# Patient Record
Sex: Male | Born: 1940
Health system: Southern US, Community
[De-identification: ages and names within clinical notes are randomized; demographics above are authoritative.]

## PROBLEM LIST (undated history)

## (undated) DIAGNOSIS — M549 Dorsalgia, unspecified: Secondary | ICD-10-CM

## (undated) DIAGNOSIS — R51 Headache: Secondary | ICD-10-CM

## (undated) DIAGNOSIS — I10 Essential (primary) hypertension: Secondary | ICD-10-CM

## (undated) DIAGNOSIS — F17200 Nicotine dependence, unspecified, uncomplicated: Secondary | ICD-10-CM

## (undated) DIAGNOSIS — T148XXA Other injury of unspecified body region, initial encounter: Secondary | ICD-10-CM

## (undated) DIAGNOSIS — G5600 Carpal tunnel syndrome, unspecified upper limb: Secondary | ICD-10-CM

## (undated) DIAGNOSIS — K4090 Unilateral inguinal hernia, without obstruction or gangrene, not specified as recurrent: Secondary | ICD-10-CM

## (undated) DIAGNOSIS — I639 Cerebral infarction, unspecified: Secondary | ICD-10-CM

## (undated) DIAGNOSIS — C61 Malignant neoplasm of prostate: Secondary | ICD-10-CM

## (undated) DIAGNOSIS — N529 Male erectile dysfunction, unspecified: Secondary | ICD-10-CM

## (undated) DIAGNOSIS — Z86010 Personal history of colonic polyps: Secondary | ICD-10-CM

## (undated) DIAGNOSIS — I2510 Atherosclerotic heart disease of native coronary artery without angina pectoris: Secondary | ICD-10-CM

## (undated) DIAGNOSIS — R972 Elevated prostate specific antigen [PSA]: Secondary | ICD-10-CM

## (undated) HISTORY — DX: Carpal tunnel syndrome, unspecified upper limb: G56.00

## (undated) HISTORY — DX: Essential (primary) hypertension: I10

## (undated) HISTORY — DX: Nicotine dependence, unspecified, uncomplicated: F17.200

## (undated) HISTORY — DX: Elevated prostate specific antigen (PSA): R97.20

## (undated) HISTORY — DX: Headache: R51

## (undated) HISTORY — DX: Atherosclerotic heart disease of native coronary artery without angina pectoris: I25.10

## (undated) HISTORY — DX: Other injury of unspecified body region, initial encounter: T14.8XXA

## (undated) HISTORY — DX: Malignant neoplasm of prostate: C61

## (undated) HISTORY — DX: Personal history of colonic polyps: Z86.010

## (undated) HISTORY — DX: Dorsalgia, unspecified: M54.9

## (undated) HISTORY — DX: Cerebral infarction, unspecified: I63.9

## (undated) HISTORY — PX: HERNIA REPAIR: SHX51

## (undated) HISTORY — DX: Unilateral inguinal hernia, without obstruction or gangrene, not specified as recurrent: K40.90

## (undated) HISTORY — DX: Male erectile dysfunction, unspecified: N52.9

---

## 2004-10-04 ENCOUNTER — Ambulatory Visit: Payer: Self-pay | Admitting: Internal Medicine

## 2006-04-05 ENCOUNTER — Ambulatory Visit: Payer: Self-pay | Admitting: Internal Medicine

## 2006-07-18 ENCOUNTER — Ambulatory Visit: Payer: Self-pay | Admitting: Internal Medicine

## 2006-07-28 ENCOUNTER — Encounter: Payer: Self-pay | Admitting: Internal Medicine

## 2006-07-28 ENCOUNTER — Ambulatory Visit: Payer: Self-pay

## 2006-08-03 ENCOUNTER — Ambulatory Visit: Payer: Self-pay | Admitting: Gastroenterology

## 2006-08-17 ENCOUNTER — Encounter: Payer: Self-pay | Admitting: Internal Medicine

## 2006-08-17 ENCOUNTER — Ambulatory Visit: Payer: Self-pay | Admitting: Gastroenterology

## 2006-08-17 ENCOUNTER — Encounter (INDEPENDENT_AMBULATORY_CARE_PROVIDER_SITE_OTHER): Payer: Self-pay | Admitting: Specialist

## 2006-08-17 LAB — HM COLONOSCOPY

## 2006-10-06 ENCOUNTER — Ambulatory Visit: Payer: Self-pay | Admitting: Internal Medicine

## 2006-11-17 ENCOUNTER — Ambulatory Visit: Payer: Self-pay | Admitting: Internal Medicine

## 2007-01-08 ENCOUNTER — Ambulatory Visit: Payer: Self-pay | Admitting: Internal Medicine

## 2007-04-10 ENCOUNTER — Ambulatory Visit: Payer: Self-pay | Admitting: Internal Medicine

## 2007-05-29 ENCOUNTER — Telehealth (INDEPENDENT_AMBULATORY_CARE_PROVIDER_SITE_OTHER): Payer: Self-pay | Admitting: *Deleted

## 2007-06-08 ENCOUNTER — Encounter: Payer: Self-pay | Admitting: Internal Medicine

## 2007-06-08 DIAGNOSIS — I10 Essential (primary) hypertension: Secondary | ICD-10-CM

## 2007-06-08 DIAGNOSIS — Z860100 Personal history of colon polyps, unspecified: Secondary | ICD-10-CM

## 2007-06-08 HISTORY — DX: Personal history of colon polyps, unspecified: Z86.0100

## 2007-06-08 HISTORY — DX: Essential (primary) hypertension: I10

## 2007-06-08 HISTORY — DX: Personal history of colonic polyps: Z86.010

## 2007-07-11 ENCOUNTER — Ambulatory Visit: Payer: Self-pay | Admitting: Internal Medicine

## 2007-07-11 DIAGNOSIS — R51 Headache: Secondary | ICD-10-CM

## 2007-07-11 DIAGNOSIS — R519 Headache, unspecified: Secondary | ICD-10-CM | POA: Insufficient documentation

## 2007-07-11 DIAGNOSIS — N529 Male erectile dysfunction, unspecified: Secondary | ICD-10-CM

## 2007-07-11 HISTORY — DX: Male erectile dysfunction, unspecified: N52.9

## 2007-07-11 HISTORY — DX: Headache: R51

## 2007-11-12 ENCOUNTER — Ambulatory Visit: Payer: Self-pay | Admitting: Internal Medicine

## 2007-11-12 DIAGNOSIS — I2510 Atherosclerotic heart disease of native coronary artery without angina pectoris: Secondary | ICD-10-CM

## 2007-11-12 HISTORY — DX: Atherosclerotic heart disease of native coronary artery without angina pectoris: I25.10

## 2007-12-03 ENCOUNTER — Emergency Department (HOSPITAL_COMMUNITY): Admission: EM | Admit: 2007-12-03 | Discharge: 2007-12-03 | Payer: Self-pay | Admitting: Emergency Medicine

## 2007-12-03 ENCOUNTER — Telehealth: Payer: Self-pay | Admitting: Internal Medicine

## 2007-12-04 ENCOUNTER — Ambulatory Visit: Payer: Self-pay | Admitting: Internal Medicine

## 2007-12-04 DIAGNOSIS — K4090 Unilateral inguinal hernia, without obstruction or gangrene, not specified as recurrent: Secondary | ICD-10-CM

## 2007-12-04 HISTORY — DX: Unilateral inguinal hernia, without obstruction or gangrene, not specified as recurrent: K40.90

## 2007-12-05 ENCOUNTER — Encounter: Payer: Self-pay | Admitting: Internal Medicine

## 2007-12-05 ENCOUNTER — Encounter (INDEPENDENT_AMBULATORY_CARE_PROVIDER_SITE_OTHER): Payer: Self-pay | Admitting: General Surgery

## 2007-12-06 ENCOUNTER — Inpatient Hospital Stay (HOSPITAL_COMMUNITY): Admission: EM | Admit: 2007-12-06 | Discharge: 2007-12-13 | Payer: Self-pay | Admitting: Emergency Medicine

## 2008-04-24 ENCOUNTER — Encounter: Payer: Self-pay | Admitting: Internal Medicine

## 2008-04-25 ENCOUNTER — Ambulatory Visit: Payer: Self-pay | Admitting: Internal Medicine

## 2008-04-25 LAB — CONVERTED CEMR LAB: PSA: 3.04 ng/mL (ref 0.10–4.00)

## 2008-07-16 ENCOUNTER — Ambulatory Visit: Payer: Self-pay | Admitting: Internal Medicine

## 2008-07-16 DIAGNOSIS — M549 Dorsalgia, unspecified: Secondary | ICD-10-CM

## 2008-07-16 HISTORY — DX: Dorsalgia, unspecified: M54.9

## 2008-10-15 ENCOUNTER — Ambulatory Visit: Payer: Self-pay | Admitting: Internal Medicine

## 2008-10-15 DIAGNOSIS — G5600 Carpal tunnel syndrome, unspecified upper limb: Secondary | ICD-10-CM

## 2008-10-15 HISTORY — DX: Carpal tunnel syndrome, unspecified upper limb: G56.00

## 2009-01-14 ENCOUNTER — Telehealth: Payer: Self-pay | Admitting: Internal Medicine

## 2009-02-03 ENCOUNTER — Ambulatory Visit: Payer: Self-pay | Admitting: Internal Medicine

## 2009-04-30 ENCOUNTER — Encounter: Payer: Self-pay | Admitting: Internal Medicine

## 2009-05-01 ENCOUNTER — Ambulatory Visit: Payer: Self-pay | Admitting: Internal Medicine

## 2009-05-05 ENCOUNTER — Telehealth: Payer: Self-pay | Admitting: Internal Medicine

## 2009-05-05 LAB — CONVERTED CEMR LAB
ALT: 17 units/L (ref 0–53)
BUN: 11 mg/dL (ref 6–23)
Basophils Relative: 0.1 % (ref 0.0–3.0)
CO2: 33 meq/L — ABNORMAL HIGH (ref 19–32)
Chloride: 101 meq/L (ref 96–112)
Cholesterol: 182 mg/dL (ref 0–200)
Creatinine, Ser: 0.9 mg/dL (ref 0.4–1.5)
Eosinophils Absolute: 0.2 10*3/uL (ref 0.0–0.7)
Eosinophils Relative: 3.8 % (ref 0.0–5.0)
HCT: 41.9 % (ref 39.0–52.0)
LDL Cholesterol: 120 mg/dL — ABNORMAL HIGH (ref 0–99)
Lymphs Abs: 1.6 10*3/uL (ref 0.7–4.0)
MCHC: 34.9 g/dL (ref 30.0–36.0)
MCV: 90.6 fL (ref 78.0–100.0)
Monocytes Absolute: 0.4 10*3/uL (ref 0.1–1.0)
PSA: 5.64 ng/mL — ABNORMAL HIGH (ref 0.10–4.00)
Platelets: 175 10*3/uL (ref 150.0–400.0)
Potassium: 3.4 meq/L — ABNORMAL LOW (ref 3.5–5.1)
TSH: 1.2 microintl units/mL (ref 0.35–5.50)
Total Bilirubin: 1.4 mg/dL — ABNORMAL HIGH (ref 0.3–1.2)
Total Protein: 7.2 g/dL (ref 6.0–8.3)
Triglycerides: 95 mg/dL (ref 0.0–149.0)
WBC: 4.4 10*3/uL — ABNORMAL LOW (ref 4.5–10.5)

## 2009-05-06 ENCOUNTER — Encounter: Payer: Self-pay | Admitting: Internal Medicine

## 2009-05-06 DIAGNOSIS — R972 Elevated prostate specific antigen [PSA]: Secondary | ICD-10-CM

## 2009-05-06 HISTORY — DX: Elevated prostate specific antigen (PSA): R97.20

## 2009-05-29 ENCOUNTER — Encounter: Payer: Self-pay | Admitting: Internal Medicine

## 2009-07-09 ENCOUNTER — Encounter: Payer: Self-pay | Admitting: Internal Medicine

## 2009-07-10 ENCOUNTER — Ambulatory Visit (HOSPITAL_COMMUNITY): Admission: RE | Admit: 2009-07-10 | Discharge: 2009-07-10 | Payer: Self-pay | Admitting: Urology

## 2009-08-05 ENCOUNTER — Encounter: Payer: Self-pay | Admitting: Internal Medicine

## 2009-08-11 ENCOUNTER — Ambulatory Visit: Admission: RE | Admit: 2009-08-11 | Discharge: 2009-10-06 | Payer: Self-pay | Admitting: Radiation Oncology

## 2009-08-12 ENCOUNTER — Encounter (INDEPENDENT_AMBULATORY_CARE_PROVIDER_SITE_OTHER): Payer: Self-pay | Admitting: *Deleted

## 2009-09-14 ENCOUNTER — Encounter (INDEPENDENT_AMBULATORY_CARE_PROVIDER_SITE_OTHER): Payer: Self-pay | Admitting: Urology

## 2009-09-14 ENCOUNTER — Inpatient Hospital Stay (HOSPITAL_COMMUNITY): Admission: RE | Admit: 2009-09-14 | Discharge: 2009-09-17 | Payer: Self-pay | Admitting: Urology

## 2009-09-14 DIAGNOSIS — C61 Malignant neoplasm of prostate: Secondary | ICD-10-CM

## 2009-09-14 HISTORY — PX: PROSTATE SURGERY: SHX751

## 2009-09-14 HISTORY — DX: Malignant neoplasm of prostate: C61

## 2009-09-22 ENCOUNTER — Encounter (INDEPENDENT_AMBULATORY_CARE_PROVIDER_SITE_OTHER): Payer: Self-pay | Admitting: *Deleted

## 2009-11-06 ENCOUNTER — Ambulatory Visit: Payer: Self-pay | Admitting: Internal Medicine

## 2009-11-06 DIAGNOSIS — F17200 Nicotine dependence, unspecified, uncomplicated: Secondary | ICD-10-CM | POA: Insufficient documentation

## 2009-11-06 HISTORY — DX: Nicotine dependence, unspecified, uncomplicated: F17.200

## 2009-11-19 ENCOUNTER — Encounter: Payer: Self-pay | Admitting: Internal Medicine

## 2010-03-19 ENCOUNTER — Encounter: Payer: Self-pay | Admitting: Internal Medicine

## 2010-05-19 ENCOUNTER — Ambulatory Visit: Payer: Self-pay | Admitting: Internal Medicine

## 2010-05-19 DIAGNOSIS — C61 Malignant neoplasm of prostate: Secondary | ICD-10-CM

## 2010-05-19 HISTORY — DX: Malignant neoplasm of prostate: C61

## 2010-09-29 ENCOUNTER — Encounter: Payer: Self-pay | Admitting: Internal Medicine

## 2010-11-19 ENCOUNTER — Ambulatory Visit
Admission: RE | Admit: 2010-11-19 | Discharge: 2010-11-19 | Payer: Self-pay | Source: Home / Self Care | Attending: Internal Medicine | Admitting: Internal Medicine

## 2010-11-28 LAB — CONVERTED CEMR LAB
Albumin: 3.9 g/dL (ref 3.5–5.2)
Alkaline Phosphatase: 39 units/L (ref 39–117)
Basophils Absolute: 0 10*3/uL (ref 0.0–0.1)
CO2: 28 meq/L (ref 19–32)
Cholesterol: 175 mg/dL (ref 0–200)
Eosinophils Absolute: 0.2 10*3/uL (ref 0.0–0.7)
Glucose, Bld: 77 mg/dL (ref 70–99)
HCT: 43 % (ref 39.0–52.0)
Hemoglobin: 14.5 g/dL (ref 13.0–17.0)
Lymphs Abs: 1.5 10*3/uL (ref 0.7–4.0)
MCHC: 33.7 g/dL (ref 30.0–36.0)
MCV: 92.3 fL (ref 78.0–100.0)
Monocytes Absolute: 0.5 10*3/uL (ref 0.1–1.0)
Neutro Abs: 3 10*3/uL (ref 1.4–7.7)
Potassium: 3.9 meq/L (ref 3.5–5.1)
RDW: 16 % — ABNORMAL HIGH (ref 11.5–14.6)
Sodium: 136 meq/L (ref 135–145)
Total Protein: 6.8 g/dL (ref 6.0–8.3)

## 2010-11-30 NOTE — Letter (Signed)
Summary: [facility]  [facility]   Imported By: Maryln Gottron 03/30/2010 13:55:56  _____________________________________________________________________  External Attachment:    Type:   Image     Comment:   External Document

## 2010-11-30 NOTE — Letter (Signed)
Summary: [facility]  [facility]   Imported By: Maryln Gottron 10/05/2010 11:16:20  _____________________________________________________________________  External Attachment:    Type:   Image     Comment:   External Document

## 2010-11-30 NOTE — Assessment & Plan Note (Signed)
Summary: PT WILL COME IN FASTING/[facility] RSC BMP OK PER DAUGH CAMMIE/[facility]   Vital Signs:  Patient profile:   70 year old male Height:      72.5 inches Weight:      220 pounds BMI:     29.53 Temp:     98.4 degrees F oral BP sitting:   130 / 80  (right arm) Cuff size:   regular  Vitals Entered By: Duard Brady LPN (May 19, 2010 9:28 AM) CC: cpx- doing well Is Patient Diabetic? No   CC:  cpx- doing well.  History of Present Illness:   70 year old patient who is seen today for a comprehensive evaluation.  Medical problems include a history of LV dysfunction.  He has hypertension, colonic polyps, and a history of ongoing tobacco use.  He has cut his tobacco consumption down considerably after being treated with chanix  earlier in the year.  denies any symptoms of congestive heart failure.  No exertional chest pain.  He has decreased his tobacco use, to just a few cigarettes daily.  Total cessation discussed and encouraged. Here for Medicare AWV:  1.   Risk factors based on Past M, S, F history: patient has a history of LV dysfunction, hypertension, and tobacco use 2.   Physical Activities: no limitations 3.   Depression/mood: no history of depression or mood disorder 4.   Hearing: no hearing deficits 5.   ADL's: remains independent in all aspects of daily living 6.   Fall Risk: low 7.   Home Safety: no problems identified 8.   Height, weight, &visual acuity:height and weight are stable.  No difficulty with visual acuity 9.   Counseling: total cessation of all tobacco products.  Encouraged 10.   Labs ordered based on risk factors: laboratory studies, including PSA will be reviewed 11.           Referral Coordination-followed urology.  This fall 12.           Care Plan- heart healthy, salt restricted diet more regular exercise and modest weight loss.  All encouraged 13.            Cognitive Assessment- alert and oriented, with normal affect   Preventive Screening-Counseling &  Management  Alcohol-Tobacco     Smoking Status: current     Smoking Cessation Counseling: yes  Allergies (verified): No Known Drug Allergies  Past History:  Past Medical History: Reviewed history from 11/06/2009 and no changes required. Colonic polyps, hx of Hypertension ED LVD R inguinal hernia-repaired prostate cancer  Past Surgical History: right inguinal hernia repair- 2009  Colonoscopy October2007 Cardiolite  stress test September 2007 status post radical prostatectomy November 2010 (T1C NO MO)  Family History: Reviewed history from 05/01/2009 and no changes required. details of his father's health unknown.   Mother died age 80, cancer, unclear type  III sisters, one deceased, age 77 cancer, unclear type; another died Ca-leukemia one brother died-trauma and secondary infection  Social History: Reviewed history from 04/25/2008 and no changes required. 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  120/78overweight-appearing.   Head:  Normocephalic and atraumatic without obvious abnormalities. No apparent alopecia or balding. Eyes:  No corneal or conjunctival inflammation noted. EOMI. Perrla. Funduscopic exam benign, without hemorrhages, exudates or papilledema. Vision grossly normal. arcus senilis Ears:  External ear exam shows no significant lesions or deformities.  Otoscopic examination reveals clear canals, tympanic membranes are intact bilaterally without bulging, retraction,  inflammation or discharge. Hearing is grossly normal bilaterally. Nose:  External nasal examination shows no deformity or inflammation. Nasal mucosa are pink and moist without lesions or exudates. Mouth:  Oral mucosa and oropharynx without lesions or exudates.  Teeth in good repair. Neck:  No deformities, masses, or tenderness noted. Chest Wall:  No deformities, masses, tenderness or gynecomastia  noted. Breasts:  No masses or gynecomastia noted Lungs:  Normal respiratory effort, chest expands symmetrically. Lungs are clear to auscultation, no crackles or wheezes. Heart:  Normal rate and regular rhythm. S1 and S2 normal without gallop, murmur, click, rub or other extra sounds. Abdomen:  Bowel sounds positive,abdomen soft and non-tender without masses, organomegaly or hernias noted. Rectal:  No external abnormalities noted. Normal sphincter tone. No rectal masses or tenderness. Genitalia:  Testes bilaterally descended without nodularity, tenderness or masses. No scrotal masses or lesions. No penis lesions or urethral discharge. Prostate:  status post prostatectomy Msk:  No deformity or scoliosis noted of thoracic or lumbar spine.   Pulses:  R and L carotid,radial,femoral,dorsalis pedis and posterior tibial pulses are full and equal bilaterally Extremities:  No clubbing, cyanosis, edema, or deformity noted with normal full range of motion of all joints.   Neurologic:  No cranial nerve deficits noted. Station and gait are normal. Plantar reflexes are down-going bilaterally. DTRs are symmetrical throughout. Sensory, motor and coordinative functions appear intact. Skin:  Intact without suspicious lesions or rashes Cervical Nodes:  No lymphadenopathy noted Axillary Nodes:  No palpable lymphadenopathy Inguinal Nodes:  No significant adenopathy Psych:  Cognition and judgment appear intact. Alert and cooperative with normal attention span and concentration. No apparent delusions, illusions, hallucinations   Impression & Recommendations:  Problem # 1:  Preventive Health Care (ICD-V70.0)  Orders: First annual wellness visit with prevention plan  (U9323)  Problem # 2:  TOBACCO ABUSE (ICD-305.1)  His updated medication list for this problem includes:    Chantix Starting Month Pak 0.5 Mg X 11 & 1 Mg X 42 Tabs (Varenicline tartrate)    Chantix 1 Mg Tabs (Varenicline tartrate) ..... One tablet  twice daily  His updated medication list for this problem includes:    Chantix Starting Month Pak 0.5 Mg X 11 & 1 Mg X 42 Tabs (Varenicline tartrate)    Chantix 1 Mg Tabs (Varenicline tartrate) ..... One tablet twice daily  Problem # 3:  LEFT VENTRICULAR FUNCTION, DECREASED (ICD-429.2)  His updated medication list for this problem includes:    Benazepril Hcl 40 Mg Tabs (Benazepril hcl) .Marland Kitchen... 1 once daily    Chlorthalidone 25 Mg Tabs (Chlorthalidone) .Marland Kitchen... 1 once daily    Toprol Xl 50 Mg Tb24 (Metoprolol succinate) .Marland Kitchen... 1 once daily  His updated medication list for this problem includes:    Benazepril Hcl 40 Mg Tabs (Benazepril hcl) .Marland Kitchen... 1 once daily    Chlorthalidone 25 Mg Tabs (Chlorthalidone) .Marland Kitchen... 1 once daily    Toprol Xl 50 Mg Tb24 (Metoprolol succinate) .Marland Kitchen... 1 once daily  Problem # 4:  HYPERTENSION (ICD-401.9)  His updated medication list for this problem includes:    Benazepril Hcl 40 Mg Tabs (Benazepril hcl) .Marland Kitchen... 1 once daily    Chlorthalidone 25 Mg Tabs (Chlorthalidone) .Marland Kitchen... 1 once daily    Toprol Xl 50 Mg Tb24 (Metoprolol succinate) .Marland Kitchen... 1 once daily  Orders: EKG w/ Interpretation (93000) Venipuncture (55732) TLB-BMP (Basic Metabolic Panel-BMET) (80048-METABOL) TLB-CBC Platelet - w/Differential (85025-CBCD) TLB-Hepatic/Liver Function Pnl (80076-HEPATIC)  His updated medication list for this problem includes:  Benazepril Hcl 40 Mg Tabs (Benazepril hcl) .Marland Kitchen... 1 once daily    Chlorthalidone 25 Mg Tabs (Chlorthalidone) .Marland Kitchen... 1 once daily    Toprol Xl 50 Mg Tb24 (Metoprolol succinate) .Marland Kitchen... 1 once daily  Complete Medication List: 1)  Benazepril Hcl 40 Mg Tabs (Benazepril hcl) .Marland Kitchen.. 1 once daily 2)  Chlorthalidone 25 Mg Tabs (Chlorthalidone) .Marland Kitchen.. 1 once daily 3)  Toprol Xl 50 Mg Tb24 (Metoprolol succinate) .Marland Kitchen.. 1 once daily 4)  Viagra 100 Mg Tabs (Sildenafil citrate) .... One daily as directed 5)  Chantix Starting Month Pak 0.5 Mg X 11 & 1 Mg X 42 Tabs  (Varenicline tartrate) 6)  Chantix 1 Mg Tabs (Varenicline tartrate) .... One tablet twice daily  Other Orders: Specimen Handling (33007) TLB-Lipid Panel (80061-LIPID) TLB-TSH (Thyroid Stimulating Hormone) (84443-TSH) TLB-PSA (Prostate Specific Antigen) (84153-PSA)  Patient Instructions: 1)  Please schedule a follow-up appointment in 6 months. 2)  Limit your Sodium (Salt). 3)  Tobacco is very bad for your health and your loved ones! You Should stop smoking!. 4)  It is important that you exercise regularly at least 20 minutes 5 times a week. If you develop chest pain, have severe difficulty breathing, or feel very tired , stop exercising immediately and seek medical attention. 5)  You need to lose weight. Consider a lower calorie diet and regular exercise.  Prescriptions: VIAGRA 100 MG TABS (SILDENAFIL CITRATE) one daily as directed  #6 x 6   Entered and Authorized by:   Gordy Savers  MD   Signed by:   Gordy Savers  MD on 05/19/2010   Method used:   Electronically to        [facility] [phone]* (retail)       [address]       [place], [place]  [address]       Ph: [phone]       Fax: [phone]   RxID:   [id number] TOPROL XL 50 MG  TB24 (METOPROLOL SUCCINATE) 1 once daily  #90 x 6   Entered and Authorized by:   Gordy Savers  MD   Signed by:   Gordy Savers  MD on 05/19/2010   Method used:   Electronically to        [facility] [phone]* (retail)       [address]       [place], [place]  [address]       Ph: [phone]       Fax: [phone]   RxID:   [id number] CHLORTHALIDONE 25 MG  TABS (CHLORTHALIDONE) 1 once daily  #90 x 6   Entered and Authorized by:   Gordy Savers  MD   Signed by:   Gordy Savers  MD on 05/19/2010   Method used:   Electronically to        [facility] [phone]* (retail)       [address]       [place], [place]  [address]       Ph: [phone]       Fax: [phone]   RxID:    [id number] BENAZEPRIL HCL 40 MG  TABS (BENAZEPRIL HCL) 1 once daily  #90 x 6   Entered and Authorized by:   Gordy Savers  MD   Signed by:   Gordy Savers  MD on 05/19/2010   Method used:   Electronically to        [facility]  [address]* (retail)       [address]       [place], [place]  [address]       Ph: [phone]       Fax: [phone]   RxID:   [id number]

## 2010-11-30 NOTE — Letter (Signed)
Summary: [facility]  [facility]   Imported By: Maryln Gottron 11/27/2009 12:25:18  _____________________________________________________________________  External Attachment:    Type:   Image     Comment:   External Document

## 2010-11-30 NOTE — Assessment & Plan Note (Signed)
Summary: 6 month rov/njr   the  Vital Signs:  Patient profile:   70 year old male Weight:      213 pounds Temp:     98.8 degrees F oral BP sitting:   116 / 68  (left arm) Cuff size:   regular  Vitals Entered By: Raechel Ache, RN (November 06, 2009 10:15 AM) CC: 6 mo ROV- had colon surgery 2 weeks, c/o constipation   CC:  6 mo ROV- had colon surgery 2 weeks and c/o constipation.  History of Present Illness: 70 year old patient who is seen today for follow up.  She has a history of LV dysfunction, hypertension, ongoing tobacco use.  Since his last visit here.  He has had a radical prostatectomy.  His cardiac status has been stable.  His only complaint today is some occasional constipation.  This has a well-controlled with a stool softener.  He denies any cardiopulmonary complaints.  He is agreeable to a smoking cessation program.  Preventive Screening-Counseling & Management  Alcohol-Tobacco     Smoking Cessation Counseling: yes  Allergies: No Known Drug Allergies  Past History:  Past Medical History: Colonic polyps, hx of Hypertension ED LVD R inguinal hernia-repaired prostate cancer  Past Surgical History: right inguinal hernia repair- 2009  Colonoscopy October2007 Cardiolite  stress test September 2007status post radical prostatectomy November 2010 (T1C NO MO)  Review of Systems  The patient denies anorexia, fever, weight loss, weight gain, vision loss, decreased hearing, hoarseness, chest pain, syncope, dyspnea on exertion, peripheral edema, prolonged cough, headaches, hemoptysis, abdominal pain, melena, hematochezia, severe indigestion/heartburn, hematuria, incontinence, genital sores, muscle weakness, suspicious skin lesions, transient blindness, difficulty walking, depression, unusual weight change, abnormal bleeding, enlarged lymph nodes, angioedema, breast masses, and testicular masses.    Physical Exam  General:  Well-developed,well-nourished,in no acute  distress; alert,appropriate and cooperative throughout examination Head:  Normocephalic and atraumatic without obvious abnormalities. No apparent alopecia or balding. Eyes:  No corneal or conjunctival inflammation noted. EOMI. Perrla. Funduscopic exam benign, without hemorrhages, exudates or papilledema. Vision grossly normal. Mouth:  Oral mucosa and oropharynx without lesions or exudates.  Teeth in good repair. Neck:  No deformities, masses, or tenderness noted. Lungs:  Normal respiratory effort, chest expands symmetrically. Lungs are clear to auscultation, no crackles or wheezes. Heart:  Normal rate and regular rhythm. S1 and S2 normal without gallop, murmur, click, rub or other extra sounds. Abdomen:  Bowel sounds positive,abdomen soft and non-tender without masses, organomegaly or hernias noted. Msk:  No deformity or scoliosis noted of thoracic or lumbar spine.   Extremities:   no edema Skin:  Intact without suspicious lesions or rashes Cervical Nodes:  No lymphadenopathy noted   Impression & Recommendations:  Problem # 1:  LEFT VENTRICULAR FUNCTION, DECREASED (ICD-429.2)  His updated medication list for this problem includes:    Benazepril Hcl 40 Mg Tabs (Benazepril hcl) .Marland Kitchen... 1 once daily    Chlorthalidone 25 Mg Tabs (Chlorthalidone) .Marland Kitchen... 1 once daily    Toprol Xl 50 Mg Tb24 (Metoprolol succinate) .Marland Kitchen... 1 once daily    His updated medication list for this problem includes:    Benazepril Hcl 40 Mg Tabs (Benazepril hcl) .Marland Kitchen... 1 once daily    Chlorthalidone 25 Mg Tabs (Chlorthalidone) .Marland Kitchen... 1 once daily    Toprol Xl 50 Mg Tb24 (Metoprolol succinate) .Marland Kitchen... 1 once daily  Orders: Prescription Created Electronically [phone])  Problem # 2:  HYPERTENSION (ICD-401.9)  His updated medication list for this problem includes:  Benazepril Hcl 40 Mg Tabs (Benazepril hcl) .Marland Kitchen... 1 once daily    Chlorthalidone 25 Mg Tabs (Chlorthalidone) .Marland Kitchen... 1 once daily    Toprol Xl 50 Mg Tb24  (Metoprolol succinate) .Marland Kitchen... 1 once daily    His updated medication list for this problem includes:    Benazepril Hcl 40 Mg Tabs (Benazepril hcl) .Marland Kitchen... 1 once daily    Chlorthalidone 25 Mg Tabs (Chlorthalidone) .Marland Kitchen... 1 once daily    Toprol Xl 50 Mg Tb24 (Metoprolol succinate) .Marland Kitchen... 1 once daily  Orders: Prescription Created Electronically [phone])  Problem # 3:  TOBACCO ABUSE (ICD-305.1)  His updated medication list for this problem includes:    Chantix Starting Month Pak 0.5 Mg X 11 & 1 Mg X 42 Tabs (Varenicline tartrate)    Chantix 1 Mg Tabs (Varenicline tartrate) ..... One tablet twice daily  His updated medication list for this problem includes:    Chantix Starting Month Pak 0.5 Mg X 11 & 1 Mg X 42 Tabs (Varenicline tartrate)    Chantix 1 Mg Tabs (Varenicline tartrate) ..... One tablet twice daily  Complete Medication List: 1)  Benazepril Hcl 40 Mg Tabs (Benazepril hcl) .Marland Kitchen.. 1 once daily 2)  Chlorthalidone 25 Mg Tabs (Chlorthalidone) .Marland Kitchen.. 1 once daily 3)  Toprol Xl 50 Mg Tb24 (Metoprolol succinate) .Marland Kitchen.. 1 once daily 4)  Viagra 100 Mg Tabs (Sildenafil citrate) .... One daily as directed 5)  Chantix Starting Month Pak 0.5 Mg X 11 & 1 Mg X 42 Tabs (Varenicline tartrate) 6)  Chantix 1 Mg Tabs (Varenicline tartrate) .... One tablet twice daily  Patient Instructions: 1)  Please schedule a follow-up appointment in 6 months. 2)  Limit your Sodium (Salt). 3)  Tobacco is very bad for your health and your loved ones! You Should stop smoking!. 4)  It is important that you exercise regularly at least 20 minutes 5 times a week. If you develop chest pain, have severe difficulty breathing, or feel very tired , stop exercising immediately and seek medical attention. 5)  You need to lose weight. Consider a lower calorie diet and regular exercise.  Prescriptions: CHANTIX 1 MG TABS (VARENICLINE TARTRATE) one tablet twice daily  #60 x 2   Entered and Authorized by:   Gordy Savers  MD    Signed by:   Gordy Savers  MD on 11/06/2009   Method used:   Electronically to        [facility] [phone]* (retail)       [address]       [place], [place]  [address]       Ph: [phone]       Fax: [phone]   RxID:   [id number] CHANTIX STARTING MONTH PAK 0.5 MG X 11 & 1 MG X 42 TABS (VARENICLINE TARTRATE)   #one month x 0   Entered and Authorized by:   Gordy Savers  MD   Signed by:   Gordy Savers  MD on 11/06/2009   Method used:   Print then Give to Patient   RxID:   [id number] TOPROL XL 50 MG  TB24 (METOPROLOL SUCCINATE) 1 once daily  #90 x 6   Entered and Authorized by:   Gordy Savers  MD   Signed by:   Gordy Savers  MD on 11/06/2009   Method used:   Electronically to        [facility] [phone]* (retail)       [address]       [place], [place]  [address]       Ph: [phone]       Fax: [phone]   RxID:   [id number] CHLORTHALIDONE 25 MG  TABS (CHLORTHALIDONE) 1 once daily  #90 x 6   Entered and Authorized by:   Gordy Savers  MD   Signed by:   Gordy Savers  MD on 11/06/2009   Method used:   Electronically to        [facility] [phone]* (retail)       [address]       [place], [place]  [address]       Ph: [phone]       Fax: [phone]   RxID:   [id number] BENAZEPRIL HCL 40 MG  TABS (BENAZEPRIL HCL) 1 once daily  #90 x 6   Entered and Authorized by:   Gordy Savers  MD   Signed by:   Gordy Savers  MD on 11/06/2009   Method used:   Electronically to        [facility] [phone]* (retail)       [address]       [place], [place]  [address]       Ph: [phone]       Fax: [phone]   RxID:   [id number] CHANTIX 1 MG TABS (VARENICLINE TARTRATE) one tablet twice daily  #60 x 2   Entered and Authorized by:   Gordy Savers  MD   Signed by:   Gordy Savers  MD on 11/06/2009   Method used:   Print then Give to Patient   RxID:    [id number] CHANTIX STARTING MONTH PAK 0.5 MG X 11 & 1 MG X 42 TABS (VARENICLINE TARTRATE)   #one month x 0   Entered and Authorized by:   Gordy Savers  MD   Signed by:   Gordy Savers  MD on 11/06/2009   Method used:   Print then Give to Patient   RxID:   [id number] TOPROL XL 50 MG  TB24 (METOPROLOL SUCCINATE) 1 once daily  #90 x 6   Entered and Authorized by:   Gordy Savers  MD   Signed by:   Gordy Savers  MD on 11/06/2009   Method used:   Print then Give to Patient   RxID:   [id number] CHLORTHALIDONE 25 MG  TABS (CHLORTHALIDONE) 1 once daily  #90 x 6   Entered and Authorized by:   Gordy Savers  MD   Signed by:   Gordy Savers  MD on 11/06/2009   Method used:   Print then Give to Patient   RxID:   [id number] BENAZEPRIL HCL 40 MG  TABS (BENAZEPRIL HCL) 1 once daily  #90 x 6   Entered and Authorized by:   Gordy Savers  MD   Signed by:   Gordy Savers  MD on 11/06/2009   Method used:   Print then Give to Patient   RxID:   [id number]

## 2010-12-02 NOTE — Assessment & Plan Note (Signed)
Summary: 6 month follow up/cjr   Vital Signs:  Patient profile:   70 year old male Weight:      221 pounds Temp:     98.1 degrees F oral BP sitting:   130 / 80  (right arm) Cuff size:   regular  Vitals Entered By: Duard Brady LPN (November 19, 2010 9:48 AM) CC: 6 mos rov - doing well Is Patient Diabetic? No   CC:  6 mos rov - doing well.  History of Present Illness: 65 -year-old patient who is seen today for follow-up of hypertension.  he does well on triple therapy.  He tolerates his medications well and has had nice blood pressure control.  He is seen today for his 6 month follow-up unfortunately, he continues to smoke.  This was discussed at length, and he seems motivated to discontinue.  Preventive Screening-Counseling & Management  Alcohol-Tobacco     Smoking Status: current  Allergies (verified): No Known Drug Allergies  Past History:  Past Medical History: Reviewed history from 11/06/2009 and no changes required. Colonic polyps, hx of Hypertension ED LVD R inguinal hernia-repaired prostate cancer  Past Surgical History: Reviewed history from 05/19/2010 and no changes required. right inguinal hernia repair- 2009  Colonoscopy October2007 Cardiolite  stress test September 2007 status post radical prostatectomy November 2010 (T1C NO MO)  Family History: Reviewed history from 05/01/2009 and no changes required. details of his father's health unknown.   Mother died age 59, cancer, unclear type  III sisters, one deceased, age 25 cancer, unclear type; another died Ca-leukemia one brother died-trauma and secondary infection  Social History: Reviewed history from 04/25/2008 and no changes required. 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    Physical Exam  General:  overweight-appearing.  130/80overweight-appearing.   Head:  Normocephalic and atraumatic without obvious abnormalities. No apparent alopecia or balding. Eyes:  No corneal or conjunctival inflammation noted. EOMI. Perrla. Funduscopic exam benign, without hemorrhages, exudates or papilledema. Vision grossly normal. Mouth:  Oral mucosa and oropharynx without lesions or exudates.  Teeth in good repair. Neck:  No deformities, masses, or tenderness noted. Lungs:  Normal respiratory effort, chest expands symmetrically. Lungs are clear to auscultation, no crackles or wheezes. Heart:  Normal rate and regular rhythm. S1 and S2 normal without gallop, murmur, click, rub or other extra sounds. Abdomen:  Bowel sounds positive,abdomen soft and non-tender without masses, organomegaly or hernias noted. Msk:  No deformity or scoliosis noted of thoracic or lumbar spine.   Pulses:  R and L carotid,radial,femoral,dorsalis pedis and posterior tibial pulses are full and equal bilaterally Extremities:  No clubbing, cyanosis, edema, or deformity noted with normal full range of motion of all joints.   Skin:  Intact without suspicious lesions or rashes Cervical Nodes:  No lymphadenopathy noted   Impression & Recommendations:  Problem # 1:  TOBACCO ABUSE (ICD-305.1)  The following medications were removed from the medication list:    Chantix Starting Month Pak 0.5 Mg X 11 & 1 Mg X 42 Tabs (Varenicline tartrate)    Chantix 1 Mg Tabs (Varenicline tartrate) ..... One tablet  twice daily  The following medications were removed from the  medication list:    Chantix Starting Month Pak 0.5 Mg X 11 & 1 Mg X 42 Tabs (Varenicline tartrate)    Chantix 1 Mg Tabs (Varenicline tartrate) ..... One tablet twice daily  Problem # 2:  HYPERTENSION (ICD-401.9)  His updated medication list for this problem includes:    Benazepril Hcl 40 Mg Tabs (Benazepril hcl) .Marland Kitchen... 1 once daily    Chlorthalidone 25 Mg Tabs (Chlorthalidone) .Marland Kitchen... 1 once daily    Toprol Xl 50 Mg Tb24 (Metoprolol succinate) .Marland Kitchen... 1 once daily  His updated medication list for this problem includes:    Benazepril Hcl 40 Mg Tabs (Benazepril hcl) .Marland Kitchen... 1 once daily    Chlorthalidone 25 Mg Tabs (Chlorthalidone) .Marland Kitchen... 1 once daily    Toprol Xl 50 Mg Tb24 (Metoprolol succinate) .Marland Kitchen... 1 once daily  Complete Medication List: 1)  Benazepril Hcl 40 Mg Tabs (Benazepril hcl) .Marland Kitchen.. 1 once daily 2)  Chlorthalidone 25 Mg Tabs (Chlorthalidone) .Marland Kitchen.. 1 once daily 3)  Toprol Xl 50 Mg Tb24 (Metoprolol succinate) .Marland Kitchen.. 1 once daily 4)  Viagra 100 Mg Tabs (Sildenafil citrate) .... One daily as directed  Patient Instructions: 1)  Please schedule a follow-up appointment in 6 months. 2)  Advised not to eat any food or drink any liquids after 10 PM the night before your procedure. 3)  Limit your Sodium (Salt) to less than 2 grams a day(slightly less than 1/2 a teaspoon) to prevent fluid retention, swelling, or worsening of symptoms. 4)  It is important that you exercise regularly at least 20 minutes 5 times a week. If you develop chest pain, have severe difficulty breathing, or feel very tired , stop exercising immediately and seek medical attention. 5)  You need to lose weight. Consider a lower calorie diet and regular exercise.  6)  Check your Blood Pressure regularly. If it is above:   150/90 consistently, you should make an appointment. Prescriptions: VIAGRA 100 MG TABS (SILDENAFIL CITRATE) one daily as directed  #6 x 6   Entered and Authorized by:   Gordy Savers  MD   Signed by:    Gordy Savers  MD on 11/19/2010   Method used:   Electronically to        [facility] [phone]* (retail)       [address]       [place], [place]  [address]       Ph: [phone]       Fax: [phone]   RxID:   [id number] TOPROL XL 50 MG  TB24 (METOPROLOL SUCCINATE) 1 once daily  #90 x 6   Entered and Authorized by:   Gordy Savers  MD   Signed by:   Gordy Savers  MD on 11/19/2010   Method used:   Electronically to        [facility] [phone]* (retail)       [address]       [place], [place]  [address]       Ph: [phone]       Fax: [phone]   RxID:   [id number] CHLORTHALIDONE 25 MG  TABS (CHLORTHALIDONE) 1 once daily  #90 x 6   Entered and Authorized by:   Gordy Savers  MD   Signed by:   Gordy Savers  MD on 11/19/2010   Method used:   Electronically to  [facility] [address] [phone]* (retail)       [address]       [place], [place]  [address]       Ph: [phone]       Fax: [phone]   RxID:   [id number] BENAZEPRIL HCL 40 MG  TABS (BENAZEPRIL HCL) 1 once daily  #90 x 6   Entered and Authorized by:   Gordy Savers  MD   Signed by:   Gordy Savers  MD on 11/19/2010   Method used:   Electronically to        [facility] [phone]* (retail)       [address]       [place], [place]  [address]       Ph: [phone]       Fax: [phone]   RxID:   [id number]    Orders Added: 1)  Est. Patient Level III [[address]]

## 2011-02-02 LAB — HEMOGLOBIN AND HEMATOCRIT, BLOOD
HCT: 39.1 % (ref 39.0–52.0)
Hemoglobin: 12.9 g/dL — ABNORMAL LOW (ref 13.0–17.0)

## 2011-02-02 LAB — TYPE AND SCREEN

## 2011-02-02 LAB — BASIC METABOLIC PANEL
CO2: 27 mEq/L (ref 19–32)
CO2: 27 mEq/L (ref 19–32)
Calcium: 8.3 mg/dL — ABNORMAL LOW (ref 8.4–10.5)
Chloride: 99 mEq/L (ref 96–112)
Chloride: 100 mEq/L (ref 96–112)
Creatinine, Ser: 1.46 mg/dL (ref 0.4–1.5)
GFR calc Af Amer: 58 mL/min — ABNORMAL LOW (ref >60)
GFR calc Af Amer: >60 mL/min (ref >60)
Glucose, Bld: 123 mg/dL — ABNORMAL HIGH (ref 70–99)
Potassium: 3.4 mEq/L — ABNORMAL LOW (ref 3.5–5.1)
Sodium: 134 mEq/L — ABNORMAL LOW (ref 135–145)
Sodium: 136 mEq/L (ref 135–145)
Sodium: 139 mEq/L (ref 135–145)

## 2011-02-02 LAB — CBC
HCT: 46.2 % (ref 39.0–52.0)
Hemoglobin: 15.7 g/dL (ref 13.0–17.0)
MCV: 90.9 fL (ref 78.0–100.0)
RBC: 5.08 MIL/uL (ref 4.22–5.81)
WBC: 4.9 10*3/uL (ref 4.0–10.5)

## 2011-02-02 LAB — CREATININE, FLUID (PLEURAL, PERITONEAL, JP DRAINAGE)

## 2011-02-08 ENCOUNTER — Encounter: Payer: Self-pay | Admitting: Internal Medicine

## 2011-02-09 ENCOUNTER — Ambulatory Visit (INDEPENDENT_AMBULATORY_CARE_PROVIDER_SITE_OTHER): Payer: Medicare Other | Admitting: Internal Medicine

## 2011-02-09 ENCOUNTER — Encounter: Payer: Self-pay | Admitting: Internal Medicine

## 2011-02-09 VITALS — BP 130/88 | Temp 98.1°F | Wt 226.0 lb

## 2011-02-09 DIAGNOSIS — M25519 Pain in unspecified shoulder: Secondary | ICD-10-CM

## 2011-02-09 DIAGNOSIS — I10 Essential (primary) hypertension: Secondary | ICD-10-CM

## 2011-02-09 DIAGNOSIS — M25511 Pain in right shoulder: Secondary | ICD-10-CM

## 2011-02-09 DIAGNOSIS — M549 Dorsalgia, unspecified: Secondary | ICD-10-CM

## 2011-02-09 MED ORDER — TRAMADOL HCL 50 MG PO TABS: 50.0000 mg | ORAL_TABLET | Freq: Four times a day (QID) | ORAL | Status: DC | PRN

## 2011-02-09 MED ORDER — METHYLPREDNISOLONE ACETATE 80 MG/ML IJ SUSP
80.0000 mg | Freq: Once | INTRAMUSCULAR | Status: AC
  Administered 2011-02-09: 80 mg via INTRAMUSCULAR

## 2011-02-09 NOTE — Progress Notes (Signed)
  Subjective:    Patient ID: Tommy Bauer, male    DOB: June 18, 1941, 70 y.o.   MRN: [id number]  HPI  70 year old patient who presents with a chief complaint of right shoulder pain. This has been present for approximately one week. 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 He also describes some numbness of both hands. His main complaint is shoulder pain and inability to raise his hand up over the shoulder region there's been no trauma except for his overuse   Review of Systems  Musculoskeletal: Positive for back pain.       Objective:   Physical Exam  Musculoskeletal:       Slight tenderness over the anterior right shoulder region. Was not able to abduct the shoulder past 90 on the right without discomfort          Assessment & Plan:  Right shoulder pain. Probable shoulder bursitis. Will treat with 80 of Depo-Medrol and observed. He was given samples of Ultram when necessary pain

## 2011-02-09 NOTE — Patient Instructions (Signed)
Take Aleve 200 mg twice daily for pain or swelling  Call or return to clinic prn if these symptoms worsen or fail to improve as anticipated.  

## 2011-03-15 NOTE — H&P (Signed)
NAME:  HADES, MATHEW NO.:  [id number]   MEDICAL RECORD NO.:  [id number]          PATIENT TYPE:  INP   LOCATION:  1605                         FACILITY:  [facility]   PHYSICIAN:  Anselm Pancoast. Weatherly, M.D.DATE OF BIRTH:  12/15/1940   DATE OF ADMISSION:  12/05/2007  DATE OF DISCHARGE:                              HISTORY & PHYSICAL   CHIEF COMPLAINT:  Right inguinal hernia, nausea and vomiting.   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  He  was seen in the emergency room, and the hernia was  supposedly reduced  by the ER physician, and the patient given [facility]'s  number to call for an appointment.  He has had a known right inguinal  hernia for approximately 5 years, and was supposed to get this fixed.  He sees Dr. Eleonore Chiquito for his chronic care, mild hypertension,  and the hernia basically has not been that symptomatic and nothing was  recommended.   He started having pain on Monday, and went to the [facility]  Emergency Room, and was seen by the ER physician or PA; and the patient  states that he felt better when he  originally went home.  He was given  some pain medication, and he call for an appointment.  He started having  problems with increasing pain and distention the following day, and it  appears that when he was seen in the emergency room his vital signs were  stable.  O2 saturation was fine, and it was thought that the hernia was  reduced.  No x-rays were obtained and no lab studies, I do not think,  were obtained.  He was given Hydrocodone and a followup appointment with  his regular medical doctor, it says, and then also an appointment with  [facility].   The patient has had nausea and vomiting, and was seen in the urgent  office today by Dr. Corliss Skains, who saw the patient; and I called me, and the  patient  directly came to the emergency room here at [facility].  He had flat and upright abdominal films that show a massive  dilated stomach, and very large loops of small bowel with mechanical  small-bowel obstruction.  NG tube was placed with 1-1/2 canisters of  very thick foul-smelling gastric juice.  He says that he has really not  been able to eat anything over the last 24 hours.  His laboratory  studies are pending, at this moment.   His abdomen is distended, but it is not rigid like he has a perforation.  There is a chronically incarcerated hernia in the right groin that is  fairly large.  I do not appreciate a weakness on the left.  He has not  had a bowel movement now for 48 hours.   CHRONIC MEDICATIONS:  1. He is on Toprol XL 100 mcg a day.  2. Benzoin.  3. Hydrochlorothiazide 25 mg a daily.  4. Vicodin for  pain.   FAMILY HISTORY:  His sister has cancer.  His mother has had lung cancer.  The patient is retired Music therapist.  The only significant history is that  of the known inguinal hernia, but elected not to have anything  surgically corrected.   IMPRESSION:  Incarcerated right inguinal hernia with mechanical small-  bowel obstruction.  Hopefully this is not an infarcted small bowel.  The  patient is being decompressed with a NG tube at this moment, an IV fluid  bolus, and  we will get him ready for the operating room in probably approximately  an hour.  We will give him Zosyn for the pain.  The patient is aware  that hopefully we can repair the hernia through the right groin incision  only, but if there is any question of infarction of the small-bowel he  will need a midline incision.           ______________________________  Anselm Pancoast. Zachery Dakins, M.D.     WJW/MEDQ  D:  12/05/2007  T:  12/06/2007  Job:  [id number]   cc:   Gordy Savers, MD  [address] [place]  [place] [address]

## 2011-03-15 NOTE — Op Note (Signed)
NAME:  [name], [name] NO.:  [id number]   MEDICAL RECORD NO.:  [id number]          PATIENT TYPE:  INP   LOCATION:  1605                         FACILITY:  [facility]   PHYSICIAN:  Anselm Pancoast. Weatherly, M.D.DATE OF BIRTH:  02-17-1941   DATE OF PROCEDURE:  12/05/2007  DATE OF DISCHARGE:                               OPERATIVE REPORT   PREOPERATIVE DIAGNOSIS:  Incarcerated right inguinal hernia with bowel  obstruction.   POSTOPERATIVE DIAGNOSIS:  Incarcerated right inguinal hernia with bowel  obstruction.   OPERATION:  Right inguinal hernia repair with reduction of incarcerated  hernia.   ANESTHESIA:  General anesthesia.   SURGEON:  Anselm Pancoast. Zachery Dakins, M.D.   ASSISTANT:  Nurse.   HISTORY:  Tommy Bauer is a 70 year old fairly large male who was sent to  our office today with the following history.  He is a Product manager, has been retired for a couple years, is 70 years old and has had  a large right inguinal hernia for about five years. He sees Dr. Eleonore Chiquito for high blood pressure management and Dr. Amador Cunas had  suggested that he have his hernia repaired, he had not.  Then, on  Monday, he started having pain in the right hernia, went to the [facility]  emergency room, and was seen by the ER physician.  No lab studies were  obtained.  They were able to, they thought, reduce the hernia and  suggested he see Dr. Amador Cunas and also was given our name to be seen  in our office, general surgery.  He did not go yesterday but was having  significant problems with nausea and vomiting.  He did show up in the  office today, vomited when he first got there, and was seen by Dr. Corliss Skains  who called and sent him over to the emergency room where I met the  patient.  At first, he had an acute abdominal series that showed a very  dilated small bowel, a lot of fluid in the stomach, I placed an NG tube  and promptly got 1 1/2 canisters of very thick NG drainage.  The  patient  had a fairly large size mass in the right inguinal hernia that was  mildly tender, it was not like it was horrible pain, but since he was  obstructed and this had been going on, I did not try to reduce it  forcibly.  I started him on IV fluids, gave him a liter of fluid, got  laboratory studies and his BUN is 50, his creatinine is approximately 3,  and his blood pressure in the emergency room was 100/55.  His pulse  originally was 100 and he was given 3.75 grams of the Zosyn and then  added onto the OR schedule as soon as an OR room was available. His  electrolytes were normal.  His potassium was 3.4.   DESCRIPTION OF PROCEDURE:  He was taken to the operative suite.  There  was some confusion about the way the permit had been filled out, it said  repair of right inguinal hernia with  bowel obstruction and it was on the  line for the indications for and not what the surgery was going to be  and there was some confusion among the nurses and they got whoever had  filled out the form to make the amended.  The patient understands that  we are planning on repairing the right inguinal hernia, if there was any  question and he got damaged bowel, he understands that we will do a  laparotomy also, but hopefully it can all be repaired through the right  groin incision.  The patient was taken back to the operative suite.  Induction of general anesthesia, endotracheal tube was placed. The NG,  of course, was working satisfactorily and then the first thing we did  was place a Foley catheter that when into his bladder well, the urinary  is thick or was concentrated, and then it appeared that with replacing  the Foley, the bulge was a little less.  He was then prepped with  Betadine solution after clipping the hair at the midline incision and  groin area and draped in a sterile manner.   At this time, the actual bulge was definitely is a little smaller than  it had been in the emergency room  and I made a groin inguinal incision.  Sharp dissection down through the skin and subcutaneous tissue.  I  identified the external oblique and opened it laterally.  Then, you  could see this large hernia sac that was fluid within it.  I carefully  dissected, there is a little bit of engorgement of the vein going down  to the testicle, but I opened the hernia sac very carefully and there  was not bloody fluid and there was not any foul smelling bowel contents  or anything and the intestine had reduced when we put the patient  asleep.  The hernia sac was a large kind of an indirect hernia sac, very  broad neck, with a good portion of the bladder kind of incorporated in  this where this had been chronically present, I am sure, for a few  years.  I carefully dissected this little finger of bladder that was so  adherent so it could be placed back in its normal anatomical position.  The cord structures had been elevated with a Penrose drain, the ileal  nerve was protected, and then the hernia sac was closed with a running 2-  0 Vicryl very carefully to close the sac and not compromise the bladder.  There was a little area where I dissected on the bladder that I could  recognize a little bit of tissue that looked like bladder wall was not  in the bladder and I approximated that with about 4-5 sutures of 3-0  Vicryl, it is on the little area that is extraperitoneal.  Next, I freed  up the lateral edge of the rectus and the inguinal ligament area  inferior and then I used a 2-0 Prolene and closed the floor in kind of a  modified Shouldice repair, recreated the internal ring, then went back  and tied the two ends together with running 2-0 Prolene.  Next, a piece  of Prolene mesh shaped like a sail slit laterally was used to reinforce  the floor.  The inferior limb was sutured to the edge of the inguinal  ligament. The flap, superiorly, was sutured with interrupted and I went  around the cord  structures for reinforcement and made sure that I did  not  compromise the cord structures because everything was edematous.  The two tails had been sutured together laterally and the mesh is lying  without excessive tension.  I did not put any numbing medicine in the  floor because of this chronic incarceration.  I then closed the external  oblique with a running 2-0 Vicryl and then Scarpa's fascia was closed  with interrupted 3-0 Vicryl.  Subcuticular 4-0 Vicryl and Benzoin Steri-  Strips on skin.  The testicle was in its normal position.  Hopefully, he  will not get much testicular swelling, and I am going to leave the Foley  and NG tube.  I could not hear any bowel sounds at the completion of  surgery. His white count had been normal preoperatively and I will  examine him in the morning.  If there is any question, I will get an x-  ray or possibly a CAT scan if there is any question of this area that  was incarcerated being ischemic, it certainly went in well and with no  fluid, I think it is unlikely with a normal white count.           ______________________________  Anselm Pancoast. Zachery Dakins, M.D.     WJW/MEDQ  D:  12/05/2007  T:  12/06/2007  Job:  [id number]   cc:   Gordy Savers, MD  [address] [place]  [place] [address]

## 2011-03-15 NOTE — Discharge Summary (Signed)
NAME:  Tommy Bauer, Tommy Bauer NO.:  [id number]   MEDICAL RECORD NO.:  [id number]          PATIENT TYPE:  INP   LOCATION:  1605                         FACILITY:  [facility]   PHYSICIAN:  Anselm Pancoast. Weatherly, M.D.DATE OF BIRTH:  [birth year]   DATE OF ADMISSION:  12/05/2007  DATE OF DISCHARGE:  12/13/2007                               DISCHARGE SUMMARY   __________ gave him the name of [facility] and told him to  call and get seen sometime in the future.  Over the next 24 hours the  hernia became incarcerated.  He became more distended, started having  nausea and vomiting, and presented to the urgent office on December 05, 2007.  Was seen by Dr. Corliss Skains.  I was on call for general surgery here at  [facility].  Dr. Corliss Skains called  after seeing him and had the patient  sent over.  X-rays showed a markedly distended small bowel with air  fluid levels with apparently gas in the  colon, and an NG tube was  placed.  Two canister pills immediately of small bowel contents and  patient was started on IV fluids.  He was significantly dehydrated with  his BUN I think was about 50 originally.  I gave him a fluid bolus,  started on Zosyn, and notified the OR and soon as OR time permitted he  was taken over to surgery that evening of the day of admission, the 4th.  He had a very large right inguinal hernia at the bladder.  It was kind  of in the floor of this large indirect hernia sac.  We placed a Foley  catheter in the hernia; it kind of partially reduced with induction of  general anesthesia and placing the Foley, and then the hernia was  repaired.  A large sac was removed.  I could feel up in the peritoneal  cavity, and could not see anything that looked like damaged small bowel  or any bloody fluid in the abdomen and elected just to kind of examine  the bowel right at the hernia area through the large hernia sac and not  make a midline incision in addition to the herniorrhaphy.   The hernia  was repaired with mesh, and postoperatively the following morning his  BUN had come down to 40.  His potassium was 2.8.  He had potassium in  his IV fluids.  This was increased, and his hematocrit was 3400.  He  continued having a large amount of NG drainage for about three days.  We  got serial flat and upright abdominal films, and the first couple of  days really could not see much improvement, then you could see a little  bit of gas going into the colon, and then he started having just kind of  scant bowel function on about the third postoperative day.  The question  was whether there was any damage to the small bowel that would possibly  need an exploration, and his white count remained normal.  He was  essentially afebrile, little bit of fever.  We  encouraged him to  ambulate, walk the hallway, etc.  On Sunday which was about the fifth  day after surgery there was obviously improvement with gas now in the  colon, and he had had one or two bowel movements.  I got a CT or Monday  since it was then five days after surgery, and he was still having a lot  of dilated loops of small bowel, etc.  This CT, however, showed the  repaired hernia with changes postoperative in the right lower quadrant.  There was no evidence of any obviously ischemic bowel, and the contrast  was trickling on into the colon.  The following day I removed the NG  tube and started on a liquid diet, and that has been advanced over the  next 48 hours to a regular diet which he is tolerating well.  He is not  anemic.  He has not had any blood in his stool, and I think we can  discharge him at this time to resume his hypertensive medications and  also he will have Vicodin for pain.  His incision is healing without  problems.  He was on Zosyn for about five days which has been  discontinued, and his white blood count yesterday which was basically  the seventh day after surgery was normal.  His testicle is not  swollen,  and he is up and ambulating without any problems.  We will see him back  to the office in approximately 10 days.  He is retired at this time so  there is no need for returning to work, but he will refrain from doing  any strenuous lifting, etc., for at least 3-4 weeks.  If he is  continuing to have any kind of bloating, cramping, he might need a  follow-up examination of his colon, but I am sure that this was just  basically a chronic incarcerated right inguinal hernia that probably had  cecum and kind of terminal ileum in the hernia because of its enormous  size.   He will resume his chronic medications which are Toprol XL 100 mcg  daily, chlorothiazine, and Benazepril 40 mg a day.  His blood pressure  has not been elevated during this hospitalization.  I am sure with the  regular diet, etc., his need for that will return.           ______________________________  Anselm Pancoast. Zachery Dakins, M.D.     WJW/MEDQ  D:  12/13/2007  T:  12/14/2007  Job:  [id number]

## 2011-03-18 NOTE — Assessment & Plan Note (Signed)
[facility] OFFICE NOTE   Tommy Bauer, Tommy Bauer                          MRN:          [id number]  DATE:07/18/2006                            DOB:          May 24, 1941    A 70 year old gentleman seen today for a comprehensive exam.  He has history  of hypertension, erectile dysfunction, ongoing tobacco use.  He is doing  well today.  No concerns or complaints.   PAST MEDICAL HISTORY:  Fairly unremarkable.  He does have a history of a  right inguinal hernia that is mildly symptomatic.  He has had no prior  hospital admissions.   He continues to be a one pack-per-day smoker.   REVIEW OF SYSTEMS:  Negative.  No screening colonoscopies.   FAMILY HISTORY:  Details of father's health is unknown.  Mother died at 65  of cancer, unclear type.  Three sisters, one deceased of cancer at 2,  unclear type.   PHYSICAL EXAMINATION:  GENERAL APPEARANCE:  A well-developed, fit-appearing  black male in no acute distress.  He appeared younger than his stated age.  VITAL SIGNS:  Blood pressure 160/90.  HEENT:  Mild arcus senilis.  ENT otherwise normal.  NECK:  No bruits.  CHEST:  Clear.  CARDIOVASCULAR:  Normal heart sounds.  No murmurs.  ABDOMEN:  Benign.  GU:  External genitalia normal.  Did have a right inguinal hernia.  EXTREMITIES:  No edema.  RECTAL:  Prostate minimally enlarged.  Stool heme-negative.   EKG was reviewed.  This revealed diffuse anterior T-wave changes, borderline  first degree AV block, LVH.   DISPOSITION:  The patient was set up for a stress Myoview, colonoscopy,  laboratory screen.  Hydrochlorothiazide will be discontinued.  He will be  placed on benazepril/hydrochlorothiazide combination.  Blood pressure will  be rechecked in six weeks.                                   Gordy Savers, MD   PFK/MedQ  DD:  07/18/2006  DT:  07/19/2006  Job #:  [id number]

## 2011-05-20 ENCOUNTER — Other Ambulatory Visit: Payer: Self-pay | Admitting: Internal Medicine

## 2011-05-20 ENCOUNTER — Encounter: Payer: Self-pay | Admitting: Internal Medicine

## 2011-05-20 ENCOUNTER — Ambulatory Visit (INDEPENDENT_AMBULATORY_CARE_PROVIDER_SITE_OTHER): Payer: Medicare Other | Admitting: Internal Medicine

## 2011-05-20 DIAGNOSIS — I2510 Atherosclerotic heart disease of native coronary artery without angina pectoris: Secondary | ICD-10-CM

## 2011-05-20 DIAGNOSIS — Z23 Encounter for immunization: Secondary | ICD-10-CM

## 2011-05-20 DIAGNOSIS — Z86010 Personal history of colonic polyps: Secondary | ICD-10-CM

## 2011-05-20 DIAGNOSIS — I519 Heart disease, unspecified: Secondary | ICD-10-CM

## 2011-05-20 DIAGNOSIS — R972 Elevated prostate specific antigen [PSA]: Secondary | ICD-10-CM

## 2011-05-20 DIAGNOSIS — Z0000 Encounter for general adult medical examination without abnormal findings: Secondary | ICD-10-CM

## 2011-05-20 DIAGNOSIS — C61 Malignant neoplasm of prostate: Secondary | ICD-10-CM

## 2011-05-20 DIAGNOSIS — G5600 Carpal tunnel syndrome, unspecified upper limb: Secondary | ICD-10-CM

## 2011-05-20 DIAGNOSIS — F17200 Nicotine dependence, unspecified, uncomplicated: Secondary | ICD-10-CM

## 2011-05-20 DIAGNOSIS — I10 Essential (primary) hypertension: Secondary | ICD-10-CM

## 2011-05-20 DIAGNOSIS — K4090 Unilateral inguinal hernia, without obstruction or gangrene, not specified as recurrent: Secondary | ICD-10-CM

## 2011-05-20 LAB — CBC WITH DIFFERENTIAL/PLATELET
Eosinophils Absolute: 0.3 10*3/uL (ref 0.0–0.7)
HCT: 44.3 % (ref 39.0–52.0)
Lymphs Abs: 1.8 10*3/uL (ref 0.7–4.0)
MCHC: 33.5 g/dL (ref 30.0–36.0)
MCV: 92.3 fl (ref 78.0–100.0)
Monocytes Absolute: 0.6 10*3/uL (ref 0.1–1.0)
Neutrophils Relative %: 45.1 % (ref 43.0–77.0)
Platelets: 212 10*3/uL (ref 150.0–400.0)

## 2011-05-20 LAB — BASIC METABOLIC PANEL
BUN: 13 mg/dL (ref 6–23)
Calcium: 9.7 mg/dL (ref 8.4–10.5)
GFR: 81.6 mL/min (ref >60.00)
Glucose, Bld: 75 mg/dL (ref 70–99)
Potassium: 3.1 mEq/L — ABNORMAL LOW (ref 3.5–5.1)
Sodium: 142 mEq/L (ref 135–145)

## 2011-05-20 LAB — LIPID PANEL
Cholesterol: 186 mg/dL (ref 0–200)
HDL: 46.9 mg/dL (ref >39.00)
LDL Cholesterol: 121 mg/dL — ABNORMAL HIGH (ref 0–99)
Total CHOL/HDL Ratio: 4
Triglycerides: 91 mg/dL (ref 0.0–149.0)
VLDL: 18.2 mg/dL (ref 0.0–40.0)

## 2011-05-20 MED ORDER — METOPROLOL SUCCINATE ER 50 MG PO TB24: 50.0000 mg | ORAL_TABLET | Freq: Every day | ORAL | Status: DC

## 2011-05-20 MED ORDER — BENAZEPRIL HCL 40 MG PO TABS: 40.0000 mg | ORAL_TABLET | Freq: Every day | ORAL | Status: DC

## 2011-05-20 MED ORDER — CHLORTHALIDONE 25 MG PO TABS: 25.0000 mg | ORAL_TABLET | Freq: Every day | ORAL | Status: DC

## 2011-05-20 MED ORDER — POTASSIUM CHLORIDE CRYS ER 20 MEQ PO TBCR: 20.0000 meq | EXTENDED_RELEASE_TABLET | Freq: Every day | ORAL | Status: DC

## 2011-05-20 NOTE — Progress Notes (Signed)
Subjective:    Patient ID: Tommy Bauer, male    DOB: Sep 16, 1941, 70 y.o.   MRN: [id number]  HPI  CC: cpx- doing well.   History of Present Illness:   70 year-old patient who is seen today for a comprehensive evaluation. Medical problems include a history of LV dysfunction. He has hypertension, colonic polyps, and a history of ongoing tobacco use. He has cut his tobacco consumption down considerably after being treated with chanix earlier in the year. denies any symptoms of congestive heart failure. No exertional chest pain. He has decreased his tobacco use, to just a few cigarettes daily. Total cessation discussed and encouraged.   Here for Medicare AWV:   1. Risk factors based on Past M, S, F history: patient has a history of LV dysfunction, hypertension, and tobacco use  2. Physical Activities: no limitations  3. Depression/mood: no history of depression or mood disorder  4. Hearing: no hearing deficits  5. ADL's: remains independent in all aspects of daily living  6. Fall Risk: low  7. Home Safety: no problems identified  8. Height, weight, &visual acuity:height and weight are stable. No difficulty with visual acuity  9. Counseling: total cessation of all tobacco products. Encouraged  10. Labs ordered based on risk factors: laboratory studies, including PSA will be reviewed  11. Referral Coordination-followed urology. This fall  12. Care Plan- heart healthy, salt restricted diet more regular exercise and modest weight loss. All encouraged  13. Cognitive Assessment- alert and oriented, with normal affect   Preventive Screening-Counseling & Management  Alcohol-Tobacco  Smoking Status: current  Smoking Cessation Counseling: yes   Allergies (verified):  No Known Drug Allergies   Past History:  Past Medical History:  Reviewed history from 11/06/2009 and no changes required.  Colonic polyps, hx of  Hypertension  ED  LVD  R inguinal hernia-repaired  prostate cancer   Past  Surgical History:  right inguinal hernia repair- 2009  Colonoscopy October2007  Cardiolite stress test September 2007  status post radical prostatectomy November 2010 (T1C NO MO)   Family History:  Reviewed history from 05/01/2009 and no changes required.  details of his father's health unknown.  Mother died age 10, cancer, unclear type  III sisters, one deceased, age 30 cancer, unclear type;  another died Ca-leukemia  one brother died-trauma and secondary infection   Social History:  Reviewed history from 04/25/2008 and no changes required.  Current Smoker  Married  one son  still works doing Multimedia programmer      Review of Systems  Constitutional: Negative for fever, chills, activity change, appetite change and fatigue.  HENT: Negative for hearing loss, ear pain, congestion, rhinorrhea, sneezing, mouth sores, trouble swallowing, neck pain, neck stiffness, dental problem, voice change, sinus pressure and tinnitus.   Eyes: Negative for photophobia, pain, redness and visual disturbance.  Respiratory: Negative for apnea, cough, choking, chest tightness, shortness of breath and wheezing.   Cardiovascular: Negative for chest pain, palpitations and leg swelling.  Gastrointestinal: Negative for nausea, vomiting, abdominal pain, diarrhea, constipation, blood in stool, abdominal distention, anal bleeding and rectal pain.  Genitourinary: Negative for dysuria, urgency, frequency, hematuria, flank pain, decreased urine volume, discharge, penile swelling, scrotal swelling, difficulty urinating, genital sores and testicular pain.  Musculoskeletal: Negative for myalgias, back pain, joint swelling, arthralgias and gait problem.  Skin: Negative for color change, rash and wound.  Neurological: Negative for dizziness, tremors, seizures, syncope, facial asymmetry, speech difficulty, weakness, light-headedness, numbness and headaches.  Hematological: Negative for adenopathy.  Does not  bruise/bleed easily.  Psychiatric/Behavioral: Negative for suicidal ideas, hallucinations, behavioral problems, confusion, sleep disturbance, self-injury, dysphoric mood, decreased concentration and agitation. The patient is not nervous/anxious.        Objective:   Physical Exam  Constitutional: He appears well-developed and well-nourished. No distress.  HENT:  Head: Normocephalic and atraumatic.  Right Ear: External ear normal.  Left Ear: External ear normal.  Nose: Nose normal.  Mouth/Throat: Oropharynx is clear and moist.  Eyes: Conjunctivae and EOM are normal. Pupils are equal, round, and reactive to light. No scleral icterus.  Neck: Normal range of motion. Neck supple. No JVD present. No thyromegaly present.  Cardiovascular: Regular rhythm, normal heart sounds and intact distal pulses.  Exam reveals no gallop and no friction rub.   No murmur heard. Pulmonary/Chest: Effort normal and breath sounds normal. He exhibits no tenderness.  Abdominal: Soft. Bowel sounds are normal. He exhibits no distension and no mass. There is no tenderness.  Genitourinary: Penis normal. Guaiac negative stool.  Musculoskeletal: Normal range of motion. He exhibits no edema and no tenderness.  Lymphadenopathy:    He has no cervical adenopathy.  Neurological: He is alert. He has normal reflexes. No cranial nerve deficit. Coordination normal.  Skin: Skin is warm and dry. No rash noted.  Psychiatric: He has a normal mood and affect. His behavior is normal.          Assessment & Plan:    Preventive health Hypertension stable Left ventricular dysfunction. Asymptomatic History colonic polyps. Needs followup colonoscopy History of prostate cancer. We'll check a PSA

## 2011-05-20 NOTE — Patient Instructions (Signed)
Smoking tobacco is very bad for your health. You should stop smoking immediately.  Limit your sodium (Salt) intake  Return in 6 months for follow-up  Schedule your colonoscopy to help detect colon cancer.

## 2011-05-20 NOTE — Progress Notes (Signed)
Quick Note:  Attempt to call - no ans no mach - will change med list and sent new rx to pharmacy. Will try to call later. KIK ______

## 2011-05-23 NOTE — Progress Notes (Signed)
Quick Note:  I have attempted to contact several times - hm# - no ans no mach - called daughter ,CAMMIE. Told that I called out med on Friday that he needs to start today - she will let him know and I asked that he call and leave me VM that he got med. KIK ______

## 2011-07-22 LAB — BASIC METABOLIC PANEL
BUN: 10
BUN: 11
BUN: 14
BUN: 19
BUN: 40 — ABNORMAL HIGH
BUN: 5 — ABNORMAL LOW
CO2: 32
CO2: 35 — ABNORMAL HIGH
CO2: 35 — ABNORMAL HIGH
Calcium: 8.1 — ABNORMAL LOW
Calcium: 8.2 — ABNORMAL LOW
Calcium: 8.4
Chloride: 90 — ABNORMAL LOW
Chloride: 93 — ABNORMAL LOW
Chloride: 94 — ABNORMAL LOW
Creatinine, Ser: 0.97
Creatinine, Ser: 1
Creatinine, Ser: 1.13
Creatinine, Ser: 2.16 — ABNORMAL HIGH
GFR calc Af Amer: >60
GFR calc Af Amer: >60
GFR calc non Af Amer: 31 — ABNORMAL LOW
GFR calc non Af Amer: >60
GFR calc non Af Amer: >60
Glucose, Bld: 104 — ABNORMAL HIGH
Glucose, Bld: 130 — ABNORMAL HIGH
Glucose, Bld: 134 — ABNORMAL HIGH
Potassium: 3.1 — ABNORMAL LOW
Potassium: 3.5

## 2011-07-22 LAB — DIFFERENTIAL
Basophils Absolute: 0
Basophils Relative: 0
Eosinophils Absolute: 0
Eosinophils Relative: 0
Monocytes Absolute: 0.5
Monocytes Relative: 9

## 2011-07-22 LAB — CBC
HCT: 37 — ABNORMAL LOW
HCT: 38 — ABNORMAL LOW
HCT: 39.3
HCT: 40.6
HCT: 47.9
Hemoglobin: 16.7
MCHC: 34.7
MCV: 88.7
Platelets: 193
Platelets: 206
Platelets: 255
Platelets: 272
RBC: 4.46
RBC: 4.57
RDW: 13.7
RDW: 14.2
RDW: 14.3
WBC: 3.4 — ABNORMAL LOW
WBC: 3.5 — ABNORMAL LOW
WBC: 5.3
WBC: 5.8

## 2011-07-22 LAB — COMPREHENSIVE METABOLIC PANEL
ALT: 46
Albumin: 4.2
Alkaline Phosphatase: 49
BUN: 50 — ABNORMAL HIGH
Chloride: 87 — ABNORMAL LOW
Potassium: 3.4 — ABNORMAL LOW
Sodium: 133 — ABNORMAL LOW
Total Bilirubin: 2 — ABNORMAL HIGH
Total Protein: 8

## 2011-10-06 ENCOUNTER — Encounter: Payer: Self-pay | Admitting: Gastroenterology

## 2011-11-18 ENCOUNTER — Ambulatory Visit: Payer: Medicare Other | Admitting: Internal Medicine

## 2011-11-22 ENCOUNTER — Encounter: Payer: Self-pay | Admitting: Internal Medicine

## 2011-11-22 ENCOUNTER — Ambulatory Visit (INDEPENDENT_AMBULATORY_CARE_PROVIDER_SITE_OTHER): Payer: Medicare Other | Admitting: Internal Medicine

## 2011-11-22 DIAGNOSIS — I10 Essential (primary) hypertension: Secondary | ICD-10-CM | POA: Diagnosis not present

## 2011-11-22 DIAGNOSIS — I2510 Atherosclerotic heart disease of native coronary artery without angina pectoris: Secondary | ICD-10-CM

## 2011-11-22 MED ORDER — POTASSIUM CHLORIDE CRYS ER 20 MEQ PO TBCR: 20.0000 meq | EXTENDED_RELEASE_TABLET | Freq: Every day | ORAL | Status: DC

## 2011-11-22 MED ORDER — CHLORTHALIDONE 25 MG PO TABS: 25.0000 mg | ORAL_TABLET | Freq: Every day | ORAL | Status: DC

## 2011-11-22 MED ORDER — METOPROLOL SUCCINATE ER 100 MG PO TB24: 100.0000 mg | ORAL_TABLET | Freq: Every day | ORAL | Status: DC

## 2011-11-22 MED ORDER — TRAMADOL HCL 50 MG PO TABS: 50.0000 mg | ORAL_TABLET | Freq: Four times a day (QID) | ORAL | Status: DC | PRN

## 2011-11-22 MED ORDER — BENAZEPRIL HCL 40 MG PO TABS: 40.0000 mg | ORAL_TABLET | Freq: Every day | ORAL | Status: DC

## 2011-11-22 NOTE — Patient Instructions (Signed)
Limit your sodium (Salt) intake  Please check your blood pressure on a regular basis.  If it is consistently greater than 150/90, please make an office appointment.  You need to lose weight.  Consider a lower calorie diet and regular exercise. 

## 2011-11-22 NOTE — Progress Notes (Signed)
  Subjective:    Patient ID: Tommy Bauer, male    DOB: 10-06-1941, 71 y.o.   MRN: [id number]  HPI  71 year old patient who was seen today for followup of hypertension. He is on triple drug therapy. He has a history also of LV dysfunction. He feels well today without cardiopulmonary complaints. He  claims compliance with all his medications. Review of Systems  Constitutional: Negative for fever, chills, appetite change and fatigue.  HENT: Negative for hearing loss, ear pain, congestion, sore throat, trouble swallowing, neck stiffness, dental problem, voice change and tinnitus.   Eyes: Negative for pain, discharge and visual disturbance.  Respiratory: Negative for cough, chest tightness, wheezing and stridor.   Cardiovascular: Negative for chest pain, palpitations and leg swelling.  Gastrointestinal: Negative for nausea, vomiting, abdominal pain, diarrhea, constipation, blood in stool and abdominal distention.  Genitourinary: Negative for urgency, hematuria, flank pain, discharge, difficulty urinating and genital sores.  Musculoskeletal: Negative for myalgias, back pain, joint swelling, arthralgias and gait problem.  Skin: Negative for rash.  Neurological: Negative for dizziness, syncope, speech difficulty, weakness, numbness and headaches.  Hematological: Negative for adenopathy. Does not bruise/bleed easily.  Psychiatric/Behavioral: Negative for behavioral problems and dysphoric mood. The patient is not nervous/anxious.        Objective:   Physical Exam  Constitutional: He is oriented to person, place, and time. He appears well-developed.       140/90  HENT:  Head: Normocephalic.  Right Ear: External ear normal.  Left Ear: External ear normal.  Eyes: Conjunctivae and EOM are normal.  Neck: Normal range of motion.  Cardiovascular: Normal rate and normal heart sounds.   Pulmonary/Chest: Breath sounds normal.  Abdominal: Bowel sounds are normal.  Musculoskeletal: Normal range of  motion. He exhibits no edema and no tenderness.  Neurological: He is alert and oriented to person, place, and time.  Psychiatric: He has a normal mood and affect. His behavior is normal.          Assessment & Plan:   Hypertension. We'll increase the metoprolol to 100 mg daily Low salt diet recommended exercise weight loss all encouraged LV dysfunction stable  Recheck 3 months

## 2011-12-05 ENCOUNTER — Encounter: Payer: Self-pay | Admitting: Gastroenterology

## 2012-01-18 DIAGNOSIS — C61 Malignant neoplasm of prostate: Secondary | ICD-10-CM | POA: Diagnosis not present

## 2012-01-25 ENCOUNTER — Telehealth: Payer: Self-pay | Admitting: Internal Medicine

## 2012-01-25 DIAGNOSIS — C61 Malignant neoplasm of prostate: Secondary | ICD-10-CM | POA: Diagnosis not present

## 2012-01-25 DIAGNOSIS — N529 Male erectile dysfunction, unspecified: Secondary | ICD-10-CM | POA: Diagnosis not present

## 2012-01-25 NOTE — Telephone Encounter (Signed)
Pts daughter called and said that her father slipped and fell in tub this a.m.  Said there is no bruising, cut or bump, from fall. Pt refused to see another doctor and is schd to come in on Thurs 01/26/12 in afternoon. Pls advise if ok to work in for a.m. appt tomorrow?

## 2012-01-25 NOTE — Telephone Encounter (Signed)
Wife declines to come back today to see anyone today, and insists he keeps the appt tomorrow with Dr. Kirtland Bouchard.  Advised to call 911 if he continues to fall or anything changes in his status.  Family feels he should keep the appt tomorrow.

## 2012-01-26 ENCOUNTER — Encounter: Payer: Self-pay | Admitting: Internal Medicine

## 2012-01-26 ENCOUNTER — Ambulatory Visit (INDEPENDENT_AMBULATORY_CARE_PROVIDER_SITE_OTHER): Payer: Medicare Other | Admitting: Internal Medicine

## 2012-01-26 VITALS — BP 150/86 | Temp 101.7°F | Wt 208.0 lb

## 2012-01-26 DIAGNOSIS — F17200 Nicotine dependence, unspecified, uncomplicated: Secondary | ICD-10-CM

## 2012-01-26 DIAGNOSIS — S8000XA Contusion of unspecified knee, initial encounter: Secondary | ICD-10-CM

## 2012-01-26 DIAGNOSIS — R509 Fever, unspecified: Secondary | ICD-10-CM

## 2012-01-26 DIAGNOSIS — I10 Essential (primary) hypertension: Secondary | ICD-10-CM

## 2012-01-26 DIAGNOSIS — S8002XA Contusion of left knee, initial encounter: Secondary | ICD-10-CM

## 2012-01-26 NOTE — Progress Notes (Signed)
  Subjective:    Patient ID: Tommy Bauer, male    DOB: 1941/05/03, 71 y.o.   MRN: [id number]  HPI  71 year old patient who has a history of treated hypertension and ongoing tobacco use. For the past week he has had 2 other falls the last occurring yesterday while attempting to get out of his bathtub. He sustained trauma to the left shoulder and left knee area. He has been weak. Evaluation today noted a temperature of 101.7. He denies any focal symptoms or any cough. Complaints include weakness left shoulder and left knee discomfort.    Review of Systems  Constitutional: Positive for fatigue. Negative for fever, chills and appetite change.  HENT: Negative for hearing loss, ear pain, congestion, sore throat, trouble swallowing, neck stiffness, dental problem, voice change and tinnitus.   Eyes: Negative for pain, discharge and visual disturbance.  Respiratory: Negative for cough, chest tightness, wheezing and stridor.   Cardiovascular: Negative for chest pain, palpitations and leg swelling.  Gastrointestinal: Negative for nausea, vomiting, abdominal pain, diarrhea, constipation, blood in stool and abdominal distention.  Genitourinary: Negative for urgency, hematuria, flank pain, discharge, difficulty urinating and genital sores.  Musculoskeletal: Negative for myalgias, back pain, joint swelling, arthralgias and gait problem.  Skin: Negative for rash.  Neurological: Positive for weakness. Negative for dizziness, syncope, speech difficulty, numbness and headaches.  Hematological: Negative for adenopathy. Does not bruise/bleed easily.  Psychiatric/Behavioral: Negative for behavioral problems and dysphoric mood. The patient is not nervous/anxious.        Objective:   Physical Exam  Constitutional: He is oriented to person, place, and time. He appears well-developed and well-nourished. No distress.       Alert appropriate in no distress Temperature 101.7 Repeat blood pressure 140/80 No  tachycardia  HENT:  Head: Normocephalic.  Right Ear: External ear normal.  Left Ear: External ear normal.  Eyes: Conjunctivae and EOM are normal.  Neck: Normal range of motion.  Cardiovascular: Normal rate and normal heart sounds.   Pulmonary/Chest: Breath sounds normal.  Abdominal: Bowel sounds are normal.  Musculoskeletal: Normal range of motion. He exhibits tenderness. He exhibits no edema.       Tenderness about the left shoulder and left knee. No effusion or obvious ecchymoses range of motion intact  Neurological: He is alert and oriented to person, place, and time.  Psychiatric: He has a normal mood and affect. His behavior is normal.          Assessment & Plan:   Acute febrile illness Tobacco use Weakness secondary to acute febrile illness Contusion left shoulder left knee  The patient was encouraged to force fluids and to take Tylenol every 6 hours. He is given samples of Celebrex to take 200 mg twice daily  He'll call if he develops any clinical worsening  Was also given samples of Voltaren gel to apply to the left shoulder and left knee area

## 2012-01-26 NOTE — Patient Instructions (Signed)
Drink as much fluid as you  can tolerate over the next few days  Tylenol 1-2 tabs po q4h prn  celebrex  One twice daily  Call or return to clinic prn if these symptoms worsen or fail to improve as anticipated.

## 2012-02-02 ENCOUNTER — Encounter: Payer: Self-pay | Admitting: Gastroenterology

## 2012-02-08 ENCOUNTER — Encounter: Payer: Self-pay | Admitting: *Deleted

## 2012-02-09 ENCOUNTER — Ambulatory Visit
Admission: RE | Admit: 2012-02-09 | Discharge: 2012-02-09 | Disposition: A | Discharge status: Home / Self Care | Payer: Medicare Other | Source: Ambulatory Visit | Attending: Radiation Oncology | Admitting: Radiation Oncology

## 2012-02-09 ENCOUNTER — Encounter: Payer: Self-pay | Admitting: Radiation Oncology

## 2012-02-09 ENCOUNTER — Encounter: Payer: Self-pay | Admitting: *Deleted

## 2012-02-09 VITALS — BP 146/76 | HR 63 | Temp 98.1°F | Resp 20 | Ht 73.5 in | Wt 209.3 lb

## 2012-02-09 DIAGNOSIS — Z79899 Other long term (current) drug therapy: Secondary | ICD-10-CM | POA: Diagnosis not present

## 2012-02-09 DIAGNOSIS — C61 Malignant neoplasm of prostate: Secondary | ICD-10-CM

## 2012-02-09 NOTE — Progress Notes (Signed)
Please see the Nurse Progress Note in the MD Initial Consult Encounter for this patient. 

## 2012-02-09 NOTE — Progress Notes (Signed)
Radiation Oncology         [phone] ________________________________  Name: Levonte Molina MRN: [id number]  Date: 02/09/2012  DOB: [birth year]  Follow-Up Visit Note  CC: [name], MD, MD  Crecencio Mc, MD  Diagnosis:   71 year old gentleman with detectable PSA of 0.12 following radical prostatectomy for stage TIIIa disease with positive margin and Gleason score 4+3.    Narrative:  The patient returns today to discuss recent events. I saw him for initial consultation on October 13 of 2010. At that point, he is recently been diagnosed with stage TI C. prostate cancer with Gleason score 3+5 and a PSA of 5.64. The patient elected to proceed with prostatectomy on 09/14/2009. Surgical specimen contained Gleason's 4+3 involving the right and left lobes. The surgical margin was described as positive involving less than 0.1 cm on the right and left with extensive perineural involvement. A total of 16 lymph nodes were sampled and all were free of involvement. Postoperatively, patient's PSA was encouraging and undetectable in October and correction in January and May of 2001. In November 2001, became detectable at 0.01 and rose to 0.07 in May of 2012. It doubled to 0.14 09/30/2011 and increased further to 0.24 March 20. This prompted consideration for salvage radiotherapy given that the true recurrence level of 0.2 was exceeded. The patient had a subsequent followup PSA on March 27 which actually decreased back to 0.12. He has been referred today to discuss the potential for salvage radiation treatment versus ongoing PSA surveillance.    ALLERGIES:   has no known allergies.  Meds: Current Outpatient Prescriptions  Medication Sig Dispense Refill  . acetaminophen (TYLENOL) 500 MG tablet Take 500 mg by mouth every 4 (four) hours as needed.      . benazepril (LOTENSIN) 40 MG tablet Take 1 tablet (40 mg total) by mouth daily.  90 tablet  6  . celecoxib (CELEBREX) 100 MG capsule Take 100 mg by  mouth daily.      . chlorthalidone (HYGROTON) 25 MG tablet Take 1 tablet (25 mg total) by mouth daily.  90 tablet  6  . diclofenac sodium (VOLTAREN) 1 % GEL Apply topically 2 (two) times daily as needed. Shoulder and knee prn      . metoprolol succinate (TOPROL-XL) 100 MG 24 hr tablet Take 1 tablet (100 mg total) by mouth daily. Take with or immediately following a meal.  90 tablet  3  . potassium chloride SA (K-DUR,KLOR-CON) 20 MEQ tablet Take 1 tablet (20 mEq total) by mouth daily.  90 tablet  3  . sildenafil (VIAGRA) 100 MG tablet Take 100 mg by mouth daily as needed. As directed       . traMADol (ULTRAM) 50 MG tablet Take 1 tablet (50 mg total) by mouth every 6 (six) hours as needed for pain.  20 tablet  0    Physical Findings: The patient is in no acute distress. Patient is alert and oriented.  height is 6' 1.5" (1.867 m) and weight is 209 lb 4.8 oz (94.938 kg). His other (comment) temperature is 98.1 F (36.7 C). His blood pressure is 146/76 and his pulse is 63. His respiration is 20 and oxygen saturation is 98%. .  No significant changes.  Lab Findings: Lab Results  Component Value Date   WBC 4.8 05/20/2011   HGB 14.8 05/20/2011   HCT 44.3 05/20/2011   MCV 92.3 05/20/2011   PLT 212.0 05/20/2011   Impression:  Today, talked to the patient about his  surgical pathology showing some high-risk features, primarily positive margins and his subsequent PSA measurements. He has had some evidence of a rising PSA following prostatectomy which is suggestive of potentially recurrent disease. He will remain at risk for local prosthetic fossa recurrence and could potentially benefit from radiotherapy to the prostatic fossa either now or in the near future.  Plan:  Today, we reviewed the rationale for prosthetic fossa radiotherapy. We talked about the logistics and delivery of this treatment as well as the anticipated acute and late sequela. We discussed the goals of proceeding with therapy and the  implications of the PSA level prior to proceeding. I filled out a patient counseling form for this very nice gentleman and his wife and we kept a copy for our records. Counseling form included treatment-related diagrams. The patient asked several purpura questions during the course of our discussion and plans to followup with Dr. Laverle Patter in August for ongoing PSA surveillance. I will look forward to following one of his progress and potentially offer radiotherapy if clinically indicated.  _____________________________________  Artist Pais. Kathrynn Running, M.D.

## 2012-02-09 NOTE — Progress Notes (Signed)
[facility] Prostate [facility]  09/14/2009= Radical Prostatectomy:adenocarcinoma,gleason=4+3=7,PSA=5.65   PSA 01/25/12=0.12, 01/18/12=0.24,09/30/11=0.14, 03/23/11=0.07, 09/20/10=0.01, 03/19/10=<0.04, 11/09/09=<0.04  Alert and oriented x3, no dysuria, pain, has nocturia 2-3x  Just started recently past 2-3 weeks,  Married,retired carpenter, 3 children,  mother with lung ca,sister had liver ca,   Allergies:NKDA

## 2012-02-20 ENCOUNTER — Encounter: Payer: Self-pay | Admitting: Internal Medicine

## 2012-02-20 ENCOUNTER — Ambulatory Visit (INDEPENDENT_AMBULATORY_CARE_PROVIDER_SITE_OTHER): Payer: Medicare Other | Admitting: Internal Medicine

## 2012-02-20 VITALS — BP 140/80 | Temp 98.5°F | Wt 207.0 lb

## 2012-02-20 DIAGNOSIS — I10 Essential (primary) hypertension: Secondary | ICD-10-CM | POA: Diagnosis not present

## 2012-02-20 DIAGNOSIS — I2510 Atherosclerotic heart disease of native coronary artery without angina pectoris: Secondary | ICD-10-CM

## 2012-02-20 MED ORDER — POTASSIUM CHLORIDE CRYS ER 20 MEQ PO TBCR: 20.0000 meq | EXTENDED_RELEASE_TABLET | Freq: Every day | ORAL | Status: DC

## 2012-02-20 MED ORDER — CELECOXIB 100 MG PO CAPS: 100.0000 mg | ORAL_CAPSULE | Freq: Every day | ORAL | Status: DC

## 2012-02-20 MED ORDER — TRAMADOL HCL 50 MG PO TABS: 50.0000 mg | ORAL_TABLET | Freq: Four times a day (QID) | ORAL | Status: DC | PRN

## 2012-02-20 MED ORDER — METOPROLOL SUCCINATE ER 100 MG PO TB24: 100.0000 mg | ORAL_TABLET | Freq: Every day | ORAL | Status: DC

## 2012-02-20 MED ORDER — BENAZEPRIL HCL 40 MG PO TABS: 40.0000 mg | ORAL_TABLET | Freq: Every day | ORAL | Status: DC

## 2012-02-20 MED ORDER — CHLORTHALIDONE 25 MG PO TABS: 25.0000 mg | ORAL_TABLET | Freq: Every day | ORAL | Status: DC

## 2012-02-20 NOTE — Progress Notes (Signed)
  Subjective:    Patient ID: Tommy Bauer, male    DOB: 1941/04/11, 71 y.o.   MRN: [id number]  HPI   71 year old patient who is in today for followup of hypertension. He also has a history of left ventricular dysfunction ongoing tobacco use. He is doing quite well today no concerns or complaints. Denies any exertional shortness of breath or any chest pain.  Wt Readings from Last 3 Encounters:  02/20/12 207 lb (93.895 kg)  02/09/12 209 lb 4.8 oz (94.938 kg)  01/26/12 208 lb (94.348 kg)     Review of Systems  Constitutional: Negative for fever, chills, appetite change and fatigue.  HENT: Negative for hearing loss, ear pain, congestion, sore throat, trouble swallowing, neck stiffness, dental problem, voice change and tinnitus.   Eyes: Negative for pain, discharge and visual disturbance.  Respiratory: Negative for cough, chest tightness, wheezing and stridor.   Cardiovascular: Negative for chest pain, palpitations and leg swelling.  Gastrointestinal: Negative for nausea, vomiting, abdominal pain, diarrhea, constipation, blood in stool and abdominal distention.  Genitourinary: Negative for urgency, hematuria, flank pain, discharge, difficulty urinating and genital sores.  Musculoskeletal: Negative for myalgias, back pain, joint swelling, arthralgias and gait problem.  Skin: Negative for rash.  Neurological: Negative for dizziness, syncope, speech difficulty, weakness, numbness and headaches.  Hematological: Negative for adenopathy. Does not bruise/bleed easily.  Psychiatric/Behavioral: Negative for behavioral problems and dysphoric mood. The patient is not nervous/anxious.        Objective:   Physical Exam  Constitutional: He is oriented to person, place, and time. He appears well-developed.  HENT:  Head: Normocephalic.  Right Ear: External ear normal.  Left Ear: External ear normal.  Eyes: Conjunctivae and EOM are normal.  Neck: Normal range of motion.  Cardiovascular: Normal rate and  normal heart sounds.   Pulmonary/Chest: Breath sounds normal.  Abdominal: Bowel sounds are normal.  Musculoskeletal: Normal range of motion. He exhibits no edema and no tenderness.  Neurological: He is alert and oriented to person, place, and time.  Psychiatric: He has a normal mood and affect. His behavior is normal.          Assessment & Plan:   Hypertension. Well controlled. Medications refilled Left ventricular dysfunction. We'll continue present regimen which includes beta blocker therapy and ACE inhibition  Recheck 4 months

## 2012-02-20 NOTE — Patient Instructions (Signed)
Limit your sodium (Salt) intake  Please check your blood pressure on a regular basis.  If it is consistently greater than 150/90, please make an office appointment.  You need to lose weight.  Consider a lower calorie diet and regular exercise.  Return in 4 months for follow-up

## 2012-03-09 ENCOUNTER — Ambulatory Visit (AMBULATORY_SURGERY_CENTER): Payer: Medicare Other | Admitting: *Deleted

## 2012-03-09 VITALS — Ht 73.0 in | Wt 203.0 lb

## 2012-03-09 DIAGNOSIS — Z1211 Encounter for screening for malignant neoplasm of colon: Secondary | ICD-10-CM

## 2012-03-09 DIAGNOSIS — Z86010 Personal history of colonic polyps: Secondary | ICD-10-CM

## 2012-03-09 MED ORDER — PEG-KCL-NACL-NASULF-NA ASC-C 100 G PO SOLR: ORAL | Status: DC

## 2012-03-20 NOTE — Progress Notes (Signed)
Encounter addended by: Lowella Petties, RN on: 03/20/2012 10:09 AM<BR>     Documentation filed: Charges VN

## 2012-03-23 ENCOUNTER — Ambulatory Visit (AMBULATORY_SURGERY_CENTER): Payer: Medicare Other | Admitting: Gastroenterology

## 2012-03-23 ENCOUNTER — Encounter: Payer: Self-pay | Admitting: Gastroenterology

## 2012-03-23 VITALS — BP 140/76 | HR 61 | Temp 97.7°F | Resp 20 | Ht 73.0 in | Wt 203.0 lb

## 2012-03-23 DIAGNOSIS — Z86010 Personal history of colonic polyps: Secondary | ICD-10-CM | POA: Diagnosis not present

## 2012-03-23 DIAGNOSIS — D126 Benign neoplasm of colon, unspecified: Secondary | ICD-10-CM | POA: Diagnosis not present

## 2012-03-23 DIAGNOSIS — Z1211 Encounter for screening for malignant neoplasm of colon: Secondary | ICD-10-CM

## 2012-03-23 DIAGNOSIS — K620 Anal polyp: Secondary | ICD-10-CM | POA: Diagnosis not present

## 2012-03-23 DIAGNOSIS — K621 Rectal polyp: Secondary | ICD-10-CM

## 2012-03-23 MED ORDER — SODIUM CHLORIDE 0.9 % IV SOLN: 500.0000 mL | INTRAVENOUS | Status: DC

## 2012-03-23 NOTE — Progress Notes (Signed)
Patient did not experience any of the following events: a burn prior to discharge; a fall within the facility; wrong site/side/patient/procedure/implant event; or a hospital transfer or hospital admission upon discharge from the facility. (G8907) Patient did not have preoperative order for IV antibiotic SSI prophylaxis. (G8918)  

## 2012-03-23 NOTE — Patient Instructions (Signed)
YOU HAD AN ENDOSCOPIC PROCEDURE TODAY AT THE [facility]: Refer to the procedure report that was given to you for any specific questions about what was found during the examination.  If the procedure report does not answer your questions, please call your gastroenterologist to clarify.  If you requested that your care partner not be given the details of your procedure findings, then the procedure report has been included in a sealed envelope for you to review at your convenience later.  YOU SHOULD EXPECT: Some feelings of bloating in the abdomen. Passage of more gas than usual.  Walking can help get rid of the air that was put into your GI tract during the procedure and reduce the bloating. If you had a lower endoscopy (such as a colonoscopy or flexible sigmoidoscopy) you may notice spotting of blood in your stool or on the toilet paper. If you underwent a bowel prep for your procedure, then you may not have a normal bowel movement for a few days.  DIET: Your first meal following the procedure should be a light meal and then it is ok to progress to your normal diet.  A half-sandwich or bowl of soup is an example of a good first meal.  Heavy or fried foods are harder to digest and may make you feel nauseous or bloated.  Likewise meals heavy in dairy and vegetables can cause extra gas to form and this can also increase the bloating.  Drink plenty of fluids but you should avoid alcoholic beverages for 24 hours.  ACTIVITY: Your care partner should take you home directly after the procedure.  You should plan to take it easy, moving slowly for the rest of the day.  You can resume normal activity the day after the procedure however you should NOT DRIVE or use heavy machinery for 24 hours (because of the sedation medicines used during the test).    SYMPTOMS TO REPORT IMMEDIATELY: A gastroenterologist can be reached at any hour.  During normal business hours, 8:30 AM to 5:00 PM Monday through Friday,  call [phone].  After hours and on weekends, please call the GI answering service at [phone] who will take a message and have the physician on call contact you.   Following lower endoscopy (colonoscopy or flexible sigmoidoscopy):  Excessive amounts of blood in the stool  Significant tenderness or worsening of abdominal pains  Swelling of the abdomen that is new, acute  Fever of 100F or higher  Following upper endoscopy (EGD)  Vomiting of blood or coffee ground material  New chest pain or pain under the shoulder blades  Painful or persistently difficult swallowing  New shortness of breath  Fever of 100F or higher  Black, tarry-looking stools  FOLLOW UP: If any biopsies were taken you will be contacted by phone or by letter within the next 1-3 weeks.  Call your gastroenterologist if you have not heard about the biopsies in 3 weeks.  Our staff will call the home number listed on your records the next business day following your procedure to check on you and address any questions or concerns that you may have at that time regarding the information given to you following your procedure. This is a courtesy call and so if there is no answer at the home number and we have not heard from you through the emergency physician on call, we will assume that you have returned to your regular daily activities without incident.  SIGNATURES/CONFIDENTIALITY: You and/or your care   partner have signed paperwork which will be entered into your electronic medical record.  These signatures attest to the fact that that the information above on your After Visit Summary has been reviewed and is understood.  Full responsibility of the confidentiality of this discharge information lies with you and/or your care-partner.  

## 2012-03-23 NOTE — Op Note (Signed)
[facility] [address] [place], [place]  [address]  COLONOSCOPY PROCEDURE REPORT PATIENT:  Tommy Bauer, Tommy Bauer  MR#:  [id number] BIRTHDATE:  [birth year], 71 yrs. old  GENDER:  male ENDOSCOPIST:  Judie Petit T. Russella Dar, MD, [facility]  PROCEDURE DATE:  03/23/2012 PROCEDURE:  Colonoscopy with biopsy and snare polypectomy ASA CLASS:  Class II INDICATIONS:  1) surveillance and high-risk screening  2) history of pre-cancerous (adenomatous) colon polyps : 07/2006 MEDICATIONS:   These medications were titrated to patient response per physician's verbal order, Fentanyl 100 mcg IV, Benadryl 25 mg IV, Versed 9 mg IV DESCRIPTION OF PROCEDURE:   After the risks benefits and alternatives of the procedure were thoroughly explained, informed consent was obtained.  Digital rectal exam was performed and revealed no abnormalities.   The LB CF-[id number] endoscope was introduced through the anus and advanced to the cecum, which was identified by both the appendix and ileocecal valve, without limitations.  The quality of the prep was excellent, using MoviPrep.  The instrument was then slowly withdrawn as the colon was fully examined. <<PROCEDUREIMAGES>> FINDINGS:  A sessile polyp was found in the mid transverse colon. It was 5 mm in size. Polyp was snared without cautery. Retrieval was successful.   A sessile polyp was found in the descending colon. It was 4 mm in size. The polyp was removed using cold biopsy forceps.  A sessile polyp was found in the rectum. It was 8 mm in size. Polyp was snared without cautery. Retrieval was successful. Otherwise normal colonoscopy without other polyps, masses, vascular ectasias, or inflammatory changes. Retroflexed views in the rectum revealed internal hemorrhoids, small.  The time to cecum =  2.33  minutes. The scope was then withdrawn (time =  11  min) from the patient and the procedure completed.  COMPLICATIONS:  None  ENDOSCOPIC IMPRESSION: 1) 5 mm sessile  polyp in the mid transverse colon 2) 4 mm sessile polyp in the descending colon 3) 8 mm sessile polyp in the rectum 4) Internal hemorrhoids  RECOMMENDATIONS: 1) Await pathology results 2) Repeat Colonoscopy in 5 years.  [name], MD, [name]  n. eSIGNED:   [name]. Daysie Helf at 03/23/2012 11:22 AM  [name], [id number]

## 2012-03-27 ENCOUNTER — Telehealth: Payer: Self-pay | Admitting: *Deleted

## 2012-03-27 NOTE — Telephone Encounter (Signed)
  Follow up Call-  Call back number 03/23/2012 03/23/2012  Post procedure Call Back phone  # IF NO ANSWER- [phone], REACHES ANN OR CAMI Smoak, MAY LEAVE A MESSAGE [phone]  Permission to leave phone message - Yes  comments - DOES NOT HAVE ANSWERING MACHINE      Patient questions:  Do you have a fever, pain , or abdominal swelling? no Pain Score  0 *  Have you tolerated food without any problems? yes  Have you been able to return to your normal activities? yes  Do you have any questions about your discharge instructions: Diet   no Medications  no Follow up visit  no  Do you have questions or concerns about your Care? no  Actions: * If pain score is 4 or above: No action needed, pain <4.

## 2012-03-28 ENCOUNTER — Encounter: Payer: Self-pay | Admitting: Gastroenterology

## 2012-06-06 DIAGNOSIS — C61 Malignant neoplasm of prostate: Secondary | ICD-10-CM | POA: Diagnosis not present

## 2012-06-13 DIAGNOSIS — C61 Malignant neoplasm of prostate: Secondary | ICD-10-CM | POA: Diagnosis not present

## 2012-06-13 DIAGNOSIS — N529 Male erectile dysfunction, unspecified: Secondary | ICD-10-CM | POA: Diagnosis not present

## 2012-06-22 ENCOUNTER — Encounter: Payer: Self-pay | Admitting: Radiation Oncology

## 2012-06-25 ENCOUNTER — Encounter: Payer: Self-pay | Admitting: Radiation Oncology

## 2012-06-25 ENCOUNTER — Ambulatory Visit
Admission: RE | Admit: 2012-06-25 | Discharge: 2012-06-25 | Disposition: A | Discharge status: Home / Self Care | Payer: Medicare Other | Source: Ambulatory Visit | Attending: Radiation Oncology | Admitting: Radiation Oncology

## 2012-06-25 VITALS — BP 162/68 | HR 82 | Temp 98.6°F | Wt 207.6 lb

## 2012-06-25 DIAGNOSIS — Z79899 Other long term (current) drug therapy: Secondary | ICD-10-CM | POA: Diagnosis not present

## 2012-06-25 DIAGNOSIS — C61 Malignant neoplasm of prostate: Secondary | ICD-10-CM | POA: Diagnosis not present

## 2012-06-25 NOTE — Progress Notes (Signed)
  Radiation Oncology         [phone] ________________________________  Name: Tommy Bauer MRN: [id number]  Date: 06/25/2012  DOB: 01-22-1941  Follow-Up Visit Note  CC: [name], MD  Crecencio Mc, MD  Diagnosis:   71 yo man with rising PSA of 0.36 after RP with ECE  Narrative:  The patient returns today for routine follow-up.  I have discussed radiation with him in the past for rising PSA with ECE after RP.  His most recent PSA is higher at 0.36 confirming biochemical recurrence.                              ALLERGIES:   has no known allergies.  Meds: Current Outpatient Prescriptions  Medication Sig Dispense Refill  . acetaminophen (TYLENOL) 500 MG tablet Take 500 mg by mouth every 4 (four) hours as needed.      . benazepril (LOTENSIN) 40 MG tablet Take 1 tablet (40 mg total) by mouth daily.  90 tablet  6  . celecoxib (CELEBREX) 100 MG capsule Take 1 capsule (100 mg total) by mouth daily.  90 capsule  3  . chlorthalidone (HYGROTON) 25 MG tablet Take 1 tablet (25 mg total) by mouth daily.  90 tablet  6  . diclofenac sodium (VOLTAREN) 1 % GEL Apply topically 2 (two) times daily as needed. Shoulder and knee prn      . metoprolol succinate (TOPROL-XL) 100 MG 24 hr tablet Take 1 tablet (100 mg total) by mouth daily. Take with or immediately following a meal.  90 tablet  3  . potassium chloride SA (K-DUR,KLOR-CON) 20 MEQ tablet Take 1 tablet (20 mEq total) by mouth daily.  90 tablet  3  . sildenafil (VIAGRA) 100 MG tablet Take 100 mg by mouth daily as needed. As directed       . peg 3350 powder (MOVIPREP) 100 G SOLR Moviprep-take as directed.  1 kit  0  . traMADol (ULTRAM) 50 MG tablet Take 1 tablet (50 mg total) by mouth every 6 (six) hours as needed for pain.  20 tablet  0    Physical Findings: The patient is in no acute distress. Patient is alert and oriented.  weight is 207 lb 9.6 oz (94.167 kg). His temperature is 98.6 F (37 C). His blood pressure is 162/68 and his  pulse is 82. His oxygen saturation is 100%. .  No significant changes.  Lab Findings: Lab Results  Component Value Date   WBC 4.8 05/20/2011   HGB 14.8 05/20/2011   HCT 44.3 05/20/2011   MCV 92.3 05/20/2011   PLT 212.0 05/20/2011    Impression:  The patient is eligible for salvage radiotherapy to the prostatic fossa.  Plan:  Today I reviewed the details of radiation again and they will return for simulation on Friday 8/30.  _____________________________________  Artist Pais. Kathrynn Running, M.D.

## 2012-06-25 NOTE — Progress Notes (Signed)
Patient here follow up new consult with continued elevation of PSA of 0.36 on 06/06/12 post radical prostatectomy on 09/14/2009.

## 2012-06-25 NOTE — Progress Notes (Signed)
Please see the Nurse Progress Note in the MD Initial Consult Encounter for this patient. 

## 2012-06-29 ENCOUNTER — Ambulatory Visit: Admission: RE | Admit: 2012-06-29 | Payer: Medicare Other | Source: Ambulatory Visit

## 2012-06-29 ENCOUNTER — Ambulatory Visit
Admission: RE | Admit: 2012-06-29 | Discharge: 2012-06-29 | Disposition: A | Discharge status: Home / Self Care | Payer: Medicare Other | Source: Ambulatory Visit | Attending: Radiation Oncology | Admitting: Radiation Oncology

## 2012-06-29 DIAGNOSIS — C61 Malignant neoplasm of prostate: Secondary | ICD-10-CM | POA: Diagnosis not present

## 2012-06-30 ENCOUNTER — Encounter: Payer: Self-pay | Admitting: Radiation Oncology

## 2012-06-30 NOTE — Progress Notes (Signed)
  Radiation Oncology         [phone]) [phone] ________________________________  Name: Tommy Bauer MRN: [id number]  Date: 06/29/2012  DOB: [birth year]  SIMULATION AND TREATMENT PLANNING NOTE  DIAGNOSIS:  72 yo man with rising PSA of 0.36 after radical prostatectomy with extracapsular extension noted on pathology  NARRATIVE:  The patient was brought to the CT Simulation planning suite.  Identity was confirmed.  All relevant records and images related to the planned course of therapy were reviewed.  The patient freely provided informed written consent to proceed with treatment after reviewing the details related to the planned course of therapy. The consent form was witnessed and verified by the simulation staff.  Then, the patient was set-up in a stable reproducible  supine position for radiation therapy.  CT images were obtained.  Surface markings were placed.  The CT images were loaded into the planning software.  Then the target and avoidance structures were contoured.  Treatment planning then occurred.  The radiation prescription was entered and confirmed.  A total of 1 complex treatment devices were fabricated in the form of an alpha cradle leg positioner custom-shaped to immobilize the patient's lower extremities for pelvic repositioning daily. I have requested : Intensity Modulated Radiotherapy (IMRT) is medically necessary for this case for the following reason:  Rectal sparing.Marland Kitchen    PLAN:  The patient will receive 60.4 Gy in 38 fractions.  ________________________________  Artist Pais Kathrynn Running, M.D.

## 2012-07-05 ENCOUNTER — Other Ambulatory Visit: Payer: Medicare Other

## 2012-07-09 DIAGNOSIS — C61 Malignant neoplasm of prostate: Secondary | ICD-10-CM | POA: Diagnosis not present

## 2012-07-11 ENCOUNTER — Ambulatory Visit
Admission: RE | Admit: 2012-07-11 | Discharge: 2012-07-11 | Disposition: A | Discharge status: Home / Self Care | Payer: Medicare Other | Source: Ambulatory Visit | Attending: Radiation Oncology | Admitting: Radiation Oncology

## 2012-07-11 DIAGNOSIS — C61 Malignant neoplasm of prostate: Secondary | ICD-10-CM | POA: Diagnosis not present

## 2012-07-12 ENCOUNTER — Ambulatory Visit: Payer: Medicare Other | Admitting: Internal Medicine

## 2012-07-12 ENCOUNTER — Ambulatory Visit
Admission: RE | Admit: 2012-07-12 | Discharge: 2012-07-12 | Disposition: A | Discharge status: Home / Self Care | Payer: Medicare Other | Source: Ambulatory Visit | Attending: Radiation Oncology | Admitting: Radiation Oncology

## 2012-07-12 DIAGNOSIS — C61 Malignant neoplasm of prostate: Secondary | ICD-10-CM | POA: Diagnosis not present

## 2012-07-13 ENCOUNTER — Encounter: Payer: Self-pay | Admitting: Radiation Oncology

## 2012-07-13 ENCOUNTER — Ambulatory Visit
Admission: RE | Admit: 2012-07-13 | Discharge: 2012-07-13 | Disposition: A | Discharge status: Home / Self Care | Payer: Medicare Other | Source: Ambulatory Visit | Attending: Radiation Oncology | Admitting: Radiation Oncology

## 2012-07-13 VITALS — BP 113/61 | HR 66 | Temp 98.3°F | Resp 20 | Wt 204.7 lb

## 2012-07-13 DIAGNOSIS — F17200 Nicotine dependence, unspecified, uncomplicated: Secondary | ICD-10-CM

## 2012-07-13 DIAGNOSIS — C61 Malignant neoplasm of prostate: Secondary | ICD-10-CM | POA: Diagnosis not present

## 2012-07-13 DIAGNOSIS — R972 Elevated prostate specific antigen [PSA]: Secondary | ICD-10-CM

## 2012-07-13 NOTE — Progress Notes (Signed)
Post sim ed completed w/pt, wife and daughter. Gave pt "Radiation and You" booklet, discussed possible temporary side effects: urinary frequency, urgency, burning, rectal discomfort, skin care, fatigue, pain. No questions verbalized.  Pt w/o c/o today.

## 2012-07-15 ENCOUNTER — Encounter: Payer: Self-pay | Admitting: Radiation Oncology

## 2012-07-15 NOTE — Progress Notes (Signed)
  Radiation Oncology         [phone] ________________________________  Name: Tommy Bauer MRN: [id number]  Date: 07/13/2012  DOB: 1941/10/05  Weekly Radiation Therapy Management  71 yo man with rising PSA of 0.36 after radical prostatectomy with extracapsular extension noted on pathology  Current Dose: 5.4 Gy     Planned Dose:  68.4 Gy  Narrative . . . . . . . . The patient presents for routine under treatment assessment.                                           Post sim ed completed w/pt, wife and daughter. Gave pt "Radiation and You" booklet, discussed possible temporary side effects: urinary frequency, urgency, burning, rectal discomfort, skin care, fatigue, pain. No questions verbalized.     The patient is without complaint.                                 Set-up films were reviewed.                                 The chart was checked. Physical Findings. . . Weight essentially stable.  No significant changes. Impression . . . . . . . The patient is  tolerating radiation. Plan . . . . . . . . . . . . Continue treatment as planned.  ________________________________  Artist Pais. Kathrynn Running, M.D.

## 2012-07-16 ENCOUNTER — Ambulatory Visit
Admission: RE | Admit: 2012-07-16 | Discharge: 2012-07-16 | Disposition: A | Discharge status: Home / Self Care | Payer: Medicare Other | Source: Ambulatory Visit | Attending: Radiation Oncology | Admitting: Radiation Oncology

## 2012-07-16 DIAGNOSIS — C61 Malignant neoplasm of prostate: Secondary | ICD-10-CM | POA: Diagnosis not present

## 2012-07-17 ENCOUNTER — Ambulatory Visit
Admission: RE | Admit: 2012-07-17 | Discharge: 2012-07-17 | Disposition: A | Discharge status: Home / Self Care | Payer: Medicare Other | Source: Ambulatory Visit | Attending: Radiation Oncology | Admitting: Radiation Oncology

## 2012-07-17 DIAGNOSIS — C61 Malignant neoplasm of prostate: Secondary | ICD-10-CM | POA: Diagnosis not present

## 2012-07-18 ENCOUNTER — Telehealth: Payer: Self-pay | Admitting: Radiation Oncology

## 2012-07-18 ENCOUNTER — Ambulatory Visit
Admission: RE | Admit: 2012-07-18 | Discharge: 2012-07-18 | Disposition: A | Discharge status: Home / Self Care | Payer: Medicare Other | Source: Ambulatory Visit | Attending: Radiation Oncology | Admitting: Radiation Oncology

## 2012-07-18 DIAGNOSIS — C61 Malignant neoplasm of prostate: Secondary | ICD-10-CM | POA: Diagnosis not present

## 2012-07-18 NOTE — Telephone Encounter (Signed)
Met with pt's dau (Cami) due to concerns with finances.  Gave pt an EPP and MCD application to complete and bring back. Pt lives alone with SS benefits of $1,011.90 w/$99.90 deduct from [facility] medical insurance.  Dx: 185 Malignant neoplasm of prostate  Attending Rad: Dr. Landis Gandy Tx: [phone] IMRT

## 2012-07-19 ENCOUNTER — Telehealth: Payer: Self-pay | Admitting: Radiation Oncology

## 2012-07-19 ENCOUNTER — Ambulatory Visit
Admission: RE | Admit: 2012-07-19 | Discharge: 2012-07-19 | Disposition: A | Discharge status: Home / Self Care | Payer: Medicare Other | Source: Ambulatory Visit | Attending: Radiation Oncology | Admitting: Radiation Oncology

## 2012-07-19 DIAGNOSIS — C61 Malignant neoplasm of prostate: Secondary | ICD-10-CM | POA: Diagnosis not present

## 2012-07-19 NOTE — Telephone Encounter (Signed)
INDIGENT APPROVED 100% FAMILY SIZE: 1 HH INC: 56213 MOD POV: 11490 VALID: 07/19/2012 - 01/16/2013  **PLEASE APPLY DISCOUNTY TO ANY PRIOR AND ALL CURRENT BILL DOS**   [facility] $400

## 2012-07-20 ENCOUNTER — Encounter: Payer: Self-pay | Admitting: Radiation Oncology

## 2012-07-20 ENCOUNTER — Ambulatory Visit
Admission: RE | Admit: 2012-07-20 | Discharge: 2012-07-20 | Disposition: A | Discharge status: Home / Self Care | Payer: Medicare Other | Source: Ambulatory Visit | Attending: Radiation Oncology | Admitting: Radiation Oncology

## 2012-07-20 VITALS — BP 135/79 | HR 66 | Resp 18 | Wt 206.2 lb

## 2012-07-20 DIAGNOSIS — C61 Malignant neoplasm of prostate: Secondary | ICD-10-CM | POA: Diagnosis not present

## 2012-07-20 NOTE — Progress Notes (Signed)
Received patient in the clinic today accompanied by his wife and daughter for a PUT with Dr. Kathrynn Running. Patient is alert and oriented to person, place, and time. No distress noted. Steady gait noted. Pleasant affect noted. Patient denies pain at this time. Stable weight noted. Patient denies burning with urination. Patient reports a normal strong urine stream. Patient reports urinary frequency. Patient denies hematuria. Patient reports on average he gets up maybe once per night. Patient denies diarrhea or painful bowel movements. Reported all findings to Dr. Kathrynn Running.

## 2012-07-21 ENCOUNTER — Encounter: Payer: Self-pay | Admitting: Radiation Oncology

## 2012-07-21 NOTE — Progress Notes (Signed)
  Radiation Oncology         [phone] ________________________________  Name: Tommy Bauer MRN: [id number]  Date: 07/20/2012  DOB: May 22, 1941  Weekly Radiation Therapy Management  Narrative . . . . . . . . The patient presents for routine under treatment assessment.      The patient is without complaint.                                 Set-up films were reviewed.                                 The chart was checked. Physical Findings. . . Weight essentially stable.  No significant changes. Impression . . . . . . . The patient is  tolerating radiation. Plan . . . . . . . . . . . . Continue treatment as planned.  ________________________________  Artist Pais. Kathrynn Running, M.D.

## 2012-07-23 ENCOUNTER — Ambulatory Visit
Admission: RE | Admit: 2012-07-23 | Discharge: 2012-07-23 | Disposition: A | Discharge status: Home / Self Care | Payer: Medicare Other | Source: Ambulatory Visit | Attending: Radiation Oncology | Admitting: Radiation Oncology

## 2012-07-23 DIAGNOSIS — C61 Malignant neoplasm of prostate: Secondary | ICD-10-CM | POA: Diagnosis not present

## 2012-07-24 ENCOUNTER — Ambulatory Visit
Admission: RE | Admit: 2012-07-24 | Discharge: 2012-07-24 | Disposition: A | Discharge status: Home / Self Care | Payer: Medicare Other | Source: Ambulatory Visit | Attending: Radiation Oncology | Admitting: Radiation Oncology

## 2012-07-24 DIAGNOSIS — C61 Malignant neoplasm of prostate: Secondary | ICD-10-CM | POA: Diagnosis not present

## 2012-07-25 ENCOUNTER — Ambulatory Visit
Admission: RE | Admit: 2012-07-25 | Discharge: 2012-07-25 | Disposition: A | Discharge status: Home / Self Care | Payer: Medicare Other | Source: Ambulatory Visit | Attending: Radiation Oncology | Admitting: Radiation Oncology

## 2012-07-25 DIAGNOSIS — C61 Malignant neoplasm of prostate: Secondary | ICD-10-CM | POA: Diagnosis not present

## 2012-07-26 ENCOUNTER — Ambulatory Visit: Admission: RE | Admit: 2012-07-26 | Payer: Medicare Other | Source: Ambulatory Visit

## 2012-07-27 ENCOUNTER — Ambulatory Visit: Payer: Medicare Other

## 2012-07-30 ENCOUNTER — Ambulatory Visit
Admission: RE | Admit: 2012-07-30 | Discharge: 2012-07-30 | Disposition: A | Discharge status: Home / Self Care | Payer: Medicare Other | Source: Ambulatory Visit | Attending: Radiation Oncology | Admitting: Radiation Oncology

## 2012-07-30 DIAGNOSIS — C61 Malignant neoplasm of prostate: Secondary | ICD-10-CM | POA: Diagnosis not present

## 2012-07-31 ENCOUNTER — Ambulatory Visit
Admission: RE | Admit: 2012-07-31 | Discharge: 2012-07-31 | Disposition: A | Discharge status: Home / Self Care | Payer: Medicare Other | Source: Ambulatory Visit | Attending: Radiation Oncology | Admitting: Radiation Oncology

## 2012-07-31 DIAGNOSIS — Z79899 Other long term (current) drug therapy: Secondary | ICD-10-CM | POA: Diagnosis not present

## 2012-07-31 DIAGNOSIS — C61 Malignant neoplasm of prostate: Secondary | ICD-10-CM | POA: Diagnosis not present

## 2012-08-01 ENCOUNTER — Ambulatory Visit
Admission: RE | Admit: 2012-08-01 | Discharge: 2012-08-01 | Disposition: A | Discharge status: Home / Self Care | Payer: Medicare Other | Source: Ambulatory Visit | Attending: Radiation Oncology | Admitting: Radiation Oncology

## 2012-08-01 DIAGNOSIS — C61 Malignant neoplasm of prostate: Secondary | ICD-10-CM | POA: Diagnosis not present

## 2012-08-02 ENCOUNTER — Ambulatory Visit
Admission: RE | Admit: 2012-08-02 | Discharge: 2012-08-02 | Disposition: A | Discharge status: Home / Self Care | Payer: Medicare Other | Source: Ambulatory Visit | Attending: Radiation Oncology | Admitting: Radiation Oncology

## 2012-08-02 ENCOUNTER — Encounter: Payer: Self-pay | Admitting: Radiation Oncology

## 2012-08-02 VITALS — BP 121/69 | HR 68 | Temp 98.0°F | Resp 20 | Wt 206.8 lb

## 2012-08-02 DIAGNOSIS — C61 Malignant neoplasm of prostate: Secondary | ICD-10-CM

## 2012-08-02 NOTE — Patient Instructions (Signed)
Drink 6-8 glasses of water daily.

## 2012-08-02 NOTE — Progress Notes (Signed)
  Radiation Oncology         [phone]) [phone] ________________________________  Name: Tommy Bauer MRN: [id number]  Date: 08/02/2012  DOB: Jun 16, 1941  Weekly Radiation Therapy Management  71 year-old gentleman s/p radical prostatectomy for pT3a adenocarcinoma with a Gleason's Score of 4+3, with extracapsular extension noted on pathology, and rising PSA of 0.36 15/38 radiation treatments completed.  Continuing as planned.    Current Dose: 27 Gy     Planned Dose:  68.4 Gy  Narrative . . . . . . . . The patient presents for routine under treatment assessment.                       Pt reports nocturia x 3, drinks lots of water; advised he not drink 2 hrs before bedtime to see if this helps. Pt also states his BM are getting firmer; he eats lots of fruits and vegetables. Advised he can take OTC stool softeners if he develops constipation. No other c/o verbalized.                                 Set-up films were reviewed.                                 The chart was checked. Physical Findings. . . Weight essentially stable.  No significant changes. Impression . . . . . . . The patient is  tolerating radiation. Plan . . . . . . . . . . . . Continue treatment as planned.  ________________________________  Artist Pais. Kathrynn Running, M.D.

## 2012-08-02 NOTE — Progress Notes (Signed)
Pt reports nocturia x 3, drinks lots of water; advised he not drink 2 hrs before bedtime to see if this helps. Pt also states his BM are getting firmer; he eats lots of fruits and vegetables. Advised he can take OTC stool softeners if he develops constipation. No other c/o verbalized.

## 2012-08-02 NOTE — Assessment & Plan Note (Signed)
15/38 radiation treatments completed.  Continuing as planned.

## 2012-08-03 ENCOUNTER — Ambulatory Visit
Admission: RE | Admit: 2012-08-03 | Discharge: 2012-08-03 | Disposition: A | Discharge status: Home / Self Care | Payer: Medicare Other | Source: Ambulatory Visit | Attending: Radiation Oncology | Admitting: Radiation Oncology

## 2012-08-03 DIAGNOSIS — C61 Malignant neoplasm of prostate: Secondary | ICD-10-CM | POA: Diagnosis not present

## 2012-08-06 ENCOUNTER — Ambulatory Visit
Admission: RE | Admit: 2012-08-06 | Discharge: 2012-08-06 | Disposition: A | Discharge status: Home / Self Care | Payer: Medicare Other | Source: Ambulatory Visit | Attending: Radiation Oncology | Admitting: Radiation Oncology

## 2012-08-06 DIAGNOSIS — C61 Malignant neoplasm of prostate: Secondary | ICD-10-CM | POA: Diagnosis not present

## 2012-08-07 ENCOUNTER — Ambulatory Visit
Admission: RE | Admit: 2012-08-07 | Discharge: 2012-08-07 | Disposition: A | Discharge status: Home / Self Care | Payer: Medicare Other | Source: Ambulatory Visit | Attending: Radiation Oncology | Admitting: Radiation Oncology

## 2012-08-07 DIAGNOSIS — C61 Malignant neoplasm of prostate: Secondary | ICD-10-CM | POA: Insufficient documentation

## 2012-08-07 DIAGNOSIS — Z79899 Other long term (current) drug therapy: Secondary | ICD-10-CM | POA: Diagnosis not present

## 2012-08-08 ENCOUNTER — Ambulatory Visit
Admission: RE | Admit: 2012-08-08 | Discharge: 2012-08-08 | Disposition: A | Discharge status: Home / Self Care | Payer: Medicare Other | Source: Ambulatory Visit | Attending: Radiation Oncology | Admitting: Radiation Oncology

## 2012-08-08 DIAGNOSIS — C61 Malignant neoplasm of prostate: Secondary | ICD-10-CM | POA: Diagnosis not present

## 2012-08-08 DIAGNOSIS — Z79899 Other long term (current) drug therapy: Secondary | ICD-10-CM | POA: Diagnosis not present

## 2012-08-09 ENCOUNTER — Encounter: Payer: Self-pay | Admitting: Radiation Oncology

## 2012-08-09 ENCOUNTER — Ambulatory Visit
Admission: RE | Admit: 2012-08-09 | Discharge: 2012-08-09 | Disposition: A | Discharge status: Home / Self Care | Payer: Medicare Other | Source: Ambulatory Visit | Attending: Radiation Oncology | Admitting: Radiation Oncology

## 2012-08-09 VITALS — BP 117/66 | HR 59 | Temp 98.9°F | Resp 20 | Wt 207.5 lb

## 2012-08-09 DIAGNOSIS — C61 Malignant neoplasm of prostate: Secondary | ICD-10-CM | POA: Diagnosis not present

## 2012-08-09 DIAGNOSIS — Z79899 Other long term (current) drug therapy: Secondary | ICD-10-CM | POA: Diagnosis not present

## 2012-08-09 NOTE — Progress Notes (Signed)
   Department of Radiation Oncology  Phone:  [phone] Fax:        [phone]  Weekly Treatment Note    Name: Tommy Bauer Date: 08/09/2012 MRN: [id number] DOB: [birth year]   Current dose: 36 Gy  Current fraction: 20   MEDICATIONS: Current Outpatient Prescriptions  Medication Sig Dispense Refill  . acetaminophen (TYLENOL) 500 MG tablet Take 500 mg by mouth every 4 (four) hours as needed.      . benazepril (LOTENSIN) 40 MG tablet Take 1 tablet (40 mg total) by mouth daily.  90 tablet  6  . celecoxib (CELEBREX) 100 MG capsule Take 1 capsule (100 mg total) by mouth daily.  90 capsule  3  . chlorthalidone (HYGROTON) 25 MG tablet Take 1 tablet (25 mg total) by mouth daily.  90 tablet  6  . diclofenac sodium (VOLTAREN) 1 % GEL Apply topically 2 (two) times daily as needed. Shoulder and knee prn      . metoprolol succinate (TOPROL-XL) 100 MG 24 hr tablet Take 1 tablet (100 mg total) by mouth daily. Take with or immediately following a meal.  90 tablet  3  . peg 3350 powder (MOVIPREP) 100 G SOLR Moviprep-take as directed.  1 kit  0  . potassium chloride SA (K-DUR,KLOR-CON) 20 MEQ tablet Take 1 tablet (20 mEq total) by mouth daily.  90 tablet  3  . sildenafil (VIAGRA) 100 MG tablet Take 100 mg by mouth daily as needed. As directed       . traMADol (ULTRAM) 50 MG tablet Take 1 tablet (50 mg total) by mouth every 6 (six) hours as needed for pain.  20 tablet  0     ALLERGIES: Review of patient's allergies indicates no known allergies.   LABORATORY DATA:  Lab Results  Component Value Date   WBC 4.8 05/20/2011   HGB 14.8 05/20/2011   HCT 44.3 05/20/2011   MCV 92.3 05/20/2011   PLT 212.0 05/20/2011   Lab Results  Component Value Date   NA 142 05/20/2011   K 3.1* 05/20/2011   CL 101 05/20/2011   CO2 34* 05/20/2011   Lab Results  Component Value Date   ALT 12 05/19/2010   AST 20 05/19/2010   ALKPHOS 39 05/19/2010   BILITOT 1.0 05/19/2010     NARRATIVE: Tommy Bauer was seen today  for weekly treatment management. The chart was checked and the patient's films were reviewed. The patient states that he is doing well with regards to his treatment. He has no urinary or bowel issues at this time area he does state that he is fatigued and has had some occasional difficulties with sleep. He states that he has a lot on his mind. Nursing had discussed him using an over-the-counter medicine which he was going to discuss with his pharmacist.  PHYSICAL EXAMINATION: weight is 207 lb 8 oz (94.121 kg). His temperature is 98.9 F (37.2 C). His blood pressure is 117/66 and his pulse is 59. His respiration is 20.        ASSESSMENT: The patient is doing satisfactorily with treatment.  PLAN: We will continue with the patient's radiation treatment as planned. The patient is doing quite well it appears and we did not make any changes today. He is to let us know if he continues to have difficulties with sleep.

## 2012-08-09 NOTE — Progress Notes (Signed)
Pt denies urinary, bowel issues, pain, loss of appetite, is fatigued stating he is not sleeping well. Pt states "I have too much on my mind." Advised he speak with his pharmacist for OTC for sleep that would not interfere w/his prescription meds.

## 2012-08-10 ENCOUNTER — Ambulatory Visit
Admission: RE | Admit: 2012-08-10 | Discharge: 2012-08-10 | Disposition: A | Discharge status: Home / Self Care | Payer: Medicare Other | Source: Ambulatory Visit | Attending: Radiation Oncology | Admitting: Radiation Oncology

## 2012-08-10 ENCOUNTER — Telehealth: Payer: Self-pay | Admitting: Radiation Oncology

## 2012-08-10 DIAGNOSIS — C61 Malignant neoplasm of prostate: Secondary | ICD-10-CM | POA: Diagnosis not present

## 2012-08-10 DIAGNOSIS — Z79899 Other long term (current) drug therapy: Secondary | ICD-10-CM | POA: Diagnosis not present

## 2012-08-10 NOTE — Telephone Encounter (Signed)
Pt brought MCD request list from [employer] and wanted help. His caseworker name is Iva Heyward. Pt does not read or understand clearly. So, I gathered all the information need for the caseworker and will mail to her today.  Iva contact nbr: [id number] [place] Case No. [id number] [place]

## 2012-08-13 ENCOUNTER — Ambulatory Visit
Admission: RE | Admit: 2012-08-13 | Discharge: 2012-08-13 | Disposition: A | Discharge status: Home / Self Care | Payer: Medicare Other | Source: Ambulatory Visit | Attending: Radiation Oncology | Admitting: Radiation Oncology

## 2012-08-13 DIAGNOSIS — Z79899 Other long term (current) drug therapy: Secondary | ICD-10-CM | POA: Diagnosis not present

## 2012-08-13 DIAGNOSIS — C61 Malignant neoplasm of prostate: Secondary | ICD-10-CM | POA: Diagnosis not present

## 2012-08-14 ENCOUNTER — Ambulatory Visit
Admission: RE | Admit: 2012-08-14 | Discharge: 2012-08-14 | Disposition: A | Discharge status: Home / Self Care | Payer: Medicare Other | Source: Ambulatory Visit | Attending: Radiation Oncology | Admitting: Radiation Oncology

## 2012-08-14 DIAGNOSIS — C61 Malignant neoplasm of prostate: Secondary | ICD-10-CM | POA: Diagnosis not present

## 2012-08-14 DIAGNOSIS — Z79899 Other long term (current) drug therapy: Secondary | ICD-10-CM | POA: Diagnosis not present

## 2012-08-15 ENCOUNTER — Ambulatory Visit
Admission: RE | Admit: 2012-08-15 | Discharge: 2012-08-15 | Disposition: A | Discharge status: Home / Self Care | Payer: Medicare Other | Source: Ambulatory Visit | Attending: Radiation Oncology | Admitting: Radiation Oncology

## 2012-08-15 DIAGNOSIS — C61 Malignant neoplasm of prostate: Secondary | ICD-10-CM | POA: Diagnosis not present

## 2012-08-15 DIAGNOSIS — Z79899 Other long term (current) drug therapy: Secondary | ICD-10-CM | POA: Diagnosis not present

## 2012-08-16 ENCOUNTER — Ambulatory Visit
Admission: RE | Admit: 2012-08-16 | Discharge: 2012-08-16 | Disposition: A | Discharge status: Home / Self Care | Payer: Medicare Other | Source: Ambulatory Visit | Attending: Radiation Oncology | Admitting: Radiation Oncology

## 2012-08-16 DIAGNOSIS — Z79899 Other long term (current) drug therapy: Secondary | ICD-10-CM | POA: Diagnosis not present

## 2012-08-16 DIAGNOSIS — C61 Malignant neoplasm of prostate: Secondary | ICD-10-CM | POA: Diagnosis not present

## 2012-08-17 ENCOUNTER — Encounter: Payer: Self-pay | Admitting: Radiation Oncology

## 2012-08-17 ENCOUNTER — Ambulatory Visit
Admission: RE | Admit: 2012-08-17 | Discharge: 2012-08-17 | Disposition: A | Discharge status: Home / Self Care | Payer: Medicare Other | Source: Ambulatory Visit | Attending: Radiation Oncology | Admitting: Radiation Oncology

## 2012-08-17 VITALS — BP 138/70 | HR 62 | Temp 98.0°F | Resp 20 | Wt 207.6 lb

## 2012-08-17 DIAGNOSIS — C61 Malignant neoplasm of prostate: Secondary | ICD-10-CM

## 2012-08-17 DIAGNOSIS — Z79899 Other long term (current) drug therapy: Secondary | ICD-10-CM | POA: Diagnosis not present

## 2012-08-17 NOTE — Progress Notes (Signed)
  Radiation Oncology         ([phone]) [phone] ________________________________  Name: Tommy Bauer MRN: [id number]  Date: 08/14/2012  DOB: Jul 16, 1941  Weekly Radiation Therapy Management  Narrative . . . . . . . . The patient presents for routine under treatment assessment.                                                      The patient is without complaint.                                 Set-up films were reviewed.                                 The chart was checked. Physical Findings. . . . Weight essentially stable.  No significant changes. Impression . . . . . . . The patient is  tolerating radiation. Plan . . . . . . . . . . . . Continue treatment as planned.  ________________________________  Artist Pais. Kathrynn Running, M.D.

## 2012-08-17 NOTE — Progress Notes (Signed)
Pt reports fatigue, nocturia x 4, denies urinary freq in daytime, denies dysuria, does report rectal pain w/BM's but improved w/stool softeners. Pt has no loss of appetite.

## 2012-08-17 NOTE — Progress Notes (Signed)
  Radiation Oncology         ([phone]) [phone] ________________________________  Name: Tommy Bauer MRN: [id number]  Date: 08/17/2012  DOB: 05/29/1941  Weekly Radiation Therapy Management  Current Dose: 46.8 Gy     Planned Dose:  68.4 Gy  Narrative . . . . . . . . The patient presents for routine under treatment assessment.                                                  Pt reports fatigue, nocturia x 4, denies urinary freq in daytime, denies dysuria, does report rectal pain w/BM's but improved w/stool softeners. Pt has no loss of appetite    The patient is without complaint.                                 Set-up films were reviewed.                                 The chart was checked. Physical Findings. . .  weight is 207 lb 9.6 oz (94.167 kg). His oral temperature is 98 F (36.7 C). His blood pressure is 138/70 and his pulse is 62. His respiration is 20. . Weight essentially stable.  No significant changes. Impression . . . . . . . The patient is  tolerating radiation. Plan . . . . . . . . . . . . Continue treatment as planned.  We discussed ED today and I recommended he see his urologist.  ________________________________  Artist Pais. Kathrynn Running, M.D.

## 2012-08-20 ENCOUNTER — Ambulatory Visit
Admission: RE | Admit: 2012-08-20 | Discharge: 2012-08-20 | Disposition: A | Discharge status: Home / Self Care | Payer: Medicare Other | Source: Ambulatory Visit | Attending: Radiation Oncology | Admitting: Radiation Oncology

## 2012-08-20 DIAGNOSIS — Z79899 Other long term (current) drug therapy: Secondary | ICD-10-CM | POA: Diagnosis not present

## 2012-08-20 DIAGNOSIS — C61 Malignant neoplasm of prostate: Secondary | ICD-10-CM | POA: Diagnosis not present

## 2012-08-21 ENCOUNTER — Ambulatory Visit
Admission: RE | Admit: 2012-08-21 | Discharge: 2012-08-21 | Disposition: A | Discharge status: Home / Self Care | Payer: Medicare Other | Source: Ambulatory Visit | Attending: Radiation Oncology | Admitting: Radiation Oncology

## 2012-08-21 DIAGNOSIS — C61 Malignant neoplasm of prostate: Secondary | ICD-10-CM | POA: Diagnosis not present

## 2012-08-21 DIAGNOSIS — Z79899 Other long term (current) drug therapy: Secondary | ICD-10-CM | POA: Diagnosis not present

## 2012-08-22 ENCOUNTER — Ambulatory Visit: Payer: Medicare Other

## 2012-08-22 ENCOUNTER — Ambulatory Visit
Admission: RE | Admit: 2012-08-22 | Discharge: 2012-08-22 | Disposition: A | Discharge status: Home / Self Care | Payer: Medicare Other | Source: Ambulatory Visit | Attending: Radiation Oncology | Admitting: Radiation Oncology

## 2012-08-22 DIAGNOSIS — Z79899 Other long term (current) drug therapy: Secondary | ICD-10-CM | POA: Diagnosis not present

## 2012-08-22 DIAGNOSIS — C61 Malignant neoplasm of prostate: Secondary | ICD-10-CM | POA: Diagnosis not present

## 2012-08-23 ENCOUNTER — Ambulatory Visit
Admission: RE | Admit: 2012-08-23 | Discharge: 2012-08-23 | Disposition: A | Discharge status: Home / Self Care | Payer: Medicare Other | Source: Ambulatory Visit | Attending: Radiation Oncology | Admitting: Radiation Oncology

## 2012-08-23 ENCOUNTER — Ambulatory Visit: Payer: Medicare Other

## 2012-08-23 ENCOUNTER — Encounter: Payer: Self-pay | Admitting: Radiation Oncology

## 2012-08-23 VITALS — BP 125/71 | HR 67 | Temp 98.3°F | Resp 20 | Wt 206.4 lb

## 2012-08-23 DIAGNOSIS — Z79899 Other long term (current) drug therapy: Secondary | ICD-10-CM | POA: Diagnosis not present

## 2012-08-23 DIAGNOSIS — C61 Malignant neoplasm of prostate: Secondary | ICD-10-CM | POA: Diagnosis not present

## 2012-08-23 NOTE — Progress Notes (Signed)
Pt denies pain, urinary urgency, dysuria, bowel issues, fatigue, loss of appetite. Pt reports nocturia x 3-4

## 2012-08-23 NOTE — Progress Notes (Signed)
  Radiation Oncology         [phone]) [phone] ________________________________  Name: Tommy Bauer MRN: [id number]  Date: 08/23/2012  DOB: Jan 06, 1941  Weekly Radiation Therapy Management  Current Dose: 54 Gy     Planned Dose:  68.4 Gy  Narrative . . . . . . . . The patient presents for routine under treatment assessment.    Pt denies pain, urinary urgency, dysuria, bowel issues, fatigue, loss of appetite. Pt reports nocturia x 3-4 The patient is without complaint.                                 Set-up films were reviewed.                                 The chart was checked. Physical Findings. . .  weight is 206 lb 6.4 oz (93.622 kg). His oral temperature is 98.3 F (36.8 C). His blood pressure is 125/71 and his pulse is 67. His respiration is 20. . Weight essentially stable.  No significant changes. Impression . . . . . . . The patient is  tolerating radiation. Plan . . . . . . . . . . . . Continue treatment as planned.  ________________________________  Artist Pais. Kathrynn Running, M.D.

## 2012-08-24 ENCOUNTER — Ambulatory Visit: Payer: Medicare Other

## 2012-08-24 ENCOUNTER — Ambulatory Visit
Admission: RE | Admit: 2012-08-24 | Discharge: 2012-08-24 | Disposition: A | Discharge status: Home / Self Care | Payer: Medicare Other | Source: Ambulatory Visit | Attending: Radiation Oncology | Admitting: Radiation Oncology

## 2012-08-24 DIAGNOSIS — C61 Malignant neoplasm of prostate: Secondary | ICD-10-CM | POA: Diagnosis not present

## 2012-08-24 DIAGNOSIS — Z79899 Other long term (current) drug therapy: Secondary | ICD-10-CM | POA: Diagnosis not present

## 2012-08-27 ENCOUNTER — Ambulatory Visit: Payer: Medicare Other

## 2012-08-27 ENCOUNTER — Ambulatory Visit
Admission: RE | Admit: 2012-08-27 | Discharge: 2012-08-27 | Disposition: A | Discharge status: Home / Self Care | Payer: Medicare Other | Source: Ambulatory Visit | Attending: Radiation Oncology | Admitting: Radiation Oncology

## 2012-08-27 DIAGNOSIS — C61 Malignant neoplasm of prostate: Secondary | ICD-10-CM | POA: Diagnosis not present

## 2012-08-27 DIAGNOSIS — Z79899 Other long term (current) drug therapy: Secondary | ICD-10-CM | POA: Diagnosis not present

## 2012-08-28 ENCOUNTER — Ambulatory Visit: Payer: Medicare Other

## 2012-08-28 ENCOUNTER — Ambulatory Visit
Admission: RE | Admit: 2012-08-28 | Discharge: 2012-08-28 | Disposition: A | Discharge status: Home / Self Care | Payer: Medicare Other | Source: Ambulatory Visit | Attending: Radiation Oncology | Admitting: Radiation Oncology

## 2012-08-28 DIAGNOSIS — C61 Malignant neoplasm of prostate: Secondary | ICD-10-CM | POA: Diagnosis not present

## 2012-08-28 DIAGNOSIS — Z79899 Other long term (current) drug therapy: Secondary | ICD-10-CM | POA: Diagnosis not present

## 2012-08-29 ENCOUNTER — Ambulatory Visit
Admission: RE | Admit: 2012-08-29 | Discharge: 2012-08-29 | Disposition: A | Discharge status: Home / Self Care | Payer: Medicare Other | Source: Ambulatory Visit | Attending: Radiation Oncology | Admitting: Radiation Oncology

## 2012-08-29 ENCOUNTER — Ambulatory Visit: Payer: Medicare Other

## 2012-08-29 DIAGNOSIS — Z79899 Other long term (current) drug therapy: Secondary | ICD-10-CM | POA: Diagnosis not present

## 2012-08-29 DIAGNOSIS — C61 Malignant neoplasm of prostate: Secondary | ICD-10-CM | POA: Diagnosis not present

## 2012-08-30 ENCOUNTER — Ambulatory Visit
Admission: RE | Admit: 2012-08-30 | Discharge: 2012-08-30 | Disposition: A | Discharge status: Home / Self Care | Payer: Medicare Other | Source: Ambulatory Visit | Attending: Radiation Oncology | Admitting: Radiation Oncology

## 2012-08-30 ENCOUNTER — Ambulatory Visit: Payer: Medicare Other

## 2012-08-30 VITALS — BP 119/69 | HR 70 | Temp 99.4°F | Wt 207.9 lb

## 2012-08-30 DIAGNOSIS — C61 Malignant neoplasm of prostate: Secondary | ICD-10-CM | POA: Diagnosis not present

## 2012-08-30 DIAGNOSIS — Z79899 Other long term (current) drug therapy: Secondary | ICD-10-CM | POA: Diagnosis not present

## 2012-08-30 NOTE — Progress Notes (Signed)
  Radiation Oncology         [phone] ________________________________  Name: Linford Quintela MRN: [id number]  Date: 08/30/2012  DOB: [birth year]  Weekly Radiation Therapy Management  Current Dose: 63 Gy     Planned Dose:  68.4 Gy  Narrative . . . . . . . . The patient presents for routine under treatment assessment.                                                     The patient is without complaint.                                 Set-up films were reviewed.                                 The chart was checked. Physical Findings. . .  weight is 207 lb 14.4 oz (94.303 kg). His temperature is 99.4 F (37.4 C). His blood pressure is 119/69 and his pulse is 70. . Weight essentially stable.  No significant changes. Impression . . . . . . . The patient is  tolerating radiation. Plan . . . . . . . . . . . . Continue treatment as planned.  ________________________________  Artist Pais. Kathrynn Running, M.D.

## 2012-08-30 NOTE — Progress Notes (Signed)
Patient here for weekly under treat visit for prostate fossa treatment.Has completed 35 of 38 treatments.Denies pain.Has frequency of urination with nocturia up to 3 times.Occasional fatigue.

## 2012-08-31 ENCOUNTER — Ambulatory Visit: Payer: Medicare Other

## 2012-08-31 ENCOUNTER — Ambulatory Visit
Admission: RE | Admit: 2012-08-31 | Discharge: 2012-08-31 | Disposition: A | Discharge status: Home / Self Care | Payer: Medicare Other | Source: Ambulatory Visit | Attending: Radiation Oncology | Admitting: Radiation Oncology

## 2012-08-31 DIAGNOSIS — Z79899 Other long term (current) drug therapy: Secondary | ICD-10-CM | POA: Diagnosis not present

## 2012-08-31 DIAGNOSIS — C61 Malignant neoplasm of prostate: Secondary | ICD-10-CM | POA: Diagnosis not present

## 2012-09-03 ENCOUNTER — Ambulatory Visit
Admission: RE | Admit: 2012-09-03 | Discharge: 2012-09-03 | Disposition: A | Discharge status: Home / Self Care | Payer: Medicare Other | Source: Ambulatory Visit | Attending: Radiation Oncology | Admitting: Radiation Oncology

## 2012-09-03 ENCOUNTER — Ambulatory Visit: Payer: Medicare Other

## 2012-09-03 DIAGNOSIS — Z79899 Other long term (current) drug therapy: Secondary | ICD-10-CM | POA: Diagnosis not present

## 2012-09-03 DIAGNOSIS — C61 Malignant neoplasm of prostate: Secondary | ICD-10-CM | POA: Diagnosis not present

## 2012-09-04 ENCOUNTER — Ambulatory Visit: Payer: Medicare Other

## 2012-09-04 ENCOUNTER — Encounter: Payer: Self-pay | Admitting: Radiation Oncology

## 2012-09-04 ENCOUNTER — Ambulatory Visit
Admission: RE | Admit: 2012-09-04 | Discharge: 2012-09-04 | Disposition: A | Discharge status: Home / Self Care | Payer: Medicare Other | Source: Ambulatory Visit | Attending: Radiation Oncology | Admitting: Radiation Oncology

## 2012-09-04 VITALS — BP 119/68 | HR 64 | Temp 98.6°F | Resp 20 | Wt 209.4 lb

## 2012-09-04 DIAGNOSIS — C61 Malignant neoplasm of prostate: Secondary | ICD-10-CM | POA: Diagnosis not present

## 2012-09-04 DIAGNOSIS — Z79899 Other long term (current) drug therapy: Secondary | ICD-10-CM | POA: Diagnosis not present

## 2012-09-04 NOTE — Progress Notes (Signed)
Pt completed tx to prostate today. Gave pt FU card. He denies pain, fatigue. He reports nocturia x 3, burning w/BM's, has 2 normal BM's daily. Pt denies urinary freq during day, dysuria, other urinary issues.

## 2012-09-04 NOTE — Progress Notes (Signed)
[facility]    Radiation Oncology [address] [place]     Maryln Gottron, M.D. [place], [place] [address]               Billie Lade, M.D., Ph.D. Phone: [phone]      Molli Hazard A. Kathrynn Running, M.D. Fax: [phone]      Radene Gunning, M.D., Ph.D.         Lurline Hare, M.D.         Grayland Jack, M.D Weekly Treatment Management Note  Name: Tommy Bauer     MRN: [id number]        CSN: [id number] Date: 09/04/2012      DOB: 03-23-1941  CC: [name], MD         Amador Cunas    Status: Outpatient  Diagnosis: The encounter diagnosis was 71 year-old gentleman s/p radical prostatectomy for pT3a adenocarcinoma with a Gleason's Score of 4+3, with extracapsular extension noted on pathology, and rising PSA of 0.36.  Current Dose: 6840 cGy  Current Fraction: 38/38  Planned Dose: 6840 cGy  Narrative: Tommy Bauer was seen today for weekly treatment management. The chart was checked and MVCT  were reviewed. He is happy to complete his radiation therapy today. He does have  nocturia x3. He denies any urinary frequency during the day or dysuria. Patient denies any bowel complaints this time.  Review of patient's allergies indicates no known allergies.  Current Outpatient Prescriptions  Medication Sig Dispense Refill  . acetaminophen (TYLENOL) 500 MG tablet Take 500 mg by mouth every 4 (four) hours as needed.      . benazepril (LOTENSIN) 40 MG tablet Take 1 tablet (40 mg total) by mouth daily.  90 tablet  6  . celecoxib (CELEBREX) 100 MG capsule Take 1 capsule (100 mg total) by mouth daily.  90 capsule  3  . chlorthalidone (HYGROTON) 25 MG tablet Take 1 tablet (25 mg total) by mouth daily.  90 tablet  6  . diclofenac sodium (VOLTAREN) 1 % GEL Apply topically 2 (two) times daily as needed. Shoulder and knee prn      . Docusate Calcium (STOOL SOFTENER PO) Take by mouth.      . metoprolol succinate (TOPROL-XL) 100 MG 24 hr tablet Take 1 tablet (100 mg total) by mouth  daily. Take with or immediately following a meal.  90 tablet  3  . peg 3350 powder (MOVIPREP) 100 G SOLR Moviprep-take as directed.  1 kit  0  . potassium chloride SA (K-DUR,KLOR-CON) 20 MEQ tablet Take 1 tablet (20 mEq total) by mouth daily.  90 tablet  3  . sildenafil (VIAGRA) 100 MG tablet Take 100 mg by mouth daily as needed. As directed       . traMADol (ULTRAM) 50 MG tablet Take 1 tablet (50 mg total) by mouth every 6 (six) hours as needed for pain.  20 tablet  0   Labs:  Lab Results  Component Value Date   WBC 4.8 05/20/2011   HGB 14.8 05/20/2011   HCT 44.3 05/20/2011   MCV 92.3 05/20/2011   PLT 212.0 05/20/2011   Lab Results  Component Value Date   CREATININE 1.1 05/20/2011   BUN 13 05/20/2011   NA 142 05/20/2011   K 3.1* 05/20/2011   CL 101 05/20/2011   CO2 34* 05/20/2011   Lab Results  Component Value Date   ALT 12 05/19/2010   AST 20 05/19/2010   BILITOT 1.0 05/19/2010  Physical Examination:  weight is 209 lb 6.4 oz (94.983 kg). His oral temperature is 98.6 F (37 C). His blood pressure is 119/68 and his pulse is 64. His respiration is 20.    Wt Readings from Last 3 Encounters:  09/04/12 209 lb 6.4 oz (94.983 kg)  08/30/12 207 lb 14.4 oz (94.303 kg)  08/23/12 206 lb 6.4 oz (93.622 kg)     Lungs - Normal respiratory effort, chest expands symmetrically. Lungs are clear to auscultation, no crackles or wheezes.  Heart has regular rhythm and rate  Abdomen is soft and non tender with normal bowel sounds  Assessment:  Patient tolerated treatments well  Plan: Routine followup with Dr. Kathrynn Running in one month.

## 2012-09-05 NOTE — Progress Notes (Signed)
  Radiation Oncology         [phone]) [phone] ________________________________  Name: Tommy Bauer MRN: [id number]  Date: 09/04/2012  DOB: 08/24/1941  End of Treatment Note  Diagnosis: 71 yo man with rising PSA of 0.36 after radical prostatectomy with extracapsular extension noted on pathology  Indication for treatment:  Curative, Salvage       Radiation treatment dates:  07/11/2012-09/04/2012  Site/dose:   The prostate fossa was treated to 68.4 gray in 38 fractions of 1.8 gray  Beams/energy:   The patient was set up daily using a Vac lock leg positioner for precise immobilization.  Each day, the patient underwent megavoltage CT imaging to position the target volume prior to treatment. He was treated with a partially full bladder. The patient's treatment was delivered using 6 megavolt photons in a helical intensity modulated radiotherapy fashion on the TomoTherapy unit.  Narrative: The patient tolerated radiation treatment relatively well.   He experienced mild dysuria and radiation related fatigue.  Plan: The patient has completed radiation treatment. The patient will return to radiation oncology clinic for routine followup in one month. I advised them to call or return sooner if they have any questions or concerns related to their recovery or treatment. ________________________________  Artist Pais. Kathrynn Running, M.D.

## 2012-09-12 ENCOUNTER — Ambulatory Visit (INDEPENDENT_AMBULATORY_CARE_PROVIDER_SITE_OTHER): Payer: Medicare Other | Admitting: Internal Medicine

## 2012-09-12 ENCOUNTER — Encounter: Payer: Self-pay | Admitting: Internal Medicine

## 2012-09-12 VITALS — BP 150/80 | HR 56 | Temp 98.1°F | Resp 16 | Ht 73.5 in | Wt 205.0 lb

## 2012-09-12 DIAGNOSIS — Z86010 Personal history of colonic polyps: Secondary | ICD-10-CM

## 2012-09-12 DIAGNOSIS — I2510 Atherosclerotic heart disease of native coronary artery without angina pectoris: Secondary | ICD-10-CM | POA: Diagnosis not present

## 2012-09-12 DIAGNOSIS — I10 Essential (primary) hypertension: Secondary | ICD-10-CM | POA: Diagnosis not present

## 2012-09-12 DIAGNOSIS — F17200 Nicotine dependence, unspecified, uncomplicated: Secondary | ICD-10-CM

## 2012-09-12 DIAGNOSIS — Z23 Encounter for immunization: Secondary | ICD-10-CM

## 2012-09-12 DIAGNOSIS — Z0000 Encounter for general adult medical examination without abnormal findings: Secondary | ICD-10-CM | POA: Diagnosis not present

## 2012-09-12 DIAGNOSIS — C61 Malignant neoplasm of prostate: Secondary | ICD-10-CM

## 2012-09-12 LAB — CBC WITH DIFFERENTIAL/PLATELET
Basophils Absolute: 0 10*3/uL (ref 0.0–0.1)
Hemoglobin: 15.3 g/dL (ref 13.0–17.0)
Lymphs Abs: 0.7 10*3/uL (ref 0.7–4.0)
Monocytes Absolute: 0.5 10*3/uL (ref 0.1–1.0)
Platelets: 185 10*3/uL (ref 150.0–400.0)
RBC: 4.99 Mil/uL (ref 4.22–5.81)
RDW: 14.8 % — ABNORMAL HIGH (ref 11.5–14.6)
WBC: 3.8 10*3/uL — ABNORMAL LOW (ref 4.5–10.5)

## 2012-09-12 LAB — COMPREHENSIVE METABOLIC PANEL
AST: 17 U/L (ref 0–37)
Albumin: 4.3 g/dL (ref 3.5–5.2)
BUN: 12 mg/dL (ref 6–23)
Calcium: 9.6 mg/dL (ref 8.4–10.5)
Chloride: 98 mEq/L (ref 96–112)
Creatinine, Ser: 1.1 mg/dL (ref 0.4–1.5)
Glucose, Bld: 87 mg/dL (ref 70–99)
Potassium: 3.5 mEq/L (ref 3.5–5.1)

## 2012-09-12 LAB — LIPID PANEL
Cholesterol: 185 mg/dL (ref 0–200)
LDL Cholesterol: 101 mg/dL — ABNORMAL HIGH (ref 0–99)
Triglycerides: 123 mg/dL (ref 0.0–149.0)
VLDL: 24.6 mg/dL (ref 0.0–40.0)

## 2012-09-12 LAB — TSH: TSH: 0.89 u[IU]/mL (ref 0.35–5.50)

## 2012-09-12 MED ORDER — CELECOXIB 100 MG PO CAPS: 100.0000 mg | ORAL_CAPSULE | Freq: Every day | ORAL | Status: DC

## 2012-09-12 MED ORDER — CHLORTHALIDONE 25 MG PO TABS: 25.0000 mg | ORAL_TABLET | Freq: Every day | ORAL | Status: DC

## 2012-09-12 MED ORDER — BENAZEPRIL HCL 40 MG PO TABS: 40.0000 mg | ORAL_TABLET | Freq: Every day | ORAL | Status: DC

## 2012-09-12 MED ORDER — METOPROLOL SUCCINATE ER 100 MG PO TB24: 100.0000 mg | ORAL_TABLET | Freq: Every day | ORAL | Status: DC

## 2012-09-12 MED ORDER — TRAMADOL HCL 50 MG PO TABS: 50.0000 mg | ORAL_TABLET | Freq: Four times a day (QID) | ORAL | Status: DC | PRN

## 2012-09-12 MED ORDER — POTASSIUM CHLORIDE CRYS ER 20 MEQ PO TBCR: 20.0000 meq | EXTENDED_RELEASE_TABLET | Freq: Every day | ORAL | Status: DC

## 2012-09-12 NOTE — Patient Instructions (Signed)
Limit your sodium (Salt) intake  Smoking tobacco is very bad for your health. You should stop smoking immediately.    It is important that you exercise regularly, at least 20 minutes 3 to 4 times per week.  If you develop chest pain or shortness of breath seek  medical attention.  Return in 6 months for follow-up  

## 2012-09-12 NOTE — Progress Notes (Signed)
Patient ID: Tommy Bauer, male   DOB: 05-15-1941, 71 y.o.   MRN: [id number]  Subjective:    Patient ID: Tommy Bauer, male    DOB: Feb 01, 1941, 71 y.o.   MRN: [id number]  HPI  CC: cpx- doing well.   History of Present Illness:   71 year-old patient who is seen today for a comprehensive evaluation. Medical problems include a history of LV dysfunction. He has hypertension, colonic polyps, and a history of ongoing tobacco use. He has cut his tobacco consumption down considerably after being treated with chanix earlier last  year. denies any symptoms of congestive heart failure. No exertional chest pain. He has decreased his tobacco use, to just a few cigarettes daily. Total cessation discussed and encouraged.  He has just completed radiation treatment for extra capsular extension of prostate cancer. Only complaints today include some mild rectal discomfort  Here for Medicare AWV:   1. Risk factors based on Past M, S, F history: patient has a history of LV dysfunction, hypertension, and tobacco use  2. Physical Activities: no limitations  3. Depression/mood: no history of depression or mood disorder  4. Hearing: no hearing deficits  5. ADL's: remains independent in all aspects of daily living  6. Fall Risk: low  7. Home Safety: no problems identified  8. Height, weight, &visual acuity:height and weight are stable. No difficulty with visual acuity  9. Counseling: total cessation of all tobacco products. Encouraged  10. Labs ordered based on risk factors: laboratory studies, including PSA will be reviewed  11. Referral Coordination-followed urology. This fall  12. Care Plan- heart healthy, salt restricted diet more regular exercise and modest weight loss. All encouraged  13. Cognitive Assessment- alert and oriented, with normal affect   Preventive Screening-Counseling & Management  Alcohol-Tobacco  Smoking Status: current  Smoking Cessation Counseling: yes   Allergies (verified):  No Known  Drug Allergies   Past History:  Past Medical History:  Reviewed history from 11/06/2009 and no changes required.  Colonic polyps, hx of ; colonoscopy may 2013 Hypertension  ED  LVD  R inguinal hernia-repaired  prostate cancer   Past Surgical History:  right inguinal hernia repair- 2009  Colonoscopy October2007 May 2013 Cardiolite stress test September 2007  status post radical prostatectomy November 2010 (T1C NO MO)   Family History:  Reviewed history from 05/01/2009 and no changes required.  details of his father's health unknown.  Mother died age 75, cancer, unclear type  III sisters, one deceased, age 2 cancer, unclear type;  another died Ca-leukemia  one brother died-trauma and secondary infection   Social History:  Reviewed history from 04/25/2008 and no changes required.  Current Smoker  Married  one son  still works doing Multimedia programmer      Review of Systems  Constitutional: Negative for fever, chills, activity change, appetite change and fatigue.  HENT: Negative for hearing loss, ear pain, congestion, rhinorrhea, sneezing, mouth sores, trouble swallowing, neck pain, neck stiffness, dental problem, voice change, sinus pressure and tinnitus.   Eyes: Negative for photophobia, pain, redness and visual disturbance.  Respiratory: Negative for apnea, cough, choking, chest tightness, shortness of breath and wheezing.   Cardiovascular: Negative for chest pain, palpitations and leg swelling.  Gastrointestinal: Negative for nausea, vomiting, abdominal pain, diarrhea, constipation, blood in stool, abdominal distention, anal bleeding and rectal pain.  Genitourinary: Negative for dysuria, urgency, frequency, hematuria, flank pain, decreased urine volume, discharge, penile swelling, scrotal swelling, difficulty urinating, genital sores and testicular pain.  Musculoskeletal: Negative  for myalgias, back pain, joint swelling, arthralgias and gait problem.  Skin: Negative  for color change, rash and wound.  Neurological: Negative for dizziness, tremors, seizures, syncope, facial asymmetry, speech difficulty, weakness, light-headedness, numbness and headaches.  Hematological: Negative for adenopathy. Does not bruise/bleed easily.  Psychiatric/Behavioral: Negative for suicidal ideas, hallucinations, behavioral problems, confusion, sleep disturbance, self-injury, dysphoric mood, decreased concentration and agitation. The patient is not nervous/anxious.        Objective:   Physical Exam  Constitutional: He appears well-developed and well-nourished. No distress.  HENT:  Head: Normocephalic and atraumatic.  Right Ear: External ear normal.  Left Ear: External ear normal.  Nose: Nose normal.  Mouth/Throat: Oropharynx is clear and moist.  Eyes: Conjunctivae normal and EOM are normal. Pupils are equal, round, and reactive to light. No scleral icterus.  Neck: Normal range of motion. Neck supple. No JVD present. No thyromegaly present.  Cardiovascular: Regular rhythm, normal heart sounds and intact distal pulses.  Exam reveals no gallop and no friction rub.   No murmur heard. Pulmonary/Chest: Effort normal and breath sounds normal. He exhibits no tenderness.  Abdominal: Soft. Bowel sounds are normal. He exhibits no distension and no mass. There is no tenderness.  Genitourinary: Penis normal. Guaiac negative stool.  Musculoskeletal: Normal range of motion. He exhibits no edema and no tenderness.  Lymphadenopathy:    He has no cervical adenopathy.  Neurological: He is alert. He has normal reflexes. No cranial nerve deficit. Coordination normal.  Skin: Skin is warm and dry. No rash noted.  Psychiatric: He has a normal mood and affect. His behavior is normal.          Assessment & Plan:    Preventive health Hypertension stable Left ventricular dysfunction. Asymptomatic History colonic polyps.  History of prostate cancer.  Followup urology in 6 months

## 2012-10-04 ENCOUNTER — Ambulatory Visit
Admission: RE | Admit: 2012-10-04 | Discharge: 2012-10-04 | Disposition: A | Discharge status: Home / Self Care | Payer: Medicare Other | Source: Ambulatory Visit | Attending: Radiation Oncology | Admitting: Radiation Oncology

## 2012-10-04 ENCOUNTER — Encounter: Payer: Self-pay | Admitting: Radiation Oncology

## 2012-10-04 VITALS — BP 125/69 | HR 76 | Temp 97.9°F | Resp 20 | Wt 208.3 lb

## 2012-10-04 DIAGNOSIS — C61 Malignant neoplasm of prostate: Secondary | ICD-10-CM

## 2012-10-04 NOTE — Progress Notes (Signed)
Pt denies pain, loss of appetite, fatigue, dysuria, urinary frequency. He reports nocturia x 3-4 but drinks water late at night. Pt states he was constipated, had burning w/BMs. He states this has resolved w/drinking chicken noodle soup.

## 2012-10-04 NOTE — Progress Notes (Signed)
Radiation Oncology         [phone] ________________________________  Name: Tommy Bauer MRN: [id number]  Date: 10/04/2012  DOB: 05/14/1941  Follow-Up Visit Note  CC: Tommy Boga, MD  Tommy Mc, MD  Diagnosis:   71 yo man with rising PSA of 0.36 after radical prostatectomy with extracapsular extension noted on pathology s/p Curative, Salvage radiotherapy to the prostate fossa to 68.4 gray in 38 fractions of 1.8 gray between 07/11/2012 and 09/04/2012  Interval Since Last Radiation:  1 months  Narrative:  The patient returns today for routine follow-up.  Pt denies pain, loss of appetite, fatigue, dysuria, urinary frequency. He reports nocturia x 3-4 but drinks water late at night. Pt states he was constipated, had burning w/BMs. He states this has resolved w/drinking chicken noodle soup                              ALLERGIES:   has no known allergies.  Meds: Current Outpatient Prescriptions  Medication Sig Dispense Refill  . acetaminophen (TYLENOL) 500 MG tablet Take 500 mg by mouth every 4 (four) hours as needed.      . benazepril (LOTENSIN) 40 MG tablet Take 1 tablet (40 mg total) by mouth daily.  90 tablet  6  . celecoxib (CELEBREX) 100 MG capsule Take 1 capsule (100 mg total) by mouth daily.  90 capsule  3  . chlorthalidone (HYGROTON) 25 MG tablet Take 1 tablet (25 mg total) by mouth daily.  90 tablet  6  . diclofenac sodium (VOLTAREN) 1 % GEL Apply topically 2 (two) times daily as needed. Shoulder and knee prn      . Docusate Calcium (STOOL SOFTENER PO) Take by mouth.      . metoprolol succinate (TOPROL-XL) 100 MG 24 hr tablet Take 1 tablet (100 mg total) by mouth daily. Take with or immediately following a meal.  90 tablet  3  . peg 3350 powder (MOVIPREP) 100 G SOLR Moviprep-take as directed.  1 kit  0  . potassium chloride SA (K-DUR,KLOR-CON) 20 MEQ tablet Take 1 tablet (20 mEq total) by mouth daily.  90 tablet  3  . sildenafil (VIAGRA) 100 MG tablet Take 100 mg by  mouth daily as needed. As directed       . traMADol (ULTRAM) 50 MG tablet Take 1 tablet (50 mg total) by mouth every 6 (six) hours as needed for pain.  20 tablet  0    Physical Findings: The patient is in no acute distress. Patient is alert and oriented.  weight is 208 lb 4.8 oz (94.484 kg). His oral temperature is 97.9 F (36.6 C). His blood pressure is 125/69 and his pulse is 76. His respiration is 20. Marland Kitchen  No significant changes.  Impression:  The patient is recovering from the effects of radiation.    Plan:  Tommy Bauer at end of December.  He will continue to follow-up with urology for ongoing PSA determinations.  I will look forward to following his response through their correspondence, and be happy to participate in care if clinically indicated.  I talked to the patient about what to expect in the future, including his risk for erectile dysfunction and rectal bleeding.  I encouraged him to call or return to the office if he has any question about his previous radiation or possible radiation effects.  He was comfortable with this plan.  _____________________________________  Tommy Bauer. Kathrynn Running, M.D.

## 2012-11-21 ENCOUNTER — Encounter: Payer: Self-pay | Admitting: *Deleted

## 2012-12-28 DIAGNOSIS — C61 Malignant neoplasm of prostate: Secondary | ICD-10-CM | POA: Diagnosis not present

## 2013-01-16 ENCOUNTER — Ambulatory Visit (INDEPENDENT_AMBULATORY_CARE_PROVIDER_SITE_OTHER): Payer: Medicare Other | Admitting: Internal Medicine

## 2013-01-16 ENCOUNTER — Encounter: Payer: Self-pay | Admitting: Internal Medicine

## 2013-01-16 VITALS — BP 132/80 | HR 85 | Temp 98.0°F | Resp 18 | Wt 212.0 lb

## 2013-01-16 DIAGNOSIS — I10 Essential (primary) hypertension: Secondary | ICD-10-CM

## 2013-01-16 MED ORDER — CYCLOBENZAPRINE HCL 5 MG PO TABS: 5.0000 mg | ORAL_TABLET | Freq: Three times a day (TID) | ORAL | Status: DC | PRN

## 2013-01-16 MED ORDER — METHYLPREDNISOLONE ACETATE 80 MG/ML IJ SUSP
80.0000 mg | Freq: Once | INTRAMUSCULAR | Status: AC
  Administered 2013-01-16: 80 mg via INTRAMUSCULAR

## 2013-01-16 NOTE — Progress Notes (Signed)
Subjective:    Patient ID: Tommy Bauer, male    DOB: [birth year], 72 y.o.   MRN: [id number]  HPI 72 year old patient who has a history of low back pain. He's been on anti-inflammatory medications. This has improved more recently has had significant posterior neck pain this is aggravated by movement of the head to the left. No radicular symptoms. He has treated hypertension which has been well-controlled.   Past Medical History  Diagnosis Date  . BACK PAIN 07/16/2008  . Carpal tunnel syndrome 10/15/2008  . COLONIC POLYPS, HX OF 06/08/2007  . ERECTILE DYSFUNCTION, ORGANIC 07/11/2007  . Headache 07/11/2007  . HYPERTENSION 06/08/2007  . INGUINAL HERNIA 12/04/2007  . LEFT VENTRICULAR FUNCTION, DECREASED 11/12/2007  . PROSTATE SPECIFIC ANTIGEN, ELEVATED 05/06/2009  . TOBACCO ABUSE 11/06/2009  . ADENOCARCINOMA, PROSTATE 05/19/2010  . Prostate cancer 09/14/2009    prostateectomy,adenocarcinoma,gleason=4+3=7  . Contusion     left shoulder,left knee,s/p recent fall end of march 2013    History   Social History  . Marital Status: Married    Spouse Name: N/A    Number of Children: N/A  . Years of Education: N/A   Occupational History  .      retired Music therapist   Social History Main Topics  . Smoking status: Current Every Day Smoker -- 0.50 packs/day for 60 years    Types: Cigarettes  . Smokeless tobacco: Never Used  . Alcohol Use: Yes     Comment: occasionally  . Drug Use: No  . Sexually Active: Not on file   Other Topics Concern  . Not on file   Social History Narrative  . No narrative on file    Past Surgical History  Procedure Laterality Date  . Hernia repair      right ingunial  . Prostate surgery  09/14/2009    radical prostatectomy /gleason=4+3-7    Family History  Problem Relation Age of Onset  . Colon cancer Neg Hx     No Known Allergies  Current Outpatient Prescriptions on File Prior to Visit  Medication Sig Dispense Refill  . acetaminophen (TYLENOL) 500 MG tablet  Take 500 mg by mouth every 4 (four) hours as needed.      . benazepril (LOTENSIN) 40 MG tablet Take 1 tablet (40 mg total) by mouth daily.  90 tablet  6  . celecoxib (CELEBREX) 100 MG capsule Take 1 capsule (100 mg total) by mouth daily.  90 capsule  3  . chlorthalidone (HYGROTON) 25 MG tablet Take 1 tablet (25 mg total) by mouth daily.  90 tablet  6  . diclofenac sodium (VOLTAREN) 1 % GEL Apply topically 2 (two) times daily as needed. Shoulder and knee prn      . metoprolol succinate (TOPROL-XL) 100 MG 24 hr tablet Take 1 tablet (100 mg total) by mouth daily. Take with or immediately following a meal.  90 tablet  3  . potassium chloride SA (K-DUR,KLOR-CON) 20 MEQ tablet Take 1 tablet (20 mEq total) by mouth daily.  90 tablet  3  . sildenafil (VIAGRA) 100 MG tablet Take 100 mg by mouth daily as needed. As directed        No current facility-administered medications on file prior to visit.    BP 132/80  Pulse 85  Temp(Src) 98 F (36.7 C) (Oral)  Resp 18  Wt 212 lb (96.163 kg)  BMI 27.59 kg/m2  SpO2 98%         Review of Systems  Constitutional: Negative for fever, chills,  appetite change and fatigue.  HENT: Negative for hearing loss, ear pain, congestion, sore throat, trouble swallowing, neck stiffness, dental problem, voice change and tinnitus.   Eyes: Negative for pain, discharge and visual disturbance.  Respiratory: Negative for cough, chest tightness, wheezing and stridor.   Cardiovascular: Negative for chest pain, palpitations and leg swelling.  Gastrointestinal: Negative for nausea, vomiting, abdominal pain, diarrhea, constipation, blood in stool and abdominal distention.  Genitourinary: Negative for urgency, hematuria, flank pain, discharge, difficulty urinating and genital sores.  Musculoskeletal: Negative for myalgias, back pain, joint swelling, arthralgias and gait problem.       Neck pain  Skin: Negative for rash.  Neurological: Negative for dizziness, syncope, speech  difficulty, weakness, numbness and headaches.  Hematological: Negative for adenopathy. Does not bruise/bleed easily.  Psychiatric/Behavioral: Negative for behavioral problems and dysphoric mood. The patient is not nervous/anxious.        Objective:   Physical Exam  Constitutional: He is oriented to person, place, and time. He appears well-developed.  HENT:  Head: Normocephalic.  Right Ear: External ear normal.  Left Ear: External ear normal.  Eyes: Conjunctivae and EOM are normal.  Neck: Normal range of motion.  Slight tenderness over the posterior neck area. Pain is aggravated by turning to the left. Range of motion is slightly impaired  Cardiovascular: Normal rate and normal heart sounds.   Pulmonary/Chest: Breath sounds normal.  Abdominal: Bowel sounds are normal.  Musculoskeletal: Normal range of motion. He exhibits no edema and no tenderness.  Neurological: He is alert and oriented to person, place, and time.  Psychiatric: He has a normal mood and affect. His behavior is normal.          Assessment & Plan:  Neck pain. Probable cervical disc disease. Will treat with Depo-Medrol heat rest as well as a low-dose muscle relaxer. Consider a cervical collar if unimproved  Hypertension stable

## 2013-01-16 NOTE — Patient Instructions (Signed)
You  may move around, but avoid painful motions and activities.  Apply heat  to the sore area for 15 to 20 minutes 3 or 4 times daily for the next two to 3 days.Cervical Sprain A cervical sprain is an injury in the neck in which the ligaments are stretched or torn. The ligaments are the tissues that hold the bones of the neck (vertebrae) in place.Cervical sprains can range from very mild to very severe. Most cervical sprains get better in 1 to 3 weeks, but it depends on the cause and extent of the injury. Severe cervical sprains can cause the neck vertebrae to be unstable. This can lead to damage of the spinal cord and can result in serious nervous system problems. Your caregiver will determine whether your cervical sprain is mild or severe. CAUSES  Severe cervical sprains may be caused by:  Contact sport injuries (football, rugby, wrestling, hockey, auto racing, gymnastics, diving, martial arts, boxing).  Motor vehicle collisions.  Whiplash injuries. This means the neck is forcefully whipped backward and forward.  Falls. Mild cervical sprains may be caused by:   Awkward positions, such as cradling a telephone between your ear and shoulder.  Sitting in a chair that does not offer proper support.  Working at a poorly Marketing executive station.  Activities that require looking up or down for long periods of time. SYMPTOMS   Pain, soreness, stiffness, or a burning sensation in the front, back, or sides of the neck. This discomfort may develop immediately after injury or it may develop slowly and not begin for 24 hours or more after an injury.  Pain or tenderness directly in the middle of the back of the neck.  Shoulder or upper back pain.  Limited ability to move the neck.  Headache.  Dizziness.  Weakness, numbness, or tingling in the hands or arms.  Muscle spasms.  Difficulty swallowing or chewing.  Tenderness and swelling of the neck. DIAGNOSIS  Most of the time, your  caregiver can diagnose this problem by taking your history and doing a physical exam. Your caregiver will ask about any known problems, such as arthritis in the neck or a previous neck injury. X-rays may be taken to find out if there are any other problems, such as problems with the bones of the neck. However, an X-ray often does not reveal the full extent of a cervical sprain. Other tests such as a computed tomography (CT) scan or magnetic resonance imaging (MRI) may be needed. TREATMENT  Treatment depends on the severity of the cervical sprain. Mild sprains can be treated with rest, keeping the neck in place (immobilization), and pain medicines. Severe cervical sprains need immediate immobilization and an appointment with an orthopedist or neurosurgeon. Several treatment options are available to help with pain, muscle spasms, and other symptoms. Your caregiver may prescribe:  Medicines, such as pain relievers, numbing medicines, or muscle relaxants.  Physical therapy. This can include stretching exercises, strengthening exercises, and posture training. Exercises and improved posture can help stabilize the neck, strengthen muscles, and help stop symptoms from returning.  A neck collar to be worn for short periods of time. Often, these collars are worn for comfort. However, certain collars may be worn to protect the neck and prevent further worsening of a serious cervical sprain. HOME CARE INSTRUCTIONS   Put ice on the injured area.  Put ice in a plastic bag.  Place a towel between your skin and the bag.  Leave the ice on for 15  to 20 minutes, 3 to 4 times a day.  Only take over-the-counter or prescription medicines for pain, discomfort, or fever as directed by your caregiver.  Keep all follow-up appointments as directed by your caregiver.  Keep all physical therapy appointments as directed by your caregiver.  If a neck collar is prescribed, wear it as directed by your caregiver.  Do not  drive while wearing a neck collar.  Make any needed adjustments to your work station to promote good posture.  Avoid positions and activities that make your symptoms worse.  Warm up and stretch before being active to help prevent problems. SEEK MEDICAL CARE IF:   Your pain is not controlled with medicine.  You are unable to decrease your pain medicine over time as planned.  Your activity level is not improving as expected. SEEK IMMEDIATE MEDICAL CARE IF:   You develop any bleeding, stomach upset, or signs of an allergic reaction to your medicine.  Your symptoms get worse.  You develop new, unexplained symptoms.  You have numbness, tingling, weakness, or paralysis in any part of your body. MAKE SURE YOU:   Understand these instructions.  Will watch your condition.  Will get help right away if you are not doing well or get worse. Document Released: 08/14/2007 Document Revised: 01/09/2012 Document Reviewed: 07/20/2011 [facility] Patient Information 2013 [place], [place].

## 2013-02-07 ENCOUNTER — Other Ambulatory Visit: Payer: Self-pay | Admitting: *Deleted

## 2013-02-07 MED ORDER — METOPROLOL SUCCINATE ER 100 MG PO TB24: 100.0000 mg | ORAL_TABLET | Freq: Every day | ORAL | Status: DC

## 2013-03-12 ENCOUNTER — Encounter: Payer: Self-pay | Admitting: Internal Medicine

## 2013-03-12 ENCOUNTER — Ambulatory Visit (INDEPENDENT_AMBULATORY_CARE_PROVIDER_SITE_OTHER): Payer: Medicare Other | Admitting: Internal Medicine

## 2013-03-12 VITALS — BP 140/80 | HR 78 | Temp 98.3°F | Resp 20 | Wt 212.0 lb

## 2013-03-12 DIAGNOSIS — I10 Essential (primary) hypertension: Secondary | ICD-10-CM

## 2013-03-12 DIAGNOSIS — F17200 Nicotine dependence, unspecified, uncomplicated: Secondary | ICD-10-CM | POA: Diagnosis not present

## 2013-03-12 DIAGNOSIS — I2510 Atherosclerotic heart disease of native coronary artery without angina pectoris: Secondary | ICD-10-CM | POA: Diagnosis not present

## 2013-03-12 DIAGNOSIS — N529 Male erectile dysfunction, unspecified: Secondary | ICD-10-CM | POA: Diagnosis not present

## 2013-03-12 NOTE — Progress Notes (Signed)
Subjective:    Patient ID: Tommy Bauer, male    DOB: 1941-03-05, 72 y.o.   MRN: [id number]  HPI  72 year old patient who is seen today for followup. He has a history of hypertension as well as left ventricular dysfunction. He has done quite well and denies any cardiopulmonary complaints. His only complaint today is neck pain is been present for 2 days. This was a chief complaint 2 months ago but has done well until recently. He has a history of tobacco use but has cut back on his tobacco consumption. One pack now last about 3 days. He has a history of ADD and is requesting samples of medication.  Past Medical History  Diagnosis Date  . BACK PAIN 07/16/2008  . Carpal tunnel syndrome 10/15/2008  . COLONIC POLYPS, HX OF 06/08/2007  . ERECTILE DYSFUNCTION, ORGANIC 07/11/2007  . Headache 07/11/2007  . HYPERTENSION 06/08/2007  . INGUINAL HERNIA 12/04/2007  . LEFT VENTRICULAR FUNCTION, DECREASED 11/12/2007  . PROSTATE SPECIFIC ANTIGEN, ELEVATED 05/06/2009  . TOBACCO ABUSE 11/06/2009  . ADENOCARCINOMA, PROSTATE 05/19/2010  . Prostate cancer 09/14/2009    prostateectomy,adenocarcinoma,gleason=4+3=7  . Contusion     left shoulder,left knee,s/p recent fall end of march 2013    History   Social History  . Marital Status: Married    Spouse Name: N/A    Number of Children: N/A  . Years of Education: N/A   Occupational History  .      retired Music therapist   Social History Main Topics  . Smoking status: Current Every Day Smoker -- 0.50 packs/day for 60 years    Types: Cigarettes  . Smokeless tobacco: Never Used  . Alcohol Use: Yes     Comment: occasionally  . Drug Use: No  . Sexually Active: Not on file   Other Topics Concern  . Not on file   Social History Narrative  . No narrative on file    Past Surgical History  Procedure Laterality Date  . Hernia repair      right ingunial  . Prostate surgery  09/14/2009    radical prostatectomy /gleason=4+3-7    Family History  Problem Relation  Age of Onset  . Colon cancer Neg Hx     No Known Allergies  Current Outpatient Prescriptions on File Prior to Visit  Medication Sig Dispense Refill  . acetaminophen (TYLENOL) 500 MG tablet Take 500 mg by mouth every 4 (four) hours as needed.      . benazepril (LOTENSIN) 40 MG tablet Take 1 tablet (40 mg total) by mouth daily.  90 tablet  6  . celecoxib (CELEBREX) 100 MG capsule Take 1 capsule (100 mg total) by mouth daily.  90 capsule  3  . chlorthalidone (HYGROTON) 25 MG tablet Take 1 tablet (25 mg total) by mouth daily.  90 tablet  6  . cyclobenzaprine (FLEXERIL) 5 MG tablet Take 1 tablet (5 mg total) by mouth 3 (three) times daily as needed for muscle spasms.  30 tablet  1  . diclofenac sodium (VOLTAREN) 1 % GEL Apply topically 2 (two) times daily as needed. Shoulder and knee prn      . metoprolol succinate (TOPROL-XL) 100 MG 24 hr tablet Take 1 tablet (100 mg total) by mouth daily. Take with or immediately following a meal.  90 tablet  1  . potassium chloride SA (K-DUR,KLOR-CON) 20 MEQ tablet Take 1 tablet (20 mEq total) by mouth daily.  90 tablet  3  . sildenafil (VIAGRA) 100 MG tablet Take 100  mg by mouth daily as needed. As directed        No current facility-administered medications on file prior to visit.    BP 140/80  Pulse 78  Temp(Src) 98.3 F (36.8 C) (Oral)  Resp 20  Wt 212 lb (96.163 kg)  BMI 27.59 kg/m2  SpO2 97%       Review of Systems  Constitutional: Negative for fever, chills, appetite change and fatigue.  HENT: Negative for hearing loss, ear pain, congestion, sore throat, trouble swallowing, neck stiffness, dental problem, voice change and tinnitus.   Eyes: Negative for pain, discharge and visual disturbance.  Respiratory: Negative for cough, chest tightness, wheezing and stridor.   Cardiovascular: Negative for chest pain, palpitations and leg swelling.  Gastrointestinal: Negative for nausea, vomiting, abdominal pain, diarrhea, constipation, blood in  stool and abdominal distention.  Genitourinary: Negative for urgency, hematuria, flank pain, discharge, difficulty urinating and genital sores.  Musculoskeletal: Negative for myalgias, back pain, joint swelling, arthralgias and gait problem.       Posterior neck pain  Skin: Negative for rash.  Neurological: Negative for dizziness, syncope, speech difficulty, weakness, numbness and headaches.  Hematological: Negative for adenopathy. Does not bruise/bleed easily.  Psychiatric/Behavioral: Negative for behavioral problems and dysphoric mood. The patient is not nervous/anxious.        Objective:   Physical Exam  Constitutional: He is oriented to person, place, and time. He appears well-developed.  HENT:  Head: Normocephalic.  Right Ear: External ear normal.  Left Ear: External ear normal.  Eyes: Conjunctivae and EOM are normal.  Neck: Normal range of motion.  Cardiovascular: Normal rate and normal heart sounds.   Pulmonary/Chest: Breath sounds normal.  Abdominal: Bowel sounds are normal.  Musculoskeletal: Normal range of motion. He exhibits no edema and no tenderness.  Neurological: He is alert and oriented to person, place, and time.  Psychiatric: He has a normal mood and affect. His behavior is normal.          Assessment & Plan:   Hypertension well controlled Left ventricular dysfunction. Stable Tobacco abuse. Total smoking cessation encouraged ED. Samples of Cialis dispensed  No change medications All medicines refilled CPX 6 months

## 2013-03-12 NOTE — Patient Instructions (Signed)
Limit your sodium (Salt) intake    It is important that you exercise regularly, at least 20 minutes 3 to 4 times per week.  If you develop chest pain or shortness of breath seek  medical attention.  Return in 6 months for follow-up  Smoking tobacco is very bad for your health. You should stop smoking immediately. 

## 2013-04-12 ENCOUNTER — Ambulatory Visit (INDEPENDENT_AMBULATORY_CARE_PROVIDER_SITE_OTHER): Payer: Medicare Other | Admitting: Internal Medicine

## 2013-04-12 ENCOUNTER — Encounter: Payer: Self-pay | Admitting: Internal Medicine

## 2013-04-12 VITALS — BP 140/80 | HR 72 | Temp 98.2°F | Resp 20 | Wt 214.0 lb

## 2013-04-12 DIAGNOSIS — F17200 Nicotine dependence, unspecified, uncomplicated: Secondary | ICD-10-CM

## 2013-04-12 DIAGNOSIS — R51 Headache: Secondary | ICD-10-CM | POA: Diagnosis not present

## 2013-04-12 DIAGNOSIS — I10 Essential (primary) hypertension: Secondary | ICD-10-CM

## 2013-04-12 MED ORDER — BENAZEPRIL HCL 40 MG PO TABS: 40.0000 mg | ORAL_TABLET | Freq: Every day | ORAL | Status: DC

## 2013-04-12 MED ORDER — CYCLOBENZAPRINE HCL 5 MG PO TABS: 5.0000 mg | ORAL_TABLET | Freq: Three times a day (TID) | ORAL | Status: DC | PRN

## 2013-04-12 NOTE — Progress Notes (Signed)
Subjective:    Patient ID: Tommy Bauer, male    DOB: [birth year], 72 y.o.   MRN: [id number]  HPI  72 year old patient who has treated hypertension. He states that he has been out of Lotensin for some time. Apparently this was not refilled. Today he presents with a two-week history of right posterior neck pain. Pain is aggravated by head movement especially to the right. He has a history of ongoing tobacco use. Otherwise done quite well.  BP Readings from Last 3 Encounters:  04/12/13 140/80  03/12/13 140/80  01/16/13 132/80    Past Medical History  Diagnosis Date  . BACK PAIN 07/16/2008  . Carpal tunnel syndrome 10/15/2008  . COLONIC POLYPS, HX OF 06/08/2007  . ERECTILE DYSFUNCTION, ORGANIC 07/11/2007  . Headache(784.0) 07/11/2007  . HYPERTENSION 06/08/2007  . INGUINAL HERNIA 12/04/2007  . LEFT VENTRICULAR FUNCTION, DECREASED 11/12/2007  . PROSTATE SPECIFIC ANTIGEN, ELEVATED 05/06/2009  . TOBACCO ABUSE 11/06/2009  . ADENOCARCINOMA, PROSTATE 05/19/2010  . Prostate cancer 09/14/2009    prostateectomy,adenocarcinoma,gleason=4+3=7  . Contusion     left shoulder,left knee,s/p recent fall end of march 2013    History   Social History  . Marital Status: Married    Spouse Name: N/A    Number of Children: N/A  . Years of Education: N/A   Occupational History  .      retired Music therapist   Social History Main Topics  . Smoking status: Current Every Day Smoker -- 0.50 packs/day for 60 years    Types: Cigarettes  . Smokeless tobacco: Never Used  . Alcohol Use: Yes     Comment: occasionally  . Drug Use: No  . Sexually Active: Not on file   Other Topics Concern  . Not on file   Social History Narrative  . No narrative on file    Past Surgical History  Procedure Laterality Date  . Hernia repair      right ingunial  . Prostate surgery  09/14/2009    radical prostatectomy /gleason=4+3-7    Family History  Problem Relation Age of Onset  . Colon cancer Neg Hx     No Known  Allergies  Current Outpatient Prescriptions on File Prior to Visit  Medication Sig Dispense Refill  . acetaminophen (TYLENOL) 500 MG tablet Take 500 mg by mouth every 4 (four) hours as needed.      . chlorthalidone (HYGROTON) 25 MG tablet Take 1 tablet (25 mg total) by mouth daily.  90 tablet  6  . diclofenac sodium (VOLTAREN) 1 % GEL Apply topically 2 (two) times daily as needed. Shoulder and knee prn      . metoprolol succinate (TOPROL-XL) 100 MG 24 hr tablet Take 1 tablet (100 mg total) by mouth daily. Take with or immediately following a meal.  90 tablet  1  . potassium chloride SA (K-DUR,KLOR-CON) 20 MEQ tablet Take 1 tablet (20 mEq total) by mouth daily.  90 tablet  3  . sildenafil (VIAGRA) 100 MG tablet Take 100 mg by mouth daily as needed. As directed       . celecoxib (CELEBREX) 100 MG capsule Take 1 capsule (100 mg total) by mouth daily.  90 capsule  3   No current facility-administered medications on file prior to visit.    BP 140/80  Pulse 72  Temp(Src) 98.2 F (36.8 C) (Oral)  Resp 20  Wt 214 lb (97.07 kg)  BMI 27.85 kg/m2  SpO2 98%       Review of Systems  Constitutional:  Negative for fever, chills, appetite change and fatigue.  HENT: Positive for neck pain. Negative for hearing loss, ear pain, congestion, sore throat, trouble swallowing, neck stiffness, dental problem, voice change and tinnitus.   Eyes: Negative for pain, discharge and visual disturbance.  Respiratory: Negative for cough, chest tightness, wheezing and stridor.   Cardiovascular: Negative for chest pain, palpitations and leg swelling.  Gastrointestinal: Negative for nausea, vomiting, abdominal pain, diarrhea, constipation, blood in stool and abdominal distention.  Genitourinary: Negative for urgency, hematuria, flank pain, discharge, difficulty urinating and genital sores.  Musculoskeletal: Negative for myalgias, back pain, joint swelling, arthralgias and gait problem.  Skin: Negative for rash.   Neurological: Negative for dizziness, syncope, speech difficulty, weakness, numbness and headaches.  Hematological: Negative for adenopathy. Does not bruise/bleed easily.  Psychiatric/Behavioral: Negative for behavioral problems and dysphoric mood. The patient is not nervous/anxious.        Objective:   Physical Exam  Constitutional: He is oriented to person, place, and time. He appears well-developed.  Blood pressure 130/80  HENT:  Head: Normocephalic.  Right Ear: External ear normal.  Left Ear: External ear normal.  Eyes: Conjunctivae and EOM are normal.  Neck: Normal range of motion.  Slight tenderness involving the right posterior neck musculature  Cardiovascular: Normal rate and normal heart sounds.   Pulmonary/Chest: Breath sounds normal.  Abdominal: Bowel sounds are normal.  Musculoskeletal: Normal range of motion. He exhibits no edema and no tenderness.  Neurological: He is alert and oriented to person, place, and time.  Psychiatric: He has a normal mood and affect. His behavior is normal.          Assessment & Plan:   Hypertension. Appears to be well controlled. Will resume Lotensin in view of his history of left ventricular dysfunction Neck pain. Suspect muscle ligamentous. Will try gentle stretching and range of motion when compresses. Will refill Flexeril if needed

## 2013-04-12 NOTE — Patient Instructions (Signed)
Limit your sodium (Salt) intake  You  may move around, but avoid painful motions and activities.  Apply heat  to the sore area for 15 to 20 minutes 3 or 4 times daily for the next two to 3 days.  Continue Celebrex and Flexeril  Return in 6 months for follow-up

## 2013-06-26 DIAGNOSIS — C61 Malignant neoplasm of prostate: Secondary | ICD-10-CM | POA: Diagnosis not present

## 2013-07-03 DIAGNOSIS — R351 Nocturia: Secondary | ICD-10-CM | POA: Diagnosis not present

## 2013-07-03 DIAGNOSIS — C61 Malignant neoplasm of prostate: Secondary | ICD-10-CM | POA: Diagnosis not present

## 2013-07-25 ENCOUNTER — Ambulatory Visit (INDEPENDENT_AMBULATORY_CARE_PROVIDER_SITE_OTHER): Payer: Medicare Other | Admitting: Internal Medicine

## 2013-07-25 ENCOUNTER — Encounter: Payer: Self-pay | Admitting: Internal Medicine

## 2013-07-25 VITALS — BP 120/80 | HR 82 | Temp 98.3°F | Resp 20 | Wt 209.0 lb

## 2013-07-25 DIAGNOSIS — F17200 Nicotine dependence, unspecified, uncomplicated: Secondary | ICD-10-CM

## 2013-07-25 DIAGNOSIS — I10 Essential (primary) hypertension: Secondary | ICD-10-CM

## 2013-07-25 DIAGNOSIS — Z23 Encounter for immunization: Secondary | ICD-10-CM

## 2013-07-25 MED ORDER — CYCLOBENZAPRINE HCL 5 MG PO TABS: 5.0000 mg | ORAL_TABLET | Freq: Three times a day (TID) | ORAL | Status: DC | PRN

## 2013-07-25 MED ORDER — POTASSIUM CHLORIDE CRYS ER 20 MEQ PO TBCR: 20.0000 meq | EXTENDED_RELEASE_TABLET | Freq: Every day | ORAL | Status: DC

## 2013-07-25 MED ORDER — TRAMADOL HCL 50 MG PO TABS: 50.0000 mg | ORAL_TABLET | Freq: Four times a day (QID) | ORAL | Status: DC | PRN

## 2013-07-25 MED ORDER — BENAZEPRIL HCL 40 MG PO TABS: 40.0000 mg | ORAL_TABLET | Freq: Every day | ORAL | Status: DC

## 2013-07-25 MED ORDER — DICLOFENAC SODIUM 1 % TD GEL: 2.0000 g | Freq: Two times a day (BID) | TRANSDERMAL | Status: DC | PRN

## 2013-07-25 MED ORDER — SILDENAFIL CITRATE 100 MG PO TABS: 100.0000 mg | ORAL_TABLET | Freq: Every day | ORAL | Status: DC | PRN

## 2013-07-25 MED ORDER — CHLORTHALIDONE 25 MG PO TABS: 25.0000 mg | ORAL_TABLET | Freq: Every day | ORAL | Status: DC

## 2013-07-25 MED ORDER — METOPROLOL SUCCINATE ER 100 MG PO TB24: 100.0000 mg | ORAL_TABLET | Freq: Every day | ORAL | Status: DC

## 2013-07-25 MED ORDER — CELECOXIB 100 MG PO CAPS: 100.0000 mg | ORAL_CAPSULE | Freq: Every day | ORAL | Status: DC

## 2013-07-25 NOTE — Patient Instructions (Addendum)
Limit your sodium (Salt) intake  Please check your blood pressure on a regular basis.  If it is consistently greater than 150/90, please make an office appointment.  Return in 6 months for follow-up  Smoking tobacco is very bad for your health. You should stop smoking immediately. 

## 2013-07-25 NOTE — Progress Notes (Signed)
Subjective:    Patient ID: Tommy Bauer, male    DOB: [birth year], 72 y.o.   MRN: [id number]  HPI  72 year old patient who is seen today for followup. He has hypertension and ongoing tobacco use. He has chronic back pain and this is one of his chief complaints today as well as right knee pain. He has been using Celebrex which has been beneficial. He Past Medical History  Diagnosis Date  . BACK PAIN 07/16/2008  . Carpal tunnel syndrome 10/15/2008  . COLONIC POLYPS, HX OF 06/08/2007  . ERECTILE DYSFUNCTION, ORGANIC 07/11/2007  . Headache(784.0) 07/11/2007  . HYPERTENSION 06/08/2007  . INGUINAL HERNIA 12/04/2007  . LEFT VENTRICULAR FUNCTION, DECREASED 11/12/2007  . PROSTATE SPECIFIC ANTIGEN, ELEVATED 05/06/2009  . TOBACCO ABUSE 11/06/2009  . ADENOCARCINOMA, PROSTATE 05/19/2010  . Prostate cancer 09/14/2009    prostateectomy,adenocarcinoma,gleason=4+3=7  . Contusion     left shoulder,left knee,s/p recent fall end of march 2013    History   Social History  . Marital Status: Married    Spouse Name: N/A    Number of Children: N/A  . Years of Education: N/A   Occupational History  .      retired Music therapist   Social History Main Topics  . Smoking status: Current Every Day Smoker -- 0.50 packs/day for 60 years    Types: Cigarettes  . Smokeless tobacco: Never Used  . Alcohol Use: Yes     Comment: occasionally  . Drug Use: No  . Sexual Activity: Not on file   Other Topics Concern  . Not on file   Social History Narrative  . No narrative on file    Past Surgical History  Procedure Laterality Date  . Hernia repair      right ingunial  . Prostate surgery  09/14/2009    radical prostatectomy /gleason=4+3-7    Family History  Problem Relation Age of Onset  . Colon cancer Neg Hx     No Known Allergies  Current Outpatient Prescriptions on File Prior to Visit  Medication Sig Dispense Refill  . acetaminophen (TYLENOL) 500 MG tablet Take 500 mg by mouth every 4 (four) hours as needed.        No current facility-administered medications on file prior to visit.    BP 120/80  Pulse 82  Temp(Src) 98.3 F (36.8 C) (Oral)  Resp 20  Wt 209 lb (94.802 kg)  BMI 27.2 kg/m2  SpO2 98%        Review of Systems  Constitutional: Negative for fever, chills, appetite change and fatigue.  HENT: Negative for hearing loss, ear pain, congestion, sore throat, trouble swallowing, neck stiffness, dental problem, voice change and tinnitus.   Eyes: Negative for pain, discharge and visual disturbance.  Respiratory: Negative for cough, chest tightness, wheezing and stridor.   Cardiovascular: Negative for chest pain, palpitations and leg swelling.  Gastrointestinal: Negative for nausea, vomiting, abdominal pain, diarrhea, constipation, blood in stool and abdominal distention.  Genitourinary: Negative for urgency, hematuria, flank pain, discharge, difficulty urinating and genital sores.  Musculoskeletal: Positive for back pain and arthralgias. Negative for myalgias, joint swelling and gait problem.       Right knee pain  Skin: Negative for rash.  Neurological: Negative for dizziness, syncope, speech difficulty, weakness, numbness and headaches.  Hematological: Negative for adenopathy. Does not bruise/bleed easily.  Psychiatric/Behavioral: Negative for behavioral problems and dysphoric mood. The patient is not nervous/anxious.        Objective:   Physical Exam  Constitutional: He is oriented  to person, place, and time. He appears well-developed.  HENT:  Head: Normocephalic.  Right Ear: External ear normal.  Left Ear: External ear normal.  Eyes: Conjunctivae and EOM are normal.  Neck: Normal range of motion.  Cardiovascular: Normal rate and normal heart sounds.   Pulmonary/Chest: Breath sounds normal.  Abdominal: Bowel sounds are normal.  Musculoskeletal: Normal range of motion. He exhibits no edema and no tenderness.  Right knee examined. No effusion or excessive warmth   Neurological: He is alert and oriented to person, place, and time.  Psychiatric: He has a normal mood and affect. His behavior is normal.          Assessment & Plan:  Hypertension. Well controlled Low back pain Right knee pain  The patient will resume Celebrex. A prescription for tramadol dispensed. If improved will consider orthopedic referral Cessation of tobacco products encouraged  CPX 6 months

## 2013-09-13 ENCOUNTER — Encounter: Payer: Self-pay | Admitting: *Deleted

## 2013-09-16 ENCOUNTER — Encounter: Payer: Self-pay | Admitting: Internal Medicine

## 2013-09-16 ENCOUNTER — Ambulatory Visit (INDEPENDENT_AMBULATORY_CARE_PROVIDER_SITE_OTHER): Payer: Medicare Other | Admitting: Internal Medicine

## 2013-09-16 VITALS — BP 150/80 | HR 80 | Temp 98.3°F | Resp 20 | Ht 72.0 in | Wt 213.0 lb

## 2013-09-16 DIAGNOSIS — I10 Essential (primary) hypertension: Secondary | ICD-10-CM | POA: Diagnosis not present

## 2013-09-16 DIAGNOSIS — C61 Malignant neoplasm of prostate: Secondary | ICD-10-CM

## 2013-09-16 DIAGNOSIS — Z860100 Personal history of colon polyps, unspecified: Secondary | ICD-10-CM

## 2013-09-16 DIAGNOSIS — Z0000 Encounter for general adult medical examination without abnormal findings: Secondary | ICD-10-CM

## 2013-09-16 DIAGNOSIS — I2510 Atherosclerotic heart disease of native coronary artery without angina pectoris: Secondary | ICD-10-CM

## 2013-09-16 DIAGNOSIS — Z79899 Other long term (current) drug therapy: Secondary | ICD-10-CM | POA: Diagnosis not present

## 2013-09-16 DIAGNOSIS — F17200 Nicotine dependence, unspecified, uncomplicated: Secondary | ICD-10-CM | POA: Diagnosis not present

## 2013-09-16 LAB — COMPREHENSIVE METABOLIC PANEL
Albumin: 4.1 g/dL (ref 3.5–5.2)
CO2: 29 mEq/L (ref 19–32)
Calcium: 9.1 mg/dL (ref 8.4–10.5)
Chloride: 101 mEq/L (ref 96–112)
GFR: 96.52 mL/min (ref >60.00)
Glucose, Bld: 76 mg/dL (ref 70–99)
Potassium: 3.9 mEq/L (ref 3.5–5.1)
Sodium: 137 mEq/L (ref 135–145)
Total Protein: 7.4 g/dL (ref 6.0–8.3)

## 2013-09-16 LAB — CBC WITH DIFFERENTIAL/PLATELET
Eosinophils Relative: 4.3 % (ref 0.0–5.0)
Lymphocytes Relative: 22.6 % (ref 12.0–46.0)
Monocytes Absolute: 0.4 10*3/uL (ref 0.1–1.0)
Monocytes Relative: 10.3 % (ref 3.0–12.0)
Neutro Abs: 2.6 10*3/uL (ref 1.4–7.7)
Neutrophils Relative %: 62.3 % (ref 43.0–77.0)
Platelets: 223 10*3/uL (ref 150.0–400.0)
WBC: 4.2 10*3/uL — ABNORMAL LOW (ref 4.5–10.5)

## 2013-09-16 LAB — LIPID PANEL
HDL: 47.6 mg/dL (ref >39.00)
Total CHOL/HDL Ratio: 4

## 2013-09-16 LAB — TSH: TSH: 0.85 u[IU]/mL (ref 0.35–5.50)

## 2013-09-16 MED ORDER — MELOXICAM 15 MG PO TABS: 15.0000 mg | ORAL_TABLET | Freq: Every day | ORAL | Status: DC

## 2013-09-16 NOTE — Patient Instructions (Signed)
Limit your sodium (Salt) intake  Smoking tobacco is very bad for your health. You should stop smoking immediately.  Followup urology    It is important that you exercise regularly, at least 20 minutes 3 to 4 times per week.  If you develop chest pain or shortness of breath seek  medical attention.  Return in 6 months for follow-up

## 2013-09-16 NOTE — Progress Notes (Signed)
Pre-visit discussion using our clinic review tool. No additional management support is needed unless otherwise documented below in the visit note.  

## 2013-09-16 NOTE — Progress Notes (Signed)
Patient ID: Tommy Bauer, male   DOB: [birth year], 72 y.o.   MRN: [id number]  Subjective:    Patient ID: Tommy Bauer, male    DOB: 03/15/1941, 72 y.o.   MRN: [id number]  HPI Pre-visit discussion using our clinic review tool. No additional management support is needed unless otherwise documented below in the visit note.    CC: cpx- doing well.   History of Present Illness:   72  year-old patient who is seen today for a comprehensive evaluation. Medical problems include a history of LV dysfunction. He has hypertension, colonic polyps, and a history of ongoing tobacco use. . denies any symptoms of congestive heart failure. No exertional chest pain. He has decreased his tobacco use, to just a few cigarettes daily. Total cessation discussed and encouraged.  He has  completed radiation treatment for extra capsular extension of prostate cancer one year ago and is being followed by urology.   Here for Medicare AWV:   1. Risk factors based on Past M, S, F history: patient has a history of LV dysfunction, hypertension, and tobacco use  2. Physical Activities: no limitations  3. Depression/mood: no history of depression or mood disorder  4. Hearing: no hearing deficits  5. ADL's: remains independent in all aspects of daily living  6. Fall Risk: low  7. Home Safety: no problems identified  8. Height, weight, &visual acuity:height and weight are stable. No difficulty with visual acuity  9. Counseling: total cessation of all tobacco products. Encouraged  10. Labs ordered based on risk factors: laboratory studies, including PSA will be reviewed  11. Referral Coordination-followed urology. This fall  12. Care Plan- heart healthy, salt restricted diet more regular exercise and modest weight loss. All encouraged  13. Cognitive Assessment- alert and oriented, with normal affect   Preventive Screening-Counseling & Management  Alcohol-Tobacco  Smoking Status: current  Smoking Cessation Counseling: yes    Allergies (verified):  No Known Drug Allergies   Past History:  Past Medical History:    Colonic polyps, hx of ; colonoscopy may 2013 Hypertension  ED  LVD  R inguinal hernia-repaired  prostate cancer status post prostatectomy and radiotherapy  Past Surgical History:   Colonoscopy October2007 May 2013 Cardiolite stress test September 2007  status post radical prostatectomy November 2010 (T1C NO MO)   Family History:   details of his father's health unknown.  Mother died age 80, cancer, unclear type  III sisters, one deceased, age 19 cancer, unclear type;  another died Ca-leukemia  one brother died-trauma and secondary infection   Social History:   Current Smoker  Married  one son  still works doing Multimedia programmer      Review of Systems  Constitutional: Negative for fever, chills, activity change, appetite change and fatigue.  HENT: Negative for congestion, dental problem, ear pain, hearing loss, mouth sores, rhinorrhea, sinus pressure, sneezing, tinnitus, trouble swallowing and voice change.   Eyes: Negative for photophobia, pain, redness and visual disturbance.  Respiratory: Negative for apnea, cough, choking, chest tightness, shortness of breath and wheezing.   Cardiovascular: Negative for chest pain, palpitations and leg swelling.  Gastrointestinal: Negative for nausea, vomiting, abdominal pain, diarrhea, constipation, blood in stool, abdominal distention, anal bleeding and rectal pain.  Genitourinary: Negative for dysuria, urgency, frequency, hematuria, flank pain, decreased urine volume, discharge, penile swelling, scrotal swelling, difficulty urinating, genital sores and testicular pain.  Musculoskeletal: Negative for arthralgias, back pain, gait problem, joint swelling, myalgias, neck pain and neck stiffness.  Skin: Negative for  color change, rash and wound.  Neurological: Negative for dizziness, tremors, seizures, syncope, facial asymmetry, speech  difficulty, weakness, light-headedness, numbness and headaches.  Hematological: Negative for adenopathy. Does not bruise/bleed easily.  Psychiatric/Behavioral: Negative for suicidal ideas, hallucinations, behavioral problems, confusion, sleep disturbance, self-injury, dysphoric mood, decreased concentration and agitation. The patient is not nervous/anxious.        Objective:   Physical Exam  Constitutional: He appears well-developed and well-nourished. No distress.  HENT:  Head: Normocephalic and atraumatic.  Right Ear: External ear normal.  Left Ear: External ear normal.  Nose: Nose normal.  Mouth/Throat: Oropharynx is clear and moist.  Eyes: Conjunctivae and EOM are normal. Pupils are equal, round, and reactive to light. No scleral icterus.  Neck: Normal range of motion. Neck supple. No JVD present. No thyromegaly present.  Cardiovascular: Regular rhythm, normal heart sounds and intact distal pulses.  Exam reveals no gallop and no friction rub.   No murmur heard. Pedal pulses faint  Pulmonary/Chest: Effort normal and breath sounds normal. He exhibits no tenderness.  Abdominal: Soft. Bowel sounds are normal. He exhibits no distension and no mass. There is no tenderness.  Genitourinary: Penis normal. Guaiac negative stool.  Musculoskeletal: Normal range of motion. He exhibits no edema and no tenderness.  Lymphadenopathy:    He has no cervical adenopathy.  Neurological: He is alert. He has normal reflexes. No cranial nerve deficit. Coordination normal.  Skin: Skin is warm and dry. No rash noted.  Psychiatric: He has a normal mood and affect. His behavior is normal.          Assessment & Plan:    Preventive health Hypertension stable Left ventricular dysfunction. Asymptomatic History colonic polyps.  History of prostate cancer.  Followup urology in 6 months

## 2013-10-04 ENCOUNTER — Encounter: Payer: Self-pay | Admitting: Internal Medicine

## 2014-01-15 DIAGNOSIS — C61 Malignant neoplasm of prostate: Secondary | ICD-10-CM | POA: Diagnosis not present

## 2014-01-22 DIAGNOSIS — C61 Malignant neoplasm of prostate: Secondary | ICD-10-CM | POA: Diagnosis not present

## 2014-01-22 DIAGNOSIS — R351 Nocturia: Secondary | ICD-10-CM | POA: Diagnosis not present

## 2014-01-22 DIAGNOSIS — N529 Male erectile dysfunction, unspecified: Secondary | ICD-10-CM | POA: Diagnosis not present

## 2014-02-24 ENCOUNTER — Ambulatory Visit (INDEPENDENT_AMBULATORY_CARE_PROVIDER_SITE_OTHER): Payer: Medicare Other | Admitting: Internal Medicine

## 2014-02-24 ENCOUNTER — Encounter: Payer: Self-pay | Admitting: Internal Medicine

## 2014-02-24 VITALS — BP 160/90 | HR 83 | Temp 98.8°F | Resp 20 | Ht 72.0 in | Wt 218.0 lb

## 2014-02-24 DIAGNOSIS — F17200 Nicotine dependence, unspecified, uncomplicated: Secondary | ICD-10-CM | POA: Diagnosis not present

## 2014-02-24 DIAGNOSIS — I10 Essential (primary) hypertension: Secondary | ICD-10-CM | POA: Diagnosis not present

## 2014-02-24 DIAGNOSIS — M25569 Pain in unspecified knee: Secondary | ICD-10-CM

## 2014-02-24 DIAGNOSIS — N529 Male erectile dysfunction, unspecified: Secondary | ICD-10-CM

## 2014-02-24 DIAGNOSIS — M25561 Pain in right knee: Secondary | ICD-10-CM

## 2014-02-24 DIAGNOSIS — M549 Dorsalgia, unspecified: Secondary | ICD-10-CM

## 2014-02-24 MED ORDER — MELOXICAM 15 MG PO TABS: 15.0000 mg | ORAL_TABLET | Freq: Every day | ORAL | Status: DC

## 2014-02-24 MED ORDER — TRAMADOL HCL 50 MG PO TABS: 50.0000 mg | ORAL_TABLET | Freq: Four times a day (QID) | ORAL | Status: DC | PRN

## 2014-02-24 NOTE — Progress Notes (Signed)
Pre-visit discussion using our clinic review tool. No additional management support is needed unless otherwise documented below in the visit note.  

## 2014-02-24 NOTE — Progress Notes (Signed)
Subjective:    Patient ID: Tommy Bauer, male    DOB: Apr 03, 1941, 73 y.o.   MRN: [id number]  HPI   73 year old patient who is seen today for followup.  She's complaint is right knee pain.  This has been intermittent for a number of months.  He does have a history of osteoarthritis and has been treated with both diclofenac and meloxicam. He has treated hypertension.  He has been out of multiple medications recently. He continues to smoke. He has a history of ADD and is requesting samples.  Denies any cardiopulmonary complaint.  Past Medical History  Diagnosis Date  . BACK PAIN 07/16/2008  . Carpal tunnel syndrome 10/15/2008  . COLONIC POLYPS, HX OF 06/08/2007  . ERECTILE DYSFUNCTION, ORGANIC 07/11/2007  . Headache(784.0) 07/11/2007  . HYPERTENSION 06/08/2007  . INGUINAL HERNIA 12/04/2007  . LEFT VENTRICULAR FUNCTION, DECREASED 11/12/2007  . PROSTATE SPECIFIC ANTIGEN, ELEVATED 05/06/2009  . TOBACCO ABUSE 11/06/2009  . ADENOCARCINOMA, PROSTATE 05/19/2010  . Prostate cancer 09/14/2009    prostateectomy,adenocarcinoma,gleason=4+3=7  . Contusion     left shoulder,left knee,s/p recent fall end of march 2013    History   Social History  . Marital Status: Married    Spouse Name: N/A    Number of Children: N/A  . Years of Education: N/A   Occupational History  .      retired Games developer   Social History Main Topics  . Smoking status: Current Every Day Smoker -- 0.50 packs/day for 60 years    Types: Cigarettes  . Smokeless tobacco: Never Used  . Alcohol Use: Yes     Comment: occasionally  . Drug Use: No  . Sexual Activity: Not on file   Other Topics Concern  . Not on file   Social History Narrative  . No narrative on file    Past Surgical History  Procedure Laterality Date  . Hernia repair      right ingunial  . Prostate surgery  09/14/2009    radical prostatectomy /gleason=4+3-7    Family History  Problem Relation Age of Onset  . Colon cancer Neg Hx     No Known  Allergies  Current Outpatient Prescriptions on File Prior to Visit  Medication Sig Dispense Refill  . acetaminophen (TYLENOL) 500 MG tablet Take 500 mg by mouth every 4 (four) hours as needed.      . benazepril (LOTENSIN) 40 MG tablet Take 1 tablet (40 mg total) by mouth daily.  90 tablet  3  . chlorthalidone (HYGROTON) 25 MG tablet Take 1 tablet (25 mg total) by mouth daily.  90 tablet  3  . cyclobenzaprine (FLEXERIL) 5 MG tablet Take 1 tablet (5 mg total) by mouth 3 (three) times daily as needed for muscle spasms.  30 tablet  1  . diclofenac sodium (VOLTAREN) 1 % GEL Apply 2 g topically 2 (two) times daily as needed. Shoulder and knee prn  100 g  2  . metoprolol succinate (TOPROL-XL) 100 MG 24 hr tablet Take 1 tablet (100 mg total) by mouth daily. Take with or immediately following a meal.  90 tablet  3  . potassium chloride SA (K-DUR,KLOR-CON) 20 MEQ tablet Take 1 tablet (20 mEq total) by mouth daily.  90 tablet  3  . sildenafil (VIAGRA) 100 MG tablet Take 1 tablet (100 mg total) by mouth daily as needed. As directed  10 tablet  1   No current facility-administered medications on file prior to visit.    BP 160/90  Pulse 83  Temp(Src) 98.8 F (37.1 C) (Oral)  Resp 20  Ht 6' (1.829 m)  Wt 218 lb (98.884 kg)  BMI 29.56 kg/m2  SpO2 97%     Review of Systems  Constitutional: Negative for fever, chills, appetite change and fatigue.  HENT: Negative for congestion, dental problem, ear pain, hearing loss, sore throat, tinnitus, trouble swallowing and voice change.   Eyes: Negative for pain, discharge and visual disturbance.  Respiratory: Negative for cough, chest tightness, wheezing and stridor.   Cardiovascular: Negative for chest pain, palpitations and leg swelling.  Gastrointestinal: Negative for nausea, vomiting, abdominal pain, diarrhea, constipation, blood in stool and abdominal distention.  Genitourinary: Negative for urgency, hematuria, flank pain, discharge, difficulty  urinating and genital sores.  Musculoskeletal: Positive for arthralgias and joint swelling. Negative for back pain, gait problem, myalgias and neck stiffness.  Skin: Negative for rash.  Neurological: Negative for dizziness, syncope, speech difficulty, weakness, numbness and headaches.  Hematological: Negative for adenopathy. Does not bruise/bleed easily.  Psychiatric/Behavioral: Negative for behavioral problems and dysphoric mood. The patient is not nervous/anxious.        Objective:   Physical Exam  Constitutional: He is oriented to person, place, and time. He appears well-developed.  Blood pressure 160/90  HENT:  Head: Normocephalic.  Right Ear: External ear normal.  Left Ear: External ear normal.  Eyes: Conjunctivae and EOM are normal.  Neck: Normal range of motion.  Cardiovascular: Normal rate and normal heart sounds.   Pulmonary/Chest: Breath sounds normal.  Abdominal: Bowel sounds are normal.  Musculoskeletal: Normal range of motion. He exhibits no edema and no tenderness.  Right knee, slightly warm to touch.  Suggestion of a small effusion  Neurological: He is alert and oriented to person, place, and time.  Psychiatric: He has a normal mood and affect. His behavior is normal.          Assessment & Plan:  Right knee pain.  Probable osteoarthritis.  History of prior knee injury Osteoarthritis Hypertension.  Suboptimal control secondary to noncompliance Ongoing tobacco use ED.  Samples of Viagra, dispensed

## 2014-02-24 NOTE — Patient Instructions (Signed)
Limit your sodium (Salt) intake  Please check your blood pressure on a regular basis.  If it is consistently greater than 150/90, please make an office appointment.  Return in 6 months for follow-up   

## 2014-02-25 ENCOUNTER — Telehealth: Payer: Self-pay | Admitting: Internal Medicine

## 2014-02-25 NOTE — Telephone Encounter (Signed)
Relevant patient education mailed to patient.  

## 2014-03-04 ENCOUNTER — Telehealth: Payer: Self-pay | Admitting: Internal Medicine

## 2014-03-04 MED ORDER — DICLOFENAC SODIUM 1 % TD GEL: 2.0000 g | Freq: Two times a day (BID) | TRANSDERMAL | Status: DC | PRN

## 2014-03-04 MED ORDER — CYCLOBENZAPRINE HCL 5 MG PO TABS: 5.0000 mg | ORAL_TABLET | Freq: Three times a day (TID) | ORAL | Status: DC | PRN

## 2014-03-04 NOTE — Telephone Encounter (Signed)
Spoke to pt's daughter Camie,  told her I sent refills in for Voltaren Gel and Flexeril and he should have refills left on other medications just needs to let pharmacy know he needs refills. Camie verbalized understanding.

## 2014-03-04 NOTE — Telephone Encounter (Signed)
Pt daughter called and would like Butch Penny to call her concerning  Mr Russman meds    Phone number; [phone]

## 2014-03-19 ENCOUNTER — Ambulatory Visit: Payer: Medicare Other | Admitting: Internal Medicine

## 2014-06-03 ENCOUNTER — Encounter: Payer: Self-pay | Admitting: Internal Medicine

## 2014-06-03 ENCOUNTER — Ambulatory Visit (INDEPENDENT_AMBULATORY_CARE_PROVIDER_SITE_OTHER): Payer: Medicare Other | Admitting: Internal Medicine

## 2014-06-03 VITALS — BP 150/80 | HR 55 | Temp 98.0°F | Resp 20 | Ht 72.0 in | Wt 217.0 lb

## 2014-06-03 DIAGNOSIS — I2510 Atherosclerotic heart disease of native coronary artery without angina pectoris: Secondary | ICD-10-CM

## 2014-06-03 DIAGNOSIS — F17200 Nicotine dependence, unspecified, uncomplicated: Secondary | ICD-10-CM | POA: Diagnosis not present

## 2014-06-03 DIAGNOSIS — R1013 Epigastric pain: Secondary | ICD-10-CM

## 2014-06-03 DIAGNOSIS — I10 Essential (primary) hypertension: Secondary | ICD-10-CM | POA: Diagnosis not present

## 2014-06-03 MED ORDER — CYCLOBENZAPRINE HCL 5 MG PO TABS: 5.0000 mg | ORAL_TABLET | Freq: Three times a day (TID) | ORAL | Status: DC | PRN

## 2014-06-03 NOTE — Patient Instructions (Signed)
Smoking tobacco is very bad for your health. You should stop smoking immediately.  Prilosec one daily for 2 weeks  Hold  Mobic  Avoids foods high in acid such as tomatoes citrus juices, and spicy foods.  Avoid eating within two hours of lying down or before exercising.  Do not overheat.  Try smaller more frequent meals.  If symptoms persist, elevate the head of her bed 12 inches while sleeping.  Return in 6 months for follow-up

## 2014-06-03 NOTE — Progress Notes (Signed)
Subjective:    Patient ID: Tommy Bauer, male    DOB: [birth year], 73 y.o.   MRN: [id number]  HPI 73 year old patient who is seen today for followup.  He has hypertension, ongoing tobacco use, left ventricular dysfunction, and osteoarthritis. His main complaint today is abdominal pain.  This has been present for about 4 weeks.  It occurs every 2-3 days.  Pain is described as at times severe across the midabdominal area.  No nausea, vomiting, or change in his bowel habits. He does have a history of osteoarthritis and is on daily Mobic.  Past Medical History  Diagnosis Date  . BACK PAIN 07/16/2008  . Carpal tunnel syndrome 10/15/2008  . COLONIC POLYPS, HX OF 06/08/2007  . ERECTILE DYSFUNCTION, ORGANIC 07/11/2007  . Headache(784.0) 07/11/2007  . HYPERTENSION 06/08/2007  . INGUINAL HERNIA 12/04/2007  . LEFT VENTRICULAR FUNCTION, DECREASED 11/12/2007  . PROSTATE SPECIFIC ANTIGEN, ELEVATED 05/06/2009  . TOBACCO ABUSE 11/06/2009  . ADENOCARCINOMA, PROSTATE 05/19/2010  . Prostate cancer 09/14/2009    prostateectomy,adenocarcinoma,gleason=4+3=7  . Contusion     left shoulder,left knee,s/p recent fall end of march 2013    History   Social History  . Marital Status: Married    Spouse Name: N/A    Number of Children: N/A  . Years of Education: N/A   Occupational History  .      retired Games developer   Social History Main Topics  . Smoking status: Current Every Day Smoker -- 0.50 packs/day for 60 years    Types: Cigarettes  . Smokeless tobacco: Never Used  . Alcohol Use: Yes     Comment: occasionally  . Drug Use: No  . Sexual Activity: Not on file   Other Topics Concern  . Not on file   Social History Narrative  . No narrative on file    Past Surgical History  Procedure Laterality Date  . Hernia repair      right ingunial  . Prostate surgery  09/14/2009    radical prostatectomy /gleason=4+3-7    Family History  Problem Relation Age of Onset  . Colon cancer Neg Hx     No Known  Allergies  Current Outpatient Prescriptions on File Prior to Visit  Medication Sig Dispense Refill  . acetaminophen (TYLENOL) 500 MG tablet Take 500 mg by mouth every 4 (four) hours as needed.      . benazepril (LOTENSIN) 40 MG tablet Take 1 tablet (40 mg total) by mouth daily.  90 tablet  3  . chlorthalidone (HYGROTON) 25 MG tablet Take 1 tablet (25 mg total) by mouth daily.  90 tablet  3  . diclofenac sodium (VOLTAREN) 1 % GEL Apply 2 g topically 2 (two) times daily as needed. Shoulder and knee prn  100 g  2  . meloxicam (MOBIC) 15 MG tablet Take 1 tablet (15 mg total) by mouth daily.  90 tablet  1  . metoprolol succinate (TOPROL-XL) 100 MG 24 hr tablet Take 1 tablet (100 mg total) by mouth daily. Take with or immediately following a meal.  90 tablet  3  . potassium chloride SA (K-DUR,KLOR-CON) 20 MEQ tablet Take 1 tablet (20 mEq total) by mouth daily.  90 tablet  3  . sildenafil (VIAGRA) 100 MG tablet Take 1 tablet (100 mg total) by mouth daily as needed. As directed  10 tablet  1  . traMADol (ULTRAM) 50 MG tablet Take 1 tablet (50 mg total) by mouth every 6 (six) hours as needed.  60 tablet  3   No current facility-administered medications on file prior to visit.    BP 150/80  Pulse 55  Temp(Src) 98 F (36.7 C) (Oral)  Resp 20  Ht 6' (1.829 m)  Wt 217 lb (98.431 kg)  BMI 29.42 kg/m2  SpO2 97%       Review of Systems  Constitutional: Negative for fever, chills, appetite change and fatigue.  HENT: Negative for congestion, dental problem, ear pain, hearing loss, sore throat, tinnitus, trouble swallowing and voice change.   Eyes: Negative for pain, discharge and visual disturbance.  Respiratory: Negative for cough, chest tightness, wheezing and stridor.   Cardiovascular: Negative for chest pain, palpitations and leg swelling.  Gastrointestinal: Positive for abdominal pain. Negative for nausea, vomiting, diarrhea, constipation, blood in stool and abdominal distention.    Genitourinary: Negative for urgency, hematuria, flank pain, discharge, difficulty urinating and genital sores.  Musculoskeletal: Positive for arthralgias and back pain. Negative for gait problem, joint swelling, myalgias and neck stiffness.  Skin: Negative for rash.  Neurological: Negative for dizziness, syncope, speech difficulty, weakness, numbness and headaches.  Hematological: Negative for adenopathy. Does not bruise/bleed easily.  Psychiatric/Behavioral: Negative for behavioral problems and dysphoric mood. The patient is not nervous/anxious.        Objective:   Physical Exam  Constitutional: He is oriented to person, place, and time. He appears well-developed.  HENT:  Head: Normocephalic.  Right Ear: External ear normal.  Left Ear: External ear normal.  Eyes: Conjunctivae and EOM are normal.  Neck: Normal range of motion.  Cardiovascular: Normal rate and normal heart sounds.   Pulmonary/Chest: Breath sounds normal.  Abdominal: Soft. Bowel sounds are normal. He exhibits no distension and no mass. There is no tenderness. There is no rebound and no guarding.  Musculoskeletal: Normal range of motion. He exhibits no edema and no tenderness.  Neurological: He is alert and oriented to person, place, and time.  Psychiatric: He has a normal mood and affect. His behavior is normal.          Assessment & Plan:   Abdominal pain.  Asymptomatic at present, and exam is benign.  We'll minimize use of anti-inflammatory medication and use tramadol for pain hypertension, well controlled History of LV dysfunction, stable Ongoing tobacco use.  Total smoking cessation encouraged  CPX 6 months Short-term PPI therapy Entire reflux regimen We'll call if unimproved

## 2014-06-03 NOTE — Progress Notes (Signed)
Pre visit review using our clinic review tool, if applicable. No additional management support is needed unless otherwise documented below in the visit note. 

## 2014-06-04 ENCOUNTER — Telehealth: Payer: Self-pay | Admitting: Internal Medicine

## 2014-06-04 NOTE — Telephone Encounter (Signed)
Relevant patient education mailed to patient.  

## 2014-07-30 DIAGNOSIS — C61 Malignant neoplasm of prostate: Secondary | ICD-10-CM | POA: Diagnosis not present

## 2014-08-06 DIAGNOSIS — N5201 Erectile dysfunction due to arterial insufficiency: Secondary | ICD-10-CM | POA: Diagnosis not present

## 2014-08-06 DIAGNOSIS — Z8546 Personal history of malignant neoplasm of prostate: Secondary | ICD-10-CM | POA: Diagnosis not present

## 2014-08-26 ENCOUNTER — Ambulatory Visit: Payer: Medicare Other | Admitting: Internal Medicine

## 2014-09-17 ENCOUNTER — Ambulatory Visit (INDEPENDENT_AMBULATORY_CARE_PROVIDER_SITE_OTHER): Payer: Medicare Other | Admitting: Internal Medicine

## 2014-09-17 ENCOUNTER — Encounter: Payer: Self-pay | Admitting: Internal Medicine

## 2014-09-17 VITALS — BP 160/100 | HR 77 | Temp 98.3°F | Resp 20 | Ht 72.0 in | Wt 221.0 lb

## 2014-09-17 DIAGNOSIS — R42 Dizziness and giddiness: Secondary | ICD-10-CM | POA: Diagnosis not present

## 2014-09-17 DIAGNOSIS — I10 Essential (primary) hypertension: Secondary | ICD-10-CM | POA: Diagnosis not present

## 2014-09-17 DIAGNOSIS — I2510 Atherosclerotic heart disease of native coronary artery without angina pectoris: Secondary | ICD-10-CM

## 2014-09-17 DIAGNOSIS — Z23 Encounter for immunization: Secondary | ICD-10-CM | POA: Diagnosis not present

## 2014-09-17 NOTE — Progress Notes (Signed)
Subjective:    Patient ID: Tommy Bauer, male    DOB: 09/22/1941, 73 y.o.   MRN: [id number]  HPI 73 year old patient who has treated hypertension.  For the past week he has had some mild dizziness and cold symptoms.  He states when he first awakens in the morning.  He has some brief dizziness when he gets out of bed.  This does not reoccur throughout the day. He has been compliant with his antihypertensive medications.    Past Medical History  Diagnosis Date  . BACK PAIN 07/16/2008  . Carpal tunnel syndrome 10/15/2008  . COLONIC POLYPS, HX OF 06/08/2007  . ERECTILE DYSFUNCTION, ORGANIC 07/11/2007  . Headache(784.0) 07/11/2007  . HYPERTENSION 06/08/2007  . INGUINAL HERNIA 12/04/2007  . LEFT VENTRICULAR FUNCTION, DECREASED 11/12/2007  . PROSTATE SPECIFIC ANTIGEN, ELEVATED 05/06/2009  . TOBACCO ABUSE 11/06/2009  . ADENOCARCINOMA, PROSTATE 05/19/2010  . Prostate cancer 09/14/2009    prostateectomy,adenocarcinoma,gleason=4+3=7  . Contusion     left shoulder,left knee,s/p recent fall end of march 2013    History   Social History  . Marital Status: Married    Spouse Name: N/A    Number of Children: N/A  . Years of Education: N/A   Occupational History  .      retired Games developer   Social History Main Topics  . Smoking status: Current Every Day Smoker -- 0.50 packs/day for 60 years    Types: Cigarettes  . Smokeless tobacco: Never Used  . Alcohol Use: Yes     Comment: occasionally  . Drug Use: No  . Sexual Activity: Not on file   Other Topics Concern  . Not on file   Social History Narrative    Past Surgical History  Procedure Laterality Date  . Hernia repair      right ingunial  . Prostate surgery  09/14/2009    radical prostatectomy /gleason=4+3-7    Family History  Problem Relation Age of Onset  . Colon cancer Neg Hx     No Known Allergies  Current Outpatient Prescriptions on File Prior to Visit  Medication Sig Dispense Refill  . acetaminophen (TYLENOL) 500 MG tablet  Take 500 mg by mouth every 4 (four) hours as needed.    . benazepril (LOTENSIN) 40 MG tablet Take 1 tablet (40 mg total) by mouth daily. 90 tablet 3  . chlorthalidone (HYGROTON) 25 MG tablet Take 1 tablet (25 mg total) by mouth daily. 90 tablet 3  . cyclobenzaprine (FLEXERIL) 5 MG tablet Take 1 tablet (5 mg total) by mouth 3 (three) times daily as needed for muscle spasms. 30 tablet 2  . diclofenac sodium (VOLTAREN) 1 % GEL Apply 2 g topically 2 (two) times daily as needed. Shoulder and knee prn 100 g 2  . meloxicam (MOBIC) 15 MG tablet Take 1 tablet (15 mg total) by mouth daily. 90 tablet 1  . sildenafil (VIAGRA) 100 MG tablet Take 1 tablet (100 mg total) by mouth daily as needed. As directed 10 tablet 1  . traMADol (ULTRAM) 50 MG tablet Take 1 tablet (50 mg total) by mouth every 6 (six) hours as needed. 60 tablet 3  . metoprolol succinate (TOPROL-XL) 100 MG 24 hr tablet Take 1 tablet (100 mg total) by mouth daily. Take with or immediately following a meal. 90 tablet 3  . potassium chloride SA (K-DUR,KLOR-CON) 20 MEQ tablet Take 1 tablet (20 mEq total) by mouth daily. 90 tablet 3   No current facility-administered medications on file prior to visit.  BP 160/100 mmHg  Pulse 77  Temp(Src) 98.3 F (36.8 C) (Oral)  Resp 20  Ht 6' (1.829 m)  Wt 221 lb (100.245 kg)  BMI 29.97 kg/m2  SpO2 97%      Review of Systems  Constitutional: Negative for fever, chills, appetite change and fatigue.  HENT: Negative for congestion, dental problem, ear pain, hearing loss, sore throat, tinnitus, trouble swallowing and voice change.   Eyes: Negative for pain, discharge and visual disturbance.  Respiratory: Negative for cough, chest tightness, wheezing and stridor.   Cardiovascular: Negative for chest pain, palpitations and leg swelling.  Gastrointestinal: Negative for nausea, vomiting, abdominal pain, diarrhea, constipation, blood in stool and abdominal distention.  Genitourinary: Negative for  urgency, hematuria, flank pain, discharge, difficulty urinating and genital sores.  Musculoskeletal: Negative for myalgias, back pain, joint swelling, arthralgias, gait problem and neck stiffness.  Skin: Negative for rash.  Neurological: Positive for dizziness and light-headedness. Negative for syncope, speech difficulty, weakness, numbness and headaches.  Hematological: Negative for adenopathy. Does not bruise/bleed easily.  Psychiatric/Behavioral: Negative for behavioral problems and dysphoric mood. The patient is not nervous/anxious.        Objective:   Physical Exam  Constitutional: He is oriented to person, place, and time. He appears well-developed.  Blood pressure 150/90 without orthostatic change  HENT:  Head: Normocephalic.  Right Ear: External ear normal.  Left Ear: External ear normal.  Eyes: Conjunctivae and EOM are normal.  Neck: Normal range of motion.  Cardiovascular: Normal rate and normal heart sounds.   Pulmonary/Chest: Breath sounds normal.  Abdominal: Bowel sounds are normal.  Musculoskeletal: Normal range of motion. He exhibits no edema or tenderness.  Neurological: He is alert and oriented to person, place, and time.  Psychiatric: He has a normal mood and affect. His behavior is normal.          Assessment & Plan:   Hypertension.  Low-salt diet.  Compliance issues addressed.  Reassess blood pressure at the time his physical after the first of the year Dizziness.  Doubt this is related orthostatic hypotension.  Possible mild positional vertigo.  Will continue to observe

## 2014-09-17 NOTE — Progress Notes (Signed)
Pre visit review using our clinic review tool, if applicable. No additional management support is needed unless otherwise documented below in the visit note. 

## 2014-09-17 NOTE — Patient Instructions (Signed)
Limit your sodium (Salt) intake  Please check your blood pressure on a regular basis.  If it is consistently greater than 150/90, please make an office appointment.    It is important that you exercise regularly, at least 20 minutes 3 to 4 times per week.  If you develop chest pain or shortness of breath seek  medical attention.   Call or return to clinic prn if these symptoms worsen or fail to improve as anticipated.  

## 2014-09-29 ENCOUNTER — Telehealth: Payer: Self-pay | Admitting: Internal Medicine

## 2014-09-29 NOTE — Telephone Encounter (Signed)
Patient Information:  Caller Name: Ronney  Phone: [phone]  Patient: Maritza, [place]  Gender: Male  DOB: 28-May-1941  Age: 73 Years  PCP: Bluford Kaufmann (Family Practice > 68yrs old)  Office Follow Up:  Does the office need to follow up with this patient?: Yes  Instructions For The Office: Please f/u with pt concerning appt needs or provider recommendations, thank you  RN Note:  Pt states he does not have any transportation and difficult to get anyone to bring him.  Pt reports seen in the office 09/17/14.  Symptoms  Reason For Call & Symptoms: Pt reports dizziness, that worsens with bending over or looking up.   Reviewed Health History In EMR: Yes  Reviewed Medications In EMR: Yes  Reviewed Allergies In EMR: Yes  Reviewed Surgeries / Procedures: Yes  Date of Onset of Symptoms: 09/26/2014  Guideline(s) Used:  Dizziness  Disposition Per Guideline:   Discuss with PCP and Callback by Nurse Today  Reason For Disposition Reached:   Taking a medicine that could cause dizziness (e.g., blood pressure medications, diuretics)  Advice Given:  N/A  Patient Will Follow Care Advice:  YES

## 2014-09-29 NOTE — Telephone Encounter (Signed)
Pt wife is calling to report her husband now has a headache

## 2014-09-29 NOTE — Telephone Encounter (Signed)
See other message

## 2014-09-29 NOTE — Telephone Encounter (Signed)
Spoke to pt, asked how he was? Pt states he is feeling better now, headache and dizziness is gone. Asked pt if it is first thing in the morning when he wakes up? Pt said yes. Told pt to get up out of bed slowly and sit on the edge of the bed before standing. Pt verbalized understanding. Told pt Dr. Raliegh Ip said to add Tylenol to Mobic and take Tramadol as prescribed. Pt verbalized understanding. Dr.K notified of pt's condition.

## 2014-09-29 NOTE — Telephone Encounter (Signed)
Daughter and wife would like a call from [place] to discuss Mr [facility] medical condition.

## 2014-09-29 NOTE — Telephone Encounter (Signed)
Wife called and said patient called her with high BP and was not feeling well at all. I told her that I would call him to work him in. After talking with him I transferred him to call a nurse. He was dizzy, having trouble looking up and said his head was hurting badly.

## 2014-09-29 NOTE — Telephone Encounter (Signed)
Please call and check on status Add Tylenol to Mobic and  tramadol regimen

## 2014-11-19 ENCOUNTER — Ambulatory Visit (INDEPENDENT_AMBULATORY_CARE_PROVIDER_SITE_OTHER): Payer: Medicare Other | Admitting: Internal Medicine

## 2014-11-19 ENCOUNTER — Encounter: Payer: Self-pay | Admitting: Internal Medicine

## 2014-11-19 DIAGNOSIS — Z86010 Personal history of colonic polyps: Secondary | ICD-10-CM

## 2014-11-19 DIAGNOSIS — I2510 Atherosclerotic heart disease of native coronary artery without angina pectoris: Secondary | ICD-10-CM

## 2014-11-19 DIAGNOSIS — Z0000 Encounter for general adult medical examination without abnormal findings: Secondary | ICD-10-CM | POA: Diagnosis not present

## 2014-11-19 DIAGNOSIS — E785 Hyperlipidemia, unspecified: Secondary | ICD-10-CM | POA: Diagnosis not present

## 2014-11-19 DIAGNOSIS — Z23 Encounter for immunization: Secondary | ICD-10-CM

## 2014-11-19 LAB — LIPID PANEL
CHOL/HDL RATIO: 4
Cholesterol: 206 mg/dL — ABNORMAL HIGH (ref 0–200)
HDL: 52.4 mg/dL (ref >39.00)
LDL CALC: 129 mg/dL — AB (ref 0–99)
NonHDL: 153.6
TRIGLYCERIDES: 123 mg/dL (ref 0.0–149.0)
VLDL: 24.6 mg/dL (ref 0.0–40.0)

## 2014-11-19 LAB — CBC WITH DIFFERENTIAL/PLATELET
BASOS PCT: 0.5 % (ref 0.0–3.0)
Basophils Absolute: 0 10*3/uL (ref 0.0–0.1)
Eosinophils Absolute: 0.3 10*3/uL (ref 0.0–0.7)
Eosinophils Relative: 6 % — ABNORMAL HIGH (ref 0.0–5.0)
HEMATOCRIT: 47.7 % (ref 39.0–52.0)
Hemoglobin: 16.1 g/dL (ref 13.0–17.0)
Lymphocytes Relative: 22.9 % (ref 12.0–46.0)
Lymphs Abs: 1 10*3/uL (ref 0.7–4.0)
MCHC: 33.7 g/dL (ref 30.0–36.0)
MCV: 87.4 fl (ref 78.0–100.0)
MONOS PCT: 13.9 % — AB (ref 3.0–12.0)
Monocytes Absolute: 0.6 10*3/uL (ref 0.1–1.0)
NEUTROS ABS: 2.5 10*3/uL (ref 1.4–7.7)
Neutrophils Relative %: 56.7 % (ref 43.0–77.0)
Platelets: 256 10*3/uL (ref 150.0–400.0)
RBC: 5.46 Mil/uL (ref 4.22–5.81)
RDW: 15.6 % — ABNORMAL HIGH (ref 11.5–15.5)
WBC: 4.4 10*3/uL (ref 4.0–10.5)

## 2014-11-19 LAB — COMPREHENSIVE METABOLIC PANEL
ALBUMIN: 4.4 g/dL (ref 3.5–5.2)
ALT: 10 U/L (ref 0–53)
AST: 16 U/L (ref 0–37)
Alkaline Phosphatase: 50 U/L (ref 39–117)
BUN: 15 mg/dL (ref 6–23)
CO2: 33 mEq/L — ABNORMAL HIGH (ref 19–32)
Calcium: 10.3 mg/dL (ref 8.4–10.5)
Chloride: 102 mEq/L (ref 96–112)
Creatinine, Ser: 1.15 mg/dL (ref 0.40–1.50)
GFR: 79.99 mL/min (ref >60.00)
GLUCOSE: 68 mg/dL — AB (ref 70–99)
POTASSIUM: 4.5 meq/L (ref 3.5–5.1)
SODIUM: 141 meq/L (ref 135–145)
TOTAL PROTEIN: 7.6 g/dL (ref 6.0–8.3)
Total Bilirubin: 0.8 mg/dL (ref 0.2–1.2)

## 2014-11-19 LAB — TSH: TSH: 1.4 u[IU]/mL (ref 0.35–4.50)

## 2014-11-19 MED ORDER — BENAZEPRIL HCL 40 MG PO TABS: 40.0000 mg | ORAL_TABLET | Freq: Every day | ORAL | Status: DC

## 2014-11-19 MED ORDER — POTASSIUM CHLORIDE CRYS ER 20 MEQ PO TBCR: 20.0000 meq | EXTENDED_RELEASE_TABLET | Freq: Every day | ORAL | Status: DC

## 2014-11-19 MED ORDER — CHLORTHALIDONE 25 MG PO TABS: 25.0000 mg | ORAL_TABLET | Freq: Every day | ORAL | Status: DC

## 2014-11-19 MED ORDER — METOPROLOL SUCCINATE ER 100 MG PO TB24: 100.0000 mg | ORAL_TABLET | Freq: Every day | ORAL | Status: DC

## 2014-11-19 NOTE — Patient Instructions (Signed)
Urology follow-up as scheduled  Smoking tobacco is very bad for your health. You should stop smoking immediately.  Limit your sodium (Salt) intake    It is important that you exercise regularly, at least 20 minutes 3 to 4 times per week.  If you develop chest pain or shortness of breath seek  medical attention.  Health Maintenance A healthy lifestyle and preventative care can promote health and wellness.  Maintain regular health, dental, and eye exams.  Eat a healthy diet. Foods like vegetables, fruits, whole grains, low-fat dairy products, and lean protein foods contain the nutrients you need and are low in calories. Decrease your intake of foods high in solid fats, added sugars, and salt. Get information about a proper diet from your health care provider, if necessary.  Regular physical exercise is one of the most important things you can do for your health. Most adults should get at least 150 minutes of moderate-intensity exercise (any activity that increases your heart rate and causes you to sweat) each week. In addition, most adults need muscle-strengthening exercises on 2 or more days a week.   Maintain a healthy weight. The body mass index (BMI) is a screening tool to identify possible weight problems. It provides an estimate of body fat based on height and weight. Your health care provider can find your BMI and can help you achieve or maintain a healthy weight. For males 20 years and older:  A BMI below 18.5 is considered underweight.  A BMI of 18.5 to 24.9 is normal.  A BMI of 25 to 29.9 is considered overweight.  A BMI of 30 and above is considered obese.  Maintain normal blood lipids and cholesterol by exercising and minimizing your intake of saturated fat. Eat a balanced diet with plenty of fruits and vegetables. Blood tests for lipids and cholesterol should begin at age 9 and be repeated every 5 years. If your lipid or cholesterol levels are high, you are over age 56, or you  are at high risk for heart disease, you may need your cholesterol levels checked more frequently.Ongoing high lipid and cholesterol levels should be treated with medicines if diet and exercise are not working.  If you smoke, find out from your health care provider how to quit. If you do not use tobacco, do not start.  Lung cancer screening is recommended for adults aged 76-80 years who are at high risk for developing lung cancer because of a history of smoking. A yearly low-dose CT scan of the lungs is recommended for people who have at least a 30-pack-year history of smoking and are current smokers or have quit within the past 15 years. A pack year of smoking is smoking an average of 1 pack of cigarettes a day for 1 year (for example, a 30-pack-year history of smoking could mean smoking 1 pack a day for 30 years or 2 packs a day for 15 years). Yearly screening should continue until the smoker has stopped smoking for at least 15 years. Yearly screening should be stopped for people who develop a health problem that would prevent them from having lung cancer treatment.  If you choose to drink alcohol, do not have more than 2 drinks per day. One drink is considered to be 12 oz (360 mL) of beer, 5 oz (150 mL) of wine, or 1.5 oz (45 mL) of liquor.  Avoid the use of street drugs. Do not share needles with anyone. Ask for help if you need support or instructions  about stopping the use of drugs.  High blood pressure causes heart disease and increases the risk of stroke. Blood pressure should be checked at least every 1-2 years. Ongoing high blood pressure should be treated with medicines if weight loss and exercise are not effective.  If you are 47-15 years old, ask your health care provider if you should take aspirin to prevent heart disease.  Diabetes screening involves taking a blood sample to check your fasting blood sugar level. This should be done once every 3 years after age 73 if you are at a normal  weight and without risk factors for diabetes. Testing should be considered at a younger age or be carried out more frequently if you are overweight and have at least 1 risk factor for diabetes.  Colorectal cancer can be detected and often prevented. Most routine colorectal cancer screening begins at the age of 8 and continues through age 28. However, your health care provider may recommend screening at an earlier age if you have risk factors for colon cancer. On a yearly basis, your health care provider may provide home test kits to check for hidden blood in the stool. A small camera at the end of a tube may be used to directly examine the colon (sigmoidoscopy or colonoscopy) to detect the earliest forms of colorectal cancer. Talk to your health care provider about this at age 10 when routine screening begins. A direct exam of the colon should be repeated every 5-10 years through age 36, unless early forms of precancerous polyps or small growths are found.  People who are at an increased risk for hepatitis B should be screened for this virus. You are considered at high risk for hepatitis B if:  You were born in a country where hepatitis B occurs often. Talk with your health care provider about which countries are considered high risk.  Your parents were born in a high-risk country and you have not received a shot to protect against hepatitis B (hepatitis B vaccine).  You have HIV or AIDS.  You use needles to inject street drugs.  You live with, or have sex with, someone who has hepatitis B.  You are a man who has sex with other men (MSM).  You get hemodialysis treatment.  You take certain medicines for conditions like cancer, organ transplantation, and autoimmune conditions.  Hepatitis C blood testing is recommended for all people born from [birth year] through 1965 and any individual with known risk factors for hepatitis C.  Healthy men should no longer receive prostate-specific antigen (PSA) blood  tests as part of routine cancer screening. Talk to your health care provider about prostate cancer screening.  Testicular cancer screening is not recommended for adolescents or adult males who have no symptoms. Screening includes self-exam, a health care provider exam, and other screening tests. Consult with your health care provider about any symptoms you have or any concerns you have about testicular cancer.  Practice safe sex. Use condoms and avoid high-risk sexual practices to reduce the spread of sexually transmitted infections (STIs).  You should be screened for STIs, including gonorrhea and chlamydia if:  You are sexually active and are younger than 24 years.  You are older than 24 years, and your health care provider tells you that you are at risk for this type of infection.  Your sexual activity has changed since you were last screened, and you are at an increased risk for chlamydia or gonorrhea. Ask your health care provider if you  are at risk.  If you are at risk of being infected with HIV, it is recommended that you take a prescription medicine daily to prevent HIV infection. This is called pre-exposure prophylaxis (PrEP). You are considered at risk if:  You are a man who has sex with other men (MSM).  You are a heterosexual man who is sexually active with multiple partners.  You take drugs by injection.  You are sexually active with a partner who has HIV.  Talk with your health care provider about whether you are at high risk of being infected with HIV. If you choose to begin PrEP, you should first be tested for HIV. You should then be tested every 3 months for as long as you are taking PrEP.  Use sunscreen. Apply sunscreen liberally and repeatedly throughout the day. You should seek shade when your shadow is shorter than you. Protect yourself by wearing long sleeves, pants, a wide-brimmed hat, and sunglasses year round whenever you are outdoors.  Tell your health care  provider of new moles or changes in moles, especially if there is a change in shape or color. Also, tell your health care provider if a mole is larger than the size of a pencil eraser.  A one-time screening for abdominal aortic aneurysm (AAA) and surgical repair of large AAAs by ultrasound is recommended for men aged 69-75 years who are current or former smokers.  Stay current with your vaccines (immunizations). Document Released: 04/14/2008 Document Revised: 10/22/2013 Document Reviewed: 03/14/2011 [facility] Patient Information 2015 [place], [place]. This information is not intended to replace advice given to you by your health care provider. Make sure you discuss any questions you have with your health care provider.

## 2014-11-19 NOTE — Progress Notes (Signed)
Pre visit review using our clinic review tool, if applicable. No additional management support is needed unless otherwise documented below in the visit note. 

## 2014-11-19 NOTE — Progress Notes (Signed)
Patient ID: Tommy Bauer, male   DOB: October 07, 1941, 74 y.o.   MRN: [id number]  Subjective:    Patient ID: Tommy Bauer, male    DOB: July 09, 1941, 74 y.o.   MRN: [id number]  HPI Pre-visit discussion using our clinic review tool. No additional management support is needed unless otherwise documented below in the visit note.    CC: cpx- doing well.   History of Present Illness:   74  year-old patient who is seen today for a comprehensive evaluation.  Medical problems include a history of LV dysfunction. He has hypertension, colonic polyps, and a history of ongoing tobacco use. . denies any symptoms of congestive heart failure. No exertional chest pain. He has decreased his tobacco use, to just a few cigarettes daily. Total cessation discussed and encouraged.  He has  completed radiation treatment for extra capsular extension of prostate cancer two  years ago and is being followed by urology.   Here for Medicare AWV:   1. Risk factors based on Past M, S, F history: patient has a history of LV dysfunction, hypertension, and tobacco use  2. Physical Activities: no limitations  3. Depression/mood: no history of depression or mood disorder  4. Hearing: no hearing deficits  5. ADL's: remains independent in all aspects of daily living  6. Fall Risk: low  7. Home Safety: no problems identified  8. Height, weight, &visual acuity:height and weight are stable. No difficulty with visual acuity  9. Counseling: total cessation of all tobacco products. Encouraged  10. Labs ordered based on risk factors: laboratory studies, including PSA will be reviewed  11. Referral Coordination-followed urology. This fall  12. Care Plan- heart healthy, salt restricted diet more regular exercise and modest weight loss. All encouraged  13. Cognitive Assessment- alert and oriented, with normal affect  14.  Preventive services will include annual health assessments.  Annual examinations.  Will be continued as well as annual or  biannual urology follow-up.  Patient was provided with a written and personalized care plan 15.  Provider list update includes primary care medicine, ophthalmology and urology.  GI for colonoscopies every 5 years  Preventive Screening-Counseling & Management  Alcohol-Tobacco  Smoking Status: current  Smoking Cessation Counseling: yes   Allergies (verified):  No Known Drug Allergies   Past History:  Past Medical History:    Colonic polyps, hx of ; colonoscopy may 2013 Hypertension  ED  LVD  R inguinal hernia-repaired  prostate cancer status post prostatectomy and radiotherapy  Past Surgical History:   Colonoscopy October2007 May 2013 Cardiolite stress test September 2007  status post radical prostatectomy November 2010 (T1C NO MO)   Family History:   details of his father's health unknown.  Mother died age 33, cancer, unclear type  III sisters, one deceased, age 57 cancer, unclear type;  another died Ca-leukemia  one brother died-trauma and secondary infection   Social History:   Current Smoker  Married  one son  still works doing Chief of Staff      Review of Systems  Constitutional: Negative for fever, chills, activity change, appetite change and fatigue.  HENT: Negative for congestion, dental problem, ear pain, hearing loss, mouth sores, rhinorrhea, sinus pressure, sneezing, tinnitus, trouble swallowing and voice change.   Eyes: Negative for photophobia, pain, redness and visual disturbance.  Respiratory: Negative for apnea, cough, choking, chest tightness, shortness of breath and wheezing.   Cardiovascular: Negative for chest pain, palpitations and leg swelling.  Gastrointestinal: Negative for nausea, vomiting, abdominal pain, diarrhea, constipation,  blood in stool, abdominal distention, anal bleeding and rectal pain.  Genitourinary: Negative for dysuria, urgency, frequency, hematuria, flank pain, decreased urine volume, discharge, penile swelling,  scrotal swelling, difficulty urinating, genital sores and testicular pain.  Musculoskeletal: Negative for myalgias, back pain, joint swelling, arthralgias, gait problem, neck pain and neck stiffness.  Skin: Negative for color change, rash and wound.  Neurological: Negative for dizziness, tremors, seizures, syncope, facial asymmetry, speech difficulty, weakness, light-headedness, numbness and headaches.  Hematological: Negative for adenopathy. Does not bruise/bleed easily.  Psychiatric/Behavioral: Negative for suicidal ideas, hallucinations, behavioral problems, confusion, sleep disturbance, self-injury, dysphoric mood, decreased concentration and agitation. The patient is not nervous/anxious.        Objective:   Physical Exam  Constitutional: He appears well-developed and well-nourished. No distress.  HENT:  Head: Normocephalic and atraumatic.  Right Ear: External ear normal.  Left Ear: External ear normal.  Nose: Nose normal.  Mouth/Throat: Oropharynx is clear and moist.  Eyes: Conjunctivae and EOM are normal. Pupils are equal, round, and reactive to light. No scleral icterus.  Neck: Normal range of motion. Neck supple. No JVD present. No thyromegaly present.  Cardiovascular: Regular rhythm, normal heart sounds and intact distal pulses.  Exam reveals no gallop and no friction rub.   No murmur heard. Pedal pulses faint  Pulmonary/Chest: Effort normal and breath sounds normal. He exhibits no tenderness.  Abdominal: Soft. Bowel sounds are normal. He exhibits no distension and no mass. There is no tenderness.  Genitourinary: Penis normal. Guaiac negative stool.  Musculoskeletal: Normal range of motion. He exhibits no edema or tenderness.  Lymphadenopathy:    He has no cervical adenopathy.  Neurological: He is alert. He has normal reflexes. No cranial nerve deficit. Coordination normal.  Skin: Skin is warm and dry. No rash noted.  Psychiatric: He has a normal mood and affect. His behavior  is normal.          Assessment & Plan:    Preventive health Hypertension stable Left ventricular dysfunction. Asymptomatic History colonic polyps.  History of prostate cancer.  Followup urology in 6 months

## 2014-12-15 ENCOUNTER — Other Ambulatory Visit: Payer: Self-pay | Admitting: Internal Medicine

## 2015-05-19 ENCOUNTER — Encounter: Payer: Self-pay | Admitting: Internal Medicine

## 2015-05-19 ENCOUNTER — Ambulatory Visit (INDEPENDENT_AMBULATORY_CARE_PROVIDER_SITE_OTHER): Payer: Medicare Other | Admitting: Internal Medicine

## 2015-05-19 VITALS — BP 140/86 | HR 86 | Temp 99.2°F | Resp 20 | Ht 72.0 in | Wt 216.0 lb

## 2015-05-19 DIAGNOSIS — F17200 Nicotine dependence, unspecified, uncomplicated: Secondary | ICD-10-CM

## 2015-05-19 DIAGNOSIS — I2510 Atherosclerotic heart disease of native coronary artery without angina pectoris: Secondary | ICD-10-CM | POA: Diagnosis not present

## 2015-05-19 DIAGNOSIS — Z720 Tobacco use: Secondary | ICD-10-CM

## 2015-05-19 DIAGNOSIS — I10 Essential (primary) hypertension: Secondary | ICD-10-CM

## 2015-05-19 MED ORDER — POTASSIUM CHLORIDE CRYS ER 20 MEQ PO TBCR: 20.0000 meq | EXTENDED_RELEASE_TABLET | Freq: Every day | ORAL | Status: DC

## 2015-05-19 NOTE — Progress Notes (Signed)
Pre visit review using our clinic review tool, if applicable. No additional management support is needed unless otherwise documented below in the visit note. 

## 2015-05-19 NOTE — Patient Instructions (Signed)
Limit your sodium (Salt) intake    It is important that you exercise regularly, at least 20 minutes 3 to 4 times per week.  If you develop chest pain or shortness of breath seek  medical attention.  Please see your eye doctor yearly to check for eye damage  Please check your blood pressure on a regular basis.  If it is consistently greater than 150/90, please make an office appointment.  Smoking tobacco is very bad for your health. You should stop smoking immediately.

## 2015-05-19 NOTE — Progress Notes (Signed)
Subjective:    Patient ID: Tommy Bauer, male    DOB: [birth year], 74 y.o.   MRN: [id number]  HPI   74 year old patient who is seen today for his six-month follow-up. He is doing fairly well. He describes some mild vertigo when he overextends his neck when lying supine at night.  He has noticed some decreased visual acuity from the left eye.  He is scheduled to see ophthalmology next week  He has hypertension that has been well-controlled  Past Medical History  Diagnosis Date  . BACK PAIN 07/16/2008  . Carpal tunnel syndrome 10/15/2008  . COLONIC POLYPS, HX OF 06/08/2007  . ERECTILE DYSFUNCTION, ORGANIC 07/11/2007  . Headache(784.0) 07/11/2007  . HYPERTENSION 06/08/2007  . INGUINAL HERNIA 12/04/2007  . LEFT VENTRICULAR FUNCTION, DECREASED 11/12/2007  . PROSTATE SPECIFIC ANTIGEN, ELEVATED 05/06/2009  . TOBACCO ABUSE 11/06/2009  . ADENOCARCINOMA, PROSTATE 05/19/2010  . Prostate cancer 09/14/2009    prostateectomy,adenocarcinoma,gleason=4+3=7  . Contusion     left shoulder,left knee,s/p recent fall end of march 2013    History   Social History  . Marital Status: Married    Spouse Name: N/A  . Number of Children: N/A  . Years of Education: N/A   Occupational History  .      retired Games developer   Social History Main Topics  . Smoking status: Current Every Day Smoker -- 0.50 packs/day for 60 years    Types: Cigarettes  . Smokeless tobacco: Never Used  . Alcohol Use: Yes     Comment: occasionally  . Drug Use: No  . Sexual Activity: Not on file   Other Topics Concern  . Not on file   Social History Narrative    Past Surgical History  Procedure Laterality Date  . Hernia repair      right ingunial  . Prostate surgery  09/14/2009    radical prostatectomy /gleason=4+3-7    Family History  Problem Relation Age of Onset  . Colon cancer Neg Hx     No Known Allergies  Current Outpatient Prescriptions on File Prior to Visit  Medication Sig Dispense Refill  . acetaminophen  (TYLENOL) 500 MG tablet Take 500 mg by mouth every 4 (four) hours as needed.    . benazepril (LOTENSIN) 40 MG tablet Take 1 tablet (40 mg total) by mouth daily. 90 tablet 3  . chlorthalidone (HYGROTON) 25 MG tablet Take 1 tablet (25 mg total) by mouth daily. 90 tablet 3  . metoprolol succinate (TOPROL-XL) 100 MG 24 hr tablet Take 1 tablet (100 mg total) by mouth daily. Take with or immediately following a meal. 90 tablet 3   No current facility-administered medications on file prior to visit.    BP 140/86 mmHg  Pulse 86  Temp(Src) 99.2 F (37.3 C) (Oral)  Resp 20  Ht 6' (1.829 m)  Wt 216 lb (97.977 kg)  BMI 29.29 kg/m2  SpO2 96%     Review of Systems  Constitutional: Negative for fever, chills, appetite change and fatigue.  HENT: Negative for congestion, dental problem, ear pain, hearing loss, sore throat, tinnitus, trouble swallowing and voice change.   Eyes: Positive for visual disturbance. Negative for pain and discharge.  Respiratory: Negative for cough, chest tightness, wheezing and stridor.   Cardiovascular: Negative for chest pain, palpitations and leg swelling.  Gastrointestinal: Negative for nausea, vomiting, abdominal pain, diarrhea, constipation, blood in stool and abdominal distention.  Genitourinary: Negative for urgency, hematuria, flank pain, discharge, difficulty urinating and genital sores.  Musculoskeletal: Positive for  arthralgias. Negative for myalgias, back pain, joint swelling, gait problem and neck stiffness.  Skin: Negative for rash.  Neurological: Positive for dizziness. Negative for syncope, speech difficulty, weakness, numbness and headaches.  Hematological: Negative for adenopathy. Does not bruise/bleed easily.  Psychiatric/Behavioral: Negative for behavioral problems and dysphoric mood. The patient is not nervous/anxious.        Objective:   Physical Exam  Constitutional: He is oriented to person, place, and time. He appears well-developed.  Blood  pressure 140/80  HENT:  Head: Normocephalic.  Right Ear: External ear normal.  Left Ear: External ear normal.  Eyes: Conjunctivae and EOM are normal.  Neck: Normal range of motion.  Cardiovascular: Normal rate and normal heart sounds.   Pulmonary/Chest: Breath sounds normal.  Abdominal: Bowel sounds are normal.  Musculoskeletal: Normal range of motion. He exhibits no edema or tenderness.  Neurological: He is alert and oriented to person, place, and time.  Psychiatric: He has a normal mood and affect. His behavior is normal.          Assessment & Plan:    Hypertension, well-controlled.  No change in therapy.  Weight loss encouraged low-salt diet recommended Decreased visual acuity, left eye.  Patient encouraged to follow-up with ophthalmology next week as scheduled Tobacco use.  Total smoking cessation encouraged  CPX 6 months

## 2015-05-26 DIAGNOSIS — H2513 Age-related nuclear cataract, bilateral: Secondary | ICD-10-CM | POA: Diagnosis not present

## 2015-05-26 DIAGNOSIS — H35033 Hypertensive retinopathy, bilateral: Secondary | ICD-10-CM | POA: Diagnosis not present

## 2015-05-26 DIAGNOSIS — H31091 Other chorioretinal scars, right eye: Secondary | ICD-10-CM | POA: Diagnosis not present

## 2015-05-26 DIAGNOSIS — H3412 Central retinal artery occlusion, left eye: Secondary | ICD-10-CM | POA: Diagnosis not present

## 2015-07-14 DIAGNOSIS — H31091 Other chorioretinal scars, right eye: Secondary | ICD-10-CM | POA: Diagnosis not present

## 2015-07-14 DIAGNOSIS — H2513 Age-related nuclear cataract, bilateral: Secondary | ICD-10-CM | POA: Diagnosis not present

## 2015-07-14 DIAGNOSIS — H35033 Hypertensive retinopathy, bilateral: Secondary | ICD-10-CM | POA: Diagnosis not present

## 2015-07-14 DIAGNOSIS — H3412 Central retinal artery occlusion, left eye: Secondary | ICD-10-CM | POA: Diagnosis not present

## 2015-07-15 ENCOUNTER — Ambulatory Visit (INDEPENDENT_AMBULATORY_CARE_PROVIDER_SITE_OTHER): Payer: Medicare Other | Admitting: Internal Medicine

## 2015-07-15 ENCOUNTER — Encounter: Payer: Self-pay | Admitting: Internal Medicine

## 2015-07-15 VITALS — BP 150/76 | HR 56 | Temp 97.5°F | Resp 20 | Ht 72.0 in | Wt 218.0 lb

## 2015-07-15 DIAGNOSIS — H3412 Central retinal artery occlusion, left eye: Secondary | ICD-10-CM | POA: Diagnosis not present

## 2015-07-15 DIAGNOSIS — Z23 Encounter for immunization: Secondary | ICD-10-CM

## 2015-07-15 DIAGNOSIS — I2510 Atherosclerotic heart disease of native coronary artery without angina pectoris: Secondary | ICD-10-CM

## 2015-07-15 DIAGNOSIS — Z86010 Personal history of colonic polyps: Secondary | ICD-10-CM | POA: Diagnosis not present

## 2015-07-15 DIAGNOSIS — I10 Essential (primary) hypertension: Secondary | ICD-10-CM

## 2015-07-15 DIAGNOSIS — H3410 Central retinal artery occlusion, unspecified eye: Secondary | ICD-10-CM | POA: Insufficient documentation

## 2015-07-15 MED ORDER — ASPIRIN 81 MG PO TABS: 81.0000 mg | ORAL_TABLET | Freq: Every day | ORAL | Status: AC

## 2015-07-15 MED ORDER — ATORVASTATIN CALCIUM 20 MG PO TABS: 20.0000 mg | ORAL_TABLET | Freq: Every day | ORAL | Status: DC

## 2015-07-15 NOTE — Progress Notes (Signed)
Subjective:    Patient ID: Tommy Bauer, male    DOB: 1941-08-23, 74 y.o.   MRN: [id number]  HPI  74 year old patient who reports a greater than 1 year history of decreased vision involving the left medial eye.  He has been seen by ophthalmology recently and evaluation concerning for a possible central retinal artery stroke.  Patient denies any focal neurological symptoms. Cardio vascular risk factors include tobacco and hypertension.  Past Medical History  Diagnosis Date  . BACK PAIN 07/16/2008  . Carpal tunnel syndrome 10/15/2008  . COLONIC POLYPS, HX OF 06/08/2007  . ERECTILE DYSFUNCTION, ORGANIC 07/11/2007  . Headache(784.0) 07/11/2007  . HYPERTENSION 06/08/2007  . INGUINAL HERNIA 12/04/2007  . LEFT VENTRICULAR FUNCTION, DECREASED 11/12/2007  . PROSTATE SPECIFIC ANTIGEN, ELEVATED 05/06/2009  . TOBACCO ABUSE 11/06/2009  . ADENOCARCINOMA, PROSTATE 05/19/2010  . Prostate cancer 09/14/2009    prostateectomy,adenocarcinoma,gleason=4+3=7  . Contusion     left shoulder,left knee,s/p recent fall end of march 2013    Social History   Social History  . Marital Status: Married    Spouse Name: N/A  . Number of Children: N/A  . Years of Education: N/A   Occupational History  .      retired Games developer   Social History Main Topics  . Smoking status: Current Every Day Smoker -- 0.50 packs/day for 60 years    Types: Cigarettes  . Smokeless tobacco: Never Used  . Alcohol Use: Yes     Comment: occasionally  . Drug Use: No  . Sexual Activity: Not on file   Other Topics Concern  . Not on file   Social History Narrative    Past Surgical History  Procedure Laterality Date  . Hernia repair      right ingunial  . Prostate surgery  09/14/2009    radical prostatectomy /gleason=4+3-7    Family History  Problem Relation Age of Onset  . Colon cancer Neg Hx     No Known Allergies  Current Outpatient Prescriptions on File Prior to Visit  Medication Sig Dispense Refill  . acetaminophen  (TYLENOL) 500 MG tablet Take 500 mg by mouth every 4 (four) hours as needed.    . benazepril (LOTENSIN) 40 MG tablet Take 1 tablet (40 mg total) by mouth daily. 90 tablet 3  . chlorthalidone (HYGROTON) 25 MG tablet Take 1 tablet (25 mg total) by mouth daily. 90 tablet 3  . metoprolol succinate (TOPROL-XL) 100 MG 24 hr tablet Take 1 tablet (100 mg total) by mouth daily. Take with or immediately following a meal. 90 tablet 3  . potassium chloride SA (K-DUR,KLOR-CON) 20 MEQ tablet Take 1 tablet (20 mEq total) by mouth daily. 90 tablet 3   No current facility-administered medications on file prior to visit.    BP 150/76 mmHg  Pulse 56  Temp(Src) 97.5 F (36.4 C) (Oral)  Resp 20  Ht 6' (1.829 m)  Wt 218 lb (98.884 kg)  BMI 29.56 kg/m2  SpO2 96%     Review of Systems  Constitutional: Negative for fever, chills, appetite change and fatigue.  HENT: Negative for congestion, dental problem, ear pain, hearing loss, sore throat, tinnitus, trouble swallowing and voice change.   Eyes: Positive for visual disturbance. Negative for pain and discharge.  Respiratory: Negative for cough, chest tightness, wheezing and stridor.   Cardiovascular: Negative for chest pain, palpitations and leg swelling.  Gastrointestinal: Negative for nausea, vomiting, abdominal pain, diarrhea, constipation, blood in stool and abdominal distention.  Genitourinary: Negative for urgency,  hematuria, flank pain, discharge, difficulty urinating and genital sores.  Musculoskeletal: Negative for myalgias, back pain, joint swelling, arthralgias, gait problem and neck stiffness.  Skin: Negative for rash.  Neurological: Negative for dizziness, syncope, speech difficulty, weakness, numbness and headaches.  Hematological: Negative for adenopathy. Does not bruise/bleed easily.  Psychiatric/Behavioral: Negative for behavioral problems and dysphoric mood. The patient is not nervous/anxious.        Objective:   Physical Exam    Constitutional: He is oriented to person, place, and time. He appears well-developed.  Blood pressure 140/80 in both arms  HENT:  Head: Normocephalic.  Right Ear: External ear normal.  Left Ear: External ear normal.  Eyes: Conjunctivae and EOM are normal.  Neck: Normal range of motion.  Carotid upstroke normal.  No bruits  Cardiovascular: Normal rate and normal heart sounds.   Pulmonary/Chest: Breath sounds normal.  Abdominal: Bowel sounds are normal.  Musculoskeletal: Normal range of motion. He exhibits no edema or tenderness.  Neurological: He is alert and oriented to person, place, and time.  Psychiatric: He has a normal mood and affect. His behavior is normal.          Assessment & Plan:   Probable left central retinal artery stroke.  Aggressive risk factor modification discussed including smoking cessation.  He be placed on a 1 mg of aspirin and also low intensity statin therapy.  Carotid artery ultrasound will be reviewed.  CPX as scheduled Hypertension Ongoing tobacco use

## 2015-07-15 NOTE — Patient Instructions (Signed)
Aspirin 81 mg daily  Smoking tobacco is very bad for your health. You should stop smoking immediately.  CPX as scheduled

## 2015-07-15 NOTE — Progress Notes (Signed)
Pre visit review using our clinic review tool, if applicable. No additional management support is needed unless otherwise documented below in the visit note. 

## 2015-07-20 ENCOUNTER — Other Ambulatory Visit: Payer: Self-pay | Admitting: Internal Medicine

## 2015-07-20 DIAGNOSIS — H539 Unspecified visual disturbance: Secondary | ICD-10-CM

## 2015-07-20 DIAGNOSIS — H3412 Central retinal artery occlusion, left eye: Secondary | ICD-10-CM

## 2015-07-21 ENCOUNTER — Ambulatory Visit (HOSPITAL_COMMUNITY)
Admission: RE | Admit: 2015-07-21 | Discharge: 2015-07-21 | Disposition: A | Discharge status: Home / Self Care | Payer: Medicare Other | Source: Ambulatory Visit | Attending: Cardiovascular Disease | Admitting: Cardiovascular Disease

## 2015-07-21 DIAGNOSIS — H3412 Central retinal artery occlusion, left eye: Secondary | ICD-10-CM | POA: Insufficient documentation

## 2015-07-21 DIAGNOSIS — I10 Essential (primary) hypertension: Secondary | ICD-10-CM | POA: Diagnosis not present

## 2015-07-21 DIAGNOSIS — I6523 Occlusion and stenosis of bilateral carotid arteries: Secondary | ICD-10-CM | POA: Insufficient documentation

## 2015-07-21 DIAGNOSIS — H539 Unspecified visual disturbance: Secondary | ICD-10-CM | POA: Insufficient documentation

## 2015-07-21 DIAGNOSIS — F17200 Nicotine dependence, unspecified, uncomplicated: Secondary | ICD-10-CM | POA: Insufficient documentation

## 2015-07-24 ENCOUNTER — Telehealth: Payer: Self-pay | Admitting: Internal Medicine

## 2015-07-24 NOTE — Telephone Encounter (Signed)
Spoke to pt, see result note.

## 2015-07-24 NOTE — Telephone Encounter (Signed)
Pt wife called , but pt does not live w/ his wife. He called her to see if she would find out why we called. However, advised Wife she is not on DPR and we would need to speak w/ pt.

## 2015-07-31 DIAGNOSIS — C61 Malignant neoplasm of prostate: Secondary | ICD-10-CM | POA: Diagnosis not present

## 2015-08-07 DIAGNOSIS — N5201 Erectile dysfunction due to arterial insufficiency: Secondary | ICD-10-CM | POA: Diagnosis not present

## 2015-08-07 DIAGNOSIS — R351 Nocturia: Secondary | ICD-10-CM | POA: Diagnosis not present

## 2015-08-07 DIAGNOSIS — Z8546 Personal history of malignant neoplasm of prostate: Secondary | ICD-10-CM | POA: Diagnosis not present

## 2015-11-19 ENCOUNTER — Encounter: Payer: Medicare Other | Admitting: Internal Medicine

## 2015-12-02 ENCOUNTER — Ambulatory Visit (INDEPENDENT_AMBULATORY_CARE_PROVIDER_SITE_OTHER): Payer: Medicare Other | Admitting: Internal Medicine

## 2015-12-02 ENCOUNTER — Other Ambulatory Visit: Payer: Self-pay | Admitting: *Deleted

## 2015-12-02 ENCOUNTER — Encounter: Payer: Self-pay | Admitting: Internal Medicine

## 2015-12-02 VITALS — BP 124/80 | HR 66 | Temp 98.7°F | Resp 20 | Ht 71.0 in | Wt 206.0 lb

## 2015-12-02 DIAGNOSIS — F17200 Nicotine dependence, unspecified, uncomplicated: Secondary | ICD-10-CM

## 2015-12-02 DIAGNOSIS — Z0000 Encounter for general adult medical examination without abnormal findings: Secondary | ICD-10-CM

## 2015-12-02 DIAGNOSIS — Z86010 Personal history of colonic polyps: Secondary | ICD-10-CM

## 2015-12-02 DIAGNOSIS — I10 Essential (primary) hypertension: Secondary | ICD-10-CM

## 2015-12-02 MED ORDER — POTASSIUM CHLORIDE CRYS ER 20 MEQ PO TBCR: 20.0000 meq | EXTENDED_RELEASE_TABLET | Freq: Every day | ORAL | Status: DC

## 2015-12-02 MED ORDER — METOPROLOL SUCCINATE ER 100 MG PO TB24: 100.0000 mg | ORAL_TABLET | Freq: Every day | ORAL | Status: DC

## 2015-12-02 MED ORDER — ATORVASTATIN CALCIUM 20 MG PO TABS: 20.0000 mg | ORAL_TABLET | Freq: Every day | ORAL | Status: DC

## 2015-12-02 MED ORDER — CHLORTHALIDONE 25 MG PO TABS: 25.0000 mg | ORAL_TABLET | Freq: Every day | ORAL | Status: DC

## 2015-12-02 MED ORDER — BENAZEPRIL HCL 40 MG PO TABS: 40.0000 mg | ORAL_TABLET | Freq: Every day | ORAL | Status: DC

## 2015-12-02 NOTE — Progress Notes (Signed)
Pre visit review using our clinic review tool, if applicable. No additional management support is needed unless otherwise documented below in the visit note. 

## 2015-12-02 NOTE — Patient Instructions (Signed)
Limit your sodium (Salt) intake    It is important that you exercise regularly, at least 20 minutes 3 to 4 times per week.  If you develop chest pain or shortness of breath seek  medical attention.  Smoking tobacco is very bad for your health. You should stop smoking immediately.  Return in 6 months for follow-up  

## 2015-12-02 NOTE — Progress Notes (Signed)
Patient ID: Tommy Bauer, male   DOB: [birth year], 75 y.o.   MRN: [id number]  Subjective:    Patient ID: Tommy Bauer, male    DOB: [birth year], 75 y.o.   MRN: [id number]  HPI Pre-visit discussion using our clinic review tool. No additional management support is needed unless otherwise documented below in the visit note.    CC: cpx- doing well.   History of Present Illness:   75   year-old patient who is seen today for a comprehensive evaluation.  Medical problems include a history of LV dysfunction. He has hypertension, colonic polyps, and a history of ongoing tobacco use. . denies any symptoms of congestive heart failure. No exertional chest pain.   He has  completed radiation treatment for extra capsular extension of prostate cancer three  years ago and is being followed by urology.  Over the past year.  He has increased his tobacco use to 1 half packs per day. In September 2016.  He was evaluated for a left central retinal artery occlusion.  He had carotid artery Doppler studies done at that time     Here for Medicare AWV:   1. Risk factors based on Past M, S, F history: patient has a history of LV dysfunction, hypertension, and tobacco use  2. Physical Activities: no limitations  3. Depression/mood: no history of depression or mood disorder  4. Hearing: no hearing deficits  5. ADL's: remains independent in all aspects of daily living  6. Fall Risk: low  7. Home Safety: no problems identified  8. Height, weight, &visual acuity:height and weight are stable. No difficulty with visual acuity  9. Counseling: total cessation of all tobacco products. Encouraged  10. Labs ordered based on risk factors: laboratory studies, including PSA will be reviewed  11. Referral Coordination-followed urology. This fall  12. Care Plan- heart healthy, salt restricted diet more regular exercise and modest weight loss. All encouraged  13. Cognitive Assessment- alert and oriented, with normal affect  14.   Preventive services will include annual health assessments.  Annual examinations.  Will be continued as well as annual or biannual urology follow-up.  Patient was provided with a written and personalized care plan 15.  Provider list update includes primary care medicine, ophthalmology and urology.  GI for colonoscopies every 5 years  Preventive Screening-Counseling & Management  Alcohol-Tobacco  Smoking Status: current  Smoking Cessation Counseling: yes   Allergies (verified):  No Known Drug Allergies   Past History:  Past Medical History:    Colonic polyps, hx of ; colonoscopy may 2013 Hypertension  ED  LVD  R inguinal hernia-repaired  prostate cancer status post prostatectomy and radiotherapy  Past Surgical History:   Colonoscopy October2007 May 2013 Cardiolite stress test September 2007  status post radical prostatectomy November 2010 (T1C NO MO)   Family History:   details of his father's health unknown.  Mother died age 50, cancer, unclear type  III sisters, one deceased, age 89 cancer, unclear type;  another died Ca-leukemia  one brother died-trauma and secondary infection   Social History:   Current Smoker  Married  one son  still works doing Chief of Staff      Review of Systems  Constitutional: Negative for fever, chills, activity change, appetite change and fatigue.  HENT: Negative for congestion, dental problem, ear pain, hearing loss, mouth sores, rhinorrhea, sinus pressure, sneezing, tinnitus, trouble swallowing and voice change.   Eyes: Negative for photophobia, pain, redness and visual disturbance.  Respiratory: Negative for apnea,  cough, choking, chest tightness, shortness of breath and wheezing.   Cardiovascular: Negative for chest pain, palpitations and leg swelling.  Gastrointestinal: Negative for nausea, vomiting, abdominal pain, diarrhea, constipation, blood in stool, abdominal distention, anal bleeding and rectal pain.  Genitourinary:  Negative for dysuria, urgency, frequency, hematuria, flank pain, decreased urine volume, discharge, penile swelling, scrotal swelling, difficulty urinating, genital sores and testicular pain.  Musculoskeletal: Negative for myalgias, back pain, joint swelling, arthralgias, gait problem, neck pain and neck stiffness.  Skin: Negative for color change, rash and wound.  Neurological: Negative for dizziness, tremors, seizures, syncope, facial asymmetry, speech difficulty, weakness, light-headedness, numbness and headaches.  Hematological: Negative for adenopathy. Does not bruise/bleed easily.  Psychiatric/Behavioral: Negative for suicidal ideas, hallucinations, behavioral problems, confusion, sleep disturbance, self-injury, dysphoric mood, decreased concentration and agitation. The patient is not nervous/anxious.        Objective:   Physical Exam  Constitutional: He appears well-developed and well-nourished. No distress.  HENT:  Head: Normocephalic and atraumatic.  Right Ear: External ear normal.  Left Ear: External ear normal.  Nose: Nose normal.  Mouth/Throat: Oropharynx is clear and moist.  Eyes: Conjunctivae and EOM are normal. Pupils are equal, round, and reactive to light. No scleral icterus.  Neck: Normal range of motion. Neck supple. No JVD present. No thyromegaly present.  Cardiovascular: Regular rhythm, normal heart sounds and intact distal pulses.  Exam reveals no gallop and no friction rub.   No murmur heard. Pedal pulses faint  Pulmonary/Chest: Effort normal and breath sounds normal. He exhibits no tenderness.  Abdominal: Soft. Bowel sounds are normal. He exhibits no distension and no mass. There is no tenderness.  Genitourinary: Penis normal. Guaiac negative stool.  Musculoskeletal: Normal range of motion. He exhibits no edema or tenderness.  Lymphadenopathy:    He has no cervical adenopathy.  Neurological: He is alert. He has normal reflexes. No cranial nerve deficit.  Coordination normal.  Skin: Skin is warm and dry. No rash noted.  Psychiatric: He has a normal mood and affect. His behavior is normal.          Assessment & Plan:    Preventive health Hypertension stable Left ventricular dysfunction. Asymptomatic History colonic polyps.  Follow-up colonoscopy 2018 History of prostate cancer.  Followup urology in 6 months Ongoing tobacco use.  Total smoking cessation encouraged History of left central retinal artery occlusion  We'll continue efforts at risk factor modification.  Blood pressure is well-controlled today.  Will continue statin therapy.  Laboratory studies reviewed

## 2016-01-11 DIAGNOSIS — H3412 Central retinal artery occlusion, left eye: Secondary | ICD-10-CM | POA: Diagnosis not present

## 2016-01-11 DIAGNOSIS — H35033 Hypertensive retinopathy, bilateral: Secondary | ICD-10-CM | POA: Diagnosis not present

## 2016-01-11 DIAGNOSIS — H2513 Age-related nuclear cataract, bilateral: Secondary | ICD-10-CM | POA: Diagnosis not present

## 2016-01-11 DIAGNOSIS — H31091 Other chorioretinal scars, right eye: Secondary | ICD-10-CM | POA: Diagnosis not present

## 2016-01-12 ENCOUNTER — Ambulatory Visit (INDEPENDENT_AMBULATORY_CARE_PROVIDER_SITE_OTHER): Payer: Medicare Other | Admitting: Adult Health

## 2016-01-12 ENCOUNTER — Encounter: Payer: Self-pay | Admitting: Adult Health

## 2016-01-12 VITALS — BP 104/60 | HR 102 | Temp 98.4°F | Ht 71.0 in | Wt 209.8 lb

## 2016-01-12 DIAGNOSIS — J069 Acute upper respiratory infection, unspecified: Secondary | ICD-10-CM | POA: Diagnosis not present

## 2016-01-12 MED ORDER — IPRATROPIUM-ALBUTEROL 0.5-2.5 (3) MG/3ML IN SOLN
3.0000 mL | Freq: Once | RESPIRATORY_TRACT | Status: AC
  Administered 2016-01-12: 3 mL via RESPIRATORY_TRACT

## 2016-01-12 MED ORDER — PREDNISONE 10 MG PO TABS: 10.0000 mg | ORAL_TABLET | Freq: Every day | ORAL | Status: DC

## 2016-01-12 MED ORDER — DOXYCYCLINE HYCLATE 100 MG PO CAPS: 100.0000 mg | ORAL_CAPSULE | Freq: Two times a day (BID) | ORAL | Status: DC

## 2016-01-12 NOTE — Progress Notes (Signed)
Subjective:    Patient ID: Tommy Bauer, male    DOB: [birth year], 75 y.o.   MRN: [id number]  HPI  75 year old male,who presents to the office today for an acute complaint. He complains of two days of non productive cough and feeling "off balance". He reports that when he stands up from sitting position he feels off balance, denies dizziness and lightheadedness. Has not had a syncopal episode.   Review of Systems  Constitutional: Positive for activity change and fatigue. Negative for fever, chills and diaphoresis.  HENT: Negative.   Respiratory: Positive for cough and wheezing. Negative for shortness of breath.   Cardiovascular: Negative.   Musculoskeletal: Negative.   Neurological: Positive for weakness. Negative for dizziness and light-headedness.  Hematological: Negative.    Past Medical History  Diagnosis Date  . BACK PAIN 07/16/2008  . Carpal tunnel syndrome 10/15/2008  . COLONIC POLYPS, HX OF 06/08/2007  . ERECTILE DYSFUNCTION, ORGANIC 07/11/2007  . Headache(784.0) 07/11/2007  . HYPERTENSION 06/08/2007  . INGUINAL HERNIA 12/04/2007  . LEFT VENTRICULAR FUNCTION, DECREASED 11/12/2007  . PROSTATE SPECIFIC ANTIGEN, ELEVATED 05/06/2009  . TOBACCO ABUSE 11/06/2009  . ADENOCARCINOMA, PROSTATE 05/19/2010  . Prostate cancer ([place]) 09/14/2009    prostateectomy,adenocarcinoma,gleason=4+3=7  . Contusion     left shoulder,left knee,s/p recent fall end of march 2013    Social History   Social History  . Marital Status: Married    Spouse Name: N/A  . Number of Children: N/A  . Years of Education: N/A   Occupational History  .      retired Games developer   Social History Main Topics  . Smoking status: Current Every Day Smoker -- 0.50 packs/day for 60 years    Types: Cigarettes  . Smokeless tobacco: Never Used  . Alcohol Use: Yes     Comment: occasionally  . Drug Use: No  . Sexual Activity: Not on file   Other Topics Concern  . Not on file   Social History Narrative    Past Surgical  History  Procedure Laterality Date  . Hernia repair      right ingunial  . Prostate surgery  09/14/2009    radical prostatectomy /gleason=4+3-7    Family History  Problem Relation Age of Onset  . Colon cancer Neg Hx     No Known Allergies  Current Outpatient Prescriptions on File Prior to Visit  Medication Sig Dispense Refill  . acetaminophen (TYLENOL) 500 MG tablet Take 500 mg by mouth every 4 (four) hours as needed.    Marland Kitchen aspirin 81 MG tablet Take 1 tablet (81 mg total) by mouth daily. 30 tablet   . atorvastatin (LIPITOR) 20 MG tablet Take 1 tablet (20 mg total) by mouth daily. 90 tablet 3  . benazepril (LOTENSIN) 40 MG tablet Take 1 tablet (40 mg total) by mouth daily. 90 tablet 3  . chlorthalidone (HYGROTON) 25 MG tablet Take 1 tablet (25 mg total) by mouth daily. 90 tablet 3  . metoprolol succinate (TOPROL-XL) 100 MG 24 hr tablet Take 1 tablet (100 mg total) by mouth daily. Take with or immediately following a meal. 90 tablet 3  . potassium chloride SA (K-DUR,KLOR-CON) 20 MEQ tablet Take 1 tablet (20 mEq total) by mouth daily. 90 tablet 3   No current facility-administered medications on file prior to visit.    BP 104/60 mmHg  Pulse 102  Temp(Src) 98.4 F (36.9 C) (Oral)  Ht 5\' 11"  (1.803 m)  Wt 209 lb 12.8 oz (95.165 kg)  BMI 29.27 kg/m2  SpO2 92%       Objective:   Physical Exam  Constitutional: He is oriented to person, place, and time. He appears well-developed and well-nourished. No distress.  HENT:  Head: Normocephalic and atraumatic.  Right Ear: External ear normal.  Left Ear: External ear normal.  Nose: Nose normal.  Mouth/Throat: Oropharynx is clear and moist. No oropharyngeal exudate.  Eyes: Conjunctivae and EOM are normal. Pupils are equal, round, and reactive to light. Right eye exhibits no discharge. Left eye exhibits no discharge.  Neck: Normal range of motion. Neck supple. No thyromegaly present.  Cardiovascular: Normal rate, regular rhythm,  normal heart sounds and intact distal pulses.  Exam reveals no gallop.   No murmur heard. Pulmonary/Chest: Effort normal. No respiratory distress. He has wheezes in the right upper field, the right middle field, the right lower field, the left upper field, the left middle field and the left lower field. He has no rhonchi. He has no rales. He exhibits no tenderness.  Lymphadenopathy:    He has no cervical adenopathy.  Neurological: He is alert and oriented to person, place, and time.  Skin: Skin is warm and dry. No rash noted. He is not diaphoretic. No erythema. No pallor.  Psychiatric: He has a normal mood and affect. His behavior is normal. Thought content normal.  Nursing note and vitals reviewed.     Assessment & Plan:  1. URI (upper respiratory infection) - doxycycline (VIBRAMYCIN) 100 MG capsule; Take 1 capsule (100 mg total) by mouth 2 (two) times daily.  Dispense: 20 capsule; Refill: 0 - predniSONE (DELTASONE) 10 MG tablet; Take 1 tablet (10 mg total) by mouth daily with breakfast. 40 mgx 3 days, 20 mgx 3 days, 10 mg x 3 day  Dispense: 21 tablet; Refill: 0 - Stay well hydrated.  - ipratropium-albuterol (DUONEB) 0.5-2.5 (3) MG/3ML nebulizer solution 3 mL; Take 3 mLs by nebulization once. - Endorsed being able to breath easier after nebulizer

## 2016-01-12 NOTE — Patient Instructions (Addendum)
It was great meeting you today!  I have sent in a prescription for prednisone (steroid) and Doxycycline ( antibiotic). Please take these as directed.   For the prednisone 40 mg x 3 days 20 mg x 3 days 10 mg x 3 days.   Make sure you are drinking plenty of fluids.   Use Mucinex during the day.   Follow up with Dr. Raliegh Ip if no improvement.   Upper Respiratory Infection, Adult Most upper respiratory infections (URIs) are a viral infection of the air passages leading to the lungs. A URI affects the nose, throat, and upper air passages. The most common type of URI is nasopharyngitis and is typically referred to as "the common cold." URIs run their course and usually go away on their own. Most of the time, a URI does not require medical attention, but sometimes a bacterial infection in the upper airways can follow a viral infection. This is called a secondary infection. Sinus and middle ear infections are common types of secondary upper respiratory infections. Bacterial pneumonia can also complicate a URI. A URI can worsen asthma and chronic obstructive pulmonary disease (COPD). Sometimes, these complications can require emergency medical care and may be life threatening.  CAUSES Almost all URIs are caused by viruses. A virus is a type of germ and can spread from one person to another.  RISKS FACTORS You may be at risk for a URI if:   You smoke.   You have chronic heart or lung disease.  You have a weakened defense (immune) system.   You are very young or very old.   You have nasal allergies or asthma.  You work in crowded or poorly ventilated areas.  You work in health care facilities or schools. SIGNS AND SYMPTOMS  Symptoms typically develop 2-3 days after you come in contact with a cold virus. Most viral URIs last 7-10 days. However, viral URIs from the influenza virus (flu virus) can last 14-18 days and are typically more severe. Symptoms may include:   Runny or stuffy  (congested) nose.   Sneezing.   Cough.   Sore throat.   Headache.   Fatigue.   Fever.   Loss of appetite.   Pain in your forehead, behind your eyes, and over your cheekbones (sinus pain).  Muscle aches.  DIAGNOSIS  Your health care provider may diagnose a URI by:  Physical exam.  Tests to check that your symptoms are not due to another condition such as:  Strep throat.  Sinusitis.  Pneumonia.  Asthma. TREATMENT  A URI goes away on its own with time. It cannot be cured with medicines, but medicines may be prescribed or recommended to relieve symptoms. Medicines may help:  Reduce your fever.  Reduce your cough.  Relieve nasal congestion. HOME CARE INSTRUCTIONS   Take medicines only as directed by your health care provider.   Gargle warm saltwater or take cough drops to comfort your throat as directed by your health care provider.  Use a warm mist humidifier or inhale steam from a shower to increase air moisture. This may make it easier to breathe.  Drink enough fluid to keep your urine clear or pale yellow.   Eat soups and other clear broths and maintain good nutrition.   Rest as needed.   Return to work when your temperature has returned to normal or as your health care provider advises. You may need to stay home longer to avoid infecting others. You can also use a face mask  and careful hand washing to prevent spread of the virus.  Increase the usage of your inhaler if you have asthma.   Do not use any tobacco products, including cigarettes, chewing tobacco, or electronic cigarettes. If you need help quitting, ask your health care provider. PREVENTION  The best way to protect yourself from getting a cold is to practice good hygiene.   Avoid oral or hand contact with people with cold symptoms.   Wash your hands often if contact occurs.  There is no clear evidence that vitamin C, vitamin E, echinacea, or exercise reduces the chance of  developing a cold. However, it is always recommended to get plenty of rest, exercise, and practice good nutrition.  SEEK MEDICAL CARE IF:   You are getting worse rather than better.   Your symptoms are not controlled by medicine.   You have chills.  You have worsening shortness of breath.  You have brown or red mucus.  You have yellow or brown nasal discharge.  You have pain in your face, especially when you bend forward.  You have a fever.  You have swollen neck glands.  You have pain while swallowing.  You have white areas in the back of your throat. SEEK IMMEDIATE MEDICAL CARE IF:   You have severe or persistent:  Headache.  Ear pain.  Sinus pain.  Chest pain.  You have chronic lung disease and any of the following:  Wheezing.  Prolonged cough.  Coughing up blood.  A change in your usual mucus.  You have a stiff neck.  You have changes in your:  Vision.  Hearing.  Thinking.  Mood. MAKE SURE YOU:   Understand these instructions.  Will watch your condition.  Will get help right away if you are not doing well or get worse.   This information is not intended to replace advice given to you by your health care provider. Make sure you discuss any questions you have with your health care provider.   Document Released: 04/12/2001 Document Revised: 03/03/2015 Document Reviewed: 01/22/2014 Elsevier Interactive Patient Education [employer].

## 2016-01-12 NOTE — Progress Notes (Signed)
Pre visit review using our clinic review tool, if applicable. No additional management support is needed unless otherwise documented below in the visit note. 

## 2016-04-05 ENCOUNTER — Encounter: Payer: Self-pay | Admitting: Internal Medicine

## 2016-04-05 ENCOUNTER — Ambulatory Visit (INDEPENDENT_AMBULATORY_CARE_PROVIDER_SITE_OTHER): Payer: Medicare Other | Admitting: Internal Medicine

## 2016-04-05 VITALS — BP 120/70 | HR 61 | Temp 98.7°F | Resp 20 | Ht 71.0 in | Wt 205.0 lb

## 2016-04-05 DIAGNOSIS — R972 Elevated prostate specific antigen [PSA]: Secondary | ICD-10-CM

## 2016-04-05 DIAGNOSIS — I10 Essential (primary) hypertension: Secondary | ICD-10-CM | POA: Diagnosis not present

## 2016-04-05 DIAGNOSIS — F17200 Nicotine dependence, unspecified, uncomplicated: Secondary | ICD-10-CM | POA: Diagnosis not present

## 2016-04-05 NOTE — Patient Instructions (Signed)
Limit your sodium (Salt) intake  Smoking tobacco is very bad for your health. You should stop smoking immediately.  Please check your blood pressure on a regular basis.  If it is consistently greater than 150/90, please make an office appointment.  

## 2016-04-05 NOTE — Progress Notes (Signed)
Subjective:    Patient ID: Tommy Bauer, male    DOB: 09/12/1941, 75 y.o.   MRN: [id number]  HPI  75 year old patient who has essential hypertension.  He has a history of CVD and ongoing tobacco use.  Doing fairly well.  His only complaint, include some occasional cramping of his hands.  He also has some chronic right leg discomfort. He still works Animator occasionally for extra cash No cardiopulmonary complaints  Past Medical History  Diagnosis Date  . BACK PAIN 07/16/2008  . Carpal tunnel syndrome 10/15/2008  . COLONIC POLYPS, HX OF 06/08/2007  . ERECTILE DYSFUNCTION, ORGANIC 07/11/2007  . Headache(784.0) 07/11/2007  . HYPERTENSION 06/08/2007  . INGUINAL HERNIA 12/04/2007  . LEFT VENTRICULAR FUNCTION, DECREASED 11/12/2007  . PROSTATE SPECIFIC ANTIGEN, ELEVATED 05/06/2009  . TOBACCO ABUSE 11/06/2009  . ADENOCARCINOMA, PROSTATE 05/19/2010  . Prostate cancer ([place]) 09/14/2009    prostateectomy,adenocarcinoma,gleason=4+3=7  . Contusion     left shoulder,left knee,s/p recent fall end of march 2013     Social History   Social History  . Marital Status: Married    Spouse Name: N/A  . Number of Children: N/A  . Years of Education: N/A   Occupational History  .      retired Games developer   Social History Main Topics  . Smoking status: Current Every Day Smoker -- 0.50 packs/day for 60 years    Types: Cigarettes  . Smokeless tobacco: Never Used  . Alcohol Use: Yes     Comment: occasionally  . Drug Use: No  . Sexual Activity: Not on file   Other Topics Concern  . Not on file   Social History Narrative    Past Surgical History  Procedure Laterality Date  . Hernia repair      right ingunial  . Prostate surgery  09/14/2009    radical prostatectomy /gleason=4+3-7    Family History  Problem Relation Age of Onset  . Colon cancer Neg Hx     No Known Allergies  Current Outpatient Prescriptions on File Prior to Visit  Medication Sig Dispense Refill  . acetaminophen (TYLENOL) 500  MG tablet Take 500 mg by mouth every 4 (four) hours as needed.    Marland Kitchen aspirin 81 MG tablet Take 1 tablet (81 mg total) by mouth daily. 30 tablet   . atorvastatin (LIPITOR) 20 MG tablet Take 1 tablet (20 mg total) by mouth daily. 90 tablet 3  . benazepril (LOTENSIN) 40 MG tablet Take 1 tablet (40 mg total) by mouth daily. 90 tablet 3  . chlorthalidone (HYGROTON) 25 MG tablet Take 1 tablet (25 mg total) by mouth daily. 90 tablet 3  . metoprolol succinate (TOPROL-XL) 100 MG 24 hr tablet Take 1 tablet (100 mg total) by mouth daily. Take with or immediately following a meal. 90 tablet 3  . potassium chloride SA (K-DUR,KLOR-CON) 20 MEQ tablet Take 1 tablet (20 mEq total) by mouth daily. 90 tablet 3   No current facility-administered medications on file prior to visit.    BP 120/70 mmHg  Pulse 61  Temp(Src) 98.7 F (37.1 C) (Oral)  Resp 20  Ht 5\' 11"  (1.803 m)  Wt 205 lb (92.987 kg)  BMI 28.60 kg/m2  SpO2 98%      Review of Systems  Constitutional: Negative for fever, chills, appetite change and fatigue.  HENT: Negative for congestion, dental problem, ear pain, hearing loss, sore throat, tinnitus, trouble swallowing and voice change.   Eyes: Negative for pain, discharge and visual disturbance.  Respiratory: Negative for cough, chest tightness, wheezing and stridor.   Cardiovascular: Negative for chest pain, palpitations and leg swelling.  Gastrointestinal: Negative for nausea, vomiting, abdominal pain, diarrhea, constipation, blood in stool and abdominal distention.  Genitourinary: Negative for urgency, hematuria, flank pain, discharge, difficulty urinating and genital sores.  Musculoskeletal: Positive for arthralgias. Negative for myalgias, back pain, joint swelling, gait problem and neck stiffness.  Skin: Negative for rash.  Neurological: Negative for dizziness, syncope, speech difficulty, weakness, numbness and headaches.  Hematological: Negative for adenopathy. Does not bruise/bleed  easily.  Psychiatric/Behavioral: Negative for behavioral problems and dysphoric mood. The patient is not nervous/anxious.        Objective:   Physical Exam  Constitutional: He is oriented to person, place, and time. He appears well-developed.  Blood pressure low normal  HENT:  Head: Normocephalic.  Right Ear: External ear normal.  Left Ear: External ear normal.  Eyes: Conjunctivae and EOM are normal.  Neck: Normal range of motion.  Cardiovascular: Normal rate and normal heart sounds.   Pulmonary/Chest: Breath sounds normal.  Abdominal: Bowel sounds are normal.  Musculoskeletal: Normal range of motion. He exhibits no edema or tenderness.  Neurological: He is alert and oriented to person, place, and time.  Psychiatric: He has a normal mood and affect. His behavior is normal.          Assessment & Plan:  Hypertension, stable Dyslipidemia.  Continue statin therapy Ongoing tobacco use.  Total smoking cessation encouraged  Annual CPX as scheduled  Nyoka Cowden, MD

## 2016-05-31 ENCOUNTER — Ambulatory Visit: Payer: Medicare Other | Admitting: Internal Medicine

## 2016-07-18 DIAGNOSIS — H31091 Other chorioretinal scars, right eye: Secondary | ICD-10-CM | POA: Diagnosis not present

## 2016-07-18 DIAGNOSIS — H3412 Central retinal artery occlusion, left eye: Secondary | ICD-10-CM | POA: Diagnosis not present

## 2016-07-18 DIAGNOSIS — H35033 Hypertensive retinopathy, bilateral: Secondary | ICD-10-CM | POA: Diagnosis not present

## 2016-07-18 DIAGNOSIS — H2513 Age-related nuclear cataract, bilateral: Secondary | ICD-10-CM | POA: Diagnosis not present

## 2016-08-03 DIAGNOSIS — C61 Malignant neoplasm of prostate: Secondary | ICD-10-CM | POA: Diagnosis not present

## 2016-08-10 DIAGNOSIS — Z8546 Personal history of malignant neoplasm of prostate: Secondary | ICD-10-CM | POA: Diagnosis not present

## 2016-08-10 DIAGNOSIS — N5201 Erectile dysfunction due to arterial insufficiency: Secondary | ICD-10-CM | POA: Diagnosis not present

## 2016-10-05 ENCOUNTER — Encounter: Payer: Self-pay | Admitting: Adult Health

## 2016-10-05 ENCOUNTER — Ambulatory Visit (INDEPENDENT_AMBULATORY_CARE_PROVIDER_SITE_OTHER): Payer: Medicare Other | Admitting: Adult Health

## 2016-10-05 VITALS — BP 150/74 | Temp 98.3°F | Ht 71.0 in | Wt 202.2 lb

## 2016-10-05 DIAGNOSIS — B351 Tinea unguium: Secondary | ICD-10-CM | POA: Diagnosis not present

## 2016-10-05 DIAGNOSIS — M7989 Other specified soft tissue disorders: Secondary | ICD-10-CM | POA: Diagnosis not present

## 2016-10-05 NOTE — Progress Notes (Signed)
Subjective:    Patient ID: Tommy Bauer, male    DOB: 20-Mar-1941, 75 y.o.   MRN: [id number]  HPI  75 year old male who  has a past medical history of ADENOCARCINOMA, PROSTATE (05/19/2010); BACK PAIN (07/16/2008); Carpal tunnel syndrome (10/15/2008); COLONIC POLYPS, HX OF (06/08/2007); Contusion; ERECTILE DYSFUNCTION, ORGANIC (07/11/2007); [id number]) (07/11/2007); HYPERTENSION (06/08/2007); INGUINAL HERNIA (12/04/2007); LEFT VENTRICULAR FUNCTION, DECREASED (11/12/2007); Prostate cancer ([place]) (09/14/2009); PROSTATE SPECIFIC ANTIGEN, ELEVATED (05/06/2009); and TOBACCO ABUSE (11/06/2009). He is a patient of Dr. Raliegh Ip, who I am seeing today for chronic swelling of his right leg. He reports that when the weather changes his is feet/legs swelling. He denies any pain or difficulty walking. He does have chronic knee pain.   He would also like a referral to podiatry due to think long toe nails  Review of Systems  Constitutional: Negative.   Respiratory: Negative.   Cardiovascular: Positive for leg swelling.  Musculoskeletal: Positive for arthralgias. Negative for gait problem and joint swelling.  Neurological: Negative.   All other systems reviewed and are negative.  Past Medical History:  Diagnosis Date  . ADENOCARCINOMA, PROSTATE 05/19/2010  . BACK PAIN 07/16/2008  . Carpal tunnel syndrome 10/15/2008  . COLONIC POLYPS, HX OF 06/08/2007  . Contusion    left shoulder,left knee,s/p recent fall end of march 2013  . ERECTILE DYSFUNCTION, ORGANIC 07/11/2007  . Headache(784.0) 07/11/2007  . HYPERTENSION 06/08/2007  . INGUINAL HERNIA 12/04/2007  . LEFT VENTRICULAR FUNCTION, DECREASED 11/12/2007  . Prostate cancer ([place]) 09/14/2009   prostateectomy,adenocarcinoma,gleason=4+3=7  . PROSTATE SPECIFIC ANTIGEN, ELEVATED 05/06/2009  . TOBACCO ABUSE 11/06/2009    Social History   Social History  . Marital status: Married    Spouse name: N/A  . Number of children: N/A  . Years of education: N/A   Occupational History  .   Retired    retired Games developer   Social History Main Topics  . Smoking status: Current Every Day Smoker    Packs/day: 0.50    Years: 60.00    Types: Cigarettes  . Smokeless tobacco: Never Used  . Alcohol use Yes     Comment: occasionally  . Drug use: No  . Sexual activity: Not on file   Other Topics Concern  . Not on file   Social History Narrative  . No narrative on file    Past Surgical History:  Procedure Laterality Date  . HERNIA REPAIR     right ingunial  . PROSTATE SURGERY  09/14/2009   radical prostatectomy /gleason=4+3-7    Family History  Problem Relation Age of Onset  . Colon cancer Neg Hx     No Known Allergies  Current Outpatient Prescriptions on File Prior to Visit  Medication Sig Dispense Refill  . acetaminophen (TYLENOL) 500 MG tablet Take 500 mg by mouth every 4 (four) hours as needed.    Marland Kitchen aspirin 81 MG tablet Take 1 tablet (81 mg total) by mouth daily. 30 tablet   . atorvastatin (LIPITOR) 20 MG tablet Take 1 tablet (20 mg total) by mouth daily. 90 tablet 3  . benazepril (LOTENSIN) 40 MG tablet Take 1 tablet (40 mg total) by mouth daily. 90 tablet 3  . chlorthalidone (HYGROTON) 25 MG tablet Take 1 tablet (25 mg total) by mouth daily. 90 tablet 3  . metoprolol succinate (TOPROL-XL) 100 MG 24 hr tablet Take 1 tablet (100 mg total) by mouth daily. Take with or immediately following a meal. 90 tablet 3  . potassium chloride SA (K-DUR,KLOR-CON) 20 MEQ  tablet Take 1 tablet (20 mEq total) by mouth daily. 90 tablet 3   No current facility-administered medications on file prior to visit.     BP (!) 150/74   Temp 98.3 F (36.8 C) (Oral)   Ht 5\' 11"  (1.803 m)   Wt 202 lb 3.2 oz (91.7 kg)   BMI 28.20 kg/m       Objective:   Physical Exam  Constitutional: He is oriented to person, place, and time. He appears well-developed and well-nourished. No distress.  Cardiovascular: Normal rate, regular rhythm, normal heart sounds and intact distal pulses.  Exam  reveals no gallop and no friction rub.   No murmur heard. Pulmonary/Chest: Effort normal and breath sounds normal. No respiratory distress. He has no wheezes. He has no rales. He exhibits no tenderness.  Musculoskeletal: He exhibits edema.  Trace non pitting edema in bilateral legs  Neurological: He is alert and oriented to person, place, and time.  Skin: Skin is warm and dry. No rash noted. He is not diaphoretic. No erythema. No pallor.  Thick long toenails with significant fungal infection noted on all toenails.   Psychiatric: He has a normal mood and affect. His behavior is normal. Judgment and thought content normal.  Nursing note and vitals reviewed.     Assessment & Plan:  1. Localized swelling of lower extremity - Advised that with his cardiac history that I would expect some degree of swelling. There is no concern for DVT or cellulitis. Advised to cut back on sodium and elevate legs above heart when he is resting. Can also use compression socks.  - Follow up as needed  2. Toenail fungus - Ambulatory referral to Podiatry  [name], NP

## 2016-10-05 NOTE — Patient Instructions (Signed)
It was great seeing you today!  Please elevate your legs at night above your heart. This will help with the swelling.   Someone from podiatry will call you to schedule your appointment for the toenails.

## 2016-11-07 ENCOUNTER — Ambulatory Visit: Payer: Medicare Other | Admitting: Podiatry

## 2017-01-03 ENCOUNTER — Ambulatory Visit (INDEPENDENT_AMBULATORY_CARE_PROVIDER_SITE_OTHER): Payer: Medicare Other | Admitting: Internal Medicine

## 2017-01-03 ENCOUNTER — Encounter: Payer: Self-pay | Admitting: Internal Medicine

## 2017-01-03 VITALS — BP 142/64 | HR 69 | Temp 98.9°F | Ht 71.5 in | Wt 194.8 lb

## 2017-01-03 DIAGNOSIS — I2510 Atherosclerotic heart disease of native coronary artery without angina pectoris: Secondary | ICD-10-CM | POA: Diagnosis not present

## 2017-01-03 DIAGNOSIS — F17200 Nicotine dependence, unspecified, uncomplicated: Secondary | ICD-10-CM | POA: Diagnosis not present

## 2017-01-03 DIAGNOSIS — I10 Essential (primary) hypertension: Secondary | ICD-10-CM

## 2017-01-03 DIAGNOSIS — Z86010 Personal history of colonic polyps: Secondary | ICD-10-CM

## 2017-01-03 NOTE — Patient Instructions (Signed)
Limit your sodium (Salt) intake    It is important that you exercise regularly, at least 20 minutes 3 to 4 times per week.  If you develop chest pain or shortness of breath seek  medical attention.  You need to lose weight.  Consider a lower calorie diet and regular exercise.  Smoking tobacco is very bad for your health. You should stop smoking immediately. 

## 2017-01-03 NOTE — Progress Notes (Signed)
Subjective:    Patient ID: Tommy Bauer, male    DOB: 1940-11-16, 76 y.o.   MRN: [id number]  HPI  76 year old patient who is seen today for a wellness exam and subsequent Medicare wellness visit  He is doing well today without concerns or complaints.  He states he walks 3 miles daily and continues to work.  No cardiopulmonary complaints.  He has a history of elevated PSA and is followed by urology.  He is also followed semiannually by ophthalmology.  He complains of some decreased visual acuity from the left eye.  He does have a history of a central retinal artery occlusion  He has a history colonic polyps.  Last colonoscopy 2013  Past Medical History:  Diagnosis Date  . ADENOCARCINOMA, PROSTATE 05/19/2010  . BACK PAIN 07/16/2008  . Carpal tunnel syndrome 10/15/2008  . COLONIC POLYPS, HX OF 06/08/2007  . Contusion    left shoulder,left knee,s/p recent fall end of march 2013  . ERECTILE DYSFUNCTION, ORGANIC 07/11/2007  . Headache(784.0) 07/11/2007  . HYPERTENSION 06/08/2007  . INGUINAL HERNIA 12/04/2007  . LEFT VENTRICULAR FUNCTION, DECREASED 11/12/2007  . Prostate cancer ([place]) 09/14/2009   prostateectomy,adenocarcinoma,gleason=4+3=7  . PROSTATE SPECIFIC ANTIGEN, ELEVATED 05/06/2009  . TOBACCO ABUSE 11/06/2009     Social History   Social History  . Marital status: Married    Spouse name: N/A  . Number of children: N/A  . Years of education: N/A   Occupational History  .  Retired    retired Games developer   Social History Main Topics  . Smoking status: Current Every Day Smoker    Packs/day: 0.50    Years: 60.00    Types: Cigarettes  . Smokeless tobacco: Never Used  . Alcohol use Yes     Comment: occasionally  . Drug use: No  . Sexual activity: Not on file   Other Topics Concern  . Not on file   Social History Narrative  . No narrative on file    Past Surgical History:  Procedure Laterality Date  . HERNIA REPAIR     right ingunial  . PROSTATE SURGERY  09/14/2009   radical prostatectomy /gleason=4+3-7    Family History  Problem Relation Age of Onset  . Colon cancer Neg Hx     No Known Allergies  Current Outpatient Prescriptions on File Prior to Visit  Medication Sig Dispense Refill  . acetaminophen (TYLENOL) 500 MG tablet Take 500 mg by mouth every 4 (four) hours as needed.    Marland Kitchen aspirin 81 MG tablet Take 1 tablet (81 mg total) by mouth daily. 30 tablet   . atorvastatin (LIPITOR) 20 MG tablet Take 1 tablet (20 mg total) by mouth daily. 90 tablet 3  . benazepril (LOTENSIN) 40 MG tablet Take 1 tablet (40 mg total) by mouth daily. 90 tablet 3  . chlorthalidone (HYGROTON) 25 MG tablet Take 1 tablet (25 mg total) by mouth daily. 90 tablet 3  . metoprolol succinate (TOPROL-XL) 100 MG 24 hr tablet Take 1 tablet (100 mg total) by mouth daily. Take with or immediately following a meal. 90 tablet 3  . potassium chloride SA (K-DUR,KLOR-CON) 20 MEQ tablet Take 1 tablet (20 mEq total) by mouth daily. 90 tablet 3   No current facility-administered medications on file prior to visit.     BP (!) 142/64 (BP Location: Left Arm, Patient Position: Sitting, Cuff Size: Normal)   Pulse 69   Temp 98.9 F (37.2 C) (Oral)   Ht 5' 11.5" (1.816 m)  Wt 194 lb 12.8 oz (88.4 kg)   SpO2 98%   BMI 26.79 kg/m   Medicare wellness visit  1. Risk factors, based on past  M,S,F history.cardiovascular risk factors include history of essential hypertension, ongoing tobacco use.  He also has a history of cerebrovascular disease  2.  Physical activities:remains quite active.  He states he walks 3 miles daily 3.  Depression/mood:no history of major depression or mood disorder  4.  Hearing:no significant deficits  5.  ADL's:independent  6.  Fall risk:low  7.  Home safety:no problems identified  8.  Height weight, and visual acuity;height and weight stable no change in visual acuity.  History of central retinal artery occlusion  9.  Counseling:heart healthy diet.  Total  smoking cessation encouraged  10. Lab orders based on risk factors:laboratory profile reviewed  11. Referral :follow-up ophthalmology  12. Care plan:continue efforts at aggressive risk factor modification  13. Cognitive assessment: alert in order with normal affect no cognitive dysfunction  14. Screening: Patient provided with a written and personalized 5-10 year screening schedule in the AVS.    15. Provider List Update: primary care ophthalmology and urology    Review of Systems  Constitutional: Negative for appetite change, chills, fatigue and fever.  HENT: Negative for congestion, dental problem, ear pain, hearing loss, sore throat, tinnitus, trouble swallowing and voice change.   Eyes: Negative for pain, discharge and visual disturbance.  Respiratory: Negative for cough, chest tightness, wheezing and stridor.   Cardiovascular: Negative for chest pain, palpitations and leg swelling.  Gastrointestinal: Negative for abdominal distention, abdominal pain, blood in stool, constipation, diarrhea, nausea and vomiting.  Genitourinary: Negative for difficulty urinating, discharge, flank pain, genital sores, hematuria and urgency.  Musculoskeletal: Negative for arthralgias, back pain, gait problem, joint swelling, myalgias and neck stiffness.  Skin: Negative for rash.  Neurological: Negative for dizziness, syncope, speech difficulty, weakness, numbness and headaches.  Hematological: Negative for adenopathy. Does not bruise/bleed easily.  Psychiatric/Behavioral: Negative for behavioral problems and dysphoric mood. The patient is not nervous/anxious.        Objective:   Physical Exam  Constitutional: He appears well-developed and well-nourished.  HENT:  Head: Normocephalic and atraumatic.  Right Ear: External ear normal.  Left Ear: External ear normal.  Nose: Nose normal.  Mouth/Throat: Oropharynx is clear and moist.  Eyes: Conjunctivae and EOM are normal. Pupils are equal, round,  and reactive to light. No scleral icterus.  Arcus senilis  Neck: Normal range of motion. Neck supple. No JVD present. No thyromegaly present.  Cardiovascular: Regular rhythm and normal heart sounds.  Exam reveals no gallop and no friction rub.   No murmur heard. Pedal pulses not easily palpable  Pulmonary/Chest: Effort normal and breath sounds normal. He exhibits no tenderness.  Abdominal: Soft. Bowel sounds are normal. He exhibits no distension and no mass. There is no tenderness.  Genitourinary: Prostate normal and penis normal.  Musculoskeletal: Normal range of motion. He exhibits no edema or tenderness.  Lymphadenopathy:    He has no cervical adenopathy.  Neurological: He is alert. He has normal reflexes. No cranial nerve deficit. Coordination normal.  Skin: Skin is warm and dry. No rash noted.  Psychiatric: He has a normal mood and affect. His behavior is normal.          Assessment & Plan:   Essential hypertension, controlled Ongoing tobacco use.  Total smoking cessation encouraged Dyslipidemia.  Continue statin therapy Preventive health.  Colonoscopy discussed patient declines.  Stool for occult  blood negative.  Today.  Will review CBC Medicare wellness visit  Follow-up 6 months Laboratory studies reviewed  Nyoka Cowden

## 2017-01-03 NOTE — Progress Notes (Signed)
Pre visit review using our clinic review tool, if applicable. No additional management support is needed unless otherwise documented below in the visit note. 

## 2017-01-25 ENCOUNTER — Encounter: Payer: Self-pay | Admitting: Gastroenterology

## 2017-03-02 ENCOUNTER — Other Ambulatory Visit: Payer: Self-pay | Admitting: Internal Medicine

## 2017-03-10 ENCOUNTER — Ambulatory Visit (INDEPENDENT_AMBULATORY_CARE_PROVIDER_SITE_OTHER): Payer: Medicare Other | Admitting: Physician Assistant

## 2017-03-10 ENCOUNTER — Other Ambulatory Visit (INDEPENDENT_AMBULATORY_CARE_PROVIDER_SITE_OTHER): Payer: Medicare Other

## 2017-03-10 ENCOUNTER — Encounter: Payer: Self-pay | Admitting: Physician Assistant

## 2017-03-10 VITALS — BP 120/66 | HR 80 | Ht 71.5 in | Wt 193.0 lb

## 2017-03-10 DIAGNOSIS — Z86010 Personal history of colonic polyps: Secondary | ICD-10-CM

## 2017-03-10 DIAGNOSIS — R1032 Left lower quadrant pain: Secondary | ICD-10-CM

## 2017-03-10 DIAGNOSIS — Z1211 Encounter for screening for malignant neoplasm of colon: Secondary | ICD-10-CM | POA: Diagnosis not present

## 2017-03-10 DIAGNOSIS — K439 Ventral hernia without obstruction or gangrene: Secondary | ICD-10-CM

## 2017-03-10 DIAGNOSIS — I2510 Atherosclerotic heart disease of native coronary artery without angina pectoris: Secondary | ICD-10-CM | POA: Diagnosis not present

## 2017-03-10 DIAGNOSIS — G8929 Other chronic pain: Secondary | ICD-10-CM | POA: Diagnosis not present

## 2017-03-10 DIAGNOSIS — R1011 Right upper quadrant pain: Secondary | ICD-10-CM | POA: Diagnosis not present

## 2017-03-10 LAB — CBC WITH DIFFERENTIAL/PLATELET
BASOS ABS: 0 10*3/uL (ref 0.0–0.1)
BASOS PCT: 1 % (ref 0.0–3.0)
EOS ABS: 0.4 10*3/uL (ref 0.0–0.7)
Eosinophils Relative: 8.2 % — ABNORMAL HIGH (ref 0.0–5.0)
HCT: 44.2 % (ref 39.0–52.0)
Hemoglobin: 14.6 g/dL (ref 13.0–17.0)
LYMPHS ABS: 1.2 10*3/uL (ref 0.7–4.0)
Lymphocytes Relative: 26 % (ref 12.0–46.0)
MCHC: 33.1 g/dL (ref 30.0–36.0)
MCV: 89.6 fl (ref 78.0–100.0)
MONO ABS: 0.5 10*3/uL (ref 0.1–1.0)
Monocytes Relative: 10 % (ref 3.0–12.0)
NEUTROS ABS: 2.5 10*3/uL (ref 1.4–7.7)
NEUTROS PCT: 54.8 % (ref 43.0–77.0)
PLATELETS: 206 10*3/uL (ref 150.0–400.0)
RBC: 4.93 Mil/uL (ref 4.22–5.81)
RDW: 16 % — AB (ref 11.5–15.5)
WBC: 4.6 10*3/uL (ref 4.0–10.5)

## 2017-03-10 LAB — BASIC METABOLIC PANEL
BUN: 11 mg/dL (ref 6–23)
CHLORIDE: 103 meq/L (ref 96–112)
CO2: 33 mEq/L — ABNORMAL HIGH (ref 19–32)
Calcium: 9.8 mg/dL (ref 8.4–10.5)
Creatinine, Ser: 1.1 mg/dL (ref 0.40–1.50)
GFR: 83.67 mL/min (ref >60.00)
Glucose, Bld: 76 mg/dL (ref 70–99)
POTASSIUM: 3.9 meq/L (ref 3.5–5.1)
Sodium: 140 mEq/L (ref 135–145)

## 2017-03-10 MED ORDER — NA SULFATE-K SULFATE-MG SULF 17.5-3.13-1.6 GM/177ML PO SOLN: 1.0000 | Freq: Once | ORAL | 0 refills | Status: AC

## 2017-03-10 NOTE — Patient Instructions (Addendum)
Please go to the basement level to have your labs drawn.  You have been scheduled for a colonoscopy. Please follow written instructions given to you at your visit today.  Please pick up your prep supplies at the pharmacy within the next 1-3 days. If you use inhalers (even only as needed), please bring them with you on the day of your procedure. Your physician has requested that you go to www.startemmi.com and enter the access code given to you at your visit today. This web site gives a general overview about your procedure. However, you should still follow specific instructions given to you by our office regarding your preparation for the procedure.   You have been scheduled for a CT scan of the abdomen and pelvis at [facility] ([address]---this is in the same building as [facility]).   You are scheduled on Wednesday 03-15-2017 at 4:00 PM. You should arrive 15 minutes prior to your appointment time for registration. Please follow the written instructions below on the day of your exam:  WARNING: IF YOU ARE ALLERGIC TO IODINE/X-RAY DYE, PLEASE NOTIFY RADIOLOGY IMMEDIATELY AT [phone]! YOU WILL BE GIVEN A 13 HOUR PREMEDICATION PREP.  1) Do not eat or drink anything after 12:00 Noon (4 hours prior to your test) 2) You have been given 2 bottles of oral contrast to drink. The solution may taste               better if refrigerated, but do NOT add ice or any other liquid to this solution. Shake             well before drinking.    Drink 1 bottle of contrast @ 2:00 PM (2 hours prior to your exam)  Drink 1 bottle of contrast @ 3:00 PM (1 hour prior to your exam)  You may take any medications as prescribed with a small amount of water except for the following: Metformin, Glucophage, Glucovance, Avandamet, Riomet, Fortamet, Actoplus Met, Janumet, Glumetza or Metaglip. The above medications must be held the day of the exam AND 48 hours after the exam.  The purpose of you  drinking the oral contrast is to aid in the visualization of your intestinal tract. The contrast solution may cause some diarrhea. Before your exam is started, you will be given a small amount of fluid to drink. Depending on your individual set of symptoms, you may also receive an intravenous injection of x-ray contrast/dye. Plan on being at [facility] for 30 minutes or long, depending on the type of exam you are having performed.  If you have any questions regarding your exam or if you need to reschedule, you may call the CT department at [phone] between the hours of 8:00 am and 5:00 pm, Monday-Friday.  ________________________________________________________________________

## 2017-03-10 NOTE — Progress Notes (Signed)
Subjective:    Patient ID: Tommy Bauer, male    DOB: 1941-08-01, 76 y.o.   MRN: [id number]  HPI Tommy Bauer is a pleasant 76 year old African-American male, known to Dr. Fuller Plan. He comes in today to discuss follow-up colonoscopy but also has complaints of upper abdominal discomfort. He has history of hypertension, small CVA about 1 year ago which affected the vision in his left eye. History of prostate cancer status post prostatectomy. He had colonoscopy last in May 2013 for follow-up of adenomatous polyps. He was found to have 3 sessile polyps the largest 8 mm in the rectum and also noted internal hemorrhoids. Path consistent with tubular adenomas and one prolapse-type polyp. Patient relates that over the past 2 months or so he has been having intermittent upper abdominal discomfort. He seems to notice this more when bending over particularly first thing in the morning or if he goes from a lying to sitting position or pick something up. He has not noticed any change with eating. He says his appetite is good and his weight has been stable he has not had any nausea or vomiting. Bowel movements have been normal no melena or hematochezia. He says she is generally doing a lot of physical work lifting etc. He is status post right inguinal hernia repair, no other abdominal surgeries.  Review of Systems Pertinent positive and negative review of systems were noted in the above HPI section.  All other review of systems was otherwise negative.  Outpatient Encounter Prescriptions as of 03/10/2017  Medication Sig  . acetaminophen (TYLENOL) 500 MG tablet Take 500 mg by mouth every 4 (four) hours as needed.  Marland Kitchen aspirin 81 MG tablet Take 1 tablet (81 mg total) by mouth daily.  Marland Kitchen atorvastatin (LIPITOR) 20 MG tablet Take 1 tablet (20 mg total) by mouth daily.  . benazepril (LOTENSIN) 40 MG tablet TAKE ONE TABLET BY MOUTH DAILY  . chlorthalidone (HYGROTON) 25 MG tablet Take 1 tablet (25 mg total) by mouth daily.  Marland Kitchen  KLOR-CON M20 20 MEQ tablet TAKE ONE TABLET BY MOUTH ONCE DAILY  . metoprolol succinate (TOPROL-XL) 100 MG 24 hr tablet Take 1 tablet (100 mg total) by mouth daily. Take with or immediately following a meal.  . Na Sulfate-K Sulfate-Mg Sulf 17.5-3.13-1.6 GM/180ML SOLN Take 1 kit by mouth once.   No facility-administered encounter medications on file as of 03/10/2017.    No Known Allergies Patient Active Problem List   Diagnosis Date Noted  . Central retinal artery occlusion 07/15/2015  . 76 year-old gentleman s/p radical prostatectomy for pT3a adenocarcinoma with a Gleason's Score of 4+3, with extracapsular extension noted on pathology, and rising PSA of 0.36 05/19/2010  . TOBACCO ABUSE 11/06/2009  . PROSTATE SPECIFIC ANTIGEN, ELEVATED 05/06/2009  . CARPAL TUNNEL SYNDROME 10/15/2008  . BACK PAIN 07/16/2008  . INGUINAL HERNIA 12/04/2007  . Cardiovascular disease 11/12/2007  . ERECTILE DYSFUNCTION, ORGANIC 07/11/2007  . HEADACHE 07/11/2007  . Essential hypertension 06/08/2007  . History of colonic polyps 06/08/2007   Social History   Social History  . Marital status: Married    Spouse name: N/A  . Number of children: N/A  . Years of education: N/A   Occupational History  .  Retired    retired Games developer   Social History Main Topics  . Smoking status: Current Every Day Smoker    Packs/day: 0.50    Years: 60.00    Types: Cigarettes  . Smokeless tobacco: Never Used  . Alcohol use Yes  Comment: occasionally  . Drug use: No  . Sexual activity: Not on file   Other Topics Concern  . Not on file   Social History Narrative  . No narrative on file    Tommy Bauer family history is not on file.      Objective:    Vitals:   03/10/17 1036  BP: 120/66  Pulse: 80    Physical Exam  in well-developed older African-American male in no acute distress, accompanied by his wife, both pleasant, height 5 foot 11, weight 193, BMI of 26.5. HEENT ;nontraumatic normocephalic EOMI  PERRLA sclera anicteric, Cardiovascular ;regular rate and rhythm with S1-S2 no murmur or gallop, Pulmonary ;clear bilaterally, Abdomen ;soft, bowel sounds are present is no palpable mass or hepatosplenomegaly he does appear to have a ventral hernia in the epigastrium and hypogastrium which is tender with palpation and on rising from a lying to sitting position, serious completely reduced when patient supine,, Rectal; exam not done, Ext; no clubbing cyanosis or edema skin warm and dry, Neuropsych; mood and affect appropriate       Assessment & Plan:   #44 76 year old African-American male with history of adenomatous colon polyps, last colonoscopy May 2013 due for follow-up #2  2 month history of upper abdominal discomfort with positional aggravating factors. Exam is consistent with a midline ventral hernia versus diastases though suspect hernia by symptoms #3 history of CVA 2017-/Central retinal artery occlusion affecting left eye-maintained only on baby aspirin #4 hypertension #5 history prostate CA status post prostatectomy  Plan; patient will be scheduled for colonoscopy with Dr. Fuller Plan. Procedure discussed in detail with the patient and his wife and they're agreeable to proceed. He will stay on his baby aspirin. Will schedule patient for CT scan of the abdomen and pelvis with contrast. Further plans pending findings. Patient would be interested in surgical referral if ventral hernia confirmed.    Amy Genia Harold PA-C 03/10/2017   Cc: Marletta Lor, MD

## 2017-03-10 NOTE — Progress Notes (Signed)
Reviewed and agree with management plan.  Mariane Burpee T. Savanna Dooley, MD FACG 

## 2017-03-15 ENCOUNTER — Other Ambulatory Visit: Payer: Medicare Other

## 2017-03-21 ENCOUNTER — Ambulatory Visit (INDEPENDENT_AMBULATORY_CARE_PROVIDER_SITE_OTHER)
Admission: RE | Admit: 2017-03-21 | Discharge: 2017-03-21 | Disposition: A | Discharge status: Home / Self Care | Payer: Medicare Other | Source: Ambulatory Visit | Attending: Physician Assistant | Admitting: Physician Assistant

## 2017-03-21 DIAGNOSIS — G8929 Other chronic pain: Secondary | ICD-10-CM

## 2017-03-21 DIAGNOSIS — R1032 Left lower quadrant pain: Secondary | ICD-10-CM

## 2017-03-21 DIAGNOSIS — R1011 Right upper quadrant pain: Secondary | ICD-10-CM | POA: Diagnosis not present

## 2017-03-21 DIAGNOSIS — K439 Ventral hernia without obstruction or gangrene: Secondary | ICD-10-CM

## 2017-03-21 DIAGNOSIS — R1010 Upper abdominal pain, unspecified: Secondary | ICD-10-CM | POA: Diagnosis not present

## 2017-03-21 MED ORDER — IOPAMIDOL (ISOVUE-300) INJECTION 61%
100.0000 mL | Freq: Once | INTRAVENOUS | Status: AC | PRN
  Administered 2017-03-21: 100 mL via INTRAVENOUS

## 2017-05-17 ENCOUNTER — Other Ambulatory Visit: Payer: Self-pay | Admitting: Internal Medicine

## 2017-05-18 ENCOUNTER — Encounter: Payer: Self-pay | Admitting: Gastroenterology

## 2017-05-18 ENCOUNTER — Ambulatory Visit (AMBULATORY_SURGERY_CENTER): Payer: Medicare Other | Admitting: Gastroenterology

## 2017-05-18 VITALS — BP 104/54 | HR 64 | Temp 98.0°F | Resp 15 | Ht 71.5 in | Wt 193.0 lb

## 2017-05-18 DIAGNOSIS — Z86010 Personal history of colonic polyps: Secondary | ICD-10-CM

## 2017-05-18 DIAGNOSIS — D123 Benign neoplasm of transverse colon: Secondary | ICD-10-CM

## 2017-05-18 DIAGNOSIS — I10 Essential (primary) hypertension: Secondary | ICD-10-CM | POA: Diagnosis not present

## 2017-05-18 MED ORDER — SODIUM CHLORIDE 0.9 % IV SOLN: 500.0000 mL | INTRAVENOUS | Status: AC

## 2017-05-18 NOTE — Op Note (Signed)
[place] Patient Name: Tommy Bauer Procedure Date: 05/18/2017 10:48 AM MRN: [id number] Endoscopist: Ladene Artist , MD Age: 76 Referring MD:  Date of Birth: [birth year] Gender: Male Account #: [id number] Procedure:                Colonoscopy Indications:              Surveillance: Personal history of adenomatous                            polyps on last colonoscopy 5 years ago Medicines:                Monitored Anesthesia Care Procedure:                Pre-Anesthesia Assessment:                           - Prior to the procedure, a History and Physical                            was performed, and patient medications and                            allergies were reviewed. The patient's tolerance of                            previous anesthesia was also reviewed. The risks                            and benefits of the procedure and the sedation                            options and risks were discussed with the patient.                            All questions were answered, and informed consent                            was obtained. Prior Anticoagulants: The patient has                            taken no previous anticoagulant or antiplatelet                            agents. ASA Grade Assessment: III - A patient with                            severe systemic disease. After reviewing the risks                            and benefits, the patient was deemed in                            satisfactory condition to undergo the procedure.  After obtaining informed consent, the colonoscope                            was passed under direct vision. Throughout the                            procedure, the patient's blood pressure, pulse, and                            oxygen saturations were monitored continuously. The                            Model PCF-[id number] scope was introduced                            through the anus and  advanced to the the cecum,                            identified by appendiceal orifice and ileocecal                            valve. The ileocecal valve, appendiceal orifice,                            and rectum were photographed. The quality of the                            bowel preparation was good. The colonoscopy was                            performed without difficulty. The patient tolerated                            the procedure well. Scope In: 11:00:21 AM Scope Out: 11:13:37 AM Scope Withdrawal Time: 0 hours 11 minutes 10 seconds  Total Procedure Duration: 0 hours 13 minutes 16 seconds  Findings:                 The perianal and digital rectal examinations were                            normal.                           A 6 mm polyp was found in the splenic flexure. The                            polyp was sessile. The polyp was removed with a                            cold snare. Resection and retrieval were complete.                           A single medium-mouthed diverticulum was found in  the descending colon.                           Internal hemorrhoids were found during                            retroflexion. The hemorrhoids were small and Grade                            I (internal hemorrhoids that do not prolapse).                           The exam was otherwise without abnormality on                            direct and retroflexion views. Complications:            No immediate complications. Estimated blood loss:                            None. Estimated Blood Loss:     Estimated blood loss: none. Impression:               - One 6 mm polyp at the splenic flexure, removed                            with a cold snare. Resected and retrieved.                           - Diverticulosis in the descending colon.                           - Internal hemorrhoids.                           - The examination was otherwise normal on  direct                            and retroflexion views. Recommendation:           - Patient has a contact number available for                            emergencies. The signs and symptoms of potential                            delayed complications were discussed with the                            patient. Return to normal activities tomorrow.                            Written discharge instructions were provided to the                            patient.                           -  Resume previous diet.                           - Continue present medications.                           - Await pathology results.                           - No repeat colonoscopy due to age. Ladene Artist, MD 05/18/2017 11:16:16 AM This report has been signed electronically.

## 2017-05-18 NOTE — Progress Notes (Signed)
Called to room to assist during endoscopic procedure.  Patient ID and intended procedure confirmed with present staff. Received instructions for my participation in the procedure from the performing physician.  

## 2017-05-18 NOTE — Patient Instructions (Signed)
   INFORMATION ON POLYPS,DIVERTICULOSIS,AND HEMORRHOIDS GIVEN TO YOU TODAY    AWAIT PATHOLOGY RESULTS ON POLYP REMOVED    YOU HAD AN ENDOSCOPIC PROCEDURE TODAY AT THE [facility]:   Refer to the procedure report that was given to you for any specific questions about what was found during the examination.  If the procedure report does not answer your questions, please call your gastroenterologist to clarify.  If you requested that your care partner not be given the details of your procedure findings, then the procedure report has been included in a sealed envelope for you to review at your convenience later.  YOU SHOULD EXPECT: Some feelings of bloating in the abdomen. Passage of more gas than usual.  Walking can help get rid of the air that was put into your GI tract during the procedure and reduce the bloating. If you had a lower endoscopy (such as a colonoscopy or flexible sigmoidoscopy) you may notice spotting of blood in your stool or on the toilet paper. If you underwent a bowel prep for your procedure, you may not have a normal bowel movement for a few days.  Please Note:  You might notice some irritation and congestion in your nose or some drainage.  This is from the oxygen used during your procedure.  There is no need for concern and it should clear up in a day or so.  SYMPTOMS TO REPORT IMMEDIATELY:   Following lower endoscopy (colonoscopy or flexible sigmoidoscopy):  Excessive amounts of blood in the stool  Significant tenderness or worsening of abdominal pains  Swelling of the abdomen that is new, acute  Fever of 100F or higher    For urgent or emergent issues, a gastroenterologist can be reached at any hour by calling [phone].   DIET:  We do recommend a small meal at first, but then you may proceed to your regular diet.  Drink plenty of fluids but you should avoid alcoholic beverages for 24 hours.  ACTIVITY:  You should plan to take it easy for the rest of  today and you should NOT DRIVE or use heavy machinery until tomorrow (because of the sedation medicines used during the test).    FOLLOW UP: Our staff will call the number listed on your records the next business day following your procedure to check on you and address any questions or concerns that you may have regarding the information given to you following your procedure. If we do not reach you, we will leave a message.  However, if you are feeling well and you are not experiencing any problems, there is no need to return our call.  We will assume that you have returned to your regular daily activities without incident.  If any biopsies were taken you will be contacted by phone or by letter within the next 1-3 weeks.  Please call us at [phone] if you have not heard about the biopsies in 3 weeks.    SIGNATURES/CONFIDENTIALITY: You and/or your care partner have signed paperwork which will be entered into your electronic medical record.  These signatures attest to the fact that that the information above on your After Visit Summary has been reviewed and is understood.  Full responsibility of the confidentiality of this discharge information lies with you and/or your care-partner. 

## 2017-05-18 NOTE — Progress Notes (Signed)
Report to PACU, RN, vss, BBS= Clear.  

## 2017-05-19 ENCOUNTER — Telehealth: Payer: Self-pay

## 2017-05-19 NOTE — Telephone Encounter (Signed)
  Follow up Call-  Call back number 05/18/2017 05/18/2017  Post procedure Call Back phone  # [phone] -[phone]  Permission to leave phone message Yes Yes  Some recent data might be hidden     Patient questions:  Do you have a fever, pain , or abdominal swelling? No. Pain Score  0 *  Have you tolerated food without any problems? Yes.    Have you been able to return to your normal activities? Yes.    Do you have any questions about your discharge instructions: Diet   No. Medications  No. Follow up visit  No.  Do you have questions or concerns about your Care? No.  Actions: * If pain score is 4 or above: No action needed, pain <4.  I spoke with Webb Silversmith, pt's wife.  No problems noted per Webb Silversmith. maw

## 2017-05-25 ENCOUNTER — Encounter: Payer: Self-pay | Admitting: Gastroenterology

## 2017-07-06 ENCOUNTER — Ambulatory Visit (INDEPENDENT_AMBULATORY_CARE_PROVIDER_SITE_OTHER): Payer: Medicare Other | Admitting: Internal Medicine

## 2017-07-06 ENCOUNTER — Encounter: Payer: Self-pay | Admitting: Internal Medicine

## 2017-07-06 VITALS — BP 110/62 | HR 68 | Temp 98.2°F | Ht 71.5 in | Wt 189.2 lb

## 2017-07-06 DIAGNOSIS — F17200 Nicotine dependence, unspecified, uncomplicated: Secondary | ICD-10-CM | POA: Diagnosis not present

## 2017-07-06 DIAGNOSIS — Z86010 Personal history of colonic polyps: Secondary | ICD-10-CM | POA: Diagnosis not present

## 2017-07-06 DIAGNOSIS — I10 Essential (primary) hypertension: Secondary | ICD-10-CM | POA: Diagnosis not present

## 2017-07-06 DIAGNOSIS — I2510 Atherosclerotic heart disease of native coronary artery without angina pectoris: Secondary | ICD-10-CM | POA: Diagnosis not present

## 2017-07-06 NOTE — Progress Notes (Signed)
Subjective:    Patient ID: Tommy Bauer, male    DOB: August 24, 1941, 76 y.o.   MRN: [id number]  HPI  76 year old patient who is seen today for his biannual follow-up He continues to do well.  He has lost 5 pounds over the past 6 months.  He states that he no longer has a car and does much more walking.  He has also been eating quite healthy. Denies any cardiopulmonary complaints.  He continues to smoke but has cut down a bit. He has essential hypertension He has a history colonic polyps and since his last visit here, has had follow-up colonoscopy. He is status post radical prostatectomy for adenocarcinoma of the prostate  Past Medical History:  Diagnosis Date  . ADENOCARCINOMA, PROSTATE 05/19/2010  . BACK PAIN 07/16/2008  . Carpal tunnel syndrome 10/15/2008  . COLONIC POLYPS, HX OF 06/08/2007  . Contusion    left shoulder,left knee,s/p recent fall end of march 2013  . CVA (cerebral vascular accident) ([place])   . ERECTILE DYSFUNCTION, ORGANIC 07/11/2007  . Headache(784.0) 07/11/2007  . HYPERTENSION 06/08/2007  . INGUINAL HERNIA 12/04/2007  . LEFT VENTRICULAR FUNCTION, DECREASED 11/12/2007  . Prostate cancer ([place]) 09/14/2009   prostateectomy,adenocarcinoma,gleason=4+3=7  . PROSTATE SPECIFIC ANTIGEN, ELEVATED 05/06/2009  . TOBACCO ABUSE 11/06/2009     Social History   Social History  . Marital status: Married    Spouse name: N/A  . Number of children: N/A  . Years of education: N/A   Occupational History  .  Retired    retired Games developer   Social History Main Topics  . Smoking status: Current Every Day Smoker    Packs/day: 0.50    Years: 60.00    Types: Cigarettes  . Smokeless tobacco: Never Used  . Alcohol use Yes     Comment: occasionally  . Drug use: No  . Sexual activity: Not on file   Other Topics Concern  . Not on file   Social History Narrative  . No narrative on file    Past Surgical History:  Procedure Laterality Date  . HERNIA REPAIR     right ingunial  .  PROSTATE SURGERY  09/14/2009   radical prostatectomy /gleason=4+3-7    Family History  Problem Relation Age of Onset  . Colon cancer Neg Hx     No Known Allergies  Current Outpatient Prescriptions on File Prior to Visit  Medication Sig Dispense Refill  . acetaminophen (TYLENOL) 500 MG tablet Take 500 mg by mouth every 4 (four) hours as needed.    Marland Kitchen aspirin 81 MG tablet Take 1 tablet (81 mg total) by mouth daily. 30 tablet   . atorvastatin (LIPITOR) 20 MG tablet Take 1 tablet (20 mg total) by mouth daily. 90 tablet 3  . benazepril (LOTENSIN) 40 MG tablet TAKE ONE TABLET BY MOUTH DAILY 90 tablet 3  . chlorthalidone (HYGROTON) 25 MG tablet TAKE ONE TABLET BY MOUTH DAILY 90 tablet 3  . KLOR-CON M20 20 MEQ tablet TAKE ONE TABLET BY MOUTH ONCE DAILY 90 tablet 3  . metoprolol succinate (TOPROL-XL) 100 MG 24 hr tablet TAKE ONE TABLET BY MOUTH ONCE DAILY TAKE  WITH  OR  IMMEDIATELY  FOLLOWING  A  MEAL 90 tablet 3   Current Facility-Administered Medications on File Prior to Visit  Medication Dose Route Frequency Provider Last Rate Last Dose  . 0.9 %  sodium chloride infusion  500 mL Intravenous Continuous Ladene Artist, MD        BP 110/62 (BP  Location: Left Arm, Patient Position: Sitting, Cuff Size: Normal)   Pulse 68   Temp 98.2 F (36.8 C) (Oral)   Ht 5' 11.5" (1.816 m)   Wt 189 lb 3.2 oz (85.8 kg)   SpO2 98%   BMI 26.02 kg/m     Review of Systems  Constitutional: Negative for appetite change, chills, fatigue and fever.  HENT: Negative for congestion, dental problem, ear pain, hearing loss, sore throat, tinnitus, trouble swallowing and voice change.   Eyes: Negative for pain, discharge and visual disturbance.  Respiratory: Negative for cough, chest tightness, wheezing and stridor.   Cardiovascular: Negative for chest pain, palpitations and leg swelling.  Gastrointestinal: Negative for abdominal distention, abdominal pain, blood in stool, constipation, diarrhea, nausea and  vomiting.  Genitourinary: Negative for difficulty urinating, discharge, flank pain, genital sores, hematuria and urgency.  Musculoskeletal: Negative for arthralgias, back pain, gait problem, joint swelling, myalgias and neck stiffness.  Skin: Negative for rash.  Neurological: Negative for dizziness, syncope, speech difficulty, weakness, numbness and headaches.  Hematological: Negative for adenopathy. Does not bruise/bleed easily.  Psychiatric/Behavioral: Negative for behavioral problems and dysphoric mood. The patient is not nervous/anxious.        Objective:   Physical Exam  Constitutional: He is oriented to person, place, and time. He appears well-developed.  Blood pressure 110/70  HENT:  Head: Normocephalic.  Right Ear: External ear normal.  Left Ear: External ear normal.  Eyes: Conjunctivae and EOM are normal.  Neck: Normal range of motion.  Cardiovascular: Normal rate and normal heart sounds.   Pulmonary/Chest: Breath sounds normal.  Abdominal: Bowel sounds are normal.  Musculoskeletal: Normal range of motion. He exhibits no edema or tenderness.  Neurological: He is alert and oriented to person, place, and time.  Psychiatric: He has a normal mood and affect. His behavior is normal.          Assessment & Plan:   Essential hypertension, well-controlled History colonic polyps status post colonoscopy July 2018 History of prostate cancer History of ongoing tobacco use.  Total smoking cessation encouraged Dyslipidemia.  Continue statin therapy  CPX 6 months  KWIATKOWSKI,PETER Pilar Plate

## 2017-07-06 NOTE — Patient Instructions (Signed)
Limit your sodium (Salt) intake  Please check your blood pressure on a regular basis.  If it is consistently greater than 150/90, please make an office appointment.    It is important that you exercise regularly, at least 20 minutes 3 to 4 times per week.  If you develop chest pain or shortness of breath seek  medical attention.  Return in 6 months for follow-up  

## 2017-07-07 ENCOUNTER — Ambulatory Visit: Payer: Medicare Other | Admitting: Internal Medicine

## 2017-08-01 DIAGNOSIS — H3412 Central retinal artery occlusion, left eye: Secondary | ICD-10-CM | POA: Diagnosis not present

## 2017-08-01 DIAGNOSIS — H2513 Age-related nuclear cataract, bilateral: Secondary | ICD-10-CM | POA: Diagnosis not present

## 2017-08-01 DIAGNOSIS — H31091 Other chorioretinal scars, right eye: Secondary | ICD-10-CM | POA: Diagnosis not present

## 2017-08-01 DIAGNOSIS — H35033 Hypertensive retinopathy, bilateral: Secondary | ICD-10-CM | POA: Diagnosis not present

## 2017-08-09 DIAGNOSIS — Z8546 Personal history of malignant neoplasm of prostate: Secondary | ICD-10-CM | POA: Diagnosis not present

## 2017-08-16 DIAGNOSIS — N5201 Erectile dysfunction due to arterial insufficiency: Secondary | ICD-10-CM | POA: Diagnosis not present

## 2017-08-16 DIAGNOSIS — Z8546 Personal history of malignant neoplasm of prostate: Secondary | ICD-10-CM | POA: Diagnosis not present

## 2017-09-18 ENCOUNTER — Other Ambulatory Visit: Payer: Self-pay | Admitting: Radiation Oncology

## 2017-10-02 ENCOUNTER — Other Ambulatory Visit: Payer: Self-pay | Admitting: Internal Medicine

## 2018-01-09 ENCOUNTER — Ambulatory Visit (INDEPENDENT_AMBULATORY_CARE_PROVIDER_SITE_OTHER): Payer: Medicare Other | Admitting: Internal Medicine

## 2018-01-09 ENCOUNTER — Encounter: Payer: Self-pay | Admitting: Internal Medicine

## 2018-01-09 VITALS — Temp 98.2°F | Ht 72.0 in | Wt 200.0 lb

## 2018-01-09 DIAGNOSIS — Z86010 Personal history of colonic polyps: Secondary | ICD-10-CM | POA: Diagnosis not present

## 2018-01-09 DIAGNOSIS — I10 Essential (primary) hypertension: Secondary | ICD-10-CM | POA: Diagnosis not present

## 2018-01-09 DIAGNOSIS — Z0000 Encounter for general adult medical examination without abnormal findings: Secondary | ICD-10-CM | POA: Diagnosis not present

## 2018-01-09 LAB — CBC WITH DIFFERENTIAL/PLATELET
BASOS PCT: 0.3 % (ref 0.0–3.0)
Basophils Absolute: 0 10*3/uL (ref 0.0–0.1)
EOS PCT: 9.2 % — AB (ref 0.0–5.0)
Eosinophils Absolute: 0.4 10*3/uL (ref 0.0–0.7)
HCT: 46.2 % (ref 39.0–52.0)
HEMOGLOBIN: 15.7 g/dL (ref 13.0–17.0)
Lymphocytes Relative: 29.1 % (ref 12.0–46.0)
Lymphs Abs: 1.1 10*3/uL (ref 0.7–4.0)
MCHC: 33.9 g/dL (ref 30.0–36.0)
MCV: 89.2 fl (ref 78.0–100.0)
Monocytes Absolute: 0.5 10*3/uL (ref 0.1–1.0)
Monocytes Relative: 12 % (ref 3.0–12.0)
NEUTROS ABS: 1.9 10*3/uL (ref 1.4–7.7)
Neutrophils Relative %: 49.4 % (ref 43.0–77.0)
PLATELETS: 201 10*3/uL (ref 150.0–400.0)
RBC: 5.18 Mil/uL (ref 4.22–5.81)
RDW: 14.6 % (ref 11.5–15.5)
WBC: 3.9 10*3/uL — AB (ref 4.0–10.5)

## 2018-01-09 LAB — LIPID PANEL
CHOL/HDL RATIO: 4
Cholesterol: 196 mg/dL (ref 0–200)
HDL: 54.5 mg/dL (ref >39.00)
LDL CALC: 122 mg/dL — AB (ref 0–99)
NonHDL: 141.16
Triglycerides: 94 mg/dL (ref 0.0–149.0)
VLDL: 18.8 mg/dL (ref 0.0–40.0)

## 2018-01-09 LAB — COMPREHENSIVE METABOLIC PANEL
ALBUMIN: 4.4 g/dL (ref 3.5–5.2)
ALT: 13 U/L (ref 0–53)
AST: 14 U/L (ref 0–37)
Alkaline Phosphatase: 53 U/L (ref 39–117)
BUN: 8 mg/dL (ref 6–23)
CHLORIDE: 98 meq/L (ref 96–112)
CO2: 33 meq/L — AB (ref 19–32)
Calcium: 10 mg/dL (ref 8.4–10.5)
Creatinine, Ser: 1.06 mg/dL (ref 0.40–1.50)
GFR: 87.14 mL/min (ref >60.00)
GLUCOSE: 72 mg/dL (ref 70–99)
POTASSIUM: 3.5 meq/L (ref 3.5–5.1)
SODIUM: 140 meq/L (ref 135–145)
Total Bilirubin: 1.1 mg/dL (ref 0.2–1.2)
Total Protein: 7.4 g/dL (ref 6.0–8.3)

## 2018-01-09 LAB — TSH: TSH: 2.07 u[IU]/mL (ref 0.35–4.50)

## 2018-01-09 NOTE — Progress Notes (Signed)
Subjective:    Patient ID: Tommy Bauer, male    DOB: Dec 16, 1940, 77 y.o.   MRN: [id number]  HPI 77 year old patient who is seen today for annual health assessment. He has a history of colonic polyps and did have a follow-up colonoscopy July 2018. He has a history of central retinal artery occlusion and is followed by ophthalmology twice annually. He has essential hypertension which has been well controlled.    Medicare wellness visit  1. Risk factors, based on past  M,S,F history.cardiovascular risk factors include history of essential hypertension, ongoing tobacco use.  He also has a history of cerebrovascular disease with history of central retinal artery occlusion.  He has cut down tobacco use to 1 pack/week  2.  Physical activities:remains quite active.  He states he walks 3 miles daily 3.  Depression/mood:no history of major depression or mood disorder  4.  Hearing:no significant deficits  5.  ADL's:independent  6.  Fall risk:low  7.  Home safety:no problems identified  8.  Height weight, and visual acuity;height and weight stable no change in visual acuity.  History of central retinal artery occlusion  9.  Counseling:heart healthy diet.  Total smoking cessation encouraged  10. Lab orders based on risk factors:laboratory profile reviewed  11. Referral :follow-up ophthalmology as well as urology  12. Care plan:continue efforts at aggressive risk factor modification  13. Cognitive assessment: alert in order with normal affect no cognitive dysfunction  14. Screening: Patient provided with a written and personalized 5-10 year screening schedule in the AVS.    15. Provider List Update: primary care ophthalmology and urology   Review of Systems  Constitutional: Negative for appetite change, chills, fatigue and fever.  HENT: Negative for congestion, dental problem, ear pain, hearing loss, sore throat, tinnitus, trouble swallowing and voice change.   Eyes:  Negative for pain, discharge and visual disturbance.  Respiratory: Negative for cough, chest tightness, wheezing and stridor.   Cardiovascular: Negative for chest pain, palpitations and leg swelling.  Gastrointestinal: Negative for abdominal distention, abdominal pain, blood in stool, constipation, diarrhea, nausea and vomiting.  Genitourinary: Negative for difficulty urinating, discharge, flank pain, genital sores, hematuria and urgency.  Musculoskeletal: Negative for arthralgias, back pain, gait problem, joint swelling, myalgias and neck stiffness.  Skin: Negative for rash.  Neurological: Negative for dizziness, syncope, speech difficulty, weakness, numbness and headaches.  Hematological: Negative for adenopathy. Does not bruise/bleed easily.  Psychiatric/Behavioral: Negative for behavioral problems and dysphoric mood. The patient is not nervous/anxious.        Objective:   Physical Exam  Constitutional: He appears well-developed and well-nourished.  HENT:  Head: Normocephalic and atraumatic.  Right Ear: External ear normal.  Left Ear: External ear normal.  Nose: Nose normal.  Mouth/Throat: Oropharynx is clear and moist.  Eyes: Conjunctivae and EOM are normal. Pupils are equal, round, and reactive to light. No scleral icterus.  Mild arcus senilis  Neck: Normal range of motion. Neck supple. No JVD present. No thyromegaly present.  Cardiovascular: Regular rhythm, normal heart sounds and intact distal pulses. Exam reveals no gallop and no friction rub.  No murmur heard. Pedal pulses faint  Pulmonary/Chest: Effort normal and breath sounds normal. He exhibits no tenderness.  Abdominal: Soft. Bowel sounds are normal. He exhibits no distension and no mass. There is no tenderness.  Genitourinary: Penis normal.  Genitourinary Comments: Uncircumcised  Musculoskeletal: Normal range of motion. He exhibits no edema or tenderness.  Lymphadenopathy:    He has no cervical adenopathy.  Neurological: He is alert. He has normal reflexes. No cranial nerve deficit. Coordination normal.  Skin: Skin is warm and dry. No rash noted.  Psychiatric: He has a normal mood and affect. His behavior is normal.          Assessment & Plan:   Essential hypertension well-controlled Subsequent Medicare wellness visit History of colonic polyps History of prostate cancer.  Follow-up urology Ongoing tobacco use.  Patient has decreased to 1 pack/week.  Total smoking cessation encouraged History of cerebrovascular disease with central retinal artery occlusion.  Continue efforts at risk factor modification.  Continue aspirin and statin therapy  Follow-up 6 months Review screening lab  Tommy Bauer

## 2018-01-09 NOTE — Patient Instructions (Signed)
Limit your sodium (Salt) intake  Please check your blood pressure on a regular basis.  If it is consistently greater than 150/90, please make an office appointment.  Smoking tobacco is very bad for your health. You should stop smoking immediately.    Follow-up urology and ophthalmology  Return in 6 months for follow-up

## 2018-05-02 ENCOUNTER — Other Ambulatory Visit: Payer: Self-pay | Admitting: Internal Medicine

## 2018-05-02 NOTE — Telephone Encounter (Signed)
Filled for 90 days.  Pt las seen 01/09/18 and advised to follow up in 6 months.  No further action required.

## 2018-05-09 ENCOUNTER — Ambulatory Visit (INDEPENDENT_AMBULATORY_CARE_PROVIDER_SITE_OTHER): Payer: Medicare Other | Admitting: Internal Medicine

## 2018-05-09 ENCOUNTER — Encounter: Payer: Self-pay | Admitting: Internal Medicine

## 2018-05-09 VITALS — BP 150/70

## 2018-05-09 DIAGNOSIS — F17200 Nicotine dependence, unspecified, uncomplicated: Secondary | ICD-10-CM

## 2018-05-09 DIAGNOSIS — I10 Essential (primary) hypertension: Secondary | ICD-10-CM | POA: Diagnosis not present

## 2018-05-09 MED ORDER — CHLORTHALIDONE 25 MG PO TABS: 25.0000 mg | ORAL_TABLET | Freq: Every day | ORAL | 3 refills | Status: DC

## 2018-05-09 MED ORDER — ATORVASTATIN CALCIUM 20 MG PO TABS: 20.0000 mg | ORAL_TABLET | Freq: Every day | ORAL | 3 refills | Status: DC

## 2018-05-09 MED ORDER — METOPROLOL SUCCINATE ER 100 MG PO TB24: ORAL_TABLET | ORAL | 3 refills | Status: DC

## 2018-05-09 NOTE — Patient Instructions (Signed)
Limit your sodium (Salt) intake  Please check your blood pressure on a regular basis.  If it is consistently greater than 150/90, please make an office appointment.  Smoking tobacco is very bad for your health. You should stop smoking immediately.  Return in 6 months for follow-up  

## 2018-05-09 NOTE — Progress Notes (Signed)
Subjective:    Patient ID: Tommy Bauer, male    DOB: 04-26-1941, 77 y.o.   MRN: [id number]  HPI  77 year old patient who is seen today for follow-up of hypertension. Doing quite well.  Continues to smoke.  Does have a history of cerebrovascular disease. Remote history of prostatectomy for prostate cancer with a Gleason score of 7  No cardiopulmonary or focal neurological symptoms Remains on statin therapy  Past Medical History:  Diagnosis Date  . ADENOCARCINOMA, PROSTATE 05/19/2010  . BACK PAIN 07/16/2008  . Carpal tunnel syndrome 10/15/2008  . COLONIC POLYPS, HX OF 06/08/2007  . Contusion    left shoulder,left knee,s/p recent fall end of march 2013  . CVA (cerebral vascular accident) ([place])   . ERECTILE DYSFUNCTION, ORGANIC 07/11/2007  . Headache(784.0) 07/11/2007  . HYPERTENSION 06/08/2007  . INGUINAL HERNIA 12/04/2007  . LEFT VENTRICULAR FUNCTION, DECREASED 11/12/2007  . Prostate cancer ([place]) 09/14/2009   prostateectomy,adenocarcinoma,gleason=4+3=7  . PROSTATE SPECIFIC ANTIGEN, ELEVATED 05/06/2009  . TOBACCO ABUSE 11/06/2009     Social History   Socioeconomic History  . Marital status: Married    Spouse name: Not on file  . Number of children: Not on file  . Years of education: Not on file  . Highest education level: Not on file  Occupational History    Employer: RETIRED    Comment: retired Games developer  Social Needs  . Financial resource strain: Not on file  . Food insecurity:    Worry: Not on file    Inability: Not on file  . Transportation needs:    Medical: Not on file    Non-medical: Not on file  Tobacco Use  . Smoking status: Current Every Day Smoker    Packs/day: 0.50    Years: 60.00    Pack years: 30.00    Types: Cigarettes  . Smokeless tobacco: Never Used  Substance and Sexual Activity  . Alcohol use: Yes    Comment: occasionally  . Drug use: No  . Sexual activity: Not on file  Lifestyle  . Physical activity:    Days per week: Not on file    Minutes per  session: Not on file  . Stress: Not on file  Relationships  . Social connections:    Talks on phone: Not on file    Gets together: Not on file    Attends religious service: Not on file    Active member of club or organization: Not on file    Attends meetings of clubs or organizations: Not on file    Relationship status: Not on file  . Intimate partner violence:    Fear of current or ex partner: Not on file    Emotionally abused: Not on file    Physically abused: Not on file    Forced sexual activity: Not on file  Other Topics Concern  . Not on file  Social History Narrative  . Not on file    Past Surgical History:  Procedure Laterality Date  . HERNIA REPAIR     right ingunial  . PROSTATE SURGERY  09/14/2009   radical prostatectomy /gleason=4+3-7    Family History  Problem Relation Age of Onset  . Colon cancer Neg Hx     No Known Allergies  Current Outpatient Medications on File Prior to Visit  Medication Sig Dispense Refill  . acetaminophen (TYLENOL) 500 MG tablet Take 500 mg by mouth every 4 (four) hours as needed.    Marland Kitchen aspirin 81 MG tablet Take 1 tablet (81  mg total) by mouth daily. 30 tablet   . benazepril (LOTENSIN) 40 MG tablet TAKE 1 TABLET BY MOUTH ONCE DAILY 90 tablet 0  . potassium chloride SA (K-DUR,KLOR-CON) 20 MEQ tablet TAKE 1 TABLET BY MOUTH ONCE DAILY 90 tablet 0   Current Facility-Administered Medications on File Prior to Visit  Medication Dose Route Frequency Provider Last Rate Last Dose  . 0.9 %  sodium chloride infusion  500 mL Intravenous Continuous Ladene Artist, MD        BP (!) 150/70     Review of Systems  Constitutional: Negative for appetite change, chills, fatigue and fever.  HENT: Negative for congestion, dental problem, ear pain, hearing loss, sore throat, tinnitus, trouble swallowing and voice change.   Eyes: Negative for pain, discharge and visual disturbance.  Respiratory: Negative for cough, chest tightness, wheezing and  stridor.   Cardiovascular: Negative for chest pain, palpitations and leg swelling.  Gastrointestinal: Negative for abdominal distention, abdominal pain, blood in stool, constipation, diarrhea, nausea and vomiting.  Genitourinary: Negative for difficulty urinating, discharge, flank pain, genital sores, hematuria and urgency.  Musculoskeletal: Positive for neck pain and neck stiffness. Negative for arthralgias, back pain, gait problem, joint swelling and myalgias.  Skin: Negative for rash.  Neurological: Negative for dizziness, syncope, speech difficulty, weakness, numbness and headaches.  Hematological: Negative for adenopathy. Does not bruise/bleed easily.  Psychiatric/Behavioral: Negative for behavioral problems and dysphoric mood. The patient is not nervous/anxious.        Objective:   Physical Exam  Constitutional: He is oriented to person, place, and time. He appears well-developed.  Blood pressure 140/74  HENT:  Head: Normocephalic.  Right Ear: External ear normal.  Left Ear: External ear normal.  Eyes: Conjunctivae and EOM are normal.  Neck: Normal range of motion.  Slight discomfort right lateral neck with head turning to the left  Cardiovascular: Normal rate and normal heart sounds.  Pulmonary/Chest: Breath sounds normal.  Abdominal: Bowel sounds are normal.  Musculoskeletal: Normal range of motion. He exhibits no edema or tenderness.  Neurological: He is alert and oriented to person, place, and time.  Psychiatric: He has a normal mood and affect. His behavior is normal.          Assessment & Plan:   Essential hypertension.  Fair control.  We will continue triple therapy Ongoing tobacco use.  Total smoking cessation encouraged History of cerebrovascular disease and central retinal artery occlusion  Follow-up 6 months  Marletta Lor

## 2018-07-12 ENCOUNTER — Ambulatory Visit: Payer: Medicare Other | Admitting: Internal Medicine

## 2018-08-06 DIAGNOSIS — H31091 Other chorioretinal scars, right eye: Secondary | ICD-10-CM | POA: Diagnosis not present

## 2018-08-06 DIAGNOSIS — H40013 Open angle with borderline findings, low risk, bilateral: Secondary | ICD-10-CM | POA: Diagnosis not present

## 2018-08-06 DIAGNOSIS — H3412 Central retinal artery occlusion, left eye: Secondary | ICD-10-CM | POA: Diagnosis not present

## 2018-08-06 DIAGNOSIS — H35033 Hypertensive retinopathy, bilateral: Secondary | ICD-10-CM | POA: Diagnosis not present

## 2018-08-06 DIAGNOSIS — H2513 Age-related nuclear cataract, bilateral: Secondary | ICD-10-CM | POA: Diagnosis not present

## 2018-08-10 DIAGNOSIS — Z8546 Personal history of malignant neoplasm of prostate: Secondary | ICD-10-CM | POA: Diagnosis not present

## 2018-08-10 LAB — PSA

## 2018-08-22 DIAGNOSIS — N5201 Erectile dysfunction due to arterial insufficiency: Secondary | ICD-10-CM | POA: Diagnosis not present

## 2018-08-22 DIAGNOSIS — Z8546 Personal history of malignant neoplasm of prostate: Secondary | ICD-10-CM | POA: Diagnosis not present

## 2018-08-28 ENCOUNTER — Encounter: Payer: Self-pay | Admitting: Internal Medicine

## 2018-09-17 DIAGNOSIS — H35033 Hypertensive retinopathy, bilateral: Secondary | ICD-10-CM | POA: Diagnosis not present

## 2018-09-17 DIAGNOSIS — H40053 Ocular hypertension, bilateral: Secondary | ICD-10-CM | POA: Diagnosis not present

## 2018-09-17 DIAGNOSIS — H31091 Other chorioretinal scars, right eye: Secondary | ICD-10-CM | POA: Diagnosis not present

## 2018-09-17 DIAGNOSIS — H2513 Age-related nuclear cataract, bilateral: Secondary | ICD-10-CM | POA: Diagnosis not present

## 2018-09-17 DIAGNOSIS — H3412 Central retinal artery occlusion, left eye: Secondary | ICD-10-CM | POA: Diagnosis not present

## 2018-11-09 ENCOUNTER — Encounter: Payer: Self-pay | Admitting: Adult Health

## 2018-11-09 ENCOUNTER — Ambulatory Visit (INDEPENDENT_AMBULATORY_CARE_PROVIDER_SITE_OTHER): Payer: Medicare Other | Admitting: Adult Health

## 2018-11-09 VITALS — BP 124/70 | Temp 98.5°F | Wt 202.0 lb

## 2018-11-09 DIAGNOSIS — I10 Essential (primary) hypertension: Secondary | ICD-10-CM | POA: Diagnosis not present

## 2018-11-09 DIAGNOSIS — Z23 Encounter for immunization: Secondary | ICD-10-CM | POA: Diagnosis not present

## 2018-11-09 DIAGNOSIS — I2510 Atherosclerotic heart disease of native coronary artery without angina pectoris: Secondary | ICD-10-CM

## 2018-11-09 DIAGNOSIS — F17200 Nicotine dependence, unspecified, uncomplicated: Secondary | ICD-10-CM

## 2018-11-09 DIAGNOSIS — Z7689 Persons encountering health services in other specified circumstances: Secondary | ICD-10-CM

## 2018-11-09 MED ORDER — BENAZEPRIL HCL 40 MG PO TABS: 40.0000 mg | ORAL_TABLET | Freq: Every day | ORAL | 0 refills | Status: DC

## 2018-11-09 MED ORDER — METOPROLOL SUCCINATE ER 100 MG PO TB24: ORAL_TABLET | ORAL | 3 refills | Status: DC

## 2018-11-09 MED ORDER — CHLORTHALIDONE 25 MG PO TABS: 25.0000 mg | ORAL_TABLET | Freq: Every day | ORAL | 3 refills | Status: DC

## 2018-11-09 MED ORDER — ATORVASTATIN CALCIUM 20 MG PO TABS: 20.0000 mg | ORAL_TABLET | Freq: Every day | ORAL | 3 refills | Status: DC

## 2018-11-09 MED ORDER — POTASSIUM CHLORIDE CRYS ER 20 MEQ PO TBCR: 20.0000 meq | EXTENDED_RELEASE_TABLET | Freq: Every day | ORAL | 3 refills | Status: DC

## 2018-11-09 NOTE — Patient Instructions (Addendum)
It was great seeing you today!  I am happy to have you on the team  Please follow up after march 12th for your physical

## 2018-11-09 NOTE — Progress Notes (Signed)
Patient presents to clinic today to establish care. He is a pleasant 78 year old male who  has a past medical history of ADENOCARCINOMA, PROSTATE (05/19/2010), BACK PAIN (07/16/2008), Carpal tunnel syndrome (10/15/2008), COLONIC POLYPS, HX OF (06/08/2007), Contusion, CVA (cerebral vascular accident) ([place]), ERECTILE DYSFUNCTION, ORGANIC (07/11/2007), Headache(784.0) (07/11/2007), HYPERTENSION (06/08/2007), INGUINAL HERNIA (12/04/2007), LEFT VENTRICULAR FUNCTION, DECREASED (11/12/2007), Prostate cancer ([place]) (09/14/2009), PROSTATE SPECIFIC ANTIGEN, ELEVATED (05/06/2009), and TOBACCO ABUSE (11/06/2009).  He is a former patient of Dr. Raliegh Ip. His last CPE was in 12/2017   Acute Concerns: Establish Care  Chronic Issues:  Essential hypertension-currently controlled with Lotensin 40 mg, chlorthalidone 25 mg daily, and Toprol 100 mg daily.  He does take a potassium supplement due to being on diuretic therapy  BP Readings from Last 3 Encounters:  11/09/18 124/70  05/09/18 (!) 150/70  07/06/17 110/62    Hyperlipidemia-takes aspirin 81 mg and Lipitor 20 mg daily  Lab Results  Component Value Date   CHOL 196 01/09/2018   HDL 54.50 01/09/2018   LDLCALC 122 (H) 01/09/2018   TRIG 94.0 01/09/2018   CHOLHDL 4 01/09/2018    Cental Retinal Artery occlusion & Glaucoma - is followed by opthalmology twice annually- Dr. Schuyler Amor. Was recently started on Lumigan   Tobacco Use - continues to smoke about 0.5 packs a day. He has tried in the past to quit cold [place].   H/O Prostate Cancer - had prostatectomy. He is followed by Urology yearly.  Health Maintenance: Dental -- Does not do routine care.  Vision -- routine  Immunizations -- Needs flu shot.  Colonoscopy -- last was in 04/2017   Past Medical History:  Diagnosis Date  . ADENOCARCINOMA, PROSTATE 05/19/2010  . BACK PAIN 07/16/2008  . Carpal tunnel syndrome 10/15/2008  . COLONIC POLYPS, HX OF 06/08/2007  . Contusion    left shoulder,left knee,s/p recent fall end  of march 2013  . CVA (cerebral vascular accident) ([place])   . ERECTILE DYSFUNCTION, ORGANIC 07/11/2007  . Headache(784.0) 07/11/2007  . HYPERTENSION 06/08/2007  . INGUINAL HERNIA 12/04/2007  . LEFT VENTRICULAR FUNCTION, DECREASED 11/12/2007  . Prostate cancer ([place]) 09/14/2009   prostateectomy,adenocarcinoma,gleason=4+3=7  . PROSTATE SPECIFIC ANTIGEN, ELEVATED 05/06/2009  . TOBACCO ABUSE 11/06/2009    Past Surgical History:  Procedure Laterality Date  . HERNIA REPAIR     right ingunial  . PROSTATE SURGERY  09/14/2009   radical prostatectomy /gleason=4+3-7    Current Outpatient Medications on File Prior to Visit  Medication Sig Dispense Refill  . acetaminophen (TYLENOL) 500 MG tablet Take 500 mg by mouth every 4 (four) hours as needed.    Marland Kitchen aspirin 81 MG tablet Take 1 tablet (81 mg total) by mouth daily. 30 tablet   . atorvastatin (LIPITOR) 20 MG tablet Take 1 tablet (20 mg total) by mouth daily. 90 tablet 3  . benazepril (LOTENSIN) 40 MG tablet TAKE 1 TABLET BY MOUTH ONCE DAILY 90 tablet 0  . chlorthalidone (HYGROTON) 25 MG tablet Take 1 tablet (25 mg total) by mouth daily. 90 tablet 3  . metoprolol succinate (TOPROL-XL) 100 MG 24 hr tablet TAKE ONE TABLET BY MOUTH ONCE DAILY TAKE  WITH  OR  IMMEDIATELY  FOLLOWING  A  MEAL 90 tablet 3  . potassium chloride SA (K-DUR,KLOR-CON) 20 MEQ tablet TAKE 1 TABLET BY MOUTH ONCE DAILY 90 tablet 0   No current facility-administered medications on file prior to visit.     No Known Allergies  Family History  Problem Relation Age of Onset  .  Colon cancer Neg Hx     Social History   Socioeconomic History  . Marital status: Married    Spouse name: Not on file  . Number of children: Not on file  . Years of education: Not on file  . Highest education level: Not on file  Occupational History    Employer: RETIRED    Comment: retired Games developer  Social Needs  . Financial resource strain: Not on file  . Food insecurity:    Worry: Not on file     Inability: Not on file  . Transportation needs:    Medical: Not on file    Non-medical: Not on file  Tobacco Use  . Smoking status: Current Every Day Smoker    Packs/day: 0.50    Years: 60.00    Pack years: 30.00    Types: Cigarettes  . Smokeless tobacco: Never Used  Substance and Sexual Activity  . Alcohol use: Not Currently  . Drug use: No  . Sexual activity: Not on file  Lifestyle  . Physical activity:    Days per week: Not on file    Minutes per session: Not on file  . Stress: Not on file  Relationships  . Social connections:    Talks on phone: Not on file    Gets together: Not on file    Attends religious service: Not on file    Active member of club or organization: Not on file    Attends meetings of clubs or organizations: Not on file    Relationship status: Not on file  . Intimate partner violence:    Fear of current or ex partner: Not on file    Emotionally abused: Not on file    Physically abused: Not on file    Forced sexual activity: Not on file  Other Topics Concern  . Not on file  Social History Narrative  . Not on file    Review of Systems  Constitutional: Negative.   HENT: Negative.   Eyes: Negative.   Respiratory: Negative.   Cardiovascular: Negative.   Gastrointestinal: Negative.   Genitourinary: Negative.   Musculoskeletal: Positive for joint pain (knees).  Skin: Negative.   Neurological: Negative.   Endo/Heme/Allergies: Negative.   Psychiatric/Behavioral: Negative.   All other systems reviewed and are negative.     BP 124/70   Temp 98.5 F (36.9 C)   Wt 202 lb (91.6 kg)   BMI 27.40 kg/m   Physical Exam Vitals signs and nursing note reviewed.  Constitutional:      General: He is not in acute distress.    Appearance: Normal appearance. He is well-developed and normal weight. He is not diaphoretic.  HENT:     Head: Normocephalic and atraumatic.     Right Ear: Tympanic membrane, ear canal and external ear normal. There is no  impacted cerumen.     Left Ear: Tympanic membrane, ear canal and external ear normal. There is no impacted cerumen.     Nose: Nose normal. No congestion or rhinorrhea.     Mouth/Throat:     Mouth: Mucous membranes are moist.     Pharynx: Oropharynx is clear. No oropharyngeal exudate or posterior oropharyngeal erythema.  Eyes:     Extraocular Movements: Extraocular movements intact.     Conjunctiva/sclera: Conjunctivae normal.     Pupils: Pupils are equal, round, and reactive to light.  Neck:     Thyroid: No thyromegaly.     Trachea: No tracheal deviation.  Cardiovascular:  Rate and Rhythm: Normal rate and regular rhythm.     Heart sounds: Normal heart sounds. No murmur. No friction rub. No gallop.   Pulmonary:     Effort: Pulmonary effort is normal. No respiratory distress.     Breath sounds: Normal breath sounds. No stridor. No wheezing, rhonchi or rales.  Chest:     Chest wall: No tenderness.  Abdominal:     Tenderness: There is no left CVA tenderness.  Musculoskeletal: Normal range of motion.        General: No swelling, tenderness, deformity or signs of injury.     Right lower leg: No edema.     Left lower leg: No edema.  Lymphadenopathy:     Cervical: No cervical adenopathy.  Skin:    General: Skin is warm and dry.     Coloration: Skin is not jaundiced or pale.     Findings: No bruising, erythema, lesion or rash.  Neurological:     General: No focal deficit present.     Mental Status: He is alert and oriented to person, place, and time. Mental status is at baseline.     Cranial Nerves: No cranial nerve deficit.     Coordination: Coordination normal.  Psychiatric:        Mood and Affect: Mood normal.        Behavior: Behavior normal.        Thought Content: Thought content normal.        Judgment: Judgment normal.    Assessment/Plan:  1. Encounter to establish care - Flu shot given  - Follow up in March for CPE or sooner if needed   2. TOBACCO ABUSE -  Encouraged to quit smoking  - Try using patch   3. Cardiovascular disease - No change in medication therapy   4. Essential hypertension - No change in medication therapy    [name], NP

## 2019-02-07 ENCOUNTER — Telehealth: Payer: Self-pay | Admitting: *Deleted

## 2019-02-07 NOTE — Telephone Encounter (Signed)
Copied from [place] [id number]. Topic: General - Other >> Feb 07, 2019  8:24 AM Carolyn Stare wrote:  Pt daughter call to say pt would to reschedule his appt for 02/12/2019  Phone number [phone] dial *25 so phone number will show up

## 2019-02-11 NOTE — Telephone Encounter (Signed)
Pt is no longer on schedule.

## 2019-02-12 ENCOUNTER — Encounter: Payer: Medicare Other | Admitting: Adult Health

## 2019-07-09 DIAGNOSIS — H31091 Other chorioretinal scars, right eye: Secondary | ICD-10-CM | POA: Diagnosis not present

## 2019-07-09 DIAGNOSIS — H3412 Central retinal artery occlusion, left eye: Secondary | ICD-10-CM | POA: Diagnosis not present

## 2019-07-09 DIAGNOSIS — H35033 Hypertensive retinopathy, bilateral: Secondary | ICD-10-CM | POA: Diagnosis not present

## 2019-07-09 DIAGNOSIS — H2513 Age-related nuclear cataract, bilateral: Secondary | ICD-10-CM | POA: Diagnosis not present

## 2019-07-09 DIAGNOSIS — H40053 Ocular hypertension, bilateral: Secondary | ICD-10-CM | POA: Diagnosis not present

## 2019-07-24 ENCOUNTER — Other Ambulatory Visit: Payer: Self-pay

## 2019-07-24 ENCOUNTER — Encounter: Payer: Self-pay | Admitting: Adult Health

## 2019-07-24 ENCOUNTER — Ambulatory Visit (INDEPENDENT_AMBULATORY_CARE_PROVIDER_SITE_OTHER): Payer: Medicare Other | Admitting: Adult Health

## 2019-07-24 VITALS — BP 140/90 | HR 75 | Temp 98.4°F | Ht 72.5 in | Wt 212.0 lb

## 2019-07-24 DIAGNOSIS — E1169 Type 2 diabetes mellitus with other specified complication: Secondary | ICD-10-CM | POA: Diagnosis not present

## 2019-07-24 DIAGNOSIS — I2510 Atherosclerotic heart disease of native coronary artery without angina pectoris: Secondary | ICD-10-CM | POA: Diagnosis not present

## 2019-07-24 DIAGNOSIS — F17200 Nicotine dependence, unspecified, uncomplicated: Secondary | ICD-10-CM | POA: Diagnosis not present

## 2019-07-24 DIAGNOSIS — Z23 Encounter for immunization: Secondary | ICD-10-CM | POA: Diagnosis not present

## 2019-07-24 DIAGNOSIS — I10 Essential (primary) hypertension: Secondary | ICD-10-CM | POA: Diagnosis not present

## 2019-07-24 DIAGNOSIS — B351 Tinea unguium: Secondary | ICD-10-CM | POA: Diagnosis not present

## 2019-07-24 DIAGNOSIS — E785 Hyperlipidemia, unspecified: Secondary | ICD-10-CM | POA: Diagnosis not present

## 2019-07-24 LAB — CBC WITH DIFFERENTIAL/PLATELET
Basophils Absolute: 0 10*3/uL (ref 0.0–0.1)
Basophils Relative: 0.5 % (ref 0.0–3.0)
Eosinophils Absolute: 0.3 10*3/uL (ref 0.0–0.7)
Eosinophils Relative: 7.7 % — ABNORMAL HIGH (ref 0.0–5.0)
HCT: 46.8 % (ref 39.0–52.0)
Hemoglobin: 15.2 g/dL (ref 13.0–17.0)
Lymphocytes Relative: 25.1 % (ref 12.0–46.0)
Lymphs Abs: 1 10*3/uL (ref 0.7–4.0)
MCHC: 32.5 g/dL (ref 30.0–36.0)
MCV: 90.6 fl (ref 78.0–100.0)
Monocytes Absolute: 0.4 10*3/uL (ref 0.1–1.0)
Monocytes Relative: 10.6 % (ref 3.0–12.0)
Neutro Abs: 2.3 10*3/uL (ref 1.4–7.7)
Neutrophils Relative %: 56.1 % (ref 43.0–77.0)
Platelets: 197 10*3/uL (ref 150.0–400.0)
RBC: 5.17 Mil/uL (ref 4.22–5.81)
RDW: 15.4 % (ref 11.5–15.5)
WBC: 4 10*3/uL (ref 4.0–10.5)

## 2019-07-24 LAB — LIPID PANEL
Cholesterol: 188 mg/dL (ref 0–200)
HDL: 46.3 mg/dL (ref >39.00)
LDL Cholesterol: 126 mg/dL — ABNORMAL HIGH (ref 0–99)
NonHDL: 141.21
Total CHOL/HDL Ratio: 4
Triglycerides: 76 mg/dL (ref 0.0–149.0)
VLDL: 15.2 mg/dL (ref 0.0–40.0)

## 2019-07-24 LAB — COMPREHENSIVE METABOLIC PANEL
ALT: 10 U/L (ref 0–53)
AST: 17 U/L (ref 0–37)
Albumin: 4.2 g/dL (ref 3.5–5.2)
Alkaline Phosphatase: 54 U/L (ref 39–117)
BUN: 9 mg/dL (ref 6–23)
CO2: 31 mEq/L (ref 19–32)
Calcium: 9.9 mg/dL (ref 8.4–10.5)
Chloride: 100 mEq/L (ref 96–112)
Creatinine, Ser: 1.02 mg/dL (ref 0.40–1.50)
GFR: 85.36 mL/min (ref >60.00)
Glucose, Bld: 71 mg/dL (ref 70–99)
Potassium: 4.3 mEq/L (ref 3.5–5.1)
Sodium: 141 mEq/L (ref 135–145)
Total Bilirubin: 1.5 mg/dL — ABNORMAL HIGH (ref 0.2–1.2)
Total Protein: 6.9 g/dL (ref 6.0–8.3)

## 2019-07-24 LAB — TSH: TSH: 1.26 u[IU]/mL (ref 0.35–4.50)

## 2019-07-24 NOTE — Patient Instructions (Signed)
It was great seeing you today   We will follow up with you regarding your blood work   Someone will call you from [facility] to schedule your appointment - please go and get those toenails taken care of   Please quit smoking - try using the patch

## 2019-07-24 NOTE — Progress Notes (Signed)
Subjective:    Patient ID: Tommy Bauer, male    DOB: [birth year], 78 y.o.   MRN: [id number]  HPI Patient presents for yearly preventative medicine examination. 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  Essential hypertension.  Currently controlled with Lotensin 40 mg, chlorthalidone 25 mg daily, and Toprol 100 mg daily.  He does take a potassium supplement due to being on a diuretic.  He denies dizziness, lightheadedness, chest pain, shortness of breath, or syncopal episodes BP Readings from Last 3 Encounters:  07/24/19 140/90  11/09/18 124/70  05/09/18 (!) 150/70   Hyperlipidemia-takes aspirin 81 mg and Lipitor 20 mg daily.  He denies myalgia or fatigue Lab Results  Component Value Date   CHOL 196 01/09/2018   HDL 54.50 01/09/2018   LDLCALC 122 (H) 01/09/2018   TRIG 94.0 01/09/2018   CHOLHDL 4 01/09/2018   H/O Prostate Cancer -had prostatectomy. Is followed by Urology yearly.   Central retinal artery occlusion and glaucoma-followed by ophthalmology twice a year, Dr. Katy Fitch.  Currently prescribed Lumigan  Tobacco Use -he continues to smoke atleast a pack a day.   He has tried in the past to quit cold [place] this was not successful. He knows he needs to quit smoking  Onychomycosis- was referred to podiatry in the past for long thick toenails. He never went and is now having chronic foot pain. He would like to be referred back   All immunizations and health maintenance protocols were reviewed with  the patient and needed orders were placed.  Appropriate screening laboratory values were ordered for the patient including screening of hyperlipidemia, renal function and hepatic function.  Medication reconciliation,  past medical history, social history, problem list and allergies were reviewed in detail with the patient  Goals were established with regard to weight loss, exercise, and  diet in compliance with medications  End of life planning was discussed.  Review of Systems  Constitutional: Negative.   HENT: Negative.   Eyes: Negative.   Respiratory: Negative.   Cardiovascular: Negative.   Gastrointestinal: Negative.   Endocrine: Negative.   Genitourinary: Negative.   Musculoskeletal: Positive for arthralgias.  Allergic/Immunologic: Negative.   Neurological: Negative.   Hematological: Negative.   Psychiatric/Behavioral: Negative.    Past Medical History:  Diagnosis Date   ADENOCARCINOMA, PROSTATE 05/19/2010   BACK PAIN 07/16/2008   Carpal tunnel syndrome 10/15/2008   COLONIC POLYPS, HX OF 06/08/2007   Contusion    left shoulder,left knee,s/p recent fall end of march 2013   CVA (cerebral vascular accident) ([place])    ERECTILE DYSFUNCTION, ORGANIC 07/11/2007   Headache(784.0) 07/11/2007   HYPERTENSION 06/08/2007   INGUINAL HERNIA 12/04/2007   LEFT VENTRICULAR FUNCTION, DECREASED 11/12/2007   Prostate cancer ([place]) 09/14/2009   prostateectomy,adenocarcinoma,gleason=4+3=7   PROSTATE SPECIFIC ANTIGEN, ELEVATED 05/06/2009   TOBACCO ABUSE 11/06/2009    Social History   Socioeconomic History   Marital status: Married    Spouse name: Not on file   Number of children: Not on file   Years of education: Not on file   Highest education level: Not on file  Occupational History    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   Food insecurity    Worry: Not on file    Inability: Not on Lexicographer  needs    Medical: Not on file    Non-medical: Not on file  Tobacco Use   Smoking status: Current Every Day Smoker    Packs/day: 0.50    Years: 60.00    Pack years: 30.00    Types: Cigarettes   Smokeless tobacco: Never Used  Substance and Sexual Activity   Alcohol use: Not Currently   Drug use: No   Sexual activity: Not on file  Lifestyle   Physical activity    Days per week: Not on file    Minutes per session: Not on file   Stress: Not on file  Relationships   Social connections    Talks on phone: Not on file    Gets together: Not on file    Attends religious service: Not on file    Active member of club or organization: Not on file    Attends meetings of clubs or organizations: Not on file    Relationship status: Not on file   Intimate partner violence    Fear of current or ex partner: Not on file    Emotionally abused: Not on file    Physically abused: Not on file    Forced sexual activity: Not on file  Other Topics Concern   Not on file  Social History Narrative   Retired Games developer    Married for 54 years but legally separated.     Past Surgical History:  Procedure Laterality Date   HERNIA REPAIR     right ingunial   PROSTATE SURGERY  09/14/2009   radical prostatectomy /gleason=4+3-7    Family History  Problem Relation Age of Onset   Colon cancer Neg Hx     No Known Allergies  Current Outpatient Medications on File Prior to Visit  Medication Sig Dispense Refill   acetaminophen (TYLENOL) 500 MG tablet Take 500 mg by mouth every 4 (four) hours as needed.     aspirin 81 MG tablet Take 1 tablet (81 mg total) by mouth daily. 30 tablet    atorvastatin (LIPITOR) 20 MG tablet Take 1 tablet (20 mg total) by mouth daily. 90 tablet 3   benazepril (LOTENSIN) 40 MG tablet Take 1 tablet (40 mg total) by mouth daily. 90 tablet 0   bimatoprost (LUMIGAN) 0.03 % ophthalmic solution Place 1 drop into both eyes at bedtime.     chlorthalidone (HYGROTON)  25 MG tablet Take 1 tablet (25 mg total) by mouth daily. 90 tablet 3   metoprolol succinate (TOPROL-XL) 100 MG 24 hr tablet TAKE ONE TABLET BY MOUTH ONCE DAILY TAKE  WITH  OR  IMMEDIATELY  FOLLOWING  A  MEAL 90 tablet 3   potassium chloride SA (K-DUR,KLOR-CON) 20 MEQ tablet Take 1 tablet (20 mEq total) by mouth daily. 90 tablet 3   No current facility-administered medications on file prior to visit.     BP 140/90 (BP Location: Left Arm, Patient Position: Sitting, Cuff Size: Large)    Pulse 75    Temp 98.4 F (36.9 C) (Temporal)    Ht 6' 0.5" (1.842 m)    Wt 212 lb (96.2 kg)    SpO2 96%    BMI 28.36 kg/m       Objective:   Physical Exam Vitals signs and nursing note reviewed.  Constitutional:  Appearance: Normal appearance.  HENT:     Head: Normocephalic and atraumatic.     Right Ear: Tympanic membrane, ear canal and external ear normal. There is no impacted cerumen.     Left Ear: Tympanic membrane, ear canal and external ear normal. There is no impacted cerumen.     Nose: Nose normal. No congestion or rhinorrhea.     Mouth/Throat:     Mouth: Mucous membranes are moist.     Dentition: Abnormal dentition.     Pharynx: Oropharynx is clear. No oropharyngeal exudate or posterior oropharyngeal erythema.  Eyes:     General: No scleral icterus.       Right eye: No discharge.        Left eye: No discharge.     Extraocular Movements: Extraocular movements intact.     Conjunctiva/sclera: Conjunctivae normal.     Pupils: Pupils are equal, round, and reactive to light.  Cardiovascular:     Rate and Rhythm: Normal rate and regular rhythm.     Pulses: Normal pulses.     Heart sounds: Normal heart sounds. No murmur. No friction rub. No gallop.   Pulmonary:     Effort: Pulmonary effort is normal. No respiratory distress.     Breath sounds: Normal breath sounds. No stridor. No wheezing (trace throughout ), rhonchi or rales.  Chest:     Chest wall: No tenderness.  Abdominal:      General: Abdomen is flat. Bowel sounds are normal. There is no distension.     Palpations: Abdomen is soft. There is no mass.     Tenderness: There is no abdominal tenderness. There is no right CVA tenderness, left CVA tenderness, guarding or rebound.     Hernia: No hernia is present.  Musculoskeletal: Normal range of motion.        General: No swelling, tenderness, deformity or signs of injury.     Right lower leg: No edema.     Left lower leg: No edema.  Skin:    General: Skin is warm and dry.     Capillary Refill: Capillary refill takes less than 2 seconds.     Coloration: Skin is not jaundiced or pale.     Findings: No bruising, erythema, lesion or rash.     Comments: Very long and thick toenails on all toes bilaterally.   Neurological:     General: No focal deficit present.     Mental Status: He is alert and oriented to person, place, and time. Mental status is at baseline.     Cranial Nerves: No cranial nerve deficit.     Sensory: No sensory deficit.     Motor: No weakness.     Coordination: Coordination normal.     Gait: Gait normal.     Deep Tendon Reflexes: Reflexes normal.  Psychiatric:        Mood and Affect: Mood normal.        Behavior: Behavior normal.        Thought Content: Thought content normal.        Judgment: Judgment normal.        Assessment & Plan:  1. Essential hypertension - No change in medication - CBC with Differential/Platelet - Comprehensive metabolic panel - Lipid panel - TSH  2. TOBACCO ABUSE - Encouraged to quit smoking  - try using patch   3. Hyperlipidemia associated with type 2 diabetes mellitus ([place]) - consider increase statin - CBC with Differential/Platelet - Comprehensive metabolic panel - Lipid panel -  TSH  4. Onychomycosis  - Ambulatory referral to Podiatry  5. Need for influenza vaccination  - Flu Vaccine QUAD High Dose(Fluad)  [name], NP

## 2019-07-30 ENCOUNTER — Other Ambulatory Visit: Payer: Self-pay | Admitting: Family Medicine

## 2019-07-30 MED ORDER — ATORVASTATIN CALCIUM 40 MG PO TABS: 40.0000 mg | ORAL_TABLET | Freq: Every day | ORAL | 1 refills | Status: AC

## 2019-07-30 NOTE — Telephone Encounter (Signed)
Sent to the pharmacy by e-scribe. 

## 2019-08-16 DIAGNOSIS — Z8546 Personal history of malignant neoplasm of prostate: Secondary | ICD-10-CM | POA: Diagnosis not present

## 2019-08-20 DIAGNOSIS — H2513 Age-related nuclear cataract, bilateral: Secondary | ICD-10-CM | POA: Diagnosis not present

## 2019-08-20 DIAGNOSIS — H40053 Ocular hypertension, bilateral: Secondary | ICD-10-CM | POA: Diagnosis not present

## 2019-08-20 DIAGNOSIS — H0014 Chalazion left upper eyelid: Secondary | ICD-10-CM | POA: Diagnosis not present

## 2019-08-20 LAB — PSA: PSA: <0.015

## 2019-08-23 DIAGNOSIS — Z8546 Personal history of malignant neoplasm of prostate: Secondary | ICD-10-CM | POA: Diagnosis not present

## 2019-08-28 ENCOUNTER — Encounter: Payer: Self-pay | Admitting: Family Medicine

## 2019-11-26 DIAGNOSIS — H0014 Chalazion left upper eyelid: Secondary | ICD-10-CM | POA: Diagnosis not present

## 2019-12-04 DIAGNOSIS — H0014 Chalazion left upper eyelid: Secondary | ICD-10-CM | POA: Diagnosis not present

## 2020-01-21 ENCOUNTER — Ambulatory Visit: Payer: Medicare Other | Admitting: Adult Health

## 2020-05-19 DIAGNOSIS — Z23 Encounter for immunization: Secondary | ICD-10-CM | POA: Diagnosis not present

## 2020-06-09 DIAGNOSIS — Z23 Encounter for immunization: Secondary | ICD-10-CM | POA: Diagnosis not present

## 2020-08-12 DIAGNOSIS — Z8546 Personal history of malignant neoplasm of prostate: Secondary | ICD-10-CM | POA: Diagnosis not present

## 2020-08-12 LAB — PSA: PSA: <0.015

## 2020-08-19 DIAGNOSIS — Z8546 Personal history of malignant neoplasm of prostate: Secondary | ICD-10-CM | POA: Diagnosis not present

## 2020-08-25 ENCOUNTER — Encounter: Payer: Self-pay | Admitting: Adult Health

## 2020-09-13 ENCOUNTER — Emergency Department (HOSPITAL_COMMUNITY): Payer: Medicare Other

## 2020-09-13 ENCOUNTER — Other Ambulatory Visit: Payer: Self-pay

## 2020-09-13 ENCOUNTER — Inpatient Hospital Stay (HOSPITAL_COMMUNITY)
Admission: EM | Admit: 2020-09-13 | Discharge: 2020-09-17 | DRG: 947 | Disposition: A | Discharge status: Skilled Nursing Facility | Payer: Medicare Other | Attending: Family Medicine | Admitting: Family Medicine

## 2020-09-13 DIAGNOSIS — H3412 Central retinal artery occlusion, left eye: Secondary | ICD-10-CM | POA: Diagnosis not present

## 2020-09-13 DIAGNOSIS — R6520 Severe sepsis without septic shock: Secondary | ICD-10-CM

## 2020-09-13 DIAGNOSIS — M6281 Muscle weakness (generalized): Secondary | ICD-10-CM | POA: Diagnosis not present

## 2020-09-13 DIAGNOSIS — Z86010 Personal history of colonic polyps: Secondary | ICD-10-CM

## 2020-09-13 DIAGNOSIS — Z043 Encounter for examination and observation following other accident: Secondary | ICD-10-CM | POA: Diagnosis not present

## 2020-09-13 DIAGNOSIS — B9689 Other specified bacterial agents as the cause of diseases classified elsewhere: Secondary | ICD-10-CM | POA: Diagnosis present

## 2020-09-13 DIAGNOSIS — G9341 Metabolic encephalopathy: Secondary | ICD-10-CM | POA: Diagnosis present

## 2020-09-13 DIAGNOSIS — G5600 Carpal tunnel syndrome, unspecified upper limb: Secondary | ICD-10-CM | POA: Diagnosis present

## 2020-09-13 DIAGNOSIS — F17210 Nicotine dependence, cigarettes, uncomplicated: Secondary | ICD-10-CM | POA: Diagnosis present

## 2020-09-13 DIAGNOSIS — G9340 Encephalopathy, unspecified: Secondary | ICD-10-CM

## 2020-09-13 DIAGNOSIS — I10 Essential (primary) hypertension: Secondary | ICD-10-CM | POA: Diagnosis present

## 2020-09-13 DIAGNOSIS — R4182 Altered mental status, unspecified: Secondary | ICD-10-CM

## 2020-09-13 DIAGNOSIS — M6282 Rhabdomyolysis: Secondary | ICD-10-CM | POA: Diagnosis present

## 2020-09-13 DIAGNOSIS — Z8546 Personal history of malignant neoplasm of prostate: Secondary | ICD-10-CM

## 2020-09-13 DIAGNOSIS — E785 Hyperlipidemia, unspecified: Secondary | ICD-10-CM | POA: Diagnosis not present

## 2020-09-13 DIAGNOSIS — E860 Dehydration: Secondary | ICD-10-CM | POA: Diagnosis present

## 2020-09-13 DIAGNOSIS — Z79899 Other long term (current) drug therapy: Secondary | ICD-10-CM

## 2020-09-13 DIAGNOSIS — W19XXXA Unspecified fall, initial encounter: Secondary | ICD-10-CM | POA: Diagnosis not present

## 2020-09-13 DIAGNOSIS — Z9079 Acquired absence of other genital organ(s): Secondary | ICD-10-CM | POA: Diagnosis not present

## 2020-09-13 DIAGNOSIS — N529 Male erectile dysfunction, unspecified: Secondary | ICD-10-CM | POA: Diagnosis present

## 2020-09-13 DIAGNOSIS — R319 Hematuria, unspecified: Secondary | ICD-10-CM | POA: Diagnosis present

## 2020-09-13 DIAGNOSIS — A419 Sepsis, unspecified organism: Secondary | ICD-10-CM | POA: Diagnosis present

## 2020-09-13 DIAGNOSIS — H1032 Unspecified acute conjunctivitis, left eye: Secondary | ICD-10-CM | POA: Diagnosis not present

## 2020-09-13 DIAGNOSIS — Z8673 Personal history of transient ischemic attack (TIA), and cerebral infarction without residual deficits: Secondary | ICD-10-CM

## 2020-09-13 DIAGNOSIS — R531 Weakness: Secondary | ICD-10-CM | POA: Diagnosis not present

## 2020-09-13 DIAGNOSIS — D696 Thrombocytopenia, unspecified: Secondary | ICD-10-CM | POA: Diagnosis present

## 2020-09-13 DIAGNOSIS — H3410 Central retinal artery occlusion, unspecified eye: Secondary | ICD-10-CM | POA: Diagnosis present

## 2020-09-13 DIAGNOSIS — I361 Nonrheumatic tricuspid (valve) insufficiency: Secondary | ICD-10-CM | POA: Diagnosis not present

## 2020-09-13 DIAGNOSIS — Z8616 Personal history of COVID-19: Secondary | ICD-10-CM | POA: Diagnosis not present

## 2020-09-13 DIAGNOSIS — Z20822 Contact with and (suspected) exposure to covid-19: Secondary | ICD-10-CM | POA: Diagnosis present

## 2020-09-13 DIAGNOSIS — J341 Cyst and mucocele of nose and nasal sinus: Secondary | ICD-10-CM | POA: Diagnosis not present

## 2020-09-13 DIAGNOSIS — Z7982 Long term (current) use of aspirin: Secondary | ICD-10-CM | POA: Diagnosis not present

## 2020-09-13 DIAGNOSIS — L853 Xerosis cutis: Secondary | ICD-10-CM | POA: Diagnosis not present

## 2020-09-13 DIAGNOSIS — M2550 Pain in unspecified joint: Secondary | ICD-10-CM | POA: Diagnosis not present

## 2020-09-13 DIAGNOSIS — R000 Tachycardia, unspecified: Secondary | ICD-10-CM | POA: Diagnosis not present

## 2020-09-13 DIAGNOSIS — N179 Acute kidney failure, unspecified: Secondary | ICD-10-CM | POA: Diagnosis present

## 2020-09-13 DIAGNOSIS — L89890 Pressure ulcer of other site, unstageable: Secondary | ICD-10-CM | POA: Diagnosis not present

## 2020-09-13 DIAGNOSIS — R680 Hypothermia, not associated with low environmental temperature: Principal | ICD-10-CM | POA: Diagnosis present

## 2020-09-13 DIAGNOSIS — G319 Degenerative disease of nervous system, unspecified: Secondary | ICD-10-CM | POA: Diagnosis not present

## 2020-09-13 DIAGNOSIS — R7881 Bacteremia: Secondary | ICD-10-CM | POA: Diagnosis present

## 2020-09-13 DIAGNOSIS — T68XXXA Hypothermia, initial encounter: Secondary | ICD-10-CM | POA: Diagnosis present

## 2020-09-13 DIAGNOSIS — R296 Repeated falls: Secondary | ICD-10-CM | POA: Diagnosis not present

## 2020-09-13 DIAGNOSIS — U071 COVID-19: Secondary | ICD-10-CM | POA: Diagnosis not present

## 2020-09-13 DIAGNOSIS — Z7401 Bed confinement status: Secondary | ICD-10-CM | POA: Diagnosis not present

## 2020-09-13 DIAGNOSIS — E872 Acidosis: Secondary | ICD-10-CM | POA: Diagnosis present

## 2020-09-13 DIAGNOSIS — R2689 Other abnormalities of gait and mobility: Secondary | ICD-10-CM | POA: Diagnosis not present

## 2020-09-13 DIAGNOSIS — R9401 Abnormal electroencephalogram [EEG]: Secondary | ICD-10-CM | POA: Diagnosis present

## 2020-09-13 DIAGNOSIS — L89893 Pressure ulcer of other site, stage 3: Secondary | ICD-10-CM | POA: Diagnosis not present

## 2020-09-13 DIAGNOSIS — H109 Unspecified conjunctivitis: Secondary | ICD-10-CM | POA: Diagnosis present

## 2020-09-13 DIAGNOSIS — T796XXA Traumatic ischemia of muscle, initial encounter: Secondary | ICD-10-CM

## 2020-09-13 DIAGNOSIS — T68XXXD Hypothermia, subsequent encounter: Secondary | ICD-10-CM | POA: Diagnosis not present

## 2020-09-13 DIAGNOSIS — Z720 Tobacco use: Secondary | ICD-10-CM | POA: Diagnosis not present

## 2020-09-13 DIAGNOSIS — R7303 Prediabetes: Secondary | ICD-10-CM | POA: Diagnosis not present

## 2020-09-13 DIAGNOSIS — R600 Localized edema: Secondary | ICD-10-CM | POA: Diagnosis not present

## 2020-09-13 DIAGNOSIS — R5381 Other malaise: Secondary | ICD-10-CM | POA: Diagnosis not present

## 2020-09-13 LAB — URINALYSIS, ROUTINE W REFLEX MICROSCOPIC
Bacteria, UA: NONE SEEN
Bilirubin Urine: NEGATIVE
Glucose, UA: NEGATIVE mg/dL
Ketones, ur: NEGATIVE mg/dL
Leukocytes,Ua: NEGATIVE
Nitrite: NEGATIVE
Protein, ur: 100 mg/dL — AB
Specific Gravity, Urine: 1.025 (ref 1.005–1.030)
pH: 5 (ref 5.0–8.0)

## 2020-09-13 LAB — LACTIC ACID, PLASMA
Lactic Acid, Venous: 5.2 mmol/L (ref 0.5–1.9)
Lactic Acid, Venous: 5.8 mmol/L (ref 0.5–1.9)
Lactic Acid, Venous: 2.4 mmol/L (ref 0.5–1.9)

## 2020-09-13 LAB — COMPREHENSIVE METABOLIC PANEL
ALT: 39 U/L (ref 0–44)
AST: 61 U/L — ABNORMAL HIGH (ref 15–41)
Albumin: 3.1 g/dL — ABNORMAL LOW (ref 3.5–5.0)
Alkaline Phosphatase: 43 U/L (ref 38–126)
Anion gap: 14 (ref 5–15)
BUN: 53 mg/dL — ABNORMAL HIGH (ref 8–23)
CO2: 29 mmol/L (ref 22–32)
Calcium: 9.1 mg/dL (ref 8.9–10.3)
Chloride: 101 mmol/L (ref 98–111)
Creatinine, Ser: 1.41 mg/dL — ABNORMAL HIGH (ref 0.61–1.24)
GFR, Estimated: 51 mL/min — ABNORMAL LOW (ref >60)
Glucose, Bld: 160 mg/dL — ABNORMAL HIGH (ref 70–99)
Potassium: 4.2 mmol/L (ref 3.5–5.1)
Sodium: 144 mmol/L (ref 135–145)
Total Bilirubin: 1.3 mg/dL — ABNORMAL HIGH (ref 0.3–1.2)
Total Protein: 7.7 g/dL (ref 6.5–8.1)

## 2020-09-13 LAB — CBC WITH DIFFERENTIAL/PLATELET
Abs Immature Granulocytes: 0.56 10*3/uL — ABNORMAL HIGH (ref 0.00–0.07)
Basophils Absolute: 0.1 10*3/uL (ref 0.0–0.1)
Basophils Relative: 0 %
Eosinophils Absolute: 0 10*3/uL (ref 0.0–0.5)
Eosinophils Relative: 0 %
HCT: 62.1 % — ABNORMAL HIGH (ref 39.0–52.0)
Hemoglobin: 19.9 g/dL — ABNORMAL HIGH (ref 13.0–17.0)
Immature Granulocytes: 3 %
Lymphocytes Relative: 5 %
Lymphs Abs: 0.9 10*3/uL (ref 0.7–4.0)
MCH: 29.6 pg (ref 26.0–34.0)
MCHC: 32 g/dL (ref 30.0–36.0)
MCV: 92.3 fL (ref 80.0–100.0)
Monocytes Absolute: 1.6 10*3/uL — ABNORMAL HIGH (ref 0.1–1.0)
Monocytes Relative: 8 %
Neutro Abs: 15.6 10*3/uL — ABNORMAL HIGH (ref 1.7–7.7)
Neutrophils Relative %: 84 %
Platelets: 176 10*3/uL (ref 150–400)
RBC: 6.73 MIL/uL — ABNORMAL HIGH (ref 4.22–5.81)
RDW: 14.7 % (ref 11.5–15.5)
WBC: 18.8 10*3/uL — ABNORMAL HIGH (ref 4.0–10.5)
nRBC: 0 % (ref 0.0–0.2)

## 2020-09-13 LAB — TROPONIN I (HIGH SENSITIVITY)
Troponin I (High Sensitivity): 43 ng/L — ABNORMAL HIGH (ref <18)
Troponin I (High Sensitivity): 45 ng/L — ABNORMAL HIGH (ref <18)

## 2020-09-13 LAB — MAGNESIUM: Magnesium: 3 mg/dL — ABNORMAL HIGH (ref 1.7–2.4)

## 2020-09-13 LAB — PROTIME-INR
INR: 1.3 — ABNORMAL HIGH (ref 0.8–1.2)
Prothrombin Time: 15.9 seconds — ABNORMAL HIGH (ref 11.4–15.2)

## 2020-09-13 LAB — TSH: TSH: 0.725 u[IU]/mL (ref 0.350–4.500)

## 2020-09-13 LAB — RESP PANEL BY RT PCR (RSV, FLU A&B, COVID)
Influenza A by PCR: NEGATIVE
Influenza B by PCR: NEGATIVE
Respiratory Syncytial Virus by PCR: NEGATIVE
SARS Coronavirus 2 by RT PCR: NEGATIVE

## 2020-09-13 LAB — LIPASE, BLOOD: Lipase: 27 U/L (ref 11–51)

## 2020-09-13 LAB — CBG MONITORING, ED: Glucose-Capillary: 99 mg/dL (ref 70–99)

## 2020-09-13 LAB — CK: Total CK: 1836 U/L — ABNORMAL HIGH (ref 49–397)

## 2020-09-13 LAB — APTT: aPTT: <20 seconds — ABNORMAL LOW (ref 24–36)

## 2020-09-13 LAB — ETHANOL: Alcohol, Ethyl (B): <10 mg/dL (ref <10)

## 2020-09-13 MED ORDER — ACETAMINOPHEN 650 MG RE SUPP: 650.0000 mg | Freq: Four times a day (QID) | RECTAL | Status: DC | PRN

## 2020-09-13 MED ORDER — SODIUM CHLORIDE 0.9 % IV SOLN: 2.0000 g | Freq: Once | INTRAVENOUS | Status: DC

## 2020-09-13 MED ORDER — VANCOMYCIN HCL IN DEXTROSE 1-5 GM/200ML-% IV SOLN
1000.0000 mg | Freq: Once | INTRAVENOUS | Status: AC
  Administered 2020-09-13: 1000 mg via INTRAVENOUS
  Filled 2020-09-13: qty 200

## 2020-09-13 MED ORDER — ACETAMINOPHEN 325 MG PO TABS: 650.0000 mg | ORAL_TABLET | Freq: Four times a day (QID) | ORAL | Status: DC | PRN

## 2020-09-13 MED ORDER — METRONIDAZOLE IN NACL 5-0.79 MG/ML-% IV SOLN
500.0000 mg | Freq: Once | INTRAVENOUS | Status: AC
  Administered 2020-09-13: 500 mg via INTRAVENOUS
  Filled 2020-09-13: qty 100

## 2020-09-13 MED ORDER — VANCOMYCIN HCL IN DEXTROSE 1-5 GM/200ML-% IV SOLN: 1000.0000 mg | Freq: Once | INTRAVENOUS | Status: DC

## 2020-09-13 MED ORDER — GUAIFENESIN-DM 100-10 MG/5ML PO SYRP: 5.0000 mL | ORAL_SOLUTION | ORAL | Status: DC | PRN

## 2020-09-13 MED ORDER — ENOXAPARIN SODIUM 40 MG/0.4ML ~~LOC~~ SOLN
40.0000 mg | SUBCUTANEOUS | Status: DC
  Administered 2020-09-13 – 2020-09-16 (×4): 40 mg via SUBCUTANEOUS
  Filled 2020-09-13 (×4): qty 0.4

## 2020-09-13 MED ORDER — LACTATED RINGERS IV SOLN: INTRAVENOUS | Status: DC

## 2020-09-13 MED ORDER — ATORVASTATIN CALCIUM 40 MG PO TABS
40.0000 mg | ORAL_TABLET | Freq: Every day | ORAL | Status: DC
  Administered 2020-09-13 – 2020-09-15 (×3): 40 mg via ORAL
  Filled 2020-09-13 (×3): qty 1

## 2020-09-13 MED ORDER — VANCOMYCIN HCL 750 MG/150ML IV SOLN
750.0000 mg | Freq: Two times a day (BID) | INTRAVENOUS | Status: DC
  Administered 2020-09-14 – 2020-09-15 (×3): 750 mg via INTRAVENOUS
  Filled 2020-09-13 (×6): qty 150

## 2020-09-13 MED ORDER — LACTATED RINGERS IV BOLUS
1000.0000 mL | Freq: Once | INTRAVENOUS | Status: AC
  Administered 2020-09-13: 1000 mL via INTRAVENOUS

## 2020-09-13 MED ORDER — SODIUM CHLORIDE 0.9 % IV SOLN: INTRAVENOUS | Status: AC

## 2020-09-13 MED ORDER — METOPROLOL SUCCINATE ER 100 MG PO TB24
100.0000 mg | ORAL_TABLET | Freq: Every day | ORAL | Status: DC
  Administered 2020-09-13 – 2020-09-17 (×5): 100 mg via ORAL
  Filled 2020-09-13 (×5): qty 1

## 2020-09-13 MED ORDER — ERYTHROMYCIN 5 MG/GM OP OINT
TOPICAL_OINTMENT | Freq: Four times a day (QID) | OPHTHALMIC | Status: DC
  Filled 2020-09-13: qty 1

## 2020-09-13 MED ORDER — ASPIRIN EC 81 MG PO TBEC
81.0000 mg | DELAYED_RELEASE_TABLET | Freq: Every day | ORAL | Status: DC
  Administered 2020-09-13 – 2020-09-17 (×5): 81 mg via ORAL
  Filled 2020-09-13 (×5): qty 1

## 2020-09-13 MED ORDER — SODIUM CHLORIDE 0.9 % IV SOLN
2.0000 g | Freq: Two times a day (BID) | INTRAVENOUS | Status: DC
  Administered 2020-09-13: 2 g via INTRAVENOUS
  Filled 2020-09-13 (×2): qty 2

## 2020-09-13 MED ORDER — SODIUM CHLORIDE 0.9 % IV SOLN
2.0000 g | Freq: Once | INTRAVENOUS | Status: AC
  Administered 2020-09-13: 2 g via INTRAVENOUS
  Filled 2020-09-13: qty 2

## 2020-09-13 MED ORDER — LACTATED RINGERS IV BOLUS (SEPSIS)
1000.0000 mL | Freq: Once | INTRAVENOUS | Status: AC
  Administered 2020-09-13: 1000 mL via INTRAVENOUS

## 2020-09-13 MED ORDER — SODIUM CHLORIDE 0.9% FLUSH
3.0000 mL | Freq: Two times a day (BID) | INTRAVENOUS | Status: DC
  Administered 2020-09-13 – 2020-09-16 (×3): 3 mL via INTRAVENOUS

## 2020-09-13 MED ORDER — POLYETHYLENE GLYCOL 3350 17 G PO PACK: 17.0000 g | PACK | Freq: Every day | ORAL | Status: DC | PRN

## 2020-09-13 NOTE — ED Notes (Signed)
C collar placed. PA at bedside.

## 2020-09-13 NOTE — ED Provider Notes (Signed)
[facility] DEPT Provider Note   CSN: [id number] Arrival date & time: 09/13/20  1019     History Chief Complaint  Patient presents with  . Altered Mental Status    Tommy Bauer is a 79 y.o. male history of CVA, hypertension, prostate cancer.  History obtained by triage RN: Patient brought in by EMS today after his ex-wife found him on the floor.  Last seen 3 days ago.  Patient was confused and unable to tell why he was on the ground.  Normally patient is ambulatory.  Possible abdominal discoloration were patient was laying, no complaints per patient.  Patient covered in urine.   History obtained from patient's ex-wife: She goes to check on him every few days to make sure he is all right.  He does have a history of alcohol use but has never been altered or head injuries before.  She reports that 3 days ago she was knocking on his door and he did not answer, she called 102 and the police came and knocked on his door he eventually walked to the door reported he was fine and they left him alone.  She went to go check up on him again today but he did not answer the door, his front door was open but the screen door was locked and it was cold in his house.  First responders found patient laying in his bedroom on his right side and brought patient to this ER.  On my initial examination patient is in no acute distress.  He has some swelling to the right of his body which appears dependent and skin breakdown to multiple areas but no deep lesions.  He denies any pain denies headache, neck pain, chest pain, abdominal pain, extremity pain numbness/tingling, weakness or any additional concerns.  He is alert to himself, place and time but he does not recall why he was on the floor or how long he was on the floor.  Level 5 caveat altered mental status. HPI     Past Medical History:  Diagnosis Date  . ADENOCARCINOMA, PROSTATE 05/19/2010  . BACK PAIN 07/16/2008  . Carpal  tunnel syndrome 10/15/2008  . COLONIC POLYPS, HX OF 06/08/2007  . Contusion    left shoulder,left knee,s/p recent fall end of march 2013  . CVA (cerebral vascular accident) ([place])   . ERECTILE DYSFUNCTION, ORGANIC 07/11/2007  . Headache(784.0) 07/11/2007  . HYPERTENSION 06/08/2007  . INGUINAL HERNIA 12/04/2007  . LEFT VENTRICULAR FUNCTION, DECREASED 11/12/2007  . Prostate cancer ([place]) 09/14/2009   prostateectomy,adenocarcinoma,gleason=4+3=7  . PROSTATE SPECIFIC ANTIGEN, ELEVATED 05/06/2009  . TOBACCO ABUSE 11/06/2009    Patient Active Problem List   Diagnosis Date Noted  . Sepsis ([place]) 09/13/2020  . Central retinal artery occlusion 07/15/2015  . 79 year-old gentleman s/p radical prostatectomy for pT3a adenocarcinoma with a Gleason's Score of 4+3, with extracapsular extension noted on pathology, and rising PSA of 0.36 05/19/2010  . TOBACCO ABUSE 11/06/2009  . CARPAL TUNNEL SYNDROME 10/15/2008  . BACK PAIN 07/16/2008  . INGUINAL HERNIA 12/04/2007  . Cardiovascular disease 11/12/2007  . ERECTILE DYSFUNCTION, ORGANIC 07/11/2007  . HEADACHE 07/11/2007  . Essential hypertension 06/08/2007  . History of colonic polyps 06/08/2007    Past Surgical History:  Procedure Laterality Date  . HERNIA REPAIR     right ingunial  . PROSTATE SURGERY  09/14/2009   radical prostatectomy /gleason=4+3-7       Family History  Problem Relation Age of Onset  . Colon cancer Neg  Hx     Social History   Tobacco Use  . Smoking status: Current Every Day Smoker    Packs/day: 0.50    Years: 60.00    Pack years: 30.00    Types: Cigarettes  . Smokeless tobacco: Never Used  Substance Use Topics  . Alcohol use: Not Currently  . Drug use: No    Home Medications Prior to Admission medications   Medication Sig Start Date End Date Taking? Authorizing Provider  acetaminophen (TYLENOL) 500 MG tablet Take 500 mg by mouth every 4 (four) hours as needed.    [provider]  aspirin 81 MG tablet Take 1  tablet (81 mg total) by mouth daily. 07/15/15   Marletta Lor, MD  atorvastatin (LIPITOR) 40 MG tablet Take 1 tablet (40 mg total) by mouth daily. 07/30/19   Nafziger, Tommi Rumps, NP  benazepril (LOTENSIN) 40 MG tablet Take 1 tablet (40 mg total) by mouth daily. 11/09/18   Nafziger, Tommi Rumps, NP  bimatoprost (LUMIGAN) 0.03 % ophthalmic solution Place 1 drop into both eyes at bedtime.    [provider]  chlorthalidone (HYGROTON) 25 MG tablet Take 1 tablet (25 mg total) by mouth daily. 11/09/18   Nafziger, Tommi Rumps, NP  metoprolol succinate (TOPROL-XL) 100 MG 24 hr tablet TAKE ONE TABLET BY MOUTH ONCE DAILY TAKE  WITH  OR  IMMEDIATELY  FOLLOWING  A  MEAL 11/09/18   Nafziger, Tommi Rumps, NP  potassium chloride SA (K-DUR,KLOR-CON) 20 MEQ tablet Take 1 tablet (20 mEq total) by mouth daily. 11/09/18   [name], NP    Allergies    Patient has no known allergies.  Review of Systems   Review of Systems  Unable to perform ROS: Mental status change    Physical Exam Updated Vital Signs BP (!) 158/93   Pulse 88   Temp (!) 97.3 F (36.3 C) (Rectal)   Resp 17   Ht 6' 0.5" (1.842 m)   Wt 97.5 kg   SpO2 93%   BMI 28.76 kg/m   Physical Exam Constitutional:      General: He is awake. He is not in acute distress.    Appearance: Normal appearance. He is well-developed. He is ill-appearing. He is not toxic-appearing or diaphoretic.  HENT:     Head: Normocephalic and atraumatic.     Comments: Mild swelling to right side of the head without tenderness.    Mouth/Throat:     Mouth: Mucous membranes are dry.     Pharynx: Oropharynx is clear.  Eyes:     General: Vision grossly intact. Gaze aligned appropriately.     Extraocular Movements: Extraocular movements intact.     Pupils: Pupils are equal, round, and reactive to light.  Neck:     Trachea: Trachea and phonation normal. No tracheal tenderness or tracheal deviation.  Cardiovascular:     Rate and Rhythm: Normal rate.     Pulses:           Dorsalis pedis pulses are 1+ on the right side and 1+ on the left side.  Pulmonary:     Effort: Pulmonary effort is normal. No accessory muscle usage or respiratory distress.     Breath sounds: Normal air entry.     Comments: Diminished coarse Chest:     Chest wall: No deformity, tenderness or crepitus.  Abdominal:     General: Bowel sounds are normal. There is no distension.     Palpations: Abdomen is soft.     Tenderness: There is no  abdominal tenderness. There is no guarding or rebound.  Musculoskeletal:        General: Normal range of motion.     Cervical back: Normal range of motion and neck supple. No spinous process tenderness or muscular tenderness.     Comments: No midline C/T/L spinal tenderness to palpation, no paraspinal muscle tenderness, no deformity, crepitus, or step-off noted.  Pelvis stable to compression without pain.  All major joints palpated without pain or deformity.  Feet:     Right foot:     Protective Sensation: 3 sites tested. 3 sites sensed.     Toenail Condition: Right toenails are abnormally thick.     Left foot:     Protective Sensation: 3 sites tested. 3 sites sensed.     Toenail Condition: Left toenails are abnormally thick.  Skin:    General: Skin is warm and dry.     Comments: Law multiple areas of maceration to the right side without repairable defects or deep ulcerations.  Multiple ulcerations at pressure points including knees, shoulder, feet, elbows.  Neurological:     Mental Status: He is alert.     GCS: GCS eye subscore is 4. GCS verbal subscore is 5. GCS motor subscore is 6.     Comments: Speech is clear and goal oriented, follows commands Major Cranial nerves without deficit, no facial droop Moves extremities without ataxia, coordination intact  Psychiatric:        Behavior: Behavior normal. Behavior is cooperative.     ED Results / Procedures / Treatments   Labs (all labs ordered are listed, but only abnormal results are  displayed) Labs Reviewed  LACTIC ACID, PLASMA - Abnormal; Notable for the following components:      Result Value   Lactic Acid, Venous 5.2 (*)    All other components within normal limits  CBC WITH DIFFERENTIAL/PLATELET - Abnormal; Notable for the following components:   WBC 18.8 (*)    RBC 6.73 (*)    Hemoglobin 19.9 (*)    HCT 62.1 (*)    Neutro Abs 15.6 (*)    Monocytes Absolute 1.6 (*)    Abs Immature Granulocytes 0.56 (*)    All other components within normal limits  PROTIME-INR - Abnormal; Notable for the following components:   Prothrombin Time 15.9 (*)    INR 1.3 (*)    All other components within normal limits  APTT - Abnormal; Notable for the following components:   aPTT <20 (*)    All other components within normal limits  URINALYSIS, ROUTINE W REFLEX MICROSCOPIC - Abnormal; Notable for the following components:   Color, Urine AMBER (*)    APPearance HAZY (*)    Hgb urine dipstick SMALL (*)    Protein, ur 100 (*)    All other components within normal limits  CK - Abnormal; Notable for the following components:   Total CK 1,836 (*)    All other components within normal limits  COMPREHENSIVE METABOLIC PANEL - Abnormal; Notable for the following components:   Glucose, Bld 160 (*)    BUN 53 (*)    Creatinine, Ser 1.41 (*)    Albumin 3.1 (*)    AST 61 (*)    Total Bilirubin 1.3 (*)    GFR, Estimated 51 (*)    All other components within normal limits  TROPONIN I (HIGH SENSITIVITY) - Abnormal; Notable for the following components:   Troponin I (High Sensitivity) 43 (*)    All other components within normal  limits  TROPONIN I (HIGH SENSITIVITY) - Abnormal; Notable for the following components:   Troponin I (High Sensitivity) 45 (*)    All other components within normal limits  RESP PANEL BY RT PCR (RSV, FLU A&B, COVID)  URINE CULTURE  CULTURE, BLOOD (ROUTINE X 2)  CULTURE, BLOOD (ROUTINE X 2)  LIPASE, BLOOD  ETHANOL  LACTIC ACID, PLASMA  MAGNESIUM  CBG  MONITORING, ED    EKG EKG Interpretation  Date/Time:  Sunday September 13 2020 11:16:03 EST Ventricular Rate:  115 PR Interval:    QRS Duration: 111 QT Interval:  381 QTC Calculation: 527 R Axis:   10 Text Interpretation: Sinus tachycardia with irregular rate Probable left ventricular hypertrophy Abnormal inferior Q waves Nonspecific T abnormalities, lateral leads Prolonged QT interval Confirmed by Lacretia Leigh ([id number]) on 09/13/2020 1:18:05 PM   Radiology CT Head Wo Contrast  Result Date: 09/13/2020 CLINICAL DATA:  Mental status change. EXAM: CT HEAD WITHOUT CONTRAST TECHNIQUE: Contiguous axial images were obtained from the base of the skull through the vertex without intravenous contrast. COMPARISON:  None. FINDINGS: Brain: No evidence of acute infarction, hemorrhage, hydrocephalus, extra-axial collection or mass lesion/mass effect. Moderate brain parenchymal volume loss and deep white matter microangiopathy. Vascular: Heavy calcific atherosclerotic disease of the intracranial arteries. Skull: Normal. Negative for fracture or focal lesion. Sinuses/Orbits: Near complete opacification of the right maxillary sinus. Partial opacification of the ethmoid sinuses and left frontal sinus. Other: None. IMPRESSION: 1. No acute intracranial abnormality. 2. Moderate brain parenchymal atrophy and chronic microvascular disease. 3. Chronic sinusitis. Electronically Signed   By: Fidela Salisbury M.D.   On: 09/13/2020 12:30   CT Cervical Spine Wo Contrast  Result Date: 09/13/2020 CLINICAL DATA:  Unwitnessed fall.  Mental status change. EXAM: CT CERVICAL SPINE WITHOUT CONTRAST TECHNIQUE: Multidetector CT imaging of the cervical spine was performed without intravenous contrast. Multiplanar CT image reconstructions were also generated. COMPARISON:  None. FINDINGS: Alignment: Straightening of the cervical lordosis. Skull base and vertebrae: No acute fracture. No primary bone lesion or focal pathologic  process. 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 Soft tissues and spinal canal: No prevertebral fluid or swelling. No visible canal hematoma. Disc levels:  Multilevel osteoarthritic changes. Upper chest: Negative. Other: None. IMPRESSION: 1. No evidence of acute traumatic injury to the cervical spine. 2. 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 3. Multilevel osteoarthritic changes of the cervical spine. Electronically Signed   By: Fidela Salisbury M.D.   On: 09/13/2020 12:35   DG Pelvis Portable  Result Date: 09/13/2020 CLINICAL DATA:  Fall EXAM: PORTABLE PELVIS 1-2 VIEWS COMPARISON:  September 22, 2009 FINDINGS: There is no evidence of pelvic fracture or dislocation slight symmetric narrowing of each hip joint. No erosive change. IMPRESSION: Slight symmetric narrowing of each hip joint. No fracture or dislocation. Electronically Signed   By: Lowella Grip III M.D.   On: 09/13/2020 13:49   DG Chest Port 1 View  Result Date: 09/13/2020 CLINICAL DATA:  Hypertension with concern for sepsis EXAM: PORTABLE CHEST 1 VIEW COMPARISON:  September 11, 2009. FINDINGS: There is stable elevation of the left hemidiaphragm with left base atelectasis. The lungs elsewhere are clear. Heart size and pulmonary vascularity are normal. No adenopathy. There is degenerative change in the thoracic spine. IMPRESSION: Stable elevation of the left hemidiaphragm with left base atelectasis. Lungs elsewhere clear. Stable cardiac silhouette. Electronically Signed   By: Lowella Grip III M.D.   On: 09/13/2020 12:17    Procedures .Critical Care  Performed by: Deliah Boston, PA-C Authorized by: Deliah Boston, PA-C   Critical care provider statement:    Critical care time (minutes):  45   Critical care was necessary to treat or prevent imminent or life-threatening deterioration of the following conditions:  Sepsis, metabolic crisis and dehydration   Critical care was  time spent personally by me on the following activities:  Discussions with consultants, evaluation of patient's response to treatment, examination of patient, ordering and performing treatments and interventions, ordering and review of laboratory studies, ordering and review of radiographic studies, pulse oximetry, re-evaluation of patient's condition, obtaining history from patient or surrogate, review of old charts and development of treatment plan with patient or surrogate   (including critical care time)  Medications Ordered in ED Medications  lactated ringers infusion (has no administration in time range)  vancomycin (VANCOCIN) IVPB 1000 mg/200 mL premix (1,000 mg Intravenous New Bag/Given 09/13/20 1403)  lactated ringers bolus 1,000 mL (1,000 mLs Intravenous New Bag/Given 09/13/20 1141)  ceFEPIme (MAXIPIME) 2 g in sodium chloride 0.9 % 100 mL IVPB (0 g Intravenous Stopped 09/13/20 1214)  metroNIDAZOLE (FLAGYL) IVPB 500 mg (500 mg Intravenous New Bag/Given 09/13/20 1244)  lactated ringers bolus 1,000 mL (1,000 mLs Intravenous New Bag/Given 09/13/20 1310)  lactated ringers bolus 1,000 mL (1,000 mLs Intravenous New Bag/Given 09/13/20 1312)    ED Course  I have reviewed the triage vital signs and the nursing notes.  Pertinent labs & imaging results that were available during my care of the patient were reviewed by me and considered in my medical decision making (see chart for details).  Clinical Course as of Sep 13 1410  Sun Sep 13, 2020  1332 Dr. [name]   [BM]    Clinical Course User Index [BM] Gari Crown   MDM Rules/Calculators/A&P                         Additional history obtained from: 1. Nursing notes from this visit. 2. Family, ex-wife at bedside. 3. Review of electronic medical records.  No pertinent recent ER visits. ------- On initial evaluation patient is ill-appearing, confused to events appears he was last seen normal around 3 days ago was found down in  his bedroom laying on his right side.  He has multiple skin ulcerations to pressure points as well as some skin breakdown to the right upper arm and some swelling of the right side of the head.  He denies any pain he answers questions appropriately is just confused to why he was on the ground.  He is mildly tachycardic on evaluation and nursing staff is obtaining vital signs.  Will obtain screening labs for concern of sepsis will obtain CT head and cervical spine as well as chest x-ray.  CK and troponin added.  We will continue to monitor.  There is no leg length shortening or pain with palpation of the major joints or long bones to suggest extremity fracture/dislocation.  No pain with palpation of the chest abdomen pelvis back head or neck to suggest acute traumatic injury. - Patient noted to be hypothermic on rectal temperature, along with tachycardia code sepsis initiated with broad-spectrum antibiotics for unknown source, lactated ringer fluid boluses ordered. - I ordered, reviewed and interpreted labs which include: CBC shows leukocytosis of 18.8 and hemoglobin of 19.9 suspect hemoconcentration from dehydration as well as infection. Lactic elevated at 5.2. Urinalysis without clear evidence of infection. CBC shows evidence for acute kidney  injury with elevation of creatinine at 1.41 and BUN 53, no emergent LFT elevations or emergent electrolyte derangement, normal gap and bicarb patient does not appear to be in DKA. Ethanol negative, patient does not appear to be in withdrawal. Lipase within normal limits no evidence for pancreatitis. CK 1836, patient with mild rhabdomyolysis Troponin slightly elevated at 45, this may be due to patient's illness, low suspicion for ACS as patient without chest pain or shortness of breath, will obtain delta troponin. Covid/influenza panel negative.  CT head:    IMPRESSION:  1. No acute intracranial abnormality.  2. Moderate brain parenchymal atrophy and chronic  microvascular  disease.  3. Chronic sinusitis.   CT Cspine:  IMPRESSION:  1. No evidence of acute traumatic injury to the cervical spine.  2. 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  3. Multilevel osteoarthritic changes of the cervical spine.   CXR:  IMPRESSION:  Stable elevation of the left hemidiaphragm with left base  atelectasis. Lungs elsewhere clear. Stable cardiac silhouette.   DG Pelvis:  IMPRESSION:  Slight symmetric narrowing of each hip joint. No fracture or  dislocation.   EKG: Sinus tachycardia with irregular rate Probable left ventricular hypertrophy Abnormal inferior Q waves Nonspecific T abnormalities, lateral leads Prolonged QT interval Confirmed by Lacretia Leigh ([id number]) on 09/13/2020 1:18:05 PM ---------------------- Patient reassessed multiple times, remains stable with no acute distress.  His ex-wife is at bedside.  Patient denies any complaints reports that he is feeling better, has been on the bear hugger.  He denies any pain or injury.  He is receiving broad-spectrum antibiotics and 30 cc/kg lactated Ringer's.  Patient seen and evaluated by Dr. Zenia Resides, plan of care is admission to hospitalist service. ---------- 1:32 PM: Consultation with hospitalist Dr. [name], patient accepted for admission.  Note: Portions of this report may have been transcribed using voice recognition software. Every effort was made to ensure accuracy; however, inadvertent computerized transcription errors may still be present. Final Clinical Impression(s) / ED Diagnoses Final diagnoses:  Sepsis with acute renal failure without septic shock, due to unspecified organism, unspecified acute renal failure type [facility])  Non-traumatic rhabdomyolysis    Rx / DC Orders ED Discharge Orders    None       Gari Crown 09/13/20 1412    Lacretia Leigh, MD 09/18/20 1350

## 2020-09-13 NOTE — ED Triage Notes (Addendum)
Pt BIB EMS from home after pts ex-wife went to visit this morning but pt did not open door. Ex-wife found pt lying on the floor face down moaning. She states that the last time she saw him was 3 days ago. Pt confused and unable to provide events leading up to fall. Pt ex-wife states that this is not his normal baseline that he ambulates independently and is normally A&O. [employer] was sent last week for a well check but pt denied needing help at the time. EMS reports discoloration to R abdomen where he was lying. Pt denies complaints at this time. EMS reports pt smells strongly of urine.

## 2020-09-13 NOTE — ED Provider Notes (Signed)
Medical screening examination/treatment/procedure(s) were conducted as a shared visit with non-physician practitioner(s) and myself.  I personally evaluated the patient during the encounter.  EKG Interpretation  Date/Time:  Sunday September 13 2020 11:16:03 EST Ventricular Rate:  115 PR Interval:    QRS Duration: 111 QT Interval:  381 QTC Calculation: 527 R Axis:   10 Text Interpretation: Sinus tachycardia with irregular rate Probable left ventricular hypertrophy Abnormal inferior Q waves Nonspecific T abnormalities, lateral leads Prolonged QT interval Confirmed by Lacretia Leigh ([id number]) on 09/13/2020 1:64:34 PM   79 year old male who presents after being found down.  Patient is in mild rhabdo along with elevated lactate and concern for sepsis.  IV fluids antibiotics started.  Will be admitted to the hospital   Lacretia Leigh, MD 09/13/20 1318

## 2020-09-13 NOTE — Progress Notes (Signed)
A consult was received from an ED provider for vancomycin + cefepime per pharmacy dosing.  The patient's profile has been reviewed for ht/wt/allergies/indication/available labs.   -Ht/wt order entered STAT. Still no wt on file  A one time order has been placed for cefepime 2 g IV once + vancomycin 1000 mg IV once.    Further antibiotics/pharmacy consults should be ordered by admitting physician if indicated.                       Thank you, Lenis Noon, PharmD 09/13/2020  11:34 AM

## 2020-09-13 NOTE — ED Notes (Signed)
Rectal temp 97.3. Pt cont on bearhugger

## 2020-09-13 NOTE — Progress Notes (Signed)
Pharmacy Antibiotic Note  Tommy Bauer is a 79 y.o. male admitted on 09/13/2020 with sepsis.  Pharmacy has been consulted for vancomycin and cefepime dosing.  Plan:  Vancomycin 1000 mg given in ED, ordered additional 1000 mg for a loading dose of 2000 mg.  Then vancomycin 750 mg IV q12h   Cefepime 2 gr IV q12h   Monitor clinical course, renal function, cultures as available   Height: 6' 0.5" (184.2 cm) Weight: 97.5 kg (215 lb) IBW/kg (Calculated) : 78.75  Temp (24hrs), Avg:97.7 F (36.5 C), Min:97.3 F (36.3 C), Max:98.5 F (36.9 C)  Recent Labs  Lab 09/13/20 1052 09/13/20 1201 09/13/20 1400  WBC 18.8*  --   --   CREATININE  --  1.41*  --   LATICACIDVEN 5.2*  --  5.8*    Estimated Creatinine Clearance: 51.9 mL/min (A) (by C-G formula based on SCr of 1.41 mg/dL (H)).    No Known Allergies  Antimicrobials this admission: 11/14 vancomycin >>  11/14 cefepime >>   Dose adjustments this admission:   Microbiology results: 11/14 BCx:  11/14 UCx:   11/14 resp panel:   11/14 MRSA PCR:   Thank you for allowing pharmacy to be a part of this patient's care.   Royetta Asal, PharmD, BCPS 09/13/2020 5:49 PM

## 2020-09-13 NOTE — Progress Notes (Signed)
[facility] is following this sepsis. Thank you.

## 2020-09-13 NOTE — ED Notes (Signed)
Pt placed on male primofit at 60 mmHg.

## 2020-09-13 NOTE — ED Notes (Signed)
Pt washed up, changed into gown, bear hugger initiated, placed on cont cardiac monitoring. Mepilex dressing placed on open areas on pt interior bilateral knees, right outer thigh, right elbow, sacrum, and left collar bone.

## 2020-09-13 NOTE — ED Notes (Signed)
Per Lab recollect needed for grey top on ice and light green top tubes. RN advised. [employer]

## 2020-09-13 NOTE — ED Notes (Addendum)
2nd IV placed via [place], abx infusing. Labs recollected.

## 2020-09-13 NOTE — H&P (Addendum)
History and Physical        Hospital Admission Note Date: 09/13/2020  Patient name: Tommy Bauer Medical record number: [id number] Date of birth: 27-Jun-1941 Age: 79 y.o. Gender: male  PCP: [name], NP  Patient coming from: Home Lives with: Alone At baseline, ambulates: Independently  Chief Complaint    Chief Complaint  Patient presents with  . Altered Mental Status      HPI:   This is a 79 y.o. male with a past medical history of prostate cancer in 2011, CVA, hypertension, alcohol use, hyperlipidemia, central retinal artery occlusion who presented to the ED via EMS today after his ex-wife found him on the floor.  Per the patient's wife who checks in on him occasionally, she knocked on his door 3 days ago but he did not answer and she called 911 for a wellness check.  He did eventually walk to the door and reported that he was fine and did not want to go to the hospital.  She went to check on him again today but he did not answer the door.  His front door was open but the screen door was locked and she noted the house to be very cold.  She called for assistance again and was able to get in the house and he was found laying in his bedroom on his right side prompting the patient to be brought to the ED.  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  The patient does not recall these events and is unable to recall what happened over the past couple days.  He denies any complaints at this time.  ED Course: Hypothermic with Bair Hugger, tachycardic, tachypneic, hypertensive.  Notable Labs: Glucose 160, BUN 53, creatinine 1.41, albumin 3.1, AST 61, ALT 39, T bili 1.3, CK 1836, troponin 43-> 45, WBC 18.8, Hb 19.9, platelets 176, lactic acid 5.2, UA overall unremarkable. Notable Imaging: CXR, CT head, CT cervical spine, pelvis x-ray overall unremarkable for acute findings.. Patient received IV fluids  per sepsis protocol, vancomycin, metronidazole and cefepime.    Vitals:   09/13/20 1730 09/13/20 1800  BP: (!) 144/80 (!) 152/89  Pulse: 97 95  Resp: 16 20  Temp:    SpO2: 93% 95%     Review of Systems:  Review of Systems  All other systems reviewed and are negative.   Medical/Social/Family History   Past Medical History: Past Medical History:  Diagnosis Date  . ADENOCARCINOMA, PROSTATE 05/19/2010  . BACK PAIN 07/16/2008  . Carpal tunnel syndrome 10/15/2008  . COLONIC POLYPS, HX OF 06/08/2007  . Contusion    left shoulder,left knee,s/p recent fall end of march 2013  . CVA (cerebral vascular accident) ([place])   . ERECTILE DYSFUNCTION, ORGANIC 07/11/2007  . Headache(784.0) 07/11/2007  . HYPERTENSION 06/08/2007  . INGUINAL HERNIA 12/04/2007  . LEFT VENTRICULAR FUNCTION, DECREASED 11/12/2007  . Prostate cancer ([place]) 09/14/2009   prostateectomy,adenocarcinoma,gleason=4+3=7  . PROSTATE SPECIFIC ANTIGEN, ELEVATED 05/06/2009  . TOBACCO ABUSE 11/06/2009    Past Surgical History:  Procedure Laterality Date  . HERNIA REPAIR     right ingunial  . PROSTATE SURGERY  09/14/2009   radical prostatectomy /gleason=4+3-7    Medications: Prior to Admission medications  Medication Sig Start Date End Date Taking? Authorizing Provider  acetaminophen (TYLENOL) 500 MG tablet Take 500 mg by mouth every 4 (four) hours as needed.    [provider]  aspirin 81 MG tablet Take 1 tablet (81 mg total) by mouth daily. 07/15/15   Marletta Lor, MD  atorvastatin (LIPITOR) 40 MG tablet Take 1 tablet (40 mg total) by mouth daily. 07/30/19   Nafziger, Tommi Rumps, NP  benazepril (LOTENSIN) 40 MG tablet Take 1 tablet (40 mg total) by mouth daily. 11/09/18   Nafziger, Tommi Rumps, NP  bimatoprost (LUMIGAN) 0.03 % ophthalmic solution Place 1 drop into both eyes at bedtime.    [provider]  chlorthalidone (HYGROTON) 25 MG tablet Take 1 tablet (25 mg total) by mouth daily. 11/09/18   Nafziger, Tommi Rumps, NP    metoprolol succinate (TOPROL-XL) 100 MG 24 hr tablet TAKE ONE TABLET BY MOUTH ONCE DAILY TAKE  WITH  OR  IMMEDIATELY  FOLLOWING  A  MEAL 11/09/18   Nafziger, Tommi Rumps, NP  potassium chloride SA (K-DUR,KLOR-CON) 20 MEQ tablet Take 1 tablet (20 mEq total) by mouth daily. 11/09/18   Nafziger, Tommi Rumps, NP    Allergies:  No Known Allergies  Social History:  reports that he has been smoking cigarettes. He has a 30.00 pack-year smoking history. He has never used smokeless tobacco. He reports previous alcohol use. He reports that he does not use drugs.  Family History: Family History  Problem Relation Age of Onset  . Colon cancer Neg Hx      Objective   Physical Exam: Blood pressure (!) 152/89, pulse 95, temperature 98.5 F (36.9 C), temperature source Rectal, resp. rate 20, height 6' 0.5" (1.842 m), weight 97.5 kg, SpO2 95 %.  Physical Exam Vitals and nursing note reviewed. Exam conducted with a chaperone present.  Constitutional:      General: He is not in acute distress.    Comments: Chronically ill-appearing with poor hygiene  HENT:     Head: Normocephalic.     Mouth/Throat:     Mouth: Mucous membranes are dry.     Comments: Tongue appears to have a bruise on the right side Eyes:     General:        Left eye: Discharge present.    Extraocular Movements: Extraocular movements intact.     Conjunctiva/sclera:     Left eye: Left conjunctiva is injected. Exudate present.     Pupils: Pupils are equal, round, and reactive to light.  Cardiovascular:     Rate and Rhythm: Regular rhythm. Tachycardia present.  Pulmonary:     Effort: Pulmonary effort is normal.     Breath sounds: Normal breath sounds.  Abdominal:     General: Abdomen is flat.     Palpations: Abdomen is soft.  Musculoskeletal:        General: No swelling.     Right lower leg: No edema.     Left lower leg: No edema.     Comments: Bilateral feet with nail overgrowth  Skin:    Coloration: Skin is not jaundiced or pale.   Neurological:     Mental Status: He is alert. He is disoriented.     Comments: Awake and alert, oriented x 1 to place  Psychiatric:        Mood and Affect: Mood normal.        Behavior: Behavior normal.     LABS on Admission: I have personally reviewed all the labs and imaging below  Basic Metabolic Panel: Recent Labs  Lab 09/13/20 1201 09/13/20 1205  NA 144  --   K 4.2  --   CL 101  --   CO2 29  --   GLUCOSE 160*  --   BUN 53*  --   CREATININE 1.41*  --   CALCIUM 9.1  --   MG  --  3.0*   Liver Function Tests: Recent Labs  Lab 09/13/20 1201  AST 61*  ALT 39  ALKPHOS 43  BILITOT 1.3*  PROT 7.7  ALBUMIN 3.1*   Recent Labs  Lab 09/13/20 1201  LIPASE 27   No results for input(s): AMMONIA in the last 168 hours. CBC: Recent Labs  Lab 09/13/20 1052  WBC 18.8*  NEUTROABS 15.6*  HGB 19.9*  HCT 62.1*  MCV 92.3  PLT 176   Cardiac Enzymes: Recent Labs  Lab 09/13/20 1201  CKTOTAL 1,836*   BNP: Invalid input(s): POCBNP CBG: Recent Labs  Lab 09/13/20 1113  GLUCAP 99    Radiological Exams on Admission:  CT Head Wo Contrast  Result Date: 09/13/2020 CLINICAL DATA:  Mental status change. EXAM: CT HEAD WITHOUT CONTRAST TECHNIQUE: Contiguous axial images were obtained from the base of the skull through the vertex without intravenous contrast. COMPARISON:  None. FINDINGS: Brain: No evidence of acute infarction, hemorrhage, hydrocephalus, extra-axial collection or mass lesion/mass effect. Moderate brain parenchymal volume loss and deep white matter microangiopathy. Vascular: Heavy calcific atherosclerotic disease of the intracranial arteries. Skull: Normal. Negative for fracture or focal lesion. Sinuses/Orbits: Near complete opacification of the right maxillary sinus. Partial opacification of the ethmoid sinuses and left frontal sinus. Other: None. IMPRESSION: 1. No acute intracranial abnormality. 2. Moderate brain parenchymal atrophy and chronic microvascular  disease. 3. Chronic sinusitis. Electronically Signed   By: Fidela Salisbury M.D.   On: 09/13/2020 12:30   CT Cervical Spine Wo Contrast  Result Date: 09/13/2020 CLINICAL DATA:  Unwitnessed fall.  Mental status change. EXAM: CT CERVICAL SPINE WITHOUT CONTRAST TECHNIQUE: Multidetector CT imaging of the cervical spine was performed without intravenous contrast. Multiplanar CT image reconstructions were also generated. COMPARISON:  None. FINDINGS: Alignment: Straightening of the cervical lordosis. Skull base and vertebrae: No acute fracture. No primary bone lesion or focal pathologic process. 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 Soft tissues and spinal canal: No prevertebral fluid or swelling. No visible canal hematoma. Disc levels:  Multilevel osteoarthritic changes. Upper chest: Negative. Other: None. IMPRESSION: 1. No evidence of acute traumatic injury to the cervical spine. 2. 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 3. Multilevel osteoarthritic changes of the cervical spine. Electronically Signed   By: Fidela Salisbury M.D.   On: 09/13/2020 12:35   DG Pelvis Portable  Result Date: 09/13/2020 CLINICAL DATA:  Fall EXAM: PORTABLE PELVIS 1-2 VIEWS COMPARISON:  September 22, 2009 FINDINGS: There is no evidence of pelvic fracture or dislocation slight symmetric narrowing of each hip joint. No erosive change. IMPRESSION: Slight symmetric narrowing of each hip joint. No fracture or dislocation. Electronically Signed   By: Lowella Grip III M.D.   On: 09/13/2020 13:49   DG Chest Port 1 View  Result Date: 09/13/2020 CLINICAL DATA:  Hypertension with concern for sepsis EXAM: PORTABLE CHEST 1 VIEW COMPARISON:  September 11, 2009. FINDINGS: There is stable elevation of the left hemidiaphragm with left base atelectasis. The lungs elsewhere are clear. Heart size and pulmonary vascularity are normal. No adenopathy. There is degenerative change in the thoracic  spine. IMPRESSION: Stable elevation  of the left hemidiaphragm with left base atelectasis. Lungs elsewhere clear. Stable cardiac silhouette. Electronically Signed   By: Lowella Grip III M.D.   On: 09/13/2020 12:17      EKG: Individually reviewed   A & P   Principal Problem:   Sepsis ([place]) Active Problems:   Essential hypertension   Central retinal artery occlusion   AMS (altered mental status)   AKI (acute kidney injury) ([place])   Conjunctivitis   Rhabdomyolysis   1. Sepsis without septic shock, unknown source a. Hypothermic, Tachycardic, tachypneic, leukocytosis, lactic acidosis (up to 5.8) b. SOFA Score  3: Cr 1.2-1.9 [1 pt], T Bili 1.2-1.9 [1 pt] and GCS 13-14 [1 pt]  c. Follow-up blood cultures d. Follow-up urine culture e. Continue Vancomycin and Cefepime pending MRSA nares f. Change LR to NS   2. Left eye conjunctivitis, suspect bacterial with history of central retinal artery occlusion a. Erythromycin ointment and eye care  3. Mild rhabdomyolysis a. IV fluids  4. Acute metabolic encephalopathy of unknown etiology a. CT head unremarkable but he has a history of prostate cancer, stroke and looks like he bit his tongue - seizure, brain mets, stroke, syncopal episode? b. Will check an MRI brain c. EEG d. TSH  5. AKI a. Holding ACE and getting fluids  6. Hypertension a. Holding home benazepril b. Continue home Toprol XL  7. Alcohol use a. Unknown when his last drink was b. Ethanol level negative c. Monitor off CIWA due to sepsis vitals and to monitor mental status for now  8. History of Stroke a. Continue home aspirin and statin    DVT prophylaxis: enoxaparin (LOVENOX) injection 40 mg Start: 09/13/20 1815     Code Status: Full Code  Diet: NPO Family Communication: Admission, patients condition and plan of care including tests being ordered have been discussed with the patient who indicates understanding and agrees with the plan and Code Status.  Patient's ex wife was updated  Disposition Plan: The appropriate patient status for this patient is INPATIENT. Inpatient status is judged to be reasonable and necessary in order to provide the required intensity of service to ensure the patient's safety. The patient's presenting symptoms, physical exam findings, and initial radiographic and laboratory data in the context of their chronic comorbidities is felt to place them at high risk for further clinical deterioration. Furthermore, it is not anticipated that the patient will be medically stable for discharge from the hospital within 2 midnights of admission. The following factors support the patient status of inpatient.   " The patient's presenting symptoms include AMS. " The worrisome physical exam findings include disoriented, conjunctivitis. " The initial radiographic and laboratory data are worrisome because of lactic acidosis, leukocytosis, sepsis, unremarkable imaging. " The chronic co-morbidities include CVA, hypertension.   * I certify that at the point of admission it is my clinical judgment that the patient will require inpatient hospital care spanning beyond 2 midnights from the point of admission due to high intensity of service, high risk for further deterioration and high frequency of surveillance required.*   Status is: Inpatient  Remains inpatient appropriate because:Altered mental status, Ongoing diagnostic testing needed not appropriate for outpatient work up, IV treatments appropriate due to intensity of illness or inability to take PO and Inpatient level of care appropriate due to severity of illness   Dispo: The patient is from: Home              Anticipated d/c is to: TBD  Anticipated d/c date is: > 3 days              Patient currently is not medically stable to d/c.         The medical decision making on this patient was of high complexity and the patient is at high risk for clinical deterioration,  therefore this is a level 3  admission.  Consultants  . none  Procedures  . none  Time Spent on Admission: 75 minutes    Harold Hedge, DO [facility]  09/13/2020, 6:28 PM

## 2020-09-14 ENCOUNTER — Inpatient Hospital Stay (HOSPITAL_COMMUNITY): Payer: Medicare Other

## 2020-09-14 ENCOUNTER — Inpatient Hospital Stay (HOSPITAL_COMMUNITY)
Admit: 2020-09-14 | Discharge: 2020-09-14 | Disposition: A | Discharge status: Home / Self Care | Payer: Medicare Other | Attending: Internal Medicine | Admitting: Internal Medicine

## 2020-09-14 DIAGNOSIS — I361 Nonrheumatic tricuspid (valve) insufficiency: Secondary | ICD-10-CM

## 2020-09-14 DIAGNOSIS — R7881 Bacteremia: Secondary | ICD-10-CM | POA: Diagnosis present

## 2020-09-14 DIAGNOSIS — M6282 Rhabdomyolysis: Secondary | ICD-10-CM

## 2020-09-14 LAB — BLOOD CULTURE ID PANEL (REFLEXED) - BCID2

## 2020-09-14 LAB — CBC
HCT: 52.6 % — ABNORMAL HIGH (ref 39.0–52.0)
Hemoglobin: 16.5 g/dL (ref 13.0–17.0)
MCH: 29.2 pg (ref 26.0–34.0)
MCHC: 31.4 g/dL (ref 30.0–36.0)
MCV: 92.9 fL (ref 80.0–100.0)
Platelets: 158 10*3/uL (ref 150–400)
RBC: 5.66 MIL/uL (ref 4.22–5.81)
RDW: 13.6 % (ref 11.5–15.5)
WBC: 15.4 10*3/uL — ABNORMAL HIGH (ref 4.0–10.5)
nRBC: 0 % (ref 0.0–0.2)

## 2020-09-14 LAB — BASIC METABOLIC PANEL
Anion gap: 12 (ref 5–15)
BUN: 49 mg/dL — ABNORMAL HIGH (ref 8–23)
CO2: 25 mmol/L (ref 22–32)
Calcium: 8.6 mg/dL — ABNORMAL LOW (ref 8.9–10.3)
Chloride: 107 mmol/L (ref 98–111)
Creatinine, Ser: 1.14 mg/dL (ref 0.61–1.24)
GFR, Estimated: >60 mL/min (ref >60)
Glucose, Bld: 119 mg/dL — ABNORMAL HIGH (ref 70–99)
Potassium: 3.9 mmol/L (ref 3.5–5.1)
Sodium: 144 mmol/L (ref 135–145)

## 2020-09-14 LAB — LACTIC ACID, PLASMA
Lactic Acid, Venous: 2.2 mmol/L (ref 0.5–1.9)
Lactic Acid, Venous: 2.7 mmol/L (ref 0.5–1.9)

## 2020-09-14 LAB — URINE CULTURE: Culture: <10000 — AB

## 2020-09-14 LAB — PROTIME-INR
INR: 1.3 — ABNORMAL HIGH (ref 0.8–1.2)
Prothrombin Time: 15.6 seconds — ABNORMAL HIGH (ref 11.4–15.2)

## 2020-09-14 LAB — MRSA PCR SCREENING: MRSA by PCR: NEGATIVE

## 2020-09-14 LAB — ECHOCARDIOGRAM COMPLETE
Height: 74 in
Weight: 2938.29 oz

## 2020-09-14 LAB — PROCALCITONIN: Procalcitonin: 0.31 ng/mL

## 2020-09-14 MED ORDER — SODIUM CHLORIDE 0.9 % IV SOLN
2.0000 g | Freq: Three times a day (TID) | INTRAVENOUS | Status: DC
  Administered 2020-09-14: 2 g via INTRAVENOUS
  Filled 2020-09-14 (×2): qty 2

## 2020-09-14 MED ORDER — CHLORHEXIDINE GLUCONATE 0.12 % MT SOLN
15.0000 mL | Freq: Two times a day (BID) | OROMUCOSAL | Status: DC
  Administered 2020-09-14 – 2020-09-17 (×7): 15 mL via OROMUCOSAL
  Filled 2020-09-14 (×7): qty 15

## 2020-09-14 MED ORDER — SODIUM CHLORIDE 0.9 % IV SOLN: INTRAVENOUS | Status: AC

## 2020-09-14 MED ORDER — ORAL CARE MOUTH RINSE
15.0000 mL | Freq: Two times a day (BID) | OROMUCOSAL | Status: DC
  Administered 2020-09-14 – 2020-09-17 (×6): 15 mL via OROMUCOSAL

## 2020-09-14 NOTE — Progress Notes (Signed)
Attempted echo.  Patient w/ EEG tech. Will attempt later

## 2020-09-14 NOTE — TOC Initial Note (Signed)
Transition of Care [facility]) - Initial/Assessment Note    Patient Details  Name: Tommy Bauer MRN: [id number] Date of Birth: 1941-09-22  [facility] CM/SW Contact:    Joaquin Courts, RN Phone Number: 09/14/2020, 2:37 PM  Clinical Narrative:  CM received message that patient's ex-wife would like to discuss dc planning.  Patient gave verbal consent for CM to speak with ex-spouse.  Ex-wife shares that patient lives alone and has not been able to take care of himself, is very disheveled, and has been falling at home.  Patient has not been following up with his pcp and spouse is concerned about patient's safety at home and his ability to care for himself.  Ex-spouse reports patient's home is in poor condition.  Cm discussed that PT/OT evaluations are pending and will help to guide the discharge planning process, CM will await evaluation results.  Ex-spouse states that patient will need some rehab before he can return home, reports patient was able to ambulate independently before fall and hospitalization but currently is very weak.  CM will continue to follow.                 Expected Discharge Plan: Skilled Nursing Facility Barriers to Discharge: Continued Medical Work up   Patient Goals and CMS Choice Patient states their goals for this hospitalization and ongoing recovery are:: to get better      Expected Discharge Plan and Services Expected Discharge Plan: [place]   Discharge Planning Services: CM Consult   Living arrangements for the past 2 months: Single Family Home                                      Prior Living Arrangements/Services Living arrangements for the past 2 months: Single Family Home Lives with:: Self Patient language and need for interpreter reviewed:: Yes Do you feel safe going back to the place where you live?: No   cannot care for self at home  Need for Family Participation in Patient Care: Yes (Comment) Care giver  support system in place?: Yes (comment)   Criminal Activity/Legal Involvement Pertinent to Current Situation/Hospitalization: No - Comment as needed  Activities of Daily Living Home Assistive Devices/Equipment: None ADL Screening (condition at time of admission) Patient's cognitive ability adequate to safely complete daily activities?: No Is the patient deaf or have difficulty hearing?: No Does the patient have difficulty seeing, even when wearing glasses/contacts?: No Does the patient have difficulty concentrating, remembering, or making decisions?: Yes Patient able to express need for assistance with ADLs?: Yes Does the patient have difficulty dressing or bathing?: Yes Independently performs ADLs?: No Does the patient have difficulty walking or climbing stairs?: Yes Weakness of Legs: Both Weakness of Arms/Hands: Both  Permission Sought/Granted                  Emotional Assessment Appearance:: Appears stated age Attitude/Demeanor/Rapport: Engaged Affect (typically observed): Accepting Orientation: : Oriented to Self, Oriented to Place, Oriented to  Time, Oriented to Situation   Psych Involvement: No (comment)  Admission diagnosis:  Sepsis ([place]) [A41.9] Non-traumatic rhabdomyolysis [M62.82] Sepsis with acute renal failure without septic shock, due to unspecified organism, unspecified acute renal failure type ([place]) [A41.9, R65.20, N17.9] Patient Active Problem List   Diagnosis Date Noted  . Bacteremia due to Gram-positive bacteria 09/14/2020  . Sepsis ([place]) 09/13/2020  . AMS (altered mental status) 09/13/2020  .  AKI (acute kidney injury) ([place]) 09/13/2020  . Conjunctivitis 09/13/2020  . Rhabdomyolysis 09/13/2020  . Central retinal artery occlusion 07/15/2015  . 79 year-old gentleman s/p radical prostatectomy for pT3a adenocarcinoma with a Gleason's Score of 4+3, with extracapsular extension noted on pathology, and rising PSA of 0.36 05/19/2010  . TOBACCO ABUSE 11/06/2009   . CARPAL TUNNEL SYNDROME 10/15/2008  . BACK PAIN 07/16/2008  . INGUINAL HERNIA 12/04/2007  . Cardiovascular disease 11/12/2007  . ERECTILE DYSFUNCTION, ORGANIC 07/11/2007  . HEADACHE 07/11/2007  . Essential hypertension 06/08/2007  . History of colonic polyps 06/08/2007   PCP:  [name], NP Pharmacy:   [facility] [place], [place] - [address] AT [address] [place] [address] Phone: [phone] Fax: [phone]     Social Determinants of Health (SDOH) Interventions    Readmission Risk Interventions No flowsheet data found.

## 2020-09-14 NOTE — Evaluation (Signed)
Physical Therapy Evaluation Patient Details Name: Tommy Bauer MRN: [id number] DOB: 1941/05/31 Today's Date: 09/14/2020   History of Present Illness  Pt is 79 yo male with PMH of prostate CA, CVA, HTN, ETOH abuse, hyperlipidemia, and central retinal artery occlusion.  Pt was found on the floor at home by ex-wife.  Pt admitted with sepsis, L eye conjunctivitis, and mild rhabdomyolysis, and metabolic encephalopathy.  Clinical Impression  Pt admitted with above diagnosis. He presented with decreased mobility, strength, endurance, safety, and balance.  Pt was confused and not able to provide PLOF ; however, per chart pt lives alone with limited support and frequent falls. Pt required at least min A today and assist to initiate transfers. Pt currently with functional limitations due to the deficits listed below (see PT Problem List). Pt will benefit from skilled PT to increase their independence and safety with mobility to allow discharge to the venue listed below.       Follow Up Recommendations SNF    Equipment Recommendations  Rolling [place] with 5" wheels;3in1 (PT)    Recommendations for Other Services       Precautions / Restrictions Precautions Precautions: Fall      Mobility  Bed Mobility Overal bed mobility: Needs Assistance Bed Mobility: Supine to Sit;Sit to Supine     Supine to sit: Mod assist Sit to supine: Mod assist   General bed mobility comments: increased time and cues/assist for initiation    Transfers Overall transfer level: Needs assistance Equipment used: 2 person hand held assist Transfers: Sit to/from Stand Sit to Stand: Min assist;+2 safety/equipment         General transfer comment: assist to initiate and steady  Ambulation/Gait Ambulation/Gait assistance: Min assist;+2 safety/equipment Gait Distance (Feet): 3 Feet Assistive device: 2 person hand held assist Gait Pattern/deviations: Step-to pattern;Decreased stride length;Shuffle Gait velocity:  decreased   General Gait Details: Few steps at EOB, unsteady, and decreased safety so returned to bed  Stairs            Wheelchair Mobility    Modified Rankin (Stroke Patients Only)       Balance Overall balance assessment: Needs assistance Sitting-balance support: No upper extremity supported Sitting balance-Leahy Scale: Good     Standing balance support: Bilateral upper extremity supported;Single extremity supported Standing balance-Leahy Scale: Poor Standing balance comment: Requiring UE support with tendency to step backward                             Pertinent Vitals/Pain Pain Assessment: No/denies pain    Home Living Family/patient expects to be discharged to:: Skilled nursing facility                 Additional Comments: Pt unable to provide hx.  Per chart , case manager spoke with ex wife who reports pt lives alone, unable to care for self, frequenct falls, and home is disheveled.    Prior Function           Comments: Pt very confused and unable to provide PLOF.  Per chart pt struggling to care for self at home     Hand Dominance        Extremity/Trunk Assessment   Upper Extremity Assessment Upper Extremity Assessment: Overall WFL for tasks assessed    Lower Extremity Assessment Lower Extremity Assessment: LLE deficits/detail;RLE deficits/detail RLE Deficits / Details: ROM WFL; MMT 4/5 LLE Deficits / Details: ROM WFL; MMT 4/5    Cervical /  Trunk Assessment Cervical / Trunk Assessment: Normal  Communication      Cognition Arousal/Alertness: Awake/alert Behavior During Therapy: WFL for tasks assessed/performed Overall Cognitive Status: No family/caregiver present to determine baseline cognitive functioning Area of Impairment: Orientation;Problem solving;Attention;Memory;Following commands;Safety/judgement;Awareness                 Orientation Level: Disoriented to;Place;Time;Situation (thinks it is 1980's and he  is at home) Current Attention Level: Focused Memory: Decreased short-term memory Following Commands: Follows one step commands inconsistently Safety/Judgement: Decreased awareness of safety;Decreased awareness of deficits Awareness: Intellectual Problem Solving: Slow processing;Difficulty sequencing;Requires verbal cues;Requires tactile cues        General Comments General comments (skin integrity, edema, etc.): vss    Exercises     Assessment/Plan    PT Assessment Patient needs continued PT services  PT Problem List Decreased strength;Decreased mobility;Decreased safety awareness;Decreased range of motion;Decreased coordination;Decreased activity tolerance;Decreased cognition;Decreased balance;Decreased knowledge of use of DME       PT Treatment Interventions DME instruction;Therapeutic activities;Gait training;Therapeutic exercise;Patient/family education;Stair training;Balance training;Functional mobility training;Neuromuscular re-education;Cognitive remediation    PT Goals (Current goals can be found in the Care Plan section)  Acute Rehab PT Goals Patient Stated Goal: unable PT Goal Formulation: Patient unable to participate in goal setting Time For Goal Achievement: 09/28/20 Potential to Achieve Goals: Good    Frequency Min 2X/week   Barriers to discharge Decreased caregiver support      Co-evaluation               AM-PAC PT "6 Clicks" Mobility  Outcome Measure Help needed turning from your back to your side while in a flat bed without using bedrails?: A Lot Help needed moving from lying on your back to sitting on the side of a flat bed without using bedrails?: A Lot Help needed moving to and from a bed to a chair (including a wheelchair)?: A Lot Help needed standing up from a chair using your arms (e.g., wheelchair or bedside chair)?: A Lot Help needed to walk in hospital room?: A Lot Help needed climbing 3-5 steps with a railing? : A Lot 6 Click Score:  12    End of Session Equipment Utilized During Treatment: Gait belt Activity Tolerance: Patient tolerated treatment well Patient left: in bed;with call bell/phone within reach;with bed alarm set Nurse Communication: Mobility status PT Visit Diagnosis: Unsteadiness on feet (R26.81);Muscle weakness (generalized) (M62.81);History of falling (Z91.81)    Time: 2800-3491 PT Time Calculation (min) (ACUTE ONLY): 26 min   Charges:   PT Evaluation $PT Eval Low Complexity: 1 Low PT Treatments $Therapeutic Activity: 8-22 mins        Abran Richard, PT Acute Rehab Services Pager [phone] [facility] [place] 09/14/2020, 5:26 PM

## 2020-09-14 NOTE — Progress Notes (Signed)
PHARMACY - PHYSICIAN COMMUNICATION CRITICAL VALUE ALERT - BLOOD CULTURE IDENTIFICATION (BCID)  Tommy Bauer is an 79 y.o. male who presented to [facility] on 09/13/2020 with a chief complaint of AMS, r/o sepsis  Assessment:  11/14 BCx resulted with BCID 11/15 with Staph species in 1 of 4 bottles  Name of physician (or Provider) Contacted: Dr Grandville Silos aware  Current antibiotics: Vancomycin and Cefepime  Changes to prescribed antibiotics recommended: no changes, MD treating as bacteremia  Results for orders placed or performed during the hospital encounter of 09/13/20  Blood Culture ID Panel (Reflexed) (Collected: 09/13/2020 10:52 AM)  Result Value Ref Range   Enterococcus faecalis NOT DETECTED NOT DETECTED   Enterococcus Faecium NOT DETECTED NOT DETECTED   Listeria monocytogenes NOT DETECTED NOT DETECTED   Staphylococcus species DETECTED (A) NOT DETECTED   Staphylococcus aureus (BCID) NOT DETECTED NOT DETECTED   Staphylococcus epidermidis NOT DETECTED NOT DETECTED   Staphylococcus lugdunensis NOT DETECTED NOT DETECTED   Streptococcus species NOT DETECTED NOT DETECTED   Streptococcus agalactiae NOT DETECTED NOT DETECTED   Streptococcus pneumoniae NOT DETECTED NOT DETECTED   Streptococcus pyogenes NOT DETECTED NOT DETECTED   A.calcoaceticus-baumannii NOT DETECTED NOT DETECTED   Bacteroides fragilis NOT DETECTED NOT DETECTED   Enterobacterales NOT DETECTED NOT DETECTED   Enterobacter cloacae complex NOT DETECTED NOT DETECTED   Escherichia coli NOT DETECTED NOT DETECTED   Klebsiella aerogenes NOT DETECTED NOT DETECTED   Klebsiella oxytoca NOT DETECTED NOT DETECTED   Klebsiella pneumoniae NOT DETECTED NOT DETECTED   Proteus species NOT DETECTED NOT DETECTED   Salmonella species NOT DETECTED NOT DETECTED   Serratia marcescens NOT DETECTED NOT DETECTED   Haemophilus influenzae NOT DETECTED NOT DETECTED   Neisseria meningitidis NOT DETECTED NOT DETECTED   Pseudomonas aeruginosa NOT  DETECTED NOT DETECTED   Stenotrophomonas maltophilia NOT DETECTED NOT DETECTED   Candida albicans NOT DETECTED NOT DETECTED   Candida auris NOT DETECTED NOT DETECTED   Candida glabrata NOT DETECTED NOT DETECTED   Candida krusei NOT DETECTED NOT DETECTED   Candida parapsilosis NOT DETECTED NOT DETECTED   Candida tropicalis NOT DETECTED NOT DETECTED   Cryptococcus neoformans/gattii NOT DETECTED NOT DETECTED    Minda Ditto PharmD 09/14/2020  12:14 PM

## 2020-09-14 NOTE — Evaluation (Signed)
Clinical/Bedside Swallow Evaluation Patient Details  Name: Tommy Bauer MRN: [id number] Date of Birth: 1941-03-14  Today's Date: 09/14/2020 Time: SLP Start Time (ACUTE ONLY): 1660 SLP Stop Time (ACUTE ONLY): 1313 SLP Time Calculation (min) (ACUTE ONLY): 32 min  Past Medical History:  Past Medical History:  Diagnosis Date  . ADENOCARCINOMA, PROSTATE 05/19/2010  . BACK PAIN 07/16/2008  . Carpal tunnel syndrome 10/15/2008  . COLONIC POLYPS, HX OF 06/08/2007  . Contusion    left shoulder,left knee,s/p recent fall end of march 2013  . CVA (cerebral vascular accident) ([place])   . ERECTILE DYSFUNCTION, ORGANIC 07/11/2007  . Headache(784.0) 07/11/2007  . HYPERTENSION 06/08/2007  . INGUINAL HERNIA 12/04/2007  . LEFT VENTRICULAR FUNCTION, DECREASED 11/12/2007  . Prostate cancer ([place]) 09/14/2009   prostateectomy,adenocarcinoma,gleason=4+3=7  . PROSTATE SPECIFIC ANTIGEN, ELEVATED 05/06/2009  . TOBACCO ABUSE 11/06/2009   Past Surgical History:  Past Surgical History:  Procedure Laterality Date  . HERNIA REPAIR     right ingunial  . PROSTATE SURGERY  09/14/2009   radical prostatectomy /gleason=4+3-7   HPI:  79 yo male adm to [facility] after being found down by his exwife. Pt has h/o CVA, ETOH use, central retina artery occlusion, HLD, HTN , prostate cancer 2011.  Swallow evaluation ordered.  Pt denies dysphagia.  MRI and CT imaging work up negative for stroke.  CXR showed There is stable elevation of the left hemidiaphragm with left base atelectasis.  Pt is undergoing EEG and ECHO as well.   Assessment / Plan / Recommendation Clinical Impression  Pt alert and upright in bed with exwife prseent.  Pt denies dysphagia prior to admission.  He has eyebrow asymmetry but otherwise no focal CN deficits.  He did require encouragement to brush his teeth and expectorate secretions.  With initial boluses of thin water, cough x2 noted, however after consumption of other boluses - sandwich, applesauce - no other coughing with  water intake via cup.  Pt observed to take large boluses of sandwich however EEG staff in room and he may have felt rushed but he also has been NPO since admit. Encouraged him to take small bites and sips and eat slowly - to which he agreed.  Also advised pt avoid straws currently due to overt cough and increased risk of aspiration. Belching noted but pt denies significant nor refluxing.  Regular/thin, medicinie with puree and no straws advised.  Pt and exwife informed and agreeable to plan.  Will follow up x1 given pt coughing intermittently to assure po tolerance and education completed.   SLP Visit Diagnosis: Dysphagia, unspecified (R13.10)    Aspiration Risk  Mild aspiration risk    Diet Recommendation Regular;Thin liquid   Liquid Administration via: Cup;No straw Medication Administration: Whole meds with puree Supervision: Patient able to self feed;Intermittent supervision to cue for compensatory strategies Compensations: Slow rate;Small sips/bites Postural Changes: Seated upright at 90 degrees;Remain upright for at least 30 minutes after po intake    Other  Recommendations Oral Care Recommendations: Oral care BID   Follow up Recommendations None      Frequency and Duration min 1 x/week  1 week       Prognosis Prognosis for Safe Diet Advancement: Fair Barriers to Reach Goals: Cognitive deficits      Swallow Study   General Date of Onset: 09/14/20 HPI: 79 yo male adm to [facility] after being found down by his exwife. Pt has h/o CVA, ETOH use, central retina artery occlusion, HLD, HTN , prostate cancer 2011.  Swallow evaluation  ordered.  Pt denies dysphagia.  MRI and CT imaging work up negative for stroke.  CXR showed There is stable elevation of the left hemidiaphragm with left base atelectasis.  Pt is undergoing EEG and ECHO as well. Type of Study: Bedside Swallow Evaluation Diet Prior to this Study: NPO Temperature Spikes Noted: No Respiratory Status: Room air History of Recent  Intubation: No Behavior/Cognition: Alert;Cooperative;Pleasant mood Oral Cavity Assessment: Dried secretions Oral Care Completed by SLP: Yes (provided pt with toothbrush and had him brush his teeth) Oral Cavity - Dentition: Adequate natural dentition Vision: Functional for self-feeding Self-Feeding Abilities: Able to feed self Patient Positioning: Upright in bed Baseline Vocal Quality: Normal Volitional Swallow: Able to elicit    Oral/Motor/Sensory Function Overall Oral Motor/Sensory Function: Mild impairment (eyebrow asymmetry noted, has h/o chalazion, otherwise no focal CN deficit)   Ice Chips Ice chips: Not tested   Thin Liquid Thin Liquid: Impaired Presentation: Cup;Self Fed;Straw Pharyngeal  Phase Impairments: Cough - Immediate Other Comments: Cough x2 when initially starting po intake however after consumption of other boluses - sandwich, applesauce - no other coughing with water intake via cup.    Nectar Thick Nectar Thick Liquid: Within functional limits Presentation: Cup;Self Fed;Straw   Honey Thick Honey Thick Liquid: Not tested   Puree Puree: Within functional limits Presentation: Self Fed;Spoon   Solid     Solid: Impaired Oral Phase Functional Implications: Other (comment) Pharyngeal Phase Impairments: Cough - Delayed Other Comments: Cough x1 when taking large boluses - EEG staff in room setting up, therefore suspect pt feeling rushed. Small single bites tolerated without coughing      Macario Golds 09/14/2020,2:10 PM   Kathleen Lime, MS [facility] SLP [place] Office [phone] Pager [phone]

## 2020-09-14 NOTE — Progress Notes (Signed)
Pharmacy Antibiotic Note  Tommy Bauer is a 79 y.o. male admitted on 09/13/2020 with sepsis.  Pharmacy has been consulted for vancomycin and cefepime dosing.  Plan: D2 abx - sepsis  Vanc total 2gm load, then 750mg  q12 Cefepime 2gm q12 > q8h SCr decreased   Height: 6\' 2"  (188 cm) Weight: 83.3 kg (183 lb 10.3 oz) IBW/kg (Calculated) : 82.2  Temp (24hrs), Avg:98.1 F (36.7 C), Min:97.3 F (36.3 C), Max:98.6 F (37 C)  Recent Labs  Lab 09/13/20 1052 09/13/20 1201 09/13/20 1400 09/13/20 1739 09/13/20 2302 09/14/20 0413  WBC 18.8*  --   --   --   --  15.4*  CREATININE  --  1.41*  --   --   --  1.14  LATICACIDVEN 5.2*  --  5.8* 2.4* 2.2*  --     Estimated Creatinine Clearance: 61.1 mL/min (by C-G formula based on SCr of 1.14 mg/dL).    No Known Allergies  Antimicrobials this admission: 11/14 vancomycin >>  11/14 cefepime >>   Dose adjustments this admission: 11/15 Cefepime 2gm q12 > q8h  Microbiology results: 11/14 BCx: sent 11/14 UCx:  sent 11/14 resp panel:  Neg Flu/Covid 11/14 MRSA PCR: neg  Thank you for allowing pharmacy to be a part of this patient's care.  Minda Ditto PharmD [facility] [phone] 09/14/2020, 9:34 AM

## 2020-09-14 NOTE — Progress Notes (Signed)
  Echocardiogram 2D Echocardiogram has been performed.  Jannett Celestine 09/14/2020, 2:39 PM

## 2020-09-14 NOTE — Progress Notes (Signed)
PROGRESS NOTE    Tommy Bauer  EFE:[id number] DOB: 17-Dec-1940 DOA: 09/13/2020 PCP: [name], NP    Chief Complaint  Patient presents with  . Altered Mental Status    Brief Narrative:  HPI per Dr. [name] This is a 79 y.o. male with a past medical history of prostate cancer in 2011, CVA, hypertension, alcohol use, hyperlipidemia, central retinal artery occlusion who presented to the ED via EMS today after his ex-wife found him on the floor.  Per the patient's wife who checks in on him occasionally, she knocked on his door 3 days ago but he did not answer and she called 911 for a wellness check.  He did eventually walk to the door and reported that he was fine and did not want to go to the hospital.  She went to check on him again today but he did not answer the door.  His front door was open but the screen door was locked and she noted the house to be very cold.  She called for assistance again and was able to get in the house and he was found laying in his bedroom on his right side prompting the patient to be brought to the ED.  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  The patient does not recall these events and is unable to recall what happened over the past couple days.  He denies any complaints at this time.  ED Course: Hypothermic with Bair Hugger, tachycardic, tachypneic, hypertensive.  Notable Labs: Glucose 160, BUN 53, creatinine 1.41, albumin 3.1, AST 61, ALT 39, T bili 1.3, CK 1836, troponin 43-> 45, WBC 18.8, Hb 19.9, platelets 176, lactic acid 5.2, UA overall unremarkable. Notable Imaging: CXR, CT head, CT cervical spine, pelvis x-ray overall unremarkable for acute findings.. Patient received IV fluids per sepsis protocol, vancomycin, metronidazole and cefepime.   Assessment & Plan:   Principal Problem:   Sepsis ([place]) Active Problems:   Bacteremia due to Gram-positive bacteria   Essential hypertension   Central retinal artery occlusion   AMS (altered mental status)   AKI (acute  kidney injury) ([place])   Conjunctivitis   Rhabdomyolysis  1 sepsis likely secondary to a gram-positive bacteremia/staph bacteremia Patient on admission met criteria for sepsis with hypothermia which required a bear hugger, tachycardia, tachypnea, leukocytosis, lactic acidosis as high as 5.8.  Blood cultures obtained with gram-positive bacteremia in clusters.  BC ID positive for staph.  MRI brain unremarkable.  Urinalysis nitrite negative leukocytes -6-10 WBCs.  Urine cultures negative.  Patient improving clinically.  Check a 2D echo.  Continue IV vancomycin.  Discontinue IV cefepime.  Will consult with ID tomorrow 09/15/2020 for further evaluation and recommendations.  Follow.  2.  Left eye conjunctivitis Likely bacterial.  Patient with history of central retinal artery occlusion.  Continue erythromycin ointment and eye care.  Outpatient follow-up.  3.  Mild rhabdomyolysis IV fluids.  Repeat CK levels in the morning.  4.  Acute metabolic encephalopathy Likely secondary to problem #1 and dehydration.  Head CT unremarkable.  On admission there was concern that patient may have bitten his tongue and as such seizures a part of the differential.  MRI brain with no acute abnormalities.  Patient denies any syncopal episodes.  EEG pending.  TSH within normal limits.  Continue IV fluids, empiric IV antibiotics.  Follow.  5.  Acute kidney injury Likely secondary to prerenal azotemia in the setting of ACE inhibitor.  Renal function improved with hydration.  Follow.  6.  Hypertension Stable.  Continue Toprol-XL.  ACE inhibitor on hold secondary to acute kidney injury and dehydration.  7.  History of CVA Stable.  Continue aspirin and statin.  Risk factor modification.  8.  History of alcohol use Unknown last drink.  Alcohol level negative.  Monitor closely.  Follow.   DVT prophylaxis: Lovenox Code Status: Full Family Communication: Updated patient and ex-wife at bedside. Disposition:   Status is:  Inpatient    Dispo: The patient is from: Home              Anticipated d/c is to: To be determined              Anticipated d/c date is: 4 to 5 days              Patient currently on IV antibiotics, concern for gram-positive bacteremia.  Not stable for discharge.       Consultants:   None  Procedures:   MRI brain 09/14/2020  CT head CT C-spine 09/13/2020  Chest x-ray 09/13/2020  Plain films of the pelvis 09/13/2020  2D echo pending 09/14/2020  Antimicrobials:   IV vancomycin 09/13/2020>>>>  IV cefepime 09/13/2020>>>> 09/14/2020  Erythromycin ophthalmic ointment 09/13/2020>>>> 09/18/2020   Subjective: Patient alert sitting in bed.  Ex-wife clipping his nails.  Patient denies any chest pain.  No shortness of breath.  Feeling better than on admission.  Alert to self and place.  Patient denies any syncopal episode prior to admission and just complained of some significant generalized weakness to the point he fell on the ground.  Patient unable to state how long he was on the floor for.  Objective: Vitals:   09/14/20 0217 09/14/20 0500 09/14/20 0602 09/14/20 0900  BP: (!) 173/100  137/86 (!) 154/88  Pulse: 95  84 80  Resp: _0 Temp: 98.1 F (36.7 C)  98.6 F (37 C) 97.9 F (36.6 C)  TempSrc: Oral  Oral Axillary  SpO2: 96%  96% 91%  Weight:  83.3 kg    Height:        Intake/Output Summary (Last 24 hours) at 09/14/2020 1222 Last data filed at 09/14/2020 0700 Gross per 24 hour  Intake 3500 ml  Output 300 ml  Net 3200 ml   Filed Weights   09/13/20 1314 09/13/20 2110 09/14/20 0500  Weight: 97.5 kg 83.4 kg 83.3 kg    Examination:  General exam: Alert.  Left eye conjunctiva injected.  Some exudate. Respiratory system: Clear to auscultation. Respiratory effort normal. Cardiovascular system: S1 & S2 heard, RRR. No JVD, murmurs, rubs, gallops or clicks. No pedal edema. Gastrointestinal system: Abdomen is nondistended, soft and nontender. No  organomegaly or masses felt. Normal bowel sounds heard. Central nervous system: Alert and oriented. No focal neurological deficits. Extremities: Symmetric 5 x 5 power. Skin: No rashes, lesions or ulcers Psychiatry: Judgement and insight appear normal. Mood & affect appropriate.     Data Reviewed: I have personally reviewed following labs and imaging studies  CBC: Recent Labs  Lab 09/13/20 1052 09/14/20 0413  WBC 18.8* 15.4*  NEUTROABS 15.6*  --   HGB 19.9* 16.5  HCT 62.1* 52.6*  MCV 92.3 92.9  PLT 176 347    Basic Metabolic Panel: Recent Labs  Lab 09/13/20 1201 09/13/20 1205 09/14/20 0413  NA 144  --  144  K 4.2  --  3.9  CL 101  --  107  CO2 29  --  25  GLUCOSE 160*  --  119*  BUN 53*  --  49*  CREATININE 1.41*  --  1.14  CALCIUM 9.1  --  8.6*  MG  --  3.0*  --     GFR: Estimated Creatinine Clearance: 61.1 mL/min (by C-G formula based on SCr of 1.14 mg/dL).  Liver Function Tests: Recent Labs  Lab 09/13/20 1201  AST 61*  ALT 39  ALKPHOS 43  BILITOT 1.3*  PROT 7.7  ALBUMIN 3.1*    CBG: Recent Labs  Lab 09/13/20 1113  GLUCAP 99     Recent Results (from the past 240 hour(s))  Urine culture     Status: Abnormal   Collection Time: 09/13/20 10:52 AM   Specimen: In/Out Cath Urine  Result Value Ref Range Status   Specimen Description   Final    IN/OUT CATH URINE Performed at [facility] [address], [place], [place] [address]    Special Requests   Final    URINE, RANDOM Performed at [facility] [address] [place], [place] [address]    Culture (A)  Final    <10,000 COLONIES/mL INSIGNIFICANT GROWTH Performed at [facility] [address], [place], [place] [address]    Report Status 09/14/2020 FINAL  Final  Blood Culture (routine x 2)     Status: None (Preliminary result)   Collection Time: 09/13/20 10:52 AM   Specimen: BLOOD  Result Value Ref Range Status   Specimen Description   Final     BLOOD LEFT HAND Performed at [facility] [address], [place], [place] [address]    Special Requests   Final    BOTTLES DRAWN AEROBIC AND ANAEROBIC Blood Culture results may not be optimal due to an inadequate volume of blood received in culture bottles Performed at [facility] [address], [place], [place] [address]    Culture   Final    NO GROWTH < 24 HOURS Performed at [facility] [address], [place], [place] [address]    Report Status PENDING  Incomplete  Blood Culture (routine x 2)     Status: None (Preliminary result)   Collection Time: 09/13/20 10:52 AM   Specimen: BLOOD  Result Value Ref Range Status   Specimen Description   Final    BLOOD LEFT ANTECUBITAL Performed at [facility] [address], [place],  [address]    Special Requests   Final    BOTTLES DRAWN AEROBIC AND ANAEROBIC Blood Culture results may not be optimal due to an inadequate volume of blood received in culture bottles Performed at [facility] [address] [place],  [address]    Culture  Setup Time   Final    GRAM POSITIVE COCCI IN CLUSTERS ANAEROBIC BOTTLE ONLY Organism ID to follow CRITICAL RESULT CALLED TO, READ BACK BY AND VERIFIED WITH: D. Wofford PharmD 12:10 09/14/20 ([email or web address])    Culture   Final    NO GROWTH < 24 HOURS Performed at [facility], [place] [address], [place],  [address]    Report Status PENDING  Incomplete  Blood Culture ID Panel (Reflexed)     Status: Abnormal   Collection Time: 09/13/20 10:52 AM  Result Value Ref Range Status   Enterococcus faecalis NOT DETECTED NOT DETECTED Final   Enterococcus Faecium NOT DETECTED NOT DETECTED Final   Listeria monocytogenes NOT DETECTED NOT DETECTED Final   Staphylococcus species DETECTED (A) NOT DETECTED Final  Comment: CRITICAL RESULT CALLED TO, READ BACK BY AND VERIFIED WITH: D. Wofford PharmD 12:10  09/14/20 ([email or web address])    Staphylococcus aureus (BCID) NOT DETECTED NOT DETECTED Final   Staphylococcus epidermidis NOT DETECTED NOT DETECTED Final   Staphylococcus lugdunensis NOT DETECTED NOT DETECTED Final   Streptococcus species NOT DETECTED NOT DETECTED Final   Streptococcus agalactiae NOT DETECTED NOT DETECTED Final   Streptococcus pneumoniae NOT DETECTED NOT DETECTED Final   Streptococcus pyogenes NOT DETECTED NOT DETECTED Final   A.calcoaceticus-baumannii NOT DETECTED NOT DETECTED Final   Bacteroides fragilis NOT DETECTED NOT DETECTED Final   Enterobacterales NOT DETECTED NOT DETECTED Final   Enterobacter cloacae complex NOT DETECTED NOT DETECTED Final   Escherichia coli NOT DETECTED NOT DETECTED Final   Klebsiella aerogenes NOT DETECTED NOT DETECTED Final   Klebsiella oxytoca NOT DETECTED NOT DETECTED Final   Klebsiella pneumoniae NOT DETECTED NOT DETECTED Final   Proteus species NOT DETECTED NOT DETECTED Final   Salmonella species NOT DETECTED NOT DETECTED Final   Serratia marcescens NOT DETECTED NOT DETECTED Final   Haemophilus influenzae NOT DETECTED NOT DETECTED Final   Neisseria meningitidis NOT DETECTED NOT DETECTED Final   Pseudomonas aeruginosa NOT DETECTED NOT DETECTED Final   Stenotrophomonas maltophilia NOT DETECTED NOT DETECTED Final   Candida albicans NOT DETECTED NOT DETECTED Final   Candida auris NOT DETECTED NOT DETECTED Final   Candida glabrata NOT DETECTED NOT DETECTED Final   Candida krusei NOT DETECTED NOT DETECTED Final   Candida parapsilosis NOT DETECTED NOT DETECTED Final   Candida tropicalis NOT DETECTED NOT DETECTED Final   Cryptococcus neoformans/gattii NOT DETECTED NOT DETECTED Final    Comment: Performed at [facility], [address], [place], [place] [address]  Resp Panel by RT PCR (RSV, Flu A&B, Covid) - Nasopharyngeal Swab     Status: None   Collection Time: 09/13/20 11:37 AM   Specimen: Nasopharyngeal Swab  Result Value Ref Range Status     SARS Coronavirus 2 by RT PCR NEGATIVE NEGATIVE Final    Comment: (NOTE) SARS-CoV-2 target nucleic acids are NOT DETECTED.  The SARS-CoV-2 RNA is generally detectable in upper respiratoy specimens during the acute phase of infection. The lowest concentration of SARS-CoV-2 viral copies this assay can detect is 131 copies/mL. A negative result does not preclude SARS-Cov-2 infection and should not be used as the sole basis for treatment or other patient management decisions. A negative result may occur with  improper specimen collection/handling, submission of specimen other than nasopharyngeal swab, presence of viral mutation(s) within the areas targeted by this assay, and inadequate number of viral copies (<131 copies/mL). A negative result must be combined with clinical observations, patient history, and epidemiological information. The expected result is Negative.  Fact Sheet for Patients:  PinkCheek.be  Fact Sheet for Healthcare Providers:  GravelBags.it  This test is no t yet approved or cleared by the [place] FDA and  has been authorized for detection and/or diagnosis of SARS-CoV-2 by FDA under an Emergency Use Authorization (EUA). This EUA will remain  in effect (meaning this test can be used) for the duration of the COVID-19 declaration under Section 564(b)(1) of the Act, 21 U.S.C. section 360bbb-3(b)(1), unless the authorization is terminated or revoked sooner.     Influenza A by PCR NEGATIVE NEGATIVE Final   Influenza B by PCR NEGATIVE NEGATIVE Final    Comment: (NOTE) The Xpert Xpress SARS-CoV-2/FLU/RSV assay is intended as an aid in  the diagnosis of influenza from Nasopharyngeal swab  specimens and  should not be used as a sole basis for treatment. Nasal washings and  aspirates are unacceptable for Xpert Xpress SARS-CoV-2/FLU/RSV  testing.  Fact Sheet for  Patients: PinkCheek.be  Fact Sheet for Healthcare Providers: GravelBags.it  This test is not yet approved or cleared by the [place] FDA and  has been authorized for detection and/or diagnosis of SARS-CoV-2 by  FDA under an Emergency Use Authorization (EUA). This EUA will remain  in effect (meaning this test can be used) for the duration of the  Covid-19 declaration under Section 564(b)(1) of the Act, 21  U.S.C. section 360bbb-3(b)(1), unless the authorization is  terminated or revoked.    Respiratory Syncytial Virus by PCR NEGATIVE NEGATIVE Final    Comment: (NOTE) Fact Sheet for Patients: PinkCheek.be  Fact Sheet for Healthcare Providers: GravelBags.it  This test is not yet approved or cleared by the [place] FDA and  has been authorized for detection and/or diagnosis of SARS-CoV-2 by  FDA under an Emergency Use Authorization (EUA). This EUA will remain  in effect (meaning this test can be used) for the duration of the  COVID-19 declaration under Section 564(b)(1) of the Act, 21 U.S.C.  section 360bbb-3(b)(1), unless the authorization is terminated or  revoked. Performed at [facility], [place] [address], [place], [place] [address]   MRSA PCR Screening     Status: None   Collection Time: 09/13/20  6:05 PM   Specimen: Nasopharyngeal  Result Value Ref Range Status   MRSA by PCR NEGATIVE NEGATIVE Final    Comment:        The GeneXpert MRSA Assay (FDA approved for NASAL specimens only), is one component of a comprehensive MRSA colonization surveillance program. It is not intended to diagnose MRSA infection nor to guide or monitor treatment for MRSA infections. Performed at [facility], [place] [address], [place],  [address]          Radiology Studies: CT Head Wo Contrast  Result Date:  09/13/2020 CLINICAL DATA:  Mental status change. EXAM: CT HEAD WITHOUT CONTRAST TECHNIQUE: Contiguous axial images were obtained from the base of the skull through the vertex without intravenous contrast. COMPARISON:  None. FINDINGS: Brain: No evidence of acute infarction, hemorrhage, hydrocephalus, extra-axial collection or mass lesion/mass effect. Moderate brain parenchymal volume loss and deep white matter microangiopathy. Vascular: Heavy calcific atherosclerotic disease of the intracranial arteries. Skull: Normal. Negative for fracture or focal lesion. Sinuses/Orbits: Near complete opacification of the right maxillary sinus. Partial opacification of the ethmoid sinuses and left frontal sinus. Other: None. IMPRESSION: 1. No acute intracranial abnormality. 2. Moderate brain parenchymal atrophy and chronic microvascular disease. 3. Chronic sinusitis. Electronically Signed   By: Fidela Salisbury M.D.   On: 09/13/2020 12:30   CT Cervical Spine Wo Contrast  Result Date: 09/13/2020 CLINICAL DATA:  Unwitnessed fall.  Mental status change. EXAM: CT CERVICAL SPINE WITHOUT CONTRAST TECHNIQUE: Multidetector CT imaging of the cervical spine was performed without intravenous contrast. Multiplanar CT image reconstructions were also generated. COMPARISON:  None. FINDINGS: Alignment: Straightening of the cervical lordosis. Skull base and vertebrae: No acute fracture. No primary bone lesion or focal pathologic process. 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 Soft tissues and spinal canal: No prevertebral fluid or swelling. No visible canal hematoma. Disc levels:  Multilevel osteoarthritic changes. Upper chest: Negative. Other: None. IMPRESSION: 1. No evidence of acute traumatic injury to the cervical spine. 2. 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 3.  Multilevel osteoarthritic changes of the cervical spine. Electronically Signed   By: Fidela Salisbury M.D.   On:  09/13/2020 12:35   MR BRAIN WO CONTRAST  Result Date: 09/14/2020 CLINICAL DATA:  Mental status change with unknown cause, possible CVA EXAM: MRI HEAD WITHOUT CONTRAST TECHNIQUE: Multiplanar, multiecho pulse sequences of the brain and surrounding structures were obtained without intravenous contrast. COMPARISON:  Head CT from yesterday FINDINGS: Brain: No acute infarction, hemorrhage, hydrocephalus, extra-axial collection or mass lesion. Brain atrophy most notable in the parietal and temporal areas. Confluent chronic small vessel ischemia to a moderate degree in the periventricular white matter. Vascular: Preserved flow voids for technique. Skull and upper cervical spine: No focal marrow lesion. Right more than left parietal scalp swelling. Sinuses/Orbits: Large retention cyst appearance in the right maxillary sinus. There is also opacification of a right anterior ethmoid air cell. IMPRESSION: 1. Motion degraded study without acute intracranial finding. 2. Brain atrophy and moderate chronic small vessel ischemia. Electronically Signed   By: Monte Fantasia M.D.   On: 09/14/2020 08:40   DG Pelvis Portable  Result Date: 09/13/2020 CLINICAL DATA:  Fall EXAM: PORTABLE PELVIS 1-2 VIEWS COMPARISON:  September 22, 2009 FINDINGS: There is no evidence of pelvic fracture or dislocation slight symmetric narrowing of each hip joint. No erosive change. IMPRESSION: Slight symmetric narrowing of each hip joint. No fracture or dislocation. Electronically Signed   By: Lowella Grip III M.D.   On: 09/13/2020 13:49   DG Chest Port 1 View  Result Date: 09/13/2020 CLINICAL DATA:  Hypertension with concern for sepsis EXAM: PORTABLE CHEST 1 VIEW COMPARISON:  September 11, 2009. FINDINGS: There is stable elevation of the left hemidiaphragm with left base atelectasis. The lungs elsewhere are clear. Heart size and pulmonary vascularity are normal. No adenopathy. There is degenerative change in the thoracic spine. IMPRESSION:  Stable elevation of the left hemidiaphragm with left base atelectasis. Lungs elsewhere clear. Stable cardiac silhouette. Electronically Signed   By: Lowella Grip III M.D.   On: 09/13/2020 12:17        Scheduled Meds: . aspirin EC  81 mg Oral Daily  . atorvastatin  40 mg Oral Daily  . chlorhexidine  15 mL Mouth Rinse BID  . enoxaparin (LOVENOX) injection  40 mg Subcutaneous Q24H  . erythromycin   Left Eye QID  . mouth rinse  15 mL Mouth Rinse q12n4p  . metoprolol succinate  100 mg Oral Daily  . sodium chloride flush  3 mL Intravenous Q12H   Continuous Infusions: . sodium chloride 125 mL/hr at 09/14/20 1059  . ceFEPime (MAXIPIME) IV 2 g (09/14/20 1052)  . vancomycin 750 mg (09/14/20 4888)     LOS: 1 day    Time spent: 45 minutes    Irine Seal, MD [facility]   To contact the attending provider between 7A-7P or the covering provider during after hours 7P-7A, please log into the web site [email or web address] and access using universal [place] password for that web site. If you do not have the password, please call the hospital operator.  09/14/2020, 12:22 PM

## 2020-09-14 NOTE — Procedures (Addendum)
Patient Name: Tommy Bauer  MRN: [id number]  Epilepsy Attending: Lora Havens  Referring Physician/Provider: Dr. Marva Panda Date: 11/15/201 Duration: 23.16 mins  Patient history: 79 year old male with altered mental status.  Level of alertness: Awake  AEDs during EEG study: None  Technical aspects: This EEG study was done with scalp electrodes positioned according to the 10-20 International system of electrode placement. Electrical activity was acquired at a sampling rate of 500Hz  and reviewed with a high frequency filter of 70Hz  and a low frequency filter of 1Hz . EEG data were recorded continuously and digitally stored.   Description: The posterior dominant rhythm consists of 8 Hz activity of moderate voltage (25-35 uV) seen predominantly in posterior head regions, symmetric and reactive to eye opening and eye closing.  EEG showed intermittent 3 to 5 Hz theta-delta slowing again left more than right temporal region.  Hyperventilation and photic stimulation were not performed.     ABNORMALITY -Intermittent slow, left more than right temporal region  IMPRESSION: This study is suggestive of nonspecific cortical dysfunction in left more than right temporal region.  No seizures or epileptiform discharges were seen throughout the recording.  Shayma Pfefferle Barbra Sarks

## 2020-09-14 NOTE — Progress Notes (Signed)
EEG completed, results pending. 

## 2020-09-15 ENCOUNTER — Other Ambulatory Visit: Payer: Self-pay

## 2020-09-15 ENCOUNTER — Encounter (HOSPITAL_COMMUNITY): Payer: Self-pay | Admitting: Internal Medicine

## 2020-09-15 DIAGNOSIS — R7881 Bacteremia: Secondary | ICD-10-CM | POA: Diagnosis not present

## 2020-09-15 LAB — BASIC METABOLIC PANEL
Anion gap: 10 (ref 5–15)
BUN: 36 mg/dL — ABNORMAL HIGH (ref 8–23)
CO2: 24 mmol/L (ref 22–32)
Calcium: 8.2 mg/dL — ABNORMAL LOW (ref 8.9–10.3)
Chloride: 107 mmol/L (ref 98–111)
Creatinine, Ser: 1.12 mg/dL (ref 0.61–1.24)
GFR, Estimated: >60 mL/min (ref >60)
Glucose, Bld: 101 mg/dL — ABNORMAL HIGH (ref 70–99)
Potassium: 5.1 mmol/L (ref 3.5–5.1)
Sodium: 141 mmol/L (ref 135–145)

## 2020-09-15 LAB — CBC
HCT: 49.8 % (ref 39.0–52.0)
Hemoglobin: 15.5 g/dL (ref 13.0–17.0)
MCH: 29.8 pg (ref 26.0–34.0)
MCHC: 31.1 g/dL (ref 30.0–36.0)
MCV: 95.8 fL (ref 80.0–100.0)
Platelets: 131 10*3/uL — ABNORMAL LOW (ref 150–400)
RBC: 5.2 MIL/uL (ref 4.22–5.81)
RDW: 13.8 % (ref 11.5–15.5)
WBC: 18.7 10*3/uL — ABNORMAL HIGH (ref 4.0–10.5)
nRBC: 0 % (ref 0.0–0.2)

## 2020-09-15 LAB — CK: Total CK: 683 U/L — ABNORMAL HIGH (ref 49–397)

## 2020-09-15 MED ORDER — NICOTINE 21 MG/24HR TD PT24
21.0000 mg | MEDICATED_PATCH | Freq: Every day | TRANSDERMAL | Status: DC
  Administered 2020-09-15 – 2020-09-17 (×3): 21 mg via TRANSDERMAL
  Filled 2020-09-15 (×3): qty 1

## 2020-09-15 MED ORDER — MOMETASONE FURO-FORMOTEROL FUM 100-5 MCG/ACT IN AERO
2.0000 | INHALATION_SPRAY | Freq: Two times a day (BID) | RESPIRATORY_TRACT | Status: DC
  Administered 2020-09-15 – 2020-09-17 (×5): 2 via RESPIRATORY_TRACT
  Filled 2020-09-15: qty 8.8

## 2020-09-15 MED ORDER — UMECLIDINIUM BROMIDE 62.5 MCG/INH IN AEPB
1.0000 | INHALATION_SPRAY | Freq: Every day | RESPIRATORY_TRACT | Status: DC
  Administered 2020-09-15 – 2020-09-17 (×3): 1 via RESPIRATORY_TRACT
  Filled 2020-09-15: qty 7

## 2020-09-15 MED ORDER — IPRATROPIUM-ALBUTEROL 0.5-2.5 (3) MG/3ML IN SOLN: 3.0000 mL | RESPIRATORY_TRACT | Status: DC | PRN

## 2020-09-15 NOTE — Progress Notes (Addendum)
PROGRESS NOTE    Tommy Bauer  HOO:[id number] DOB: Nov 28, 1940 DOA: 09/13/2020 PCP: [name], NP    Chief Complaint  Patient presents with  . Altered Mental Status    Brief Narrative:  HPI per Dr. [name] This is a 79 y.o. male with a past medical history of prostate cancer in 2011, CVA, hypertension, alcohol use, hyperlipidemia, central retinal artery occlusion who presented to the ED via EMS today after his ex-wife found him on the floor.  Per the patient's wife who checks in on him occasionally, she knocked on his door 3 days ago but he did not answer and she called 911 for a wellness check.  He did eventually walk to the door and reported that he was fine and did not want to go to the hospital.  She went to check on him again today but he did not answer the door.  His front door was open but the screen door was locked and she noted the house to be very cold.  She called for assistance again and was able to get in the house and he was found laying in his bedroom on his right side prompting the patient to be brought to the ED.  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  The patient does not recall these events and is unable to recall what happened over the past couple days.  He denies any complaints at this time.  ED Course: Hypothermic with Bair Hugger, tachycardic, tachypneic, hypertensive.  Notable Labs: Glucose 160, BUN 53, creatinine 1.41, albumin 3.1, AST 61, ALT 39, T bili 1.3, CK 1836, troponin 43-> 45, WBC 18.8, Hb 19.9, platelets 176, lactic acid 5.2, UA overall unremarkable. Notable Imaging: CXR, CT head, CT cervical spine, pelvis x-ray overall unremarkable for acute findings.. Patient received IV fluids per sepsis protocol, vancomycin, metronidazole and cefepime.   Assessment & Plan:   Principal Problem:   Sepsis ([place]) Active Problems:   Bacteremia due to Gram-positive bacteria   Essential hypertension   Central retinal artery occlusion   AMS (altered mental status)   AKI (acute  kidney injury) ([place])   Conjunctivitis   Rhabdomyolysis  1 sepsis likely secondary to a gram-positive bacteremia/staph bacteremia Patient on admission met criteria for sepsis with hypothermia which required a bear hugger, tachycardia, tachypnea, leukocytosis, lactic acidosis as high as 5.8.  Blood cultures obtained with Staphylococcus hominis in 1 blood culture and another blood culture with GPC. BC ID positive for staph.  MRI brain unremarkable.  Urinalysis nitrite negative leukocytes -6-10 WBCs.  Urine cultures negative.  Patient improving clinically.  2D echo with normal EF, no signs of vegetation noted.  IV cefepime has been discontinued.  Continue IV vancomycin.  Due to concerns for bacteremia consult with ID for further evaluation and management.  2.  Left eye conjunctivitis Likely bacterial.  Patient with history of central retinal artery occlusion.  Continue erythromycin ointment and eye care.  Outpatient follow-up.   3.  Mild rhabdomyolysis Improved with hydration.  Follow.    4.  Acute metabolic encephalopathy Likely secondary to problem #1 and dehydration.  Head CT unremarkable.  On admission there was concern that patient may have bitten his tongue and as such seizures a part of the differential.  MRI brain with no acute abnormalities.  Patient denies any syncopal episodes.  EEG suggestive of nonspecific cortical dysfunction L > R in the temporal region.  No seizures or epileptiform discharges seen throughout the recording.  Findings discussed with neurology who reviewed  imaging with me and felt patient likely did not have a seizure and at this time does not need to be placed on any antiepileptic medications.  If patient is to have another seizure during this hospitalization would warrant further evaluation by neurology and a formal neurology consult.  TSH within normal limits.  Continue IV fluids, empiric IV antibiotics.  Follow.  5.  Acute kidney injury Secondary to prerenal azotemia in  the setting of ACE inhibitor.  Renal function improved with hydration.  Continue to hold ACE inhibitor.  Follow.    6.  Hypertension Currently controlled.  Continue Toprol-XL.  Continue to hold ACE inhibitor as patient had presented with dehydration and acute kidney injury.  Follow.    7.  History of CVA Continue aspirin, statin.  Risk factor modification.  8.  History of alcohol use Unknown last drink.  Alcohol level negative.  No signs of withdrawal.  Monitor closely.  Follow.  9.  Abnormal EEG EEG suggestive of nonspecific cortical dysfunction L > R in the temporal region.  No seizures or epileptiform discharges seen throughout the recording.  Findings discussed with neurology who reviewed imaging with me and felt patient likely did not have a seizure and at this time does not need to be placed on any antiepileptic medications.  If patient is to have another seizure during this hospitalization would warrant further evaluation by neurology and a formal neurology consult.  Monitor for now.  10.  Tobacco abuse Patient with ongoing tobacco use.  States he understands the need for tobacco cessation.  Placed on nicotine patch.  Placed on Incruse, Dulera.  Duo nebs as needed.   DVT prophylaxis: Lovenox Code Status: Full Family Communication: Updated patient.  No family at bedside. Disposition:   Status is: Inpatient    Dispo: The patient is from: Home              Anticipated d/c is to: SNF              Anticipated d/c date is: 4 to 5 days              Patient currently on IV antibiotics, concern for gram-positive bacteremia.  Not stable for discharge.       Consultants:   ID pending  Procedures:   MRI brain 09/14/2020  CT head CT C-spine 09/13/2020  Chest x-ray 09/13/2020  Plain films of the pelvis 09/13/2020  2D echo 09/14/2020  EEG 09/14/2020  Antimicrobials:   IV vancomycin 09/13/2020>>>>  IV cefepime 09/13/2020>>>> 09/14/2020  Erythromycin ophthalmic  ointment 09/13/2020>>>> 09/18/2020   Subjective: Patient sitting up in bed.  Patient states he is feeling better than on admission.  Denies any chest pain or shortness of breath.  No focal seizures noted.  Objective: Vitals:   09/14/20 0900 09/14/20 1241 09/14/20 2123 09/15/20 0606  BP: (!) 154/88 (!) 141/83 139/83 (!) 134/99  Pulse: 80 79 71 98  Resp: _0 Temp: 97.9 F (36.6 C) 97.7 F (36.5 C) 99 F (37.2 C) 100 F (37.8 C)  TempSrc: Axillary Oral Oral Oral  SpO2: 91% 94% 90% (!) 88%  Weight:      Height:        Intake/Output Summary (Last 24 hours) at 09/15/2020 1315 Last data filed at 09/15/2020 0600 Gross per 24 hour  Intake 2220.47 ml  Output 1225 ml  Net 995.47 ml   Filed Weights   09/13/20 1314 09/13/20 2110 09/14/20 0500  Weight: 97.5 kg 83.4  kg 83.3 kg    Examination:  General exam: Alert.  Left eye conjunctive are injected.  Respiratory system: CTAB.  No wheezes, no crackles, no rhonchi.  Normal respiratory effort.  Cardiovascular system: Regular rate rhythm no murmurs rubs or gallops.  No JVD.  No lower extremity edema. Gastrointestinal system: Abdomen is soft, nontender, nondistended, positive bowel sounds.  No rebound.  No guarding.  Central nervous system: Alert and oriented. No focal neurological deficits. Extremities: Symmetric 5 x 5 power. Skin: No rashes, lesions or ulcers Psychiatry: Judgement and insight appear normal. Mood & affect appropriate.     Data Reviewed: I have personally reviewed following labs and imaging studies  CBC: Recent Labs  Lab 09/13/20 1052 09/14/20 0413 09/15/20 0425  WBC 18.8* 15.4* 18.7*  NEUTROABS 15.6*  --   --   HGB 19.9* 16.5 15.5  HCT 62.1* 52.6* 49.8  MCV 92.3 92.9 95.8  PLT 176 158 131*    Basic Metabolic Panel: Recent Labs  Lab 09/13/20 1201 09/13/20 1205 09/14/20 0413 09/15/20 0425  NA 144  --  144 141  K 4.2  --  3.9 5.1  CL 101  --  107 107  CO2 29  --  25 24  GLUCOSE 160*  --   119* 101*  BUN 53*  --  49* 36*  CREATININE 1.41*  --  1.14 1.12  CALCIUM 9.1  --  8.6* 8.2*  MG  --  3.0*  --   --     GFR: Estimated Creatinine Clearance: 62.2 mL/min (by C-G formula based on SCr of 1.12 mg/dL).  Liver Function Tests: Recent Labs  Lab 09/13/20 1201  AST 61*  ALT 39  ALKPHOS 43  BILITOT 1.3*  PROT 7.7  ALBUMIN 3.1*    CBG: Recent Labs  Lab 09/13/20 1113  GLUCAP 99     Recent Results (from the past 240 hour(s))  Urine culture     Status: Abnormal   Collection Time: 09/13/20 10:52 AM   Specimen: In/Out Cath Urine  Result Value Ref Range Status   Specimen Description   Final    IN/OUT CATH URINE Performed at [facility] [address] [place], [place] [address]    Special Requests   Final    URINE, RANDOM Performed at [facility] [address] [place], [place] [address]    Culture (A)  Final    <10,000 COLONIES/mL INSIGNIFICANT GROWTH Performed at [facility] [address] [place], [place] [address]    Report Status 09/14/2020 FINAL  Final  Blood Culture (routine x 2)     Status: None (Preliminary result)   Collection Time: 09/13/20 10:52 AM   Specimen: BLOOD  Result Value Ref Range Status   Specimen Description   Final    BLOOD LEFT HAND Performed at [facility] [address], [place], [place] [address]    Special Requests   Final    BOTTLES DRAWN AEROBIC AND ANAEROBIC Blood Culture results may not be optimal due to an inadequate volume of blood received in culture bottles Performed at [facility] [address] [place], [place] [address]    Culture  Setup Time   Final    GRAM POSITIVE COCCI ANAEROBIC BOTTLE ONLY CRITICAL VALUE NOTED.  VALUE IS CONSISTENT WITH PREVIOUSLY REPORTED AND CALLED VALUE.    Culture   Final    NO GROWTH < 24 HOURS Performed at [facility], [address] [place], [place] [address]    Report Status  PENDING  Incomplete  Blood Culture (routine x 2)     Status: Abnormal (Preliminary result)   Collection Time: 09/13/20 10:52 AM   Specimen: BLOOD  Result Value Ref Range Status   Specimen Description   Final    BLOOD LEFT ANTECUBITAL Performed at [facility] [address] [place], [place] [address]    Special Requests   Final    BOTTLES DRAWN AEROBIC AND ANAEROBIC Blood Culture results may not be optimal due to an inadequate volume of blood received in culture bottles Performed at [facility] [address] [place], [place] [address]    Culture  Setup Time   Final    GRAM POSITIVE COCCI IN CLUSTERS ANAEROBIC BOTTLE ONLY Organism ID to follow CRITICAL RESULT CALLED TO, READ BACK BY AND VERIFIED WITH: D. Wofford PharmD 12:10 09/14/20 ([email or web address])    Culture (A)  Final    STAPHYLOCOCCUS HOMINIS THE SIGNIFICANCE OF ISOLATING THIS ORGANISM FROM A SINGLE SET OF BLOOD CULTURES WHEN MULTIPLE SETS ARE DRAWN IS UNCERTAIN. PLEASE NOTIFY THE MICROBIOLOGY DEPARTMENT WITHIN ONE WEEK IF SPECIATION AND SENSITIVITIES ARE REQUIRED. Performed at [facility], [place] [address], [place], [place] [address]    Report Status PENDING  Incomplete  Blood Culture ID Panel (Reflexed)     Status: Abnormal   Collection Time: 09/13/20 10:52 AM  Result Value Ref Range Status   Enterococcus faecalis NOT DETECTED NOT DETECTED Final   Enterococcus Faecium NOT DETECTED NOT DETECTED Final   Listeria monocytogenes NOT DETECTED NOT DETECTED Final   Staphylococcus species DETECTED (A) NOT DETECTED Final    Comment: CRITICAL RESULT CALLED TO, READ BACK BY AND VERIFIED WITH: D. Wofford PharmD 12:10 09/14/20 ([email or web address])    Staphylococcus aureus (BCID) NOT DETECTED NOT DETECTED Final   Staphylococcus epidermidis NOT DETECTED NOT DETECTED Final   Staphylococcus lugdunensis NOT DETECTED NOT DETECTED Final   Streptococcus species NOT DETECTED NOT DETECTED Final   Streptococcus  agalactiae NOT DETECTED NOT DETECTED Final   Streptococcus pneumoniae NOT DETECTED NOT DETECTED Final   Streptococcus pyogenes NOT DETECTED NOT DETECTED Final   A.calcoaceticus-baumannii NOT DETECTED NOT DETECTED Final   Bacteroides fragilis NOT DETECTED NOT DETECTED Final   Enterobacterales NOT DETECTED NOT DETECTED Final   Enterobacter cloacae complex NOT DETECTED NOT DETECTED Final   Escherichia coli NOT DETECTED NOT DETECTED Final   Klebsiella aerogenes NOT DETECTED NOT DETECTED Final   Klebsiella oxytoca NOT DETECTED NOT DETECTED Final   Klebsiella pneumoniae NOT DETECTED NOT DETECTED Final   Proteus species NOT DETECTED NOT DETECTED Final   Salmonella species NOT DETECTED NOT DETECTED Final   Serratia marcescens NOT DETECTED NOT DETECTED Final   Haemophilus influenzae NOT DETECTED NOT DETECTED Final   Neisseria meningitidis NOT DETECTED NOT DETECTED Final   Pseudomonas aeruginosa NOT DETECTED NOT DETECTED Final   Stenotrophomonas maltophilia NOT DETECTED NOT DETECTED Final   Candida albicans NOT DETECTED NOT DETECTED Final   Candida auris NOT DETECTED NOT DETECTED Final   Candida glabrata NOT DETECTED NOT DETECTED Final   Candida krusei NOT DETECTED NOT DETECTED Final   Candida parapsilosis NOT DETECTED NOT DETECTED Final   Candida tropicalis NOT DETECTED NOT DETECTED Final   Cryptococcus neoformans/gattii NOT DETECTED NOT DETECTED Final    Comment: Performed at [facility], [address] [place], [place] [address]  Resp Panel by RT PCR (RSV, Flu A&B, Covid) - Nasopharyngeal  Swab     Status: None   Collection Time: 09/13/20 11:37 AM   Specimen: Nasopharyngeal Swab  Result Value Ref Range Status   SARS Coronavirus 2 by RT PCR NEGATIVE NEGATIVE Final    Comment: (NOTE) SARS-CoV-2 target nucleic acids are NOT DETECTED.  The SARS-CoV-2 RNA is generally detectable in upper respiratoy specimens during the acute phase of infection. The lowest concentration of SARS-CoV-2  viral copies this assay can detect is 131 copies/mL. A negative result does not preclude SARS-Cov-2 infection and should not be used as the sole basis for treatment or other patient management decisions. A negative result may occur with  improper specimen collection/handling, submission of specimen other than nasopharyngeal swab, presence of viral mutation(s) within the areas targeted by this assay, and inadequate number of viral copies (<131 copies/mL). A negative result must be combined with clinical observations, patient history, and epidemiological information. The expected result is Negative.  Fact Sheet for Patients:  PinkCheek.be  Fact Sheet for Healthcare Providers:  GravelBags.it  This test is no t yet approved or cleared by the [place] FDA and  has been authorized for detection and/or diagnosis of SARS-CoV-2 by FDA under an Emergency Use Authorization (EUA). This EUA will remain  in effect (meaning this test can be used) for the duration of the COVID-19 declaration under Section 564(b)(1) of the Act, 21 U.S.C. section 360bbb-3(b)(1), unless the authorization is terminated or revoked sooner.     Influenza A by PCR NEGATIVE NEGATIVE Final   Influenza B by PCR NEGATIVE NEGATIVE Final    Comment: (NOTE) The Xpert Xpress SARS-CoV-2/FLU/RSV assay is intended as an aid in  the diagnosis of influenza from Nasopharyngeal swab specimens and  should not be used as a sole basis for treatment. Nasal washings and  aspirates are unacceptable for Xpert Xpress SARS-CoV-2/FLU/RSV  testing.  Fact Sheet for Patients: PinkCheek.be  Fact Sheet for Healthcare Providers: GravelBags.it  This test is not yet approved or cleared by the [place] FDA and  has been authorized for detection and/or diagnosis of SARS-CoV-2 by  FDA under an Emergency Use Authorization (EUA).  This EUA will remain  in effect (meaning this test can be used) for the duration of the  Covid-19 declaration under Section 564(b)(1) of the Act, 21  U.S.C. section 360bbb-3(b)(1), unless the authorization is  terminated or revoked.    Respiratory Syncytial Virus by PCR NEGATIVE NEGATIVE Final    Comment: (NOTE) Fact Sheet for Patients: PinkCheek.be  Fact Sheet for Healthcare Providers: GravelBags.it  This test is not yet approved or cleared by the [place] FDA and  has been authorized for detection and/or diagnosis of SARS-CoV-2 by  FDA under an Emergency Use Authorization (EUA). This EUA will remain  in effect (meaning this test can be used) for the duration of the  COVID-19 declaration under Section 564(b)(1) of the Act, 21 U.S.C.  section 360bbb-3(b)(1), unless the authorization is terminated or  revoked. Performed at [facility], [place] [address], [place], [place] [address]   MRSA PCR Screening     Status: None   Collection Time: 09/13/20  6:05 PM   Specimen: Nasopharyngeal  Result Value Ref Range Status   MRSA by PCR NEGATIVE NEGATIVE Final    Comment:        The GeneXpert MRSA Assay (FDA approved for NASAL specimens only), is one component of a comprehensive MRSA colonization surveillance program. It is not intended to diagnose MRSA infection nor to guide or monitor  treatment for MRSA infections. Performed at [facility], [place] [address], [place], [place] [address]          Radiology Studies: MR BRAIN WO CONTRAST  Result Date: 09/14/2020 CLINICAL DATA:  Mental status change with unknown cause, possible CVA EXAM: MRI HEAD WITHOUT CONTRAST TECHNIQUE: Multiplanar, multiecho pulse sequences of the brain and surrounding structures were obtained without intravenous contrast. COMPARISON:  Head CT from yesterday FINDINGS: Brain: No acute infarction, hemorrhage,  hydrocephalus, extra-axial collection or mass lesion. Brain atrophy most notable in the parietal and temporal areas. Confluent chronic small vessel ischemia to a moderate degree in the periventricular white matter. Vascular: Preserved flow voids for technique. Skull and upper cervical spine: No focal marrow lesion. Right more than left parietal scalp swelling. Sinuses/Orbits: Large retention cyst appearance in the right maxillary sinus. There is also opacification of a right anterior ethmoid air cell. IMPRESSION: 1. Motion degraded study without acute intracranial finding. 2. Brain atrophy and moderate chronic small vessel ischemia. Electronically Signed   By: Monte Fantasia M.D.   On: 09/14/2020 08:40   DG Pelvis Portable  Result Date: 09/13/2020 CLINICAL DATA:  Fall EXAM: PORTABLE PELVIS 1-2 VIEWS COMPARISON:  September 22, 2009 FINDINGS: There is no evidence of pelvic fracture or dislocation slight symmetric narrowing of each hip joint. No erosive change. IMPRESSION: Slight symmetric narrowing of each hip joint. No fracture or dislocation. Electronically Signed   By: Lowella Grip III M.D.   On: 09/13/2020 13:49   EEG adult  Result Date: 09/14/2020 Lora Havens, MD     09/14/2020  3:51 PM Patient Name: Cutter Passey MRN: [id number] Epilepsy Attending: Lora Havens Referring Physician/Provider: Dr. Marva Panda Date: 11/15/201 Duration: 23.16 mins Patient history: 79 year old male with altered mental status. Level of alertness: Awake AEDs during EEG study: None Technical aspects: This EEG study was done with scalp electrodes positioned according to the 10-20 International system of electrode placement. Electrical activity was acquired at a sampling rate of _0  and reviewed with a high frequency filter of _1  and a low frequency filter of _2 . EEG data were recorded continuously and digitally stored. Description: The posterior dominant rhythm consists of 8 Hz activity of moderate voltage  (25-35 uV) seen predominantly in posterior head regions, symmetric and reactive to eye opening and eye closing.  EEG showed intermittent 3 to 5 Hz theta-delta slowing again left more than right temporal region.  Hyperventilation and photic stimulation were not performed.   ABNORMALITY -Intermittent slow, left more than right temporal region IMPRESSION: This study is suggestive of nonspecific cortical dysfunction in left more than right temporal region.  No seizures or epileptiform discharges were seen throughout the recording. Lora Havens   ECHOCARDIOGRAM COMPLETE  Result Date: 09/14/2020    ECHOCARDIOGRAM REPORT   Patient Name:   WILBER FINI Date of Exam: 09/14/2020 Medical Rec #:  [id number]   Height:       74.0 in Accession #:    [id number]  Weight:       183.6 lb Date of Birth:  [birth year]   BSA:          2.096 m Patient Age:    70 years    BP:           141/83 mmHg Patient Gender: M           HR:           79 bpm. Exam Location:  Inpatient Procedure: 2D Echo Indications:  bacteremia  History:        Patient has no prior history of Echocardiogram examinations.                 Risk Factors:Hypertension and Current Smoker. CVA. tobacco                 abuse.  Sonographer:    Jannett Celestine RDCS (AE) Referring Phys: [id number] Samanthamarie Ezzell V Emersynn Deatley  Sonographer Comments: No parasternal window. Restricted mobility and Image acquisition challenging due to respiratory motion. IMPRESSIONS  1. Left ventricular ejection fraction, by estimation, is 55 to 60%. The left ventricle has normal function. Left ventricular endocardial border not optimally defined to evaluate regional wall motion. Left ventricular diastolic parameters are indeterminate.  2. Right ventricular systolic function is normal. The right ventricular size is normal.  3. The mitral valve is normal in structure. Trivial mitral valve regurgitation. No evidence of mitral stenosis.  4. The aortic valve is normal in structure. Aortic valve regurgitation is not  visualized. No aortic stenosis is present.  5. The inferior vena cava is normal in size with greater than 50% respiratory variability, suggesting right atrial pressure of 3 mmHg. Conclusion(s)/Recommendation(s): No evidence of valvular vegetations on this transthoracic echocardiogram. Would recommend a transesophageal echocardiogram to exclude infective endocarditis if clinically indicated. FINDINGS  Left Ventricle: Left ventricular ejection fraction, by estimation, is 55 to 60%. The left ventricle has normal function. Left ventricular endocardial border not optimally defined to evaluate regional wall motion. The left ventricular internal cavity size was normal in size. There is no left ventricular hypertrophy. Left ventricular diastolic parameters are indeterminate. Right Ventricle: The right ventricular size is normal. No increase in right ventricular wall thickness. Right ventricular systolic function is normal. Left Atrium: Left atrial size was normal in size. Right Atrium: Right atrial size was normal in size. Pericardium: There is no evidence of pericardial effusion. Mitral Valve: The mitral valve is normal in structure. Trivial mitral valve regurgitation. No evidence of mitral valve stenosis. Tricuspid Valve: The tricuspid valve is normal in structure. Tricuspid valve regurgitation is mild . No evidence of tricuspid stenosis. Aortic Valve: The aortic valve is normal in structure. Aortic valve regurgitation is not visualized. No aortic stenosis is present. Pulmonic Valve: The pulmonic valve was normal in structure. Pulmonic valve regurgitation is not visualized. No evidence of pulmonic stenosis. Aorta: The aortic root is normal in size and structure. Venous: The inferior vena cava is normal in size with greater than 50% respiratory variability, suggesting right atrial pressure of 3 mmHg. IAS/Shunts: No atrial level shunt detected by color flow Doppler.  AORTIC VALVE LVOT Vmax:   80.30 cm/s LVOT Vmean:  61.400  cm/s LVOT VTI:    0.172 m  SHUNTS Systemic VTI: 0.17 m Candee Furbish MD Electronically signed by Candee Furbish MD Signature Date/Time: 09/14/2020/2:44:25 PM    Final         Scheduled Meds: . aspirin EC  81 mg Oral Daily  . chlorhexidine  15 mL Mouth Rinse BID  . enoxaparin (LOVENOX) injection  40 mg Subcutaneous Q24H  . erythromycin   Left Eye QID  . mouth rinse  15 mL Mouth Rinse q12n4p  . metoprolol succinate  100 mg Oral Daily  . mometasone-formoterol  2 puff Inhalation BID  . sodium chloride flush  3 mL Intravenous Q12H  . umeclidinium bromide  1 puff Inhalation Daily   Continuous Infusions: . sodium chloride 75 mL/hr at 09/15/20 1002  . vancomycin 750 mg (09/15/20  8628)     LOS: 2 days    Time spent: 40 minutes    Irine Seal, MD [facility]   To contact the attending provider between 7A-7P or the covering provider during after hours 7P-7A, please log into the web site [email or web address] and access using universal  password for that web site. If you do not have the password, please call the hospital operator.  09/15/2020, 1:15 PM
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
## 2020-09-15 NOTE — NC FL2 (Signed)
[place] LEVEL OF CARE SCREENING TOOL     IDENTIFICATION  Patient Name: Tommy Bauer Birthdate: Jul 14, 1941 Sex: male Admission Date (Current Location): 09/13/2020  [facility] and [place] Number:  Herbalist and Address:  [facility],  [place], [place]      Provider Number: [id number]  Attending Physician Name and Address:  Eugenie Filler, MD  Relative Name and Phone Number:       Current Level of Care: Hospital Recommended Level of Care: [place] Prior Approval Number:    Date Approved/Denied:   PASRR Number: [id number]  Discharge Plan: SNF    Current Diagnoses: Patient Active Problem List   Diagnosis Date Noted  . Bacteremia due to Gram-positive bacteria 09/14/2020  . Sepsis ([place]) 09/13/2020  . AMS (altered mental status) 09/13/2020  . AKI (acute kidney injury) ([place]) 09/13/2020  . Conjunctivitis 09/13/2020  . Rhabdomyolysis 09/13/2020  . Central retinal artery occlusion 07/15/2015  . 79 year-old gentleman s/p radical prostatectomy for pT3a adenocarcinoma with a Gleason's Score of 4+3, with extracapsular extension noted on pathology, and rising PSA of 0.36 05/19/2010  . TOBACCO ABUSE 11/06/2009  . CARPAL TUNNEL SYNDROME 10/15/2008  . BACK PAIN 07/16/2008  . INGUINAL HERNIA 12/04/2007  . Cardiovascular disease 11/12/2007  . ERECTILE DYSFUNCTION, ORGANIC 07/11/2007  . HEADACHE 07/11/2007  . Essential hypertension 06/08/2007  . History of colonic polyps 06/08/2007    Orientation RESPIRATION BLADDER Height & Weight     Self, Place  Normal Incontinent Weight: 83.3 kg Height:  6\' 2"  (188 cm)  BEHAVIORAL SYMPTOMS/MOOD NEUROLOGICAL BOWEL NUTRITION STATUS      Continent Diet  AMBULATORY STATUS COMMUNICATION OF NEEDS Skin   Extensive Assist Verbally PU Stage and Appropriate Care, Other (Comment) (moisture associated skin damage to perineum, has a maroon area of discokoration to lateral upper  thigh)                       Personal Care Assistance Level of Assistance  Bathing, Dressing, Total care Bathing Assistance: Maximum assistance   Dressing Assistance: Maximum assistance Total Care Assistance: Maximum assistance   Functional Limitations Info             SPECIAL CARE FACTORS FREQUENCY  PT (By licensed PT), OT (By licensed OT)     PT Frequency: 5x weekly OT Frequency: 5x weekly            Contractures Contractures Info: Not present    Additional Factors Info  Code Status, Allergies Code Status Info: Full Allergies Info: NKDA           Current Medications (09/15/2020):  This is the current hospital active medication list Current Facility-Administered Medications  Medication Dose Route Frequency Provider Last Rate Last Admin  . 0.9 %  sodium chloride infusion   Intravenous Continuous Eugenie Filler, MD 75 mL/hr at 09/15/20 1002 Rate Change at 09/15/20 1002  . acetaminophen (TYLENOL) tablet 650 mg  650 mg Oral Q6H PRN Harold Hedge, MD       Or  . acetaminophen (TYLENOL) suppository 650 mg  650 mg Rectal Q6H PRN Harold Hedge, MD      . aspirin EC tablet 81 mg  81 mg Oral Daily Harold Hedge, MD   81 mg at 09/15/20 0842  . chlorhexidine (PERIDEX) 0.12 % solution 15 mL  15 mL Mouth Rinse BID Harold Hedge, MD   15 mL at 09/15/20 0842  .  enoxaparin (LOVENOX) injection 40 mg  40 mg Subcutaneous Q24H Harold Hedge, MD   40 mg at 09/14/20 2116  . erythromycin ophthalmic ointment   Left Eye QID Harold Hedge, MD   Given at 09/15/20 [id number]  . guaiFENesin-dextromethorphan (ROBITUSSIN DM) 100-10 MG/5ML syrup 5 mL  5 mL Oral Q4H PRN Harold Hedge, MD      . ipratropium-albuterol (DUONEB) 0.5-2.5 (3) MG/3ML nebulizer solution 3 mL  3 mL Nebulization Q2H PRN Eugenie Filler, MD      . MEDLINE mouth rinse  15 mL Mouth Rinse q12n4p Harold Hedge, MD   15 mL at 09/14/20 1502  . metoprolol succinate (TOPROL-XL) 24 hr tablet 100 mg  100 mg Oral Daily  Harold Hedge, MD   100 mg at 09/15/20 0842  . mometasone-formoterol (DULERA) 100-5 MCG/ACT inhaler 2 puff  2 puff Inhalation BID Eugenie Filler, MD      . polyethylene glycol (MIRALAX / GLYCOLAX) packet 17 g  17 g Oral Daily PRN Harold Hedge, MD      . sodium chloride flush (NS) 0.9 % injection 3 mL  3 mL Intravenous Q12H Harold Hedge, MD   3 mL at 09/13/20 2250  . umeclidinium bromide (INCRUSE ELLIPTA) 62.5 MCG/INH 1 puff  1 puff Inhalation Daily Eugenie Filler, MD      . vancomycin (VANCOREADY) IVPB 750 mg/150 mL  750 mg Intravenous Q12H Harold Hedge, MD 150 mL/hr at 09/15/20 0521 750 mg at 09/15/20 1694     Discharge Medications: Please see discharge summary for a list of discharge medications.  Relevant Imaging Results:  Relevant Lab Results:   Additional Information SSN [id number]  Joaquin Courts, RN

## 2020-09-15 NOTE — TOC Progression Note (Signed)
Transition of Care [facility]) - Progression Note    Patient Details  Name: Deloy Archey MRN: [id number] Date of Birth: Sep 15, 1941  [facility] CM/SW Contact  Joaquin Courts, RN Phone Number: 09/15/2020, 10:51 AM  Clinical Narrative:    CM spoke with patient regarding SNF recommendation.  Patient is agreeable to pursue SNF placement.  FL2 faxed out to area facilities.    Expected Discharge Plan: [place] Barriers to Discharge: Continued Medical Work up  Expected Discharge Plan and Services Expected Discharge Plan: [place]   Discharge Planning Services: CM Consult   Living arrangements for the past 2 months: Single Family Home                                       Social Determinants of Health (SDOH) Interventions    Readmission Risk Interventions No flowsheet data found.

## 2020-09-15 NOTE — Evaluation (Signed)
Occupational Therapy Evaluation Patient Details Name: Tommy Bauer MRN: [id number] DOB: May 27, 1941 Today's Date: 09/15/2020    History of Present Illness Pt is 79 yo male with PMH of prostate CA, CVA, HTN, ETOH abuse, hyperlipidemia, and central retinal artery occlusion.  Pt was found on the floor at home by ex-wife.  Pt admitted with sepsis, L eye conjunctivitis, and mild rhabdomyolysis, and metabolic encephalopathy.   Clinical Impression   Patient is currently requiring assistance with ADLs including total assistance with toileting and moderate to max assist with UE/LE dressing and bathing as well as minimal assist with bed level grooming and eating all of which is below patient's typical baseline at home, however per chart review pt has had progressive difficulties with his self and home care.  During this evaluation, patient was limited by confusion, generalized weakness and decreased activity tolerance, which has the potential to impact patient's safety and independence during functional mobility, as well as performance for ADLs. [place] "6-clicks" Daily Activity Inpatient Short Form score of 13/24 indicates 63.03% ADL impairment this session. Patient lives alone, and his ex-wife has presumably been assisting as able, however, it appears from chart review that pt requires more assitance to remain safe at home.  Patient demonstrates fair rehab potential, and should benefit from continued skilled occupational therapy services while in acute care to maximize safety, independence and quality of life at home.  Continued occupational therapy services in a SNF setting prior to return home is recommended.  ?   Follow Up Recommendations  Supervision/Assistance - 24 hour;SNF    Equipment Recommendations  3 in 1 bedside commode (RW)    Recommendations for Other Services       Precautions / Restrictions Precautions Precautions: Fall Restrictions Weight Bearing Restrictions: No       Mobility Bed Mobility Overal bed mobility: Needs Assistance Bed Mobility: Supine to Sit;Sit to Supine     Supine to sit: Mod assist;HOB elevated Sit to supine: Mod assist;HOB elevated   General bed mobility comments: increased time and cues/assist for initiation, heavy relaince on bed rails. Cues for positioning as pt initially attempted to lie straight back rather than towards HOB.    Transfers Overall transfer level: Needs assistance Equipment used: Rolling [place] (2 wheeled) Transfers: Sit to/from Stand Sit to Stand: Min assist;+2 safety/equipment;From elevated surface         General transfer comment: assist to initiate and steady, cues for hand and foot placement. Pt unable to follow cues for hand placement with stand to sit and poor eccentric control noted with Moderate assist to descent to EOB.    Balance Overall balance assessment: Needs assistance Sitting-balance support: No upper extremity supported;Feet supported Sitting balance-Leahy Scale: Good     Standing balance support: Bilateral upper extremity supported Standing balance-Leahy Scale: Poor Standing balance comment: Requiring UE support with tendency to lean backward with legs against bed to assist with balance             High level balance activites: Side stepping             ADL either performed or assessed with clinical judgement   ADL Overall ADL's : Needs assistance/impaired Eating/Feeding: Supervision/ safety;Set up;Cueing for safety;Cueing for sequencing;Bed level Eating/Feeding Details (indicate cue type and reason): Cues for swallowing, difficulty with holding utensils at times. Grooming: Bed level;Min guard;Wash/dry hands;Cueing for sequencing   Upper Body Bathing: Sitting;Minimal assistance;Cueing for sequencing;Cueing for safety;Set up   Lower Body Bathing: Sitting/lateral leans;Sit to/from stand;Maximal assistance;Cueing for  sequencing;Cueing for safety   Upper Body  Dressing : Moderate assistance;Cueing for sequencing;Cueing for safety;Sitting   Lower Body Dressing: Maximal assistance;Sitting/lateral leans;Sit to/from stand;Cueing for safety;Cueing for sequencing   Toilet Transfer: Cueing for sequencing;Minimal assistance;+2 for physical assistance Toilet Transfer Details (indicate cue type and reason): Unable to pivot to chair or BSC today as pt refused to remain OOB. Pt took 3-4 lateral steps to LT with RW and Min As of 2 people. Verbal cues for sequencing. Toileting- Clothing Manipulation and Hygiene: Total assistance;Bed level       Functional mobility during ADLs: Moderate assistance;Minimal assistance;+2 for physical assistance General ADL Comments: Please see mobility section.     Vision   Additional Comments: LT eye conjunctivitis per chart with redness and discharge noted. Not formally tested this visit.     Perception     Praxis      Pertinent Vitals/Pain Pain Assessment: No/denies pain     Hand Dominance Right (Pt describes being ambidextrous)   Extremity/Trunk Assessment Upper Extremity Assessment Upper Extremity Assessment: Overall WFL for tasks assessed;RUE deficits/detail RUE Deficits / Details: RT shoulder with decreased ROM to ~110 and MMT of ~4-/5.   Lower Extremity Assessment Lower Extremity Assessment: Defer to PT evaluation   Cervical / Trunk Assessment Cervical / Trunk Assessment: Normal   Communication Communication Communication: Other (comment) (dysarthric)   Cognition Arousal/Alertness: Awake/alert Behavior During Therapy: WFL for tasks assessed/performed;Flat affect Overall Cognitive Status: No family/caregiver present to determine baseline cognitive functioning Area of Impairment: Orientation;Problem solving;Attention;Memory;Following commands;Safety/judgement;Awareness                 Orientation Level: Disoriented to;Place;Time;Situation (Unable to state month or year despipte cues. Knows he is  in [place].) Current Attention Level: Focused Memory: Decreased short-term memory Following Commands: Follows one step commands inconsistently Safety/Judgement: Decreased awareness of safety;Decreased awareness of deficits Awareness: Intellectual Problem Solving: Slow processing;Difficulty sequencing;Requires verbal cues;Requires tactile cues;Decreased initiation     General Comments       Exercises     Shoulder Instructions      Home Living Family/patient expects to be discharged to:: Skilled nursing facility                                 Additional Comments: Per chart , case manager spoke with ex wife who reports pt lives alone, unable to care for self, frequent falls, and home is disheveled.  Pt remains too confused to provide reliable history.      Prior Functioning/Environment Level of Independence: Needs assistance  Gait / Transfers Assistance Needed: Frequent falls. ADL's / Homemaking Assistance Needed: Pt reports that his ex-wife performs all IADLs including housekeeping, cooking, grocery shopping and laundry. Ex-spouse not in room to confirm. Communication / Swallowing Assistance Needed: Pt noted to pocket food in mouth, very distracted by TV.  Pt repositioned at end of session with HOB elevated, TV off and cues for small bites and sips to assist with swallow. Comments: Pt confused and unable to provide PLOF.  Per chart pt struggling to care for self at home        OT Problem List: Decreased strength;Decreased coordination;Impaired sensation;Decreased cognition;Decreased activity tolerance;Decreased safety awareness;Impaired balance (sitting and/or standing);Decreased knowledge of use of DME or AE;Decreased knowledge of precautions;Impaired vision/perception      OT Treatment/Interventions: Self-care/ADL training;Therapeutic exercise;Therapeutic activities;Cognitive remediation/compensation;Energy conservation;Visual/perceptual  remediation/compensation;DME and/or AE instruction;Patient/family education;Balance training    OT Goals(Current goals can be found  in the care plan section) Acute Rehab OT Goals Patient Stated Goal: Pt denied having a goal related to therapy, but agreeable to SNF. ADL Goals Pt Will Perform Lower Body Bathing: sitting/lateral leans;with supervision;with adaptive equipment Pt Will Perform Upper Body Dressing: with set-up;with supervision;sitting Pt Will Perform Lower Body Dressing: with supervision;with adaptive equipment;with set-up;sitting/lateral leans;sit to/from stand Pt Will Transfer to Toilet: with supervision;ambulating Pt Will Perform Toileting - Clothing Manipulation and hygiene: with supervision;sitting/lateral leans;sit to/from stand Additional ADL Goal #1: Pt will demonstrate improved mentation general awareness by scoring <10 on Short Blessed Test. Additional ADL Goal #2: Patient will tolerate BUE home exercise program 10-15 reps within pain-free ranges, in an unsupported seated position, in order to improve upper body strength, endurance and core stability needed to complete home ADLs.  OT Frequency: Min 2X/week   Barriers to D/C: Decreased caregiver support          Co-evaluation              AM-PAC OT "6 Clicks" Daily Activity     Outcome Measure Help from another person eating meals?: A Little Help from another person taking care of personal grooming?: A Little Help from another person toileting, which includes using toliet, bedpan, or urinal?: Total Help from another person bathing (including washing, rinsing, drying)?: A Lot Help from another person to put on and taking off regular upper body clothing?: A Lot Help from another person to put on and taking off regular lower body clothing?: A Lot 6 Click Score: 13   End of Session Equipment Utilized During Treatment: Gait belt;Rolling [place] Nurse Communication: Mobility status  Activity Tolerance: Patient  tolerated treatment well Patient left: in bed;with call bell/phone within reach;with bed alarm set  OT Visit Diagnosis: Unsteadiness on feet (R26.81);Repeated falls (R29.6);Muscle weakness (generalized) (M62.81);History of falling (Z91.81);Other symptoms and signs involving cognitive function                Time: 2800-3491 OT Time Calculation (min): 30 min Charges:  OT General Charges $OT Visit: 1 Visit OT Evaluation $OT Eval Low Complexity: 1 Low OT Treatments $Therapeutic Activity: 8-22 mins  Anderson Malta, OT Acute Rehab Services Office: [phone] 09/15/2020  Tommy Bauer 09/15/2020, 12:53 PM

## 2020-09-16 ENCOUNTER — Encounter (HOSPITAL_COMMUNITY): Payer: Self-pay | Admitting: Internal Medicine

## 2020-09-16 DIAGNOSIS — R7881 Bacteremia: Secondary | ICD-10-CM | POA: Diagnosis not present

## 2020-09-16 LAB — BASIC METABOLIC PANEL
Anion gap: 7 (ref 5–15)
BUN: 26 mg/dL — ABNORMAL HIGH (ref 8–23)
CO2: 25 mmol/L (ref 22–32)
Calcium: 8 mg/dL — ABNORMAL LOW (ref 8.9–10.3)
Chloride: 107 mmol/L (ref 98–111)
Creatinine, Ser: 1.07 mg/dL (ref 0.61–1.24)
GFR, Estimated: >60 mL/min (ref >60)
Glucose, Bld: 103 mg/dL — ABNORMAL HIGH (ref 70–99)
Potassium: 3.9 mmol/L (ref 3.5–5.1)
Sodium: 139 mmol/L (ref 135–145)

## 2020-09-16 LAB — CULTURE, BLOOD (ROUTINE X 2)

## 2020-09-16 LAB — CBC
HCT: 50.5 % (ref 39.0–52.0)
Hemoglobin: 15.7 g/dL (ref 13.0–17.0)
MCH: 29.2 pg (ref 26.0–34.0)
MCHC: 31.1 g/dL (ref 30.0–36.0)
MCV: 93.9 fL (ref 80.0–100.0)
Platelets: 109 10*3/uL — ABNORMAL LOW (ref 150–400)
RBC: 5.38 MIL/uL (ref 4.22–5.81)
RDW: 13.9 % (ref 11.5–15.5)
WBC: 19.9 10*3/uL — ABNORMAL HIGH (ref 4.0–10.5)
nRBC: 0.2 % (ref 0.0–0.2)

## 2020-09-16 LAB — CK: Total CK: 400 U/L — ABNORMAL HIGH (ref 49–397)

## 2020-09-16 MED ORDER — THIAMINE HCL 100 MG PO TABS
100.0000 mg | ORAL_TABLET | Freq: Every day | ORAL | Status: DC
  Administered 2020-09-16 – 2020-09-17 (×2): 100 mg via ORAL
  Filled 2020-09-16 (×2): qty 1

## 2020-09-16 MED ORDER — FOLIC ACID 1 MG PO TABS
1.0000 mg | ORAL_TABLET | Freq: Every day | ORAL | Status: DC
  Administered 2020-09-16 – 2020-09-17 (×2): 1 mg via ORAL
  Filled 2020-09-16 (×2): qty 1

## 2020-09-16 MED ORDER — ADULT MULTIVITAMIN W/MINERALS CH
1.0000 | ORAL_TABLET | Freq: Every day | ORAL | Status: DC
  Administered 2020-09-16 – 2020-09-17 (×2): 1 via ORAL
  Filled 2020-09-16 (×2): qty 1
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
## 2020-09-16 NOTE — Progress Notes (Signed)
PROGRESS NOTE    Tommy Bauer  LYY:[id number] DOB: Oct 25, 1941 DOA: 09/13/2020 PCP: [name], NP   Chief Complaint  Patient presents with   Altered Mental Status    Brief Narrative:  HPI per Dr. [name] This is a47 y.o.malewith Tommy Bauer past medical history of prostate cancer in 2011, CVA, hypertension, alcohol use, hyperlipidemia, central retinal artery occlusion who presented to the ED via EMS today after his ex-wife found him on the floor. Per the patient's wife who checks in on him occasionally, she knocked on his door 3 days ago but he did not answer and she called 911 for Tommy Bauer wellness check. He did eventually walk to the door and reported that he was fine and did not want to go to the hospital. She went to check on him again today but he did not answer the door. His front door was open but the screen door was locked and she noted the house to be very cold. She called for assistance again and was able to get in the house and he was found laying in his bedroom on his right side prompting the patient to be brought to the ED. She states that he is Tommy Bauer. The patient does not recall these events and is unable to recall what happened over the past couple days. He denies any complaints at this time.  ED Course:Hypothermicwith Bair Hugger, tachycardic, tachypneic, hypertensive. Notable Labs:Glucose 160, BUN 53, creatinine 1.41, albumin 3.1, AST 61, ALT 39, T bili 1.3, CK 1836, troponin43->45, WBC 18.8, Hb 19.9, platelets 176, lactic acid 5.2, UA overall unremarkable. Notable Imaging:CXR, CT head, CT cervical spine, pelvis x-ray overall unremarkable for acute findings.. Patient receivedIV fluids per sepsis protocol, vancomycin, metronidazole and cefepime.  Assessment & Plan:   Principal Problem:   Sepsis ([place]) Active Problems:   Essential hypertension   Central retinal artery occlusion   AMS (altered mental status)   AKI (acute kidney injury) ([place])   Conjunctivitis    Rhabdomyolysis   Bacteremia due to Gram-positive bacteria  # Concern for Sepsis: Sepsis ruled out with no source of infection Presented with hypothermia, tachycardia, tachypnea, leukocytosis, and acidosis Cx growing gram positive cocci, gram positive rods, and staph hominis ID was c/s, suspected contaminant - follow repeat blood cx Urine cx with <10,000 CFU Continue to follow off antibiotics Echo without evidence of valvular vegetations  2.  Left eye conjunctivitis Likely bacterial.  Patient with history of central retinal artery occlusion.  Continue erythromycin ointment and eye care.  Outpatient follow-up.   3.  Mild rhabdomyolysis Improved with hydration.  Follow.    4.  Acute metabolic encephalopathy Likely secondary to problem #1 and dehydration.  Head CT unremarkable.  On admission there was concern that patient may have bitten his tongue and as such seizures Pama Roskos part of the differential.   MRI brain with no acute abnormalities.  P EEG suggestive of nonspecific cortical dysfunction L > R in the temporal region.  No seizures or epileptiform discharges seen throughout the recording.   Findings discussed with neurology by previous MD who reviewed imaging with me and felt patient likely did not have Tommy Bauer seizure and at this time does not need to be placed on any antiepileptic medications.   TSH within normal limits.   # Thrombocytopenia: worsening, continue to monitor  5.  Acute kidney injury Secondary to prerenal azotemia in the setting of ACE inhibitor.  Renal function improved with hydration.  Continue to hold ACE inhibitor.  Follow.  6.  Hypertension Currently controlled.  Continue Toprol-XL.  Continue to hold ACE inhibitor as patient had presented with dehydration and acute kidney injury.  Follow.    7.  History of CVA Continue aspirin, statin.  Risk factor modification.  8.  History of alcohol use Unknown last drink.  Alcohol level negative.  No signs of withdrawal.   Monitor closely.  Follow. Thiamine, folate, multivitamin  10.  Tobacco abuse Patient with ongoing tobacco use.  States he understands the need for tobacco cessation.  Placed on nicotine patch.  Placed on Incruse, Dulera.  Duo nebs as needed.  DVT prophylaxis: lovenox Code Status: full  Family Communication: ex wife at bedside Disposition:   Status is: Inpatient  Remains inpatient appropriate because:Inpatient level of care appropriate due to severity of illness   Dispo: The patient is from: Home              Anticipated d/c is to: SNF              Anticipated d/c date is: > 3 days              Patient currently is not medically stable to d/c.  Consultants:   ID  Procedures:  Echo IMPRESSIONS    1. Left ventricular ejection fraction, by estimation, is 55 to 60%. The  left ventricle has normal function. Left ventricular endocardial border  not optimally defined to evaluate regional wall motion. Left ventricular  diastolic parameters are  indeterminate.  2. Right ventricular systolic function is normal. The right ventricular  size is normal.  3. The mitral valve is normal in structure. Trivial mitral valve  regurgitation. No evidence of mitral stenosis.  4. The aortic valve is normal in structure. Aortic valve regurgitation is  not visualized. No aortic stenosis is present.  5. The inferior vena cava is normal in size with greater than 50%  respiratory variability, suggesting right atrial pressure of 3 mmHg.   Conclusion(s)/Recommendation(s): No evidence of valvular vegetations on  this transthoracic echocardiogram. Would recommend Sherrel Shafer transesophageal  echocardiogram to exclude infective endocarditis if clinically indicated.   Antimicrobials: Anti-infectives (From admission, onward)   Start     Dose/Rate Route Frequency Ordered Stop   09/14/20 0900  ceFEPIme (MAXIPIME) 2 g in sodium chloride 0.9 % 100 mL IVPB  Status:  Discontinued        2 g 200 mL/hr over 30  Minutes Intravenous Every 8 hours 09/14/20 0809 09/14/20 1457   09/14/20 0600  vancomycin (VANCOREADY) IVPB 750 mg/150 mL  Status:  Discontinued        750 mg 150 mL/hr over 60 Minutes Intravenous Every 12 hours 09/13/20 1751 09/15/20 1439   09/13/20 2200  ceFEPIme (MAXIPIME) 2 g in sodium chloride 0.9 % 100 mL IVPB  Status:  Discontinued        2 g 200 mL/hr over 30 Minutes Intravenous Every 12 hours 09/13/20 1751 09/14/20 0809   09/13/20 1815  ceFEPIme (MAXIPIME) 2 g in sodium chloride 0.9 % 100 mL IVPB  Status:  Discontinued        2 g 200 mL/hr over 30 Minutes Intravenous  Once 09/13/20 1803 09/13/20 1813   09/13/20 1815  vancomycin (VANCOCIN) IVPB 1000 mg/200 mL premix  Status:  Discontinued        1,000 mg 200 mL/hr over 60 Minutes Intravenous  Once 09/13/20 1803 09/13/20 1813   09/13/20 1600  vancomycin (VANCOCIN) IVPB 1000 mg/200 mL premix  1,000 mg 200 mL/hr over 60 Minutes Intravenous  Once 09/13/20 1600 09/13/20 2046   09/13/20 1100  ceFEPIme (MAXIPIME) 2 g in sodium chloride 0.9 % 100 mL IVPB        2 g 200 mL/hr over 30 Minutes Intravenous  Once 09/13/20 1052 09/13/20 1214   09/13/20 1100  metroNIDAZOLE (FLAGYL) IVPB 500 mg        500 mg 100 mL/hr over 60 Minutes Intravenous  Once 09/13/20 1052 09/13/20 1344   09/13/20 1100  vancomycin (VANCOCIN) IVPB 1000 mg/200 mL premix        1,000 mg 200 mL/hr over 60 Minutes Intravenous  Once 09/13/20 1052 09/13/20 1643         Subjective: No new complaints  Objective: Vitals:   09/16/20 0500 09/16/20 0601 09/16/20 0835 09/16/20 1304  BP:  (!) 141/74  127/81  Pulse:  80  94  Resp:  (!) 22  16  Temp:  98.5 F (36.9 C)  99.1 F (37.3 C)  TempSrc:  Oral  Oral  SpO2:  95% 96% 97%  Weight: 91 kg     Height:        Intake/Output Summary (Last 24 hours) at 09/16/2020 1903 Last data filed at 09/16/2020 1758 Gross per 24 hour  Intake 360 ml  Output 1000 ml  Net -640 ml   Filed Weights   09/13/20 2110 09/14/20  0500 09/16/20 0500  Weight: 83.4 kg 83.3 kg 91 kg    Examination:  General exam: Appears calm and comfortable  Respiratory system: Clear to auscultation. Respiratory effort normal. Cardiovascular system: S1 & S2 heard, RRR.  Gastrointestinal system: Abdomen is nondistended, soft and nontender. Central nervous system: Alert and oriented. No focal neurological deficits. Extremities:no LEE Skin: No rashes, lesions or ulcers Psychiatry: Judgement and insight appear normal. Mood & affect appropriate.     Data Reviewed: I have personally reviewed following labs and imaging studies  CBC: Recent Labs  Lab 09/13/20 1052 09/14/20 0413 09/15/20 0425 09/16/20 0511  WBC 18.8* 15.4* 18.7* 19.9*  NEUTROABS 15.6*  --   --   --   HGB 19.9* 16.5 15.5 15.7  HCT 62.1* 52.6* 49.8 50.5  MCV 92.3 92.9 95.8 93.9  PLT 176 158 131* 109*    Basic Metabolic Panel: Recent Labs  Lab 09/13/20 1201 09/13/20 1205 09/14/20 0413 09/15/20 0425 09/16/20 0511  NA 144  --  144 141 139  K 4.2  --  3.9 5.1 3.9  CL 101  --  107 107 107  CO2 29  --  25 24 25   GLUCOSE 160*  --  119* 101* 103*  BUN 53*  --  49* 36* 26*  CREATININE 1.41*  --  1.14 1.12 1.07  CALCIUM 9.1  --  8.6* 8.2* 8.0*  MG  --  3.0*  --   --   --     GFR: Estimated Creatinine Clearance: 65.1 mL/min (by C-G formula based on SCr of 1.07 mg/dL).  Liver Function Tests: Recent Labs  Lab 09/13/20 1201  AST 61*  ALT 39  ALKPHOS 43  BILITOT 1.3*  PROT 7.7  ALBUMIN 3.1*    CBG: Recent Labs  Lab 09/13/20 1113  GLUCAP 99     Recent Results (from the past 240 hour(s))  Urine culture     Status: Abnormal   Collection Time: 09/13/20 10:52 AM   Specimen: In/Out Cath Urine  Result Value Ref Range Status   Specimen Description   Final  IN/OUT CATH URINE Performed at [facility], [place] [address], [place], [place] [address]    Special Requests   Final    URINE, RANDOM Performed at [facility] [address] [place], [place] [address]    Culture (Lawrance Wiedemann)  Final    <10,000 COLONIES/mL INSIGNIFICANT GROWTH Performed at [facility] [address], [place], [place] [address]    Report Status 09/14/2020 FINAL  Final  Blood Culture (routine x 2)     Status: None (Preliminary result)   Collection Time: 09/13/20 10:52 AM   Specimen: BLOOD  Result Value Ref Range Status   Specimen Description   Final    BLOOD LEFT HAND Performed at [facility] [address] [place], [place] [address]    Special Requests   Final    BOTTLES DRAWN AEROBIC AND ANAEROBIC Blood Culture results may not be optimal due to an inadequate volume of blood received in culture bottles Performed at [facility] [address], [place], [place] [address]    Culture  Setup Time   Final    GRAM POSITIVE COCCI ANAEROBIC BOTTLE ONLY CRITICAL VALUE NOTED.  VALUE IS CONSISTENT WITH PREVIOUSLY REPORTED AND CALLED VALUE. AEROBIC BOTTLE ONLY GRAM POSITIVE COCCI GRAM POSITIVE RODS CRITICAL RESULT CALLED TO, READ BACK BY AND VERIFIED WITH: E [facility] 09/16/20 0409 JDW    Culture   Final    CULTURE REINCUBATED FOR BETTER GROWTH Performed at [facility], [place] [address], [place], [place] [address]    Report Status PENDING  Incomplete  Blood Culture (routine x 2)     Status: Abnormal   Collection Time: 09/13/20 10:52 AM   Specimen: BLOOD  Result Value Ref Range Status   Specimen Description   Final    BLOOD LEFT ANTECUBITAL Performed at [facility] [address], [place], [place] [address]    Special Requests   Final    BOTTLES DRAWN AEROBIC AND ANAEROBIC Blood Culture results may not be optimal due to an inadequate volume of blood received in culture bottles Performed at [facility] [address], [place], [place] [address]    Culture  Setup Time   Final    GRAM POSITIVE COCCI IN CLUSTERS ANAEROBIC BOTTLE  ONLY Organism ID to follow CRITICAL RESULT CALLED TO, READ BACK BY AND VERIFIED WITH: D. Wofford PharmD 12:10 09/14/20 ([email or web address])    Culture (Mayrani Khamis)  Final    STAPHYLOCOCCUS HOMINIS THE SIGNIFICANCE OF ISOLATING THIS ORGANISM FROM Alycea Segoviano SINGLE SET OF BLOOD CULTURES WHEN MULTIPLE SETS ARE DRAWN IS UNCERTAIN. PLEASE NOTIFY THE MICROBIOLOGY DEPARTMENT WITHIN ONE WEEK IF SPECIATION AND SENSITIVITIES ARE REQUIRED. Performed at [facility], [place] [address], [place], [place] [address]    Report Status 09/16/2020 FINAL  Final  Blood Culture ID Panel (Reflexed)     Status: Abnormal   Collection Time: 09/13/20 10:52 AM  Result Value Ref Range Status   Enterococcus faecalis NOT DETECTED NOT DETECTED Final   Enterococcus Faecium NOT DETECTED NOT DETECTED Final   Listeria monocytogenes NOT DETECTED NOT DETECTED Final   Staphylococcus species DETECTED (Chayce Rullo) NOT DETECTED Final    Comment: CRITICAL RESULT CALLED TO, READ BACK BY AND VERIFIED WITH: D. Wofford PharmD 12:10 09/14/20 ([email or web address])    Staphylococcus aureus (BCID) NOT DETECTED NOT DETECTED Final   Staphylococcus epidermidis NOT DETECTED NOT DETECTED Final   Staphylococcus lugdunensis NOT DETECTED NOT DETECTED Final  Streptococcus species NOT DETECTED NOT DETECTED Final   Streptococcus agalactiae NOT DETECTED NOT DETECTED Final   Streptococcus pneumoniae NOT DETECTED NOT DETECTED Final   Streptococcus pyogenes NOT DETECTED NOT DETECTED Final   Lucette Kratz.calcoaceticus-baumannii NOT DETECTED NOT DETECTED Final   Bacteroides fragilis NOT DETECTED NOT DETECTED Final   Enterobacterales NOT DETECTED NOT DETECTED Final   Enterobacter cloacae complex NOT DETECTED NOT DETECTED Final   Escherichia coli NOT DETECTED NOT DETECTED Final   Klebsiella aerogenes NOT DETECTED NOT DETECTED Final   Klebsiella oxytoca NOT DETECTED NOT DETECTED Final   Klebsiella pneumoniae NOT DETECTED NOT DETECTED Final   Proteus species NOT DETECTED NOT DETECTED Final   Salmonella  species NOT DETECTED NOT DETECTED Final   Serratia marcescens NOT DETECTED NOT DETECTED Final   Haemophilus influenzae NOT DETECTED NOT DETECTED Final   Neisseria meningitidis NOT DETECTED NOT DETECTED Final   Pseudomonas aeruginosa NOT DETECTED NOT DETECTED Final   Stenotrophomonas maltophilia NOT DETECTED NOT DETECTED Final   Candida albicans NOT DETECTED NOT DETECTED Final   Candida auris NOT DETECTED NOT DETECTED Final   Candida glabrata NOT DETECTED NOT DETECTED Final   Candida krusei NOT DETECTED NOT DETECTED Final   Candida parapsilosis NOT DETECTED NOT DETECTED Final   Candida tropicalis NOT DETECTED NOT DETECTED Final   Cryptococcus neoformans/gattii NOT DETECTED NOT DETECTED Final    Comment: Performed at [facility], [address], [place], [place] [address]  Resp Panel by RT PCR (RSV, Flu Edilson Vital&B, Covid) - Nasopharyngeal Swab     Status: None   Collection Time: 09/13/20 11:37 AM   Specimen: Nasopharyngeal Swab  Result Value Ref Range Status   SARS Coronavirus 2 by RT PCR NEGATIVE NEGATIVE Final    Comment: (NOTE) SARS-CoV-2 target nucleic acids are NOT DETECTED.  The SARS-CoV-2 RNA is generally detectable in upper respiratoy specimens during the acute phase of infection. The lowest concentration of SARS-CoV-2 viral copies this assay can detect is 131 copies/mL. Storm Dulski negative result does not preclude SARS-Cov-2 infection and should not be used as the sole basis for treatment or other patient management decisions. Brelee Renk negative result may occur with  improper specimen collection/handling, submission of specimen other than nasopharyngeal swab, presence of viral mutation(s) within the areas targeted by this assay, and inadequate number of viral copies (<131 copies/mL). Gunhild Bautch negative result must be combined with clinical observations, patient history, and epidemiological information. The expected result is Negative.  Fact Sheet for Patients:    PinkCheek.be  Fact Sheet for Healthcare Providers:  GravelBags.it  This test is no t yet approved or cleared by the [place] FDA and  has been authorized for detection and/or diagnosis of SARS-CoV-2 by FDA under an Emergency Use Authorization (EUA). This EUA will remain  in effect (meaning this test can be used) for the duration of the COVID-19 declaration under Section 564(b)(1) of the Act, 21 U.S.C. section 360bbb-3(b)(1), unless the authorization is terminated or revoked sooner.     Influenza Sonu Kruckenberg by PCR NEGATIVE NEGATIVE Final   Influenza B by PCR NEGATIVE NEGATIVE Final    Comment: (NOTE) The Xpert Xpress SARS-CoV-2/FLU/RSV assay is intended as an aid in  the diagnosis of influenza from Nasopharyngeal swab specimens and  should not be used as Mackynzie Woolford sole basis for treatment. Nasal washings and  aspirates are unacceptable for Xpert Xpress SARS-CoV-2/FLU/RSV  testing.  Fact Sheet for Patients: PinkCheek.be  Fact Sheet for Healthcare Providers: GravelBags.it  This test is not yet approved or cleared  by the [place] and  has been authorized for detection and/or diagnosis of SARS-CoV-2 by  FDA under an Emergency Use Authorization (EUA). This EUA will remain  in effect (meaning this test can be used) for the duration of the  Covid-19 declaration under Section 564(b)(1) of the Act, 21  U.S.C. section 360bbb-3(b)(1), unless the authorization is  terminated or revoked.    Respiratory Syncytial Virus by PCR NEGATIVE NEGATIVE Final    Comment: (NOTE) Fact Sheet for Patients: PinkCheek.be  Fact Sheet for Healthcare Providers: GravelBags.it  This test is not yet approved or cleared by the [place] FDA and  has been authorized for detection and/or diagnosis of SARS-CoV-2 by  FDA under an Emergency  Use Authorization (EUA). This EUA will remain  in effect (meaning this test can be used) for the duration of the  COVID-19 declaration under Section 564(b)(1) of the Act, 21 U.S.C.  section 360bbb-3(b)(1), unless the authorization is terminated or  revoked. Performed at [facility], [place] [address] [place], [place] [address]   MRSA PCR Screening     Status: None   Collection Time: 09/13/20  6:05 PM   Specimen: Nasopharyngeal  Result Value Ref Range Status   MRSA by PCR NEGATIVE NEGATIVE Final    Comment:        The GeneXpert MRSA Assay (FDA approved for NASAL specimens only), is one component of Charlee Squibb comprehensive MRSA colonization surveillance program. It is not intended to diagnose MRSA infection nor to guide or monitor treatment for MRSA infections. Performed at [facility], [place] [address] [place], [place] [address]   Culture, blood (routine x 2)     Status: None (Preliminary result)   Collection Time: 09/16/20  5:11 AM   Specimen: BLOOD RIGHT HAND  Result Value Ref Range Status   Specimen Description   Final    BLOOD RIGHT HAND Performed at [facility] [address], [place], [place] [address]    Special Requests   Final    BOTTLES DRAWN AEROBIC ONLY Blood Culture adequate volume Performed at [facility] [address] [place], [place] [address]    Culture   Final    NO GROWTH < 12 HOURS Performed at [facility] [address], [place], [place] [address]    Report Status PENDING  Incomplete  Culture, blood (routine x 2)     Status: None (Preliminary result)   Collection Time: 09/16/20  5:11 AM   Specimen: BLOOD LEFT HAND  Result Value Ref Range Status   Specimen Description   Final    BLOOD LEFT HAND Performed at [facility] [address] [place], [place] [address]    Special Requests   Final    BOTTLES DRAWN AEROBIC ONLY Blood Culture adequate  volume Performed at [facility] [address], [place], [place] [address]    Culture   Final    NO GROWTH < 12 HOURS Performed at [facility] [address], [place], [place] [address]    Report Status PENDING  Incomplete         Radiology Studies: No results found.      Scheduled Meds:  aspirin EC  81 mg Oral Daily   chlorhexidine  15 mL Mouth Rinse BID   enoxaparin (LOVENOX) injection  40 mg Subcutaneous Q24H   erythromycin   Left Eye QID   mouth rinse  15 mL Mouth  Rinse q12n4p   metoprolol succinate  100 mg Oral Daily   mometasone-formoterol  2 puff Inhalation BID   nicotine  21 mg Transdermal Daily   sodium chloride flush  3 mL Intravenous Q12H   umeclidinium bromide  1 puff Inhalation Daily   Continuous Infusions:   LOS: 3 days    Time spent: over 30 min    Fayrene Helper, MD [facility]   To contact the attending provider between 7A-7P or the covering provider during after hours 7P-7A, please log into the web site [email or web address] and access using universal [place] password for that web site. If you do not have the password, please call the hospital operator.  09/16/2020, 7:03 PM

## 2020-09-16 NOTE — Progress Notes (Signed)
[facility] for Infectious Disease   Reason for visit: Follow up on leukocytosis  Interval History: feels about the same.  No new complaints.  No concerns.  WBC 19.9, remains afebrile.  Cultures growing GPR, GPC and Staph hominis.      Physical Exam: Constitutional:  Vitals:   09/16/20 0835 09/16/20 1304  BP:  127/81  Pulse:  94  Resp:  16  Temp:  99.1 F (37.3 C)  SpO2: 96% 97%   patient appears in NAD; interactive Respiratory: Normal respiratory effort; CTA B Cardiovascular: RRR GI: soft, nt, nd  Review of Systems: Constitutional: negative for fevers and chills Gastrointestinal: negative for nausea and diarrhea  Lab Results  Component Value Date   WBC 19.9 (H) 09/16/2020   HGB 15.7 09/16/2020   HCT 50.5 09/16/2020   MCV 93.9 09/16/2020   PLT 109 (L) 09/16/2020    Lab Results  Component Value Date   CREATININE 1.07 09/16/2020   BUN 26 (H) 09/16/2020   NA 139 09/16/2020   K 3.9 09/16/2020   CL 107 09/16/2020   CO2 25 09/16/2020    Lab Results  Component Value Date   ALT 39 09/13/2020   AST 61 (H) 09/13/2020   ALKPHOS 43 09/13/2020     Microbiology: Recent Results (from the past 240 hour(s))  Urine culture     Status: Abnormal   Collection Time: 09/13/20 10:52 AM   Specimen: In/Out Cath Urine  Result Value Ref Range Status   Specimen Description   Final    IN/OUT CATH URINE Performed at [facility], [place] [address], [place], [place] [address]    Special Requests   Final    URINE, RANDOM Performed at [facility] [address], [place], [place] [address]    Culture (A)  Final    <10,000 COLONIES/mL INSIGNIFICANT GROWTH Performed at [facility] [address] [place], [place] [address]    Report Status 09/14/2020 FINAL  Final  Blood Culture (routine x 2)     Status: None (Preliminary result)   Collection Time: 09/13/20 10:52 AM   Specimen: BLOOD  Result Value Ref Range Status   Specimen  Description   Final    BLOOD LEFT HAND Performed at [facility] [address], [place], [place] [address]    Special Requests   Final    BOTTLES DRAWN AEROBIC AND ANAEROBIC Blood Culture results may not be optimal due to an inadequate volume of blood received in culture bottles Performed at [facility] [address] [place], [place] [address]    Culture  Setup Time   Final    GRAM POSITIVE COCCI ANAEROBIC BOTTLE ONLY CRITICAL VALUE NOTED.  VALUE IS CONSISTENT WITH PREVIOUSLY REPORTED AND CALLED VALUE. AEROBIC BOTTLE ONLY GRAM POSITIVE COCCI GRAM POSITIVE RODS CRITICAL RESULT CALLED TO, READ BACK BY AND VERIFIED WITH: [facility] 09/16/20 0409 JDW    Culture   Final    CULTURE REINCUBATED FOR BETTER GROWTH Performed at [facility], [place] [address], [place], [place] [address]    Report Status PENDING  Incomplete  Blood Culture (routine x 2)     Status: Abnormal   Collection Time: 09/13/20 10:52 AM   Specimen: BLOOD  Result Value Ref Range Status   Specimen Description   Final    BLOOD LEFT ANTECUBITAL Performed at [facility] [address] [place], [place] [address]  Special Requests   Final    BOTTLES DRAWN AEROBIC AND ANAEROBIC Blood Culture results may not be optimal due to an inadequate volume of blood received in culture bottles Performed at [facility] [address] [place], [place] [address]    Culture  Setup Time   Final    GRAM POSITIVE COCCI IN CLUSTERS ANAEROBIC BOTTLE ONLY Organism ID to follow CRITICAL RESULT CALLED TO, READ BACK BY AND VERIFIED WITH: D. Wofford PharmD 12:10 09/14/20 ([email or web address])    Culture (A)  Final    STAPHYLOCOCCUS HOMINIS THE SIGNIFICANCE OF ISOLATING THIS ORGANISM FROM A SINGLE SET OF BLOOD CULTURES WHEN MULTIPLE SETS ARE DRAWN IS UNCERTAIN. PLEASE NOTIFY THE MICROBIOLOGY DEPARTMENT WITHIN ONE WEEK IF SPECIATION AND SENSITIVITIES ARE  REQUIRED. Performed at [facility], [place] [address] [place], [place] [address]    Report Status 09/16/2020 FINAL  Final  Blood Culture ID Panel (Reflexed)     Status: Abnormal   Collection Time: 09/13/20 10:52 AM  Result Value Ref Range Status   Enterococcus faecalis NOT DETECTED NOT DETECTED Final   Enterococcus Faecium NOT DETECTED NOT DETECTED Final   Listeria monocytogenes NOT DETECTED NOT DETECTED Final   Staphylococcus species DETECTED (A) NOT DETECTED Final    Comment: CRITICAL RESULT CALLED TO, READ BACK BY AND VERIFIED WITH: D. Wofford PharmD 12:10 09/14/20 ([email or web address])    Staphylococcus aureus (BCID) NOT DETECTED NOT DETECTED Final   Staphylococcus epidermidis NOT DETECTED NOT DETECTED Final   Staphylococcus lugdunensis NOT DETECTED NOT DETECTED Final   Streptococcus species NOT DETECTED NOT DETECTED Final   Streptococcus agalactiae NOT DETECTED NOT DETECTED Final   Streptococcus pneumoniae NOT DETECTED NOT DETECTED Final   Streptococcus pyogenes NOT DETECTED NOT DETECTED Final   A.calcoaceticus-baumannii NOT DETECTED NOT DETECTED Final   Bacteroides fragilis NOT DETECTED NOT DETECTED Final   Enterobacterales NOT DETECTED NOT DETECTED Final   Enterobacter cloacae complex NOT DETECTED NOT DETECTED Final   Escherichia coli NOT DETECTED NOT DETECTED Final   Klebsiella aerogenes NOT DETECTED NOT DETECTED Final   Klebsiella oxytoca NOT DETECTED NOT DETECTED Final   Klebsiella pneumoniae NOT DETECTED NOT DETECTED Final   Proteus species NOT DETECTED NOT DETECTED Final   Salmonella species NOT DETECTED NOT DETECTED Final   Serratia marcescens NOT DETECTED NOT DETECTED Final   Haemophilus influenzae NOT DETECTED NOT DETECTED Final   Neisseria meningitidis NOT DETECTED NOT DETECTED Final   Pseudomonas aeruginosa NOT DETECTED NOT DETECTED Final   Stenotrophomonas maltophilia NOT DETECTED NOT DETECTED Final   Candida albicans NOT DETECTED NOT DETECTED Final   Candida auris NOT  DETECTED NOT DETECTED Final   Candida glabrata NOT DETECTED NOT DETECTED Final   Candida krusei NOT DETECTED NOT DETECTED Final   Candida parapsilosis NOT DETECTED NOT DETECTED Final   Candida tropicalis NOT DETECTED NOT DETECTED Final   Cryptococcus neoformans/gattii NOT DETECTED NOT DETECTED Final    Comment: Performed at [facility], [address] [place], [place] [address]  Resp Panel by RT PCR (RSV, Flu A&B, Covid) - Nasopharyngeal Swab     Status: None   Collection Time: 09/13/20 11:37 AM   Specimen: Nasopharyngeal Swab  Result Value Ref Range Status   SARS Coronavirus 2 by RT PCR NEGATIVE NEGATIVE Final    Comment: (NOTE) SARS-CoV-2 target nucleic acids are NOT DETECTED.  The SARS-CoV-2 RNA is generally detectable in upper respiratoy specimens during the acute phase of infection. The lowest concentration of SARS-CoV-2 viral copies this  assay can detect is 131 copies/mL. A negative result does not preclude SARS-Cov-2 infection and should not be used as the sole basis for treatment or other patient management decisions. A negative result may occur with  improper specimen collection/handling, submission of specimen other than nasopharyngeal swab, presence of viral mutation(s) within the areas targeted by this assay, and inadequate number of viral copies (<131 copies/mL). A negative result must be combined with clinical observations, patient history, and epidemiological information. The expected result is Negative.  Fact Sheet for Patients:  PinkCheek.be  Fact Sheet for Healthcare Providers:  GravelBags.it  This test is no t yet approved or cleared by the [place] FDA and  has been authorized for detection and/or diagnosis of SARS-CoV-2 by FDA under an Emergency Use Authorization (EUA). This EUA will remain  in effect (meaning this test can be used) for the duration of the COVID-19 declaration under  Section 564(b)(1) of the Act, 21 U.S.C. section 360bbb-3(b)(1), unless the authorization is terminated or revoked sooner.     Influenza A by PCR NEGATIVE NEGATIVE Final   Influenza B by PCR NEGATIVE NEGATIVE Final    Comment: (NOTE) The Xpert Xpress SARS-CoV-2/FLU/RSV assay is intended as an aid in  the diagnosis of influenza from Nasopharyngeal swab specimens and  should not be used as a sole basis for treatment. Nasal washings and  aspirates are unacceptable for Xpert Xpress SARS-CoV-2/FLU/RSV  testing.  Fact Sheet for Patients: PinkCheek.be  Fact Sheet for Healthcare Providers: GravelBags.it  This test is not yet approved or cleared by the [place] FDA and  has been authorized for detection and/or diagnosis of SARS-CoV-2 by  FDA under an Emergency Use Authorization (EUA). This EUA will remain  in effect (meaning this test can be used) for the duration of the  Covid-19 declaration under Section 564(b)(1) of the Act, 21  U.S.C. section 360bbb-3(b)(1), unless the authorization is  terminated or revoked.    Respiratory Syncytial Virus by PCR NEGATIVE NEGATIVE Final    Comment: (NOTE) Fact Sheet for Patients: PinkCheek.be  Fact Sheet for Healthcare Providers: GravelBags.it  This test is not yet approved or cleared by the [place] FDA and  has been authorized for detection and/or diagnosis of SARS-CoV-2 by  FDA under an Emergency Use Authorization (EUA). This EUA will remain  in effect (meaning this test can be used) for the duration of the  COVID-19 declaration under Section 564(b)(1) of the Act, 21 U.S.C.  section 360bbb-3(b)(1), unless the authorization is terminated or  revoked. Performed at [facility], [place] [address], [place], [place] [address]   MRSA PCR Screening     Status: None   Collection Time: 09/13/20  6:05 PM    Specimen: Nasopharyngeal  Result Value Ref Range Status   MRSA by PCR NEGATIVE NEGATIVE Final    Comment:        The GeneXpert MRSA Assay (FDA approved for NASAL specimens only), is one component of a comprehensive MRSA colonization surveillance program. It is not intended to diagnose MRSA infection nor to guide or monitor treatment for MRSA infections. Performed at [facility], [place] [address], [place], [place] [address]   Culture, blood (routine x 2)     Status: None (Preliminary result)   Collection Time: 09/16/20  5:11 AM   Specimen: BLOOD RIGHT HAND  Result Value Ref Range Status   Specimen Description   Final    BLOOD RIGHT HAND Performed at [facility]  [address] [place], [place] [address]    Special Requests   Final    BOTTLES DRAWN AEROBIC ONLY Blood Culture adequate volume Performed at [facility] [address], [place], [place] [address]    Culture   Final    NO GROWTH < 12 HOURS Performed at [facility] [address] [place], [place] [address]    Report Status PENDING  Incomplete  Culture, blood (routine x 2)     Status: None (Preliminary result)   Collection Time: 09/16/20  5:11 AM   Specimen: BLOOD LEFT HAND  Result Value Ref Range Status   Specimen Description   Final    BLOOD LEFT HAND Performed at [facility] [address] [place], [place] [address]    Special Requests   Final    BOTTLES DRAWN AEROBIC ONLY Blood Culture adequate volume Performed at [facility] [address], [place], [place] [address]    Culture   Final    NO GROWTH < 12 HOURS Performed at [facility] [address], [place], [place] [address]    Report Status PENDING  Incomplete    Impression/Plan:  1. Positive blood cultures - most c/w a contaminate with different orgamisms.  He did receive vancomycin already.  Clinically he appears well so no  indication for further treatment.    2.  Hypothermia - resolved. Vitals otherwise stable as well.   3.  Rhabdo - CK continues to trend down.    No further antibiotics indicated.  I will sign off, call with questions.

## 2020-09-16 NOTE — TOC Progression Note (Addendum)
Transition of Care [facility]) - Progression Note    Patient Details  Name: Tommy Bauer MRN: [id number] Date of Birth: 01-Apr-1941  [facility] CM/SW Contact  Purcell Mouton, RN Phone Number: 09/16/2020, 3:14 PM  Clinical Narrative:     Spoke with pt's daughter Camie, who asked that her mother and pt's ex-wife help with making decisions related to daughter Camie Is not well. Camie, asked that information be shared with mother Abisai Deer.  Daughter Camie and Ann selected [facility] for SNF/Rehab.    Expected Discharge Plan: [place] Barriers to Discharge: Continued Medical Work up  Expected Discharge Plan and Services Expected Discharge Plan: [place]   Discharge Planning Services: CM Consult   Living arrangements for the past 2 months: Single Family Home                                       Social Determinants of Health (SDOH) Interventions    Readmission Risk Interventions No flowsheet data found.

## 2020-09-17 DIAGNOSIS — D696 Thrombocytopenia, unspecified: Secondary | ICD-10-CM | POA: Diagnosis not present

## 2020-09-17 DIAGNOSIS — F0390 Unspecified dementia without behavioral disturbance: Secondary | ICD-10-CM | POA: Diagnosis not present

## 2020-09-17 DIAGNOSIS — H3410 Central retinal artery occlusion, unspecified eye: Secondary | ICD-10-CM | POA: Diagnosis not present

## 2020-09-17 DIAGNOSIS — R918 Other nonspecific abnormal finding of lung field: Secondary | ICD-10-CM | POA: Diagnosis not present

## 2020-09-17 DIAGNOSIS — L89890 Pressure ulcer of other site, unstageable: Secondary | ICD-10-CM | POA: Diagnosis not present

## 2020-09-17 DIAGNOSIS — R54 Age-related physical debility: Secondary | ICD-10-CM | POA: Diagnosis not present

## 2020-09-17 DIAGNOSIS — R4182 Altered mental status, unspecified: Secondary | ICD-10-CM | POA: Diagnosis not present

## 2020-09-17 DIAGNOSIS — Z720 Tobacco use: Secondary | ICD-10-CM | POA: Diagnosis not present

## 2020-09-17 DIAGNOSIS — S71001D Unspecified open wound, right hip, subsequent encounter: Secondary | ICD-10-CM | POA: Diagnosis not present

## 2020-09-17 DIAGNOSIS — R0989 Other specified symptoms and signs involving the circulatory and respiratory systems: Secondary | ICD-10-CM | POA: Diagnosis not present

## 2020-09-17 DIAGNOSIS — U071 COVID-19: Secondary | ICD-10-CM | POA: Diagnosis not present

## 2020-09-17 DIAGNOSIS — R627 Adult failure to thrive: Secondary | ICD-10-CM | POA: Diagnosis not present

## 2020-09-17 DIAGNOSIS — Z8616 Personal history of COVID-19: Secondary | ICD-10-CM | POA: Diagnosis not present

## 2020-09-17 DIAGNOSIS — N179 Acute kidney failure, unspecified: Secondary | ICD-10-CM | POA: Diagnosis not present

## 2020-09-17 DIAGNOSIS — L03213 Periorbital cellulitis: Secondary | ICD-10-CM | POA: Diagnosis not present

## 2020-09-17 DIAGNOSIS — Z8673 Personal history of transient ischemic attack (TIA), and cerebral infarction without residual deficits: Secondary | ICD-10-CM | POA: Diagnosis not present

## 2020-09-17 DIAGNOSIS — L03115 Cellulitis of right lower limb: Secondary | ICD-10-CM | POA: Diagnosis not present

## 2020-09-17 DIAGNOSIS — R269 Unspecified abnormalities of gait and mobility: Secondary | ICD-10-CM | POA: Diagnosis not present

## 2020-09-17 DIAGNOSIS — R9431 Abnormal electrocardiogram [ECG] [EKG]: Secondary | ICD-10-CM | POA: Diagnosis not present

## 2020-09-17 DIAGNOSIS — H109 Unspecified conjunctivitis: Secondary | ICD-10-CM | POA: Diagnosis not present

## 2020-09-17 DIAGNOSIS — L853 Xerosis cutis: Secondary | ICD-10-CM | POA: Diagnosis not present

## 2020-09-17 DIAGNOSIS — T68XXXA Hypothermia, initial encounter: Secondary | ICD-10-CM | POA: Diagnosis not present

## 2020-09-17 DIAGNOSIS — R221 Localized swelling, mass and lump, neck: Secondary | ICD-10-CM | POA: Diagnosis not present

## 2020-09-17 DIAGNOSIS — T68XXXD Hypothermia, subsequent encounter: Secondary | ICD-10-CM | POA: Diagnosis not present

## 2020-09-17 DIAGNOSIS — R5381 Other malaise: Secondary | ICD-10-CM | POA: Diagnosis not present

## 2020-09-17 DIAGNOSIS — R7303 Prediabetes: Secondary | ICD-10-CM | POA: Diagnosis not present

## 2020-09-17 DIAGNOSIS — M6281 Muscle weakness (generalized): Secondary | ICD-10-CM | POA: Diagnosis not present

## 2020-09-17 DIAGNOSIS — R2689 Other abnormalities of gait and mobility: Secondary | ICD-10-CM | POA: Diagnosis not present

## 2020-09-17 DIAGNOSIS — L89894 Pressure ulcer of other site, stage 4: Secondary | ICD-10-CM | POA: Diagnosis not present

## 2020-09-17 DIAGNOSIS — D72829 Elevated white blood cell count, unspecified: Secondary | ICD-10-CM | POA: Diagnosis not present

## 2020-09-17 DIAGNOSIS — Z8546 Personal history of malignant neoplasm of prostate: Secondary | ICD-10-CM | POA: Diagnosis not present

## 2020-09-17 DIAGNOSIS — R9401 Abnormal electroencephalogram [EEG]: Secondary | ICD-10-CM | POA: Diagnosis not present

## 2020-09-17 DIAGNOSIS — M2550 Pain in unspecified joint: Secondary | ICD-10-CM | POA: Diagnosis not present

## 2020-09-17 DIAGNOSIS — R413 Other amnesia: Secondary | ICD-10-CM | POA: Diagnosis not present

## 2020-09-17 DIAGNOSIS — I6930 Unspecified sequelae of cerebral infarction: Secondary | ICD-10-CM | POA: Diagnosis not present

## 2020-09-17 DIAGNOSIS — J918 Pleural effusion in other conditions classified elsewhere: Secondary | ICD-10-CM | POA: Diagnosis not present

## 2020-09-17 DIAGNOSIS — E785 Hyperlipidemia, unspecified: Secondary | ICD-10-CM | POA: Diagnosis not present

## 2020-09-17 DIAGNOSIS — Z7401 Bed confinement status: Secondary | ICD-10-CM | POA: Diagnosis not present

## 2020-09-17 DIAGNOSIS — I10 Essential (primary) hypertension: Secondary | ICD-10-CM | POA: Diagnosis not present

## 2020-09-17 DIAGNOSIS — R531 Weakness: Secondary | ICD-10-CM | POA: Diagnosis not present

## 2020-09-17 DIAGNOSIS — H349 Unspecified retinal vascular occlusion: Secondary | ICD-10-CM | POA: Diagnosis not present

## 2020-09-17 DIAGNOSIS — J9811 Atelectasis: Secondary | ICD-10-CM | POA: Diagnosis not present

## 2020-09-17 DIAGNOSIS — R296 Repeated falls: Secondary | ICD-10-CM | POA: Diagnosis not present

## 2020-09-17 DIAGNOSIS — L89893 Pressure ulcer of other site, stage 3: Secondary | ICD-10-CM | POA: Diagnosis not present

## 2020-09-17 DIAGNOSIS — W19XXXA Unspecified fall, initial encounter: Secondary | ICD-10-CM | POA: Diagnosis not present

## 2020-09-17 DIAGNOSIS — R600 Localized edema: Secondary | ICD-10-CM | POA: Diagnosis not present

## 2020-09-17 LAB — COMPREHENSIVE METABOLIC PANEL
ALT: 23 U/L (ref 0–44)
AST: 28 U/L (ref 15–41)
Albumin: 2.3 g/dL — ABNORMAL LOW (ref 3.5–5.0)
Alkaline Phosphatase: 35 U/L — ABNORMAL LOW (ref 38–126)
Anion gap: 11 (ref 5–15)
BUN: 20 mg/dL (ref 8–23)
CO2: 25 mmol/L (ref 22–32)
Calcium: 8.1 mg/dL — ABNORMAL LOW (ref 8.9–10.3)
Chloride: 102 mmol/L (ref 98–111)
Creatinine, Ser: 1.33 mg/dL — ABNORMAL HIGH (ref 0.61–1.24)
GFR, Estimated: 54 mL/min — ABNORMAL LOW (ref >60)
Glucose, Bld: 105 mg/dL — ABNORMAL HIGH (ref 70–99)
Potassium: 3.8 mmol/L (ref 3.5–5.1)
Sodium: 138 mmol/L (ref 135–145)
Total Bilirubin: 0.8 mg/dL (ref 0.3–1.2)
Total Protein: 5.5 g/dL — ABNORMAL LOW (ref 6.5–8.1)

## 2020-09-17 LAB — PHOSPHORUS: Phosphorus: 2.5 mg/dL (ref 2.5–4.6)

## 2020-09-17 LAB — CBC WITH DIFFERENTIAL/PLATELET
Abs Immature Granulocytes: 0.85 10*3/uL — ABNORMAL HIGH (ref 0.00–0.07)
Basophils Absolute: 0.1 10*3/uL (ref 0.0–0.1)
Basophils Relative: 0 %
Eosinophils Absolute: 0.2 10*3/uL (ref 0.0–0.5)
Eosinophils Relative: 1 %
HCT: 43 % (ref 39.0–52.0)
Hemoglobin: 13.7 g/dL (ref 13.0–17.0)
Immature Granulocytes: 4 %
Lymphocytes Relative: 7 %
Lymphs Abs: 1.4 10*3/uL (ref 0.7–4.0)
MCH: 29.7 pg (ref 26.0–34.0)
MCHC: 31.9 g/dL (ref 30.0–36.0)
MCV: 93.1 fL (ref 80.0–100.0)
Monocytes Absolute: 2.2 10*3/uL — ABNORMAL HIGH (ref 0.1–1.0)
Monocytes Relative: 11 %
Neutro Abs: 15.6 10*3/uL — ABNORMAL HIGH (ref 1.7–7.7)
Neutrophils Relative %: 77 %
Platelets: 143 10*3/uL — ABNORMAL LOW (ref 150–400)
RBC: 4.62 MIL/uL (ref 4.22–5.81)
RDW: 13.8 % (ref 11.5–15.5)
WBC: 20.3 10*3/uL — ABNORMAL HIGH (ref 4.0–10.5)
nRBC: 0 % (ref 0.0–0.2)

## 2020-09-17 LAB — RESP PANEL BY RT-PCR (FLU A&B, COVID) ARPGX2
Influenza A by PCR: NEGATIVE
Influenza B by PCR: NEGATIVE
SARS Coronavirus 2 by RT PCR: NEGATIVE

## 2020-09-17 LAB — CULTURE, BLOOD (ROUTINE X 2)

## 2020-09-17 LAB — MAGNESIUM: Magnesium: 2.3 mg/dL (ref 1.7–2.4)

## 2020-09-17 LAB — CK: Total CK: 254 U/L (ref 49–397)

## 2020-09-17 MED ORDER — THIAMINE HCL 100 MG PO TABS: 100.0000 mg | ORAL_TABLET | Freq: Every day | ORAL | 0 refills | Status: AC

## 2020-09-17 MED ORDER — LACTATED RINGERS IV SOLN: INTRAVENOUS | Status: DC

## 2020-09-17 MED ORDER — ADULT MULTIVITAMIN W/MINERALS CH: 1.0000 | ORAL_TABLET | Freq: Every day | ORAL | 0 refills | Status: AC

## 2020-09-17 MED ORDER — FOLIC ACID 1 MG PO TABS: 1.0000 mg | ORAL_TABLET | Freq: Every day | ORAL | 0 refills | Status: AC

## 2020-09-17 MED ORDER — ERYTHROMYCIN 5 MG/GM OP OINT: TOPICAL_OINTMENT | Freq: Four times a day (QID) | OPHTHALMIC | 0 refills | Status: AC

## 2020-09-17 NOTE — TOC Progression Note (Signed)
Transition of Care [facility]) - Progression Note    Patient Details  Name: Tommy Bauer MRN: [id number] Date of Birth: 08-13-1941  [facility] of [facility] CM/SW Contact  Purcell Mouton, RN Phone Number: 09/17/2020, 3:46 PM  Clinical Narrative:    Transportation PTAR was called. [place] aware of Negative COVID. Pt's daughter and ex-wife called to inform of pt's discharge to SNF.   Expected Discharge Plan: [place] Barriers to Discharge: Continued Medical Work up  Expected Discharge Plan and Services Expected Discharge Plan: [place]   Discharge Planning Services: CM Consult   Living arrangements for the past 2 months: Single Family Home Expected Discharge Date: 09/17/20                                     Social Determinants of Health (SDOH) Interventions    Readmission Risk Interventions No flowsheet data found.

## 2020-09-17 NOTE — Discharge Summary (Signed)
Physician Discharge Summary  Tommy Bauer QJJ:[id number] DOB: 1941/10/26 DOA: 09/13/2020  PCP: [name], NP  Admit date: 09/13/2020 Discharge date: 09/17/2020  Time spent: 40 minutes  Recommendations for Outpatient Follow-up:  1. Follow outpatient CBC/CMP 2. Persistent leukocytosis, afebrile, negative w/u so far, follow WBC outpatient  3. Follow creatinine outpatient 4. Follow final blood cx outpatient  5. Follow with ophtho outpatient  6. Follow blood pressure outpatient - ace and thiazide discontinued 7. Follow with neurology outpatient - abnormal EEG 8. Follow repeat UA outpatient for hematuria 9. Continue to follow up regarding safe d/c post rehab - concern for hoarding at home - would recommend social work follow at [facility]  Discharge Diagnoses:  Principal Problem:   Hypothermia Active Problems:   Essential hypertension   Central retinal artery occlusion   AMS (altered mental status)   AKI (acute kidney injury) ([place])   Conjunctivitis   Rhabdomyolysis  Discharge Condition: stable  Diet recommendation: heart healthy  Filed Weights   09/13/20 2110 09/14/20 0500 09/16/20 0500  Weight: 83.4 kg 83.3 kg 91 kg   History of present illness:  79 y.o.malewith Tommy Bauer past medical history of prostate cancer in 2011, CVA, hypertension, alcohol use, hyperlipidemia, central retinal artery occlusion who presented to the ED via EMS today after his ex-wife found him on the floor. He was hypothermic on presentation with concern for sepsis and he was started on broad spectrum antibiotics as well as rewarmed and given abx per sepsis protocol.  He was also encephalopathic.  He's gradually improved.  Blood cultures from presentation were concerning for contaminants.  ID was consulted and abx have been discontinued.  His repeat blood cx from 11/17 are so far no growth.  He appears to be closer to baseline at this point.  Plan for discharge to SNF on 11/18.  Suspect presentation was related to  hypothermia after being found down and unable to get up with hypothermia.  See below for additional details See H&P for additional details regarding history  Hospital Course:  # Concern for Sepsis  Hypothermia: Sepsis ruled out with no source of infection Suspect his presentation was related to being found down and developing hypothermia after he was unable to get up Presented with hypothermia, tachycardia, tachypnea, leukocytosis, and acidosis Cx from 11/14 with staph hominis, staph carnosus, diphtheroids - suspect this represents contaminant Repeat blood cx from 11/17 no growth x1 day- follow final cx results ID was c/s, suspected contaminant - abx have been d/c'd Urine cx with <10,000 CFU Continue to follow off antibiotics Echo without evidence of valvular vegetations Persistent leukocytosis, follow outpatient   2. Left eye conjunctivitis Likely bacterial. Patient with history of central retinal artery occlusion. Continue erythromycin ointment and eye care. Outpatient follow-up with ophtho.   3. Mild rhabdomyolysis Improved  4. Acute metabolic encephalopathy Likely secondary to problem #1 and dehydration. Head CT unremarkable. On admission there was concern that patient may have bitten his tongue and as such seizures Gregoire Bennis part of the differential.  MRI brain with no acute abnormalities. P EEG suggestive of nonspecific cortical dysfunctionL > Rin the temporal region. No seizures or epileptiform discharges seen throughout the recording.  Findings discussed with neurology by previous MD who reviewed imaging and felt patient likely did not have Elleen Coulibaly seizure and at this time does not need to be placed on any antiepileptic medications.  TSH within normal limits.  Follow up with neurology outpatient given abnormal EEG  # Thrombocytopenia: improving on day of discharge, continue to  monitor  5. Acute kidney injury  Hematuria Secondary to prerenal azotemia in the setting  of ACE inhibitor. Renal function improved with hydration. Continue to hold ACE inhibitor and thiazide. Creatinine bumped to 1.33 on day of discharge - continue to monitor outpatient Hematuria on presentation, follow repeat labs outpatient   6. Hypertension Currently controlled. Continue Toprol-XL. Continue to hold ACE inhibitor and thiazide with AKI and BP is ok.  7. History of CVA Continue aspirin, statin. Risk factor modification.  8. History of alcohol use Unknown last drink. Alcohol level negative. No signs of withdrawal.Monitor closely. Follow. Thiamine, folate, multivitamin  10. Tobacco abuse Patient with ongoing tobacco use. States he understands the need for tobacco cessation. Placed on nicotine patch.   Procedures: Echo IMPRESSIONS    1. Left ventricular ejection fraction, by estimation, is 55 to 60%. The  left ventricle has normal function. Left ventricular endocardial border  not optimally defined to evaluate regional wall motion. Left ventricular  diastolic parameters are  indeterminate.  2. Right ventricular systolic function is normal. The right ventricular  size is normal.  3. The mitral valve is normal in structure. Trivial mitral valve  regurgitation. No evidence of mitral stenosis.  4. The aortic valve is normal in structure. Aortic valve regurgitation is  not visualized. No aortic stenosis is present.  5. The inferior vena cava is normal in size with greater than 50%  respiratory variability, suggesting right atrial pressure of 3 mmHg.   Conclusion(s)/Recommendation(s): No evidence of valvular vegetations on  this transthoracic echocardiogram. Would recommend Melannie Metzner transesophageal  echocardiogram to exclude infective endocarditis if clinically indicated.  EEG IMPRESSION: This study is suggestive of nonspecific cortical dysfunction in left more than right temporal region.  No seizures or epileptiform discharges were seen throughout  the recording.   Consultations:  ID  Discharge Exam: Vitals:   09/17/20 0841 09/17/20 1402  BP:  119/73  Pulse:  91  Resp:  16  Temp:  98.7 F (37.1 C)  SpO2: 95% (!) 89%   No new complaints Ex wife at bedside  General: No acute distress. Mild L periorbital swelling, subconjunctival hemorrhage, no reported visual defect Cardiovascular: Heart sounds show Cereniti Curb regular rate, and rhythm Lungs: Clear to auscultation bilaterally Abdomen: Soft, nontender, nondistended Neurological: Alert and oriented 3. Moves all extremities 4. Cranial nerves II through XII grossly intact. Skin: Warm and dry. No rashes or lesions. Extremities: No clubbing or cyanosis. No edema.  Discharge Instructions   Discharge Instructions    Ambulatory referral to Neurology   Complete by: As directed    An appointment is requested in approximately: 4 weeks   Call MD for:  difficulty breathing, headache or visual disturbances   Complete by: As directed    Call MD for:  extreme fatigue   Complete by: As directed    Call MD for:  hives   Complete by: As directed    Call MD for:  persistant dizziness or light-headedness   Complete by: As directed    Call MD for:  persistant nausea and vomiting   Complete by: As directed    Call MD for:  redness, tenderness, or signs of infection (pain, swelling, redness, odor or green/yellow discharge around incision site)   Complete by: As directed    Call MD for:  severe uncontrolled pain   Complete by: As directed    Call MD for:  temperature >100.4   Complete by: As directed    Diet - low sodium heart healthy  Complete by: As directed    Discharge instructions   Complete by: As directed    You were seen after being found down with hypothermia (cold body temperature).  You've improved in the hospital with treatment for possible infection and rewarming.   Your blood cultures had contaminants.  Your repeat cultures are so far no growth.  Follow up your pending  blood cultures with your PCP.  We'll discharge you home with Kollin Udell plan for outpatient follow up with your PCP.  Your benazepril and chlorthalidone were stopped.  Your blood pressures are ok, continue to hold these as you're discharged and follow up your blood pressure and labs with your PCP.  Stop your potassium for now as well.  Follow up with your eye doctor as an outpatient.  Follow up with neurology outpatient for your abnormal EEG.  Return for new, recurrent, or worsening symptoms.  Please ask your PCP to request records from this hospitalization so they know what was done and what the next steps will be.   Discharge wound care:   Complete by: As directed    Continue frequent turns and local wound care   Increase activity slowly   Complete by: As directed      Allergies as of 09/17/2020   No Known Allergies     Medication List    STOP taking these medications   benazepril 40 MG tablet Commonly known as: LOTENSIN   chlorthalidone 25 MG tablet Commonly known as: HYGROTON   potassium chloride SA 20 MEQ tablet Commonly known as: KLOR-CON     TAKE these medications   acetaminophen 500 MG tablet Commonly known as: TYLENOL Take 500 mg by mouth every 4 (four) hours as needed for moderate pain.   aspirin 81 MG tablet Take 1 tablet (81 mg total) by mouth daily.   atorvastatin 40 MG tablet Commonly known as: LIPITOR Take 1 tablet (40 mg total) by mouth daily.   bimatoprost 0.03 % ophthalmic solution Commonly known as: LUMIGAN Place 1 drop into both eyes at bedtime.   erythromycin ophthalmic ointment Place into the left eye 4 (four) times daily for 3 days.   folic acid 1 MG tablet Commonly known as: FOLVITE Take 1 tablet (1 mg total) by mouth daily. Start taking on: September 18, 2020   metoprolol succinate 100 MG 24 hr tablet Commonly known as: TOPROL-XL TAKE ONE TABLET BY MOUTH ONCE DAILY TAKE  WITH  OR  IMMEDIATELY  FOLLOWING  Dorrine Montone  MEAL What changed:   how much to  take  how to take this  when to take this  additional instructions   multivitamin with minerals Tabs tablet Take 1 tablet by mouth daily. Start taking on: September 18, 2020   thiamine 100 MG tablet Take 1 tablet (100 mg total) by mouth daily. Start taking on: September 18, 2020            Discharge Care Instructions  (From admission, onward)         Start     Ordered   09/17/20 0000  Discharge wound care:       Comments: Continue frequent turns and local wound care   09/17/20 1402         No Known Allergies  Contact information for after-discharge care    Destination    [facility] Preferred SNF .   Service: Skilled Nursing Contact information: [address] [place] [phone]  The results of significant diagnostics from this hospitalization (including imaging, microbiology, ancillary and laboratory) are listed below for reference.    Significant Diagnostic Studies: CT Head Wo Contrast  Result Date: 09/13/2020 CLINICAL DATA:  Mental status change. EXAM: CT HEAD WITHOUT CONTRAST TECHNIQUE: Contiguous axial images were obtained from the base of the skull through the vertex without intravenous contrast. COMPARISON:  None. FINDINGS: Brain: No evidence of acute infarction, hemorrhage, hydrocephalus, extra-axial collection or mass lesion/mass effect. Moderate brain parenchymal volume loss and deep white matter microangiopathy. Vascular: Heavy calcific atherosclerotic disease of the intracranial arteries. Skull: Normal. Negative for fracture or focal lesion. Sinuses/Orbits: Near complete opacification of the right maxillary sinus. Partial opacification of the ethmoid sinuses and left frontal sinus. Other: None. IMPRESSION: 1. No acute intracranial abnormality. 2. Moderate brain parenchymal atrophy and chronic microvascular disease. 3. Chronic sinusitis. Electronically Signed   By: Fidela [facility] M.D.    On: 09/13/2020 12:30   CT Cervical Spine Wo Contrast  Result Date: 09/13/2020 CLINICAL DATA:  Unwitnessed fall.  Mental status change. EXAM: CT CERVICAL SPINE WITHOUT CONTRAST TECHNIQUE: Multidetector CT imaging of the cervical spine was performed without intravenous contrast. Multiplanar CT image reconstructions were also generated. COMPARISON:  None. FINDINGS: Alignment: Straightening of the cervical lordosis. Skull base and vertebrae: No acute fracture. No primary bone lesion or focal pathologic process. 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 Soft tissues and spinal canal: No prevertebral fluid or swelling. No visible canal hematoma. Disc levels:  Multilevel osteoarthritic changes. Upper chest: Negative. Other: None. IMPRESSION: 1. No evidence of acute traumatic injury to the cervical spine. 2. 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 3. Multilevel osteoarthritic changes of the cervical spine. Electronically Signed   By: Fidela [facility] M.D.   On: 09/13/2020 12:35   MR BRAIN WO CONTRAST  Result Date: 09/14/2020 CLINICAL DATA:  Mental status change with unknown cause, possible CVA EXAM: MRI HEAD WITHOUT CONTRAST TECHNIQUE: Multiplanar, multiecho pulse sequences of the brain and surrounding structures were obtained without intravenous contrast. COMPARISON:  Head CT from yesterday FINDINGS: Brain: No acute infarction, hemorrhage, hydrocephalus, extra-axial collection or mass lesion. Brain atrophy most notable in the parietal and temporal areas. Confluent chronic small vessel ischemia to Sedona Wenk moderate degree in the periventricular white matter. Vascular: Preserved flow voids for technique. Skull and upper cervical spine: No focal marrow lesion. Right more than left parietal scalp swelling. Sinuses/Orbits: Large retention cyst appearance in the right maxillary sinus. There is also opacification of Artemis Koller right anterior ethmoid air cell. IMPRESSION: 1. Motion degraded  study without acute intracranial finding. 2. Brain atrophy and moderate chronic small vessel ischemia. Electronically Signed   By: Monte Fantasia M.D.   On: 09/14/2020 08:40   DG Pelvis Portable  Result Date: 09/13/2020 CLINICAL DATA:  Fall EXAM: PORTABLE PELVIS 1-2 VIEWS COMPARISON:  September 22, 2009 FINDINGS: There is no evidence of pelvic fracture or dislocation slight symmetric narrowing of each hip joint. No erosive change. IMPRESSION: Slight symmetric narrowing of each hip joint. No fracture or dislocation. Electronically Signed   By: Lowella Grip III M.D.   On: 09/13/2020 13:49   DG Chest Port 1 View  Result Date: 09/13/2020 CLINICAL DATA:  Hypertension with concern for sepsis EXAM: PORTABLE CHEST 1 VIEW COMPARISON:  September 11, 2009. FINDINGS: There is stable elevation of the left hemidiaphragm with left base atelectasis. The lungs elsewhere are clear. Heart size and pulmonary vascularity are normal. No adenopathy. There is degenerative change in the  thoracic spine. IMPRESSION: Stable elevation of the left hemidiaphragm with left base atelectasis. Lungs elsewhere clear. Stable cardiac silhouette. Electronically Signed   By: Lowella Grip III M.D.   On: 09/13/2020 12:17   EEG adult  Result Date: 09/14/2020 Lora Havens, MD     09/14/2020  3:51 PM Patient Name: Maleke Feria MRN: [id number] Epilepsy Attending: Lora Havens Referring Physician/Provider: Dr. Marva Panda Date: 11/15/201 Duration: 23.16 mins Patient history: 79 year old male with altered mental status. Level of alertness: Awake AEDs during EEG study: None Technical aspects: This EEG study was done with scalp electrodes positioned according to the 10-20 International system of electrode placement. Electrical activity was acquired at Dahlila Pfahler sampling rate of 500Hz  and reviewed with Ramah Langhans high frequency filter of 70Hz  and Mireille Lacombe low frequency filter of 1Hz . EEG data were recorded continuously and digitally stored. Description: The  posterior dominant rhythm consists of 8 Hz activity of moderate voltage (25-35 uV) seen predominantly in posterior head regions, symmetric and reactive to eye opening and eye closing.  EEG showed intermittent 3 to 5 Hz theta-delta slowing again left more than right temporal region.  Hyperventilation and photic stimulation were not performed.   ABNORMALITY -Intermittent slow, left more than right temporal region IMPRESSION: This study is suggestive of nonspecific cortical dysfunction in left more than right temporal region.  No seizures or epileptiform discharges were seen throughout the recording. Lora Havens   ECHOCARDIOGRAM COMPLETE  Result Date: 09/14/2020    ECHOCARDIOGRAM REPORT   Patient Name:   NIKODEM LEADBETTER Date of Exam: 09/14/2020 Medical Rec #:  [id number]   Height:       74.0 in Accession #:    [id number]  Weight:       183.6 lb Date of Birth:  10/09/1941   BSA:          2.096 m Patient Age:    24 years    BP:           141/83 mmHg Patient Gender: M           HR:           79 bpm. Exam Location:  Inpatient Procedure: 2D Echo Indications:    bacteremia  History:        Patient has no prior history of Echocardiogram examinations.                 Risk Factors:Hypertension and Current Smoker. CVA. tobacco                 abuse.  Sonographer:    Jannett Celestine RDCS (AE) Referring Phys: [id number] DANIEL V THOMPSON  Sonographer Comments: No parasternal window. Restricted mobility and Image acquisition challenging due to respiratory motion. IMPRESSIONS  1. Left ventricular ejection fraction, by estimation, is 55 to 60%. The left ventricle has normal function. Left ventricular endocardial border not optimally defined to evaluate regional wall motion. Left ventricular diastolic parameters are indeterminate.  2. Right ventricular systolic function is normal. The right ventricular size is normal.  3. The mitral valve is normal in structure. Trivial mitral valve regurgitation. No evidence of mitral stenosis.  4. The  aortic valve is normal in structure. Aortic valve regurgitation is not visualized. No aortic stenosis is present.  5. The inferior vena cava is normal in size with greater than 50% respiratory variability, suggesting right atrial pressure of 3 mmHg. Conclusion(s)/Recommendation(s): No evidence of valvular vegetations on this transthoracic echocardiogram. Would recommend Cambelle Suchecki transesophageal echocardiogram to exclude infective endocarditis  if clinically indicated. FINDINGS  Left Ventricle: Left ventricular ejection fraction, by estimation, is 55 to 60%. The left ventricle has normal function. Left ventricular endocardial border not optimally defined to evaluate regional wall motion. The left ventricular internal cavity size was normal in size. There is no left ventricular hypertrophy. Left ventricular diastolic parameters are indeterminate. Right Ventricle: The right ventricular size is normal. No increase in right ventricular wall thickness. Right ventricular systolic function is normal. Left Atrium: Left atrial size was normal in size. Right Atrium: Right atrial size was normal in size. Pericardium: There is no evidence of pericardial effusion. Mitral Valve: The mitral valve is normal in structure. Trivial mitral valve regurgitation. No evidence of mitral valve stenosis. Tricuspid Valve: The tricuspid valve is normal in structure. Tricuspid valve regurgitation is mild . No evidence of tricuspid stenosis. Aortic Valve: The aortic valve is normal in structure. Aortic valve regurgitation is not visualized. No aortic stenosis is present. Pulmonic Valve: The pulmonic valve was normal in structure. Pulmonic valve regurgitation is not visualized. No evidence of pulmonic stenosis. Aorta: The aortic root is normal in size and structure. Venous: The inferior vena cava is normal in size with greater than 50% respiratory variability, suggesting right atrial pressure of 3 mmHg. IAS/Shunts: No atrial level shunt detected by color  flow Doppler.  AORTIC VALVE LVOT Vmax:   80.30 cm/s LVOT Vmean:  61.400 cm/s LVOT VTI:    0.172 m  SHUNTS Systemic VTI: 0.17 m Candee Furbish MD Electronically signed by Candee Furbish MD Signature Date/Time: 09/14/2020/2:44:25 PM    Final     Microbiology: Recent Results (from the past 240 hour(s))  Urine culture     Status: Abnormal   Collection Time: 09/13/20 10:52 AM   Specimen: In/Out Cath Urine  Result Value Ref Range Status   Specimen Description   Final    IN/OUT CATH URINE Performed at [facility], [place] [address] [place], [place] [address]    Special Requests   Final    URINE, RANDOM Performed at [facility] [address], [place], [place] [address]    Culture (Moni Rothrock)  Final    <10,000 COLONIES/mL INSIGNIFICANT GROWTH Performed at [facility] [address] [place], [place] [address]    Report Status 09/14/2020 FINAL  Final  Blood Culture (routine x 2)     Status: Abnormal   Collection Time: 09/13/20 10:52 AM   Specimen: BLOOD  Result Value Ref Range Status   Specimen Description   Final    BLOOD LEFT HAND Performed at [facility] [address], [place], [place] [address]    Special Requests   Final    BOTTLES DRAWN AEROBIC AND ANAEROBIC Blood Culture results may not be optimal due to an inadequate volume of blood received in culture bottles Performed at [facility] [address] [place], [place] [address]    Culture  Setup Time   Final    GRAM POSITIVE COCCI ANAEROBIC BOTTLE ONLY CRITICAL VALUE NOTED.  VALUE IS CONSISTENT WITH PREVIOUSLY REPORTED AND CALLED VALUE. AEROBIC BOTTLE ONLY GRAM POSITIVE COCCI GRAM POSITIVE RODS CRITICAL RESULT CALLED TO, READ BACK BY AND VERIFIED WITH: E J[facility]SON [facility] 09/16/20 0409 JDW    Culture (Paisley Grajeda)  Final    STAPHYLOCOCCUS CARNOSUS THE SIGNIFICANCE OF ISOLATING THIS ORGANISM FROM Adelaide Pfefferkorn SINGLE SET OF BLOOD CULTURES WHEN MULTIPLE SETS ARE DRAWN IS  UNCERTAIN. PLEASE NOTIFY THE MICROBIOLOGY DEPARTMENT WITHIN ONE WEEK IF SPECIATION  AND SENSITIVITIES ARE REQUIRED. DIPHTHEROIDS(CORYNEBACTERIUM SPECIES) Standardized susceptibility testing for this organism is not available. Performed at [facility], [place] [address] [place], [place] [address]    Report Status 09/17/2020 FINAL  Final  Blood Culture (routine x 2)     Status: Abnormal   Collection Time: 09/13/20 10:52 AM   Specimen: BLOOD  Result Value Ref Range Status   Specimen Description   Final    BLOOD LEFT ANTECUBITAL Performed at [facility] [address] [place], [place] [address]    Special Requests   Final    BOTTLES DRAWN AEROBIC AND ANAEROBIC Blood Culture results may not be optimal due to an inadequate volume of blood received in culture bottles Performed at [facility] [address], [place], [place] [address]    Culture  Setup Time   Final    GRAM POSITIVE COCCI IN CLUSTERS ANAEROBIC BOTTLE ONLY Organism ID to follow CRITICAL RESULT CALLED TO, READ BACK BY AND VERIFIED WITH: D. Wofford PharmD 12:10 09/14/20 ([email or web address])    Culture (Debany Vantol)  Final    STAPHYLOCOCCUS HOMINIS THE SIGNIFICANCE OF ISOLATING THIS ORGANISM FROM Shana Younge SINGLE SET OF BLOOD CULTURES WHEN MULTIPLE SETS ARE DRAWN IS UNCERTAIN. PLEASE NOTIFY THE MICROBIOLOGY DEPARTMENT WITHIN ONE WEEK IF SPECIATION AND SENSITIVITIES ARE REQUIRED. Performed at [facility], [place] [address], [place], [place] [address]    Report Status 09/16/2020 FINAL  Final  Blood Culture ID Panel (Reflexed)     Status: Abnormal   Collection Time: 09/13/20 10:52 AM  Result Value Ref Range Status   Enterococcus faecalis NOT DETECTED NOT DETECTED Final   Enterococcus Faecium NOT DETECTED NOT DETECTED Final   Listeria monocytogenes NOT DETECTED NOT DETECTED Final   Staphylococcus species DETECTED (Hazael Olveda) NOT DETECTED Final    Comment: CRITICAL RESULT CALLED TO, READ BACK BY AND VERIFIED  WITH: D. Wofford PharmD 12:10 09/14/20 ([email or web address])    Staphylococcus aureus (BCID) NOT DETECTED NOT DETECTED Final   Staphylococcus epidermidis NOT DETECTED NOT DETECTED Final   Staphylococcus lugdunensis NOT DETECTED NOT DETECTED Final   Streptococcus species NOT DETECTED NOT DETECTED Final   Streptococcus agalactiae NOT DETECTED NOT DETECTED Final   Streptococcus pneumoniae NOT DETECTED NOT DETECTED Final   Streptococcus pyogenes NOT DETECTED NOT DETECTED Final   Brix Brearley.calcoaceticus-baumannii NOT DETECTED NOT DETECTED Final   Bacteroides fragilis NOT DETECTED NOT DETECTED Final   Enterobacterales NOT DETECTED NOT DETECTED Final   Enterobacter cloacae complex NOT DETECTED NOT DETECTED Final   Escherichia coli NOT DETECTED NOT DETECTED Final   Klebsiella aerogenes NOT DETECTED NOT DETECTED Final   Klebsiella oxytoca NOT DETECTED NOT DETECTED Final   Klebsiella pneumoniae NOT DETECTED NOT DETECTED Final   Proteus species NOT DETECTED NOT DETECTED Final   Salmonella species NOT DETECTED NOT DETECTED Final   Serratia marcescens NOT DETECTED NOT DETECTED Final   Haemophilus influenzae NOT DETECTED NOT DETECTED Final   Neisseria meningitidis NOT DETECTED NOT DETECTED Final   Pseudomonas aeruginosa NOT DETECTED NOT DETECTED Final   Stenotrophomonas maltophilia NOT DETECTED NOT DETECTED Final   Candida albicans NOT DETECTED NOT DETECTED Final   Candida auris NOT DETECTED NOT DETECTED Final   Candida glabrata NOT DETECTED NOT DETECTED Final   Candida krusei NOT DETECTED NOT DETECTED Final   Candida parapsilosis NOT DETECTED NOT DETECTED Final   Candida tropicalis NOT DETECTED NOT DETECTED Final   Cryptococcus neoformans/gattii NOT DETECTED NOT DETECTED Final    Comment: Performed at [facility]  Hospital Lab, [place] [address], [place], [place] [address]  Resp Panel by RT PCR (RSV, Flu Marinda Tyer&B, Covid) - Nasopharyngeal Swab     Status: None   Collection Time: 09/13/20 11:37 AM   Specimen: Nasopharyngeal Swab   Result Value Ref Range Status   SARS Coronavirus 2 by RT PCR NEGATIVE NEGATIVE Final    Comment: (NOTE) SARS-CoV-2 target nucleic acids are NOT DETECTED.  The SARS-CoV-2 RNA is generally detectable in upper respiratoy specimens during the acute phase of infection. The lowest concentration of SARS-CoV-2 viral copies this assay can detect is 131 copies/mL. Havard Radigan negative result does not preclude SARS-Cov-2 infection and should not be used as the sole basis for treatment or other patient management decisions. Keandra Medero negative result may occur with  improper specimen collection/handling, submission of specimen other than nasopharyngeal swab, presence of viral mutation(s) within the areas targeted by this assay, and inadequate number of viral copies (<131 copies/mL). Kathlene Yano negative result must be combined with clinical observations, patient history, and epidemiological information. The expected result is Negative.  Fact Sheet for Patients:  PinkCheek.be  Fact Sheet for Healthcare Providers:  GravelBags.it  This test is no t yet approved or cleared by the [place] FDA and  has been authorized for detection and/or diagnosis of SARS-CoV-2 by FDA under an Emergency Use Authorization (EUA). This EUA will remain  in effect (meaning this test can be used) for the duration of the COVID-19 declaration under Section 564(b)(1) of the Act, 21 U.S.C. section 360bbb-3(b)(1), unless the authorization is terminated or revoked sooner.     Influenza Worthington Cruzan by PCR NEGATIVE NEGATIVE Final   Influenza B by PCR NEGATIVE NEGATIVE Final    Comment: (NOTE) The Xpert Xpress SARS-CoV-2/FLU/RSV assay is intended as an aid in  the diagnosis of influenza from Nasopharyngeal swab specimens and  should not be used as Micayla Brathwaite sole basis for treatment. Nasal washings and  aspirates are unacceptable for Xpert Xpress SARS-CoV-2/FLU/RSV  testing.  Fact Sheet for  Patients: PinkCheek.be  Fact Sheet for Healthcare Providers: GravelBags.it  This test is not yet approved or cleared by the [place] FDA and  has been authorized for detection and/or diagnosis of SARS-CoV-2 by  FDA under an Emergency Use Authorization (EUA). This EUA will remain  in effect (meaning this test can be used) for the duration of the  Covid-19 declaration under Section 564(b)(1) of the Act, 21  U.S.C. section 360bbb-3(b)(1), unless the authorization is  terminated or revoked.    Respiratory Syncytial Virus by PCR NEGATIVE NEGATIVE Final    Comment: (NOTE) Fact Sheet for Patients: PinkCheek.be  Fact Sheet for Healthcare Providers: GravelBags.it  This test is not yet approved or cleared by the [place] FDA and  has been authorized for detection and/or diagnosis of SARS-CoV-2 by  FDA under an Emergency Use Authorization (EUA). This EUA will remain  in effect (meaning this test can be used) for the duration of the  COVID-19 declaration under Section 564(b)(1) of the Act, 21 U.S.C.  section 360bbb-3(b)(1), unless the authorization is terminated or  revoked. Performed at [facility], [place] [address], [place], [place] [address]   MRSA PCR Screening     Status: None   Collection Time: 09/13/20  6:05 PM   Specimen: Nasopharyngeal  Result Value Ref Range Status   MRSA by PCR NEGATIVE NEGATIVE Final    Comment:        The GeneXpert MRSA Assay (FDA approved for NASAL specimens only), is one  component of Creedence Heiss comprehensive MRSA colonization surveillance program. It is not intended to diagnose MRSA infection nor to guide or monitor treatment for MRSA infections. Performed at [facility], [place] [address], [place], [place] [address]   Culture, blood (routine x 2)     Status: None (Preliminary result)   Collection  Time: 09/16/20  5:11 AM   Specimen: BLOOD RIGHT HAND  Result Value Ref Range Status   Specimen Description   Final    BLOOD RIGHT HAND Performed at [place] [address], [place], [place] [address]    Special Requests   Final    BOTTLES DRAWN AEROBIC ONLY Blood Culture adequate volume Performed at [place] [address], [place], [place] [address]    Culture   Final    NO GROWTH 1 DAY Performed at [facility], [place] [address] [place], [place] [address]    Report Status PENDING  Incomplete  Culture, blood (routine x 2)     Status: None (Preliminary result)   Collection Time: 09/16/20  5:11 AM   Specimen: BLOOD LEFT HAND  Result Value Ref Range Status   Specimen Description   Final    BLOOD LEFT HAND Performed at [place] [address], [place], [place] [address]    Special Requests   Final    BOTTLES DRAWN AEROBIC ONLY Blood Culture adequate volume Performed at [place] [address], [place], [place] [address]    Culture   Final    NO GROWTH 1 DAY Performed at [facility], [place] [address], [place], [place] [address]    Report Status PENDING  Incomplete     Labs: Basic Metabolic Panel: Recent Labs  Lab 09/13/20 1201 09/13/20 1205 09/14/20 0413 09/15/20 0425 09/16/20 0511 09/17/20 0435  NA 144  --  144 141 139 138  K 4.2  --  3.9 5.1 3.9 3.8  CL 101  --  107 107 107 102  CO2 29  --  25 24 25 25   GLUCOSE 160*  --  119* 101* 103* 105*  BUN 53*  --  49* 36* 26* 20  CREATININE 1.41*  --  1.14 1.12 1.07 1.33*  CALCIUM 9.1  --  8.6* 8.2* 8.0* 8.1*  MG  --  3.0*  --   --   --  2.3  PHOS  --   --   --   --   --  2.5   Liver Function Tests: Recent Labs  Lab 09/13/20 1201 09/17/20 0435  AST 61* 28  ALT 39 23  ALKPHOS 43 35*  BILITOT 1.3* 0.8  PROT 7.7 5.5*  ALBUMIN 3.1* 2.3*   Recent Labs  Lab 09/13/20 1201  LIPASE 27   No results for input(s):  AMMONIA in the last 168 hours. CBC: Recent Labs  Lab 09/13/20 1052 09/14/20 0413 09/15/20 0425 09/16/20 0511 09/17/20 0435  WBC 18.8* 15.4* 18.7* 19.9* 20.3*  NEUTROABS 15.6*  --   --   --  15.6*  HGB 19.9* 16.5 15.5 15.7 13.7  HCT 62.1* 52.6* 49.8 50.5 43.0  MCV 92.3 92.9 95.8 93.9 93.1  PLT 176 158 131* 109* 143*   Cardiac Enzymes: Recent Labs  Lab 09/13/20 1201 09/15/20 0425 09/16/20 0511 09/17/20 0435  CKTOTAL 1,836* 683* 400* 254   BNP: BNP (last 3 results) No results for input(s): BNP in the last 8760 hours.  ProBNP (last 3 results)  No results for input(s): PROBNP in the last 8760 hours.  CBG: Recent Labs  Lab 09/13/20 1113  GLUCAP 99       Signed:  Fayrene Helper MD.  [facility] 09/17/2020, 2:22 PM

## 2020-09-17 NOTE — Progress Notes (Signed)
  Speech Language Pathology Treatment: Dysphagia  Patient Details Name: Tommy Bauer MRN: [id number] DOB: August 06, 1941 Today's Date: 09/17/2020 Time: 9371-6967 SLP Time Calculation (min) (ACUTE ONLY): 20 min  Assessment / Plan / Recommendation Clinical Impression  Pt with improved swallow function compared to evaluation on 09/13/2020.   Pt today assessed with crackers and water - no indication of aspiration/kpenetration.  3 ounce water test passed easily. Voice is strong and clear and intake has been good.  Pt admits to improved swallow function since admit.  No SLP follow up indicated. Reviewed aspiration precautions with patient.     HPI HPI: 79 yo male adm to [facility] after being found down by his exwife. Pt has h/o CVA, ETOH use, central retina artery occlusion, HLD, HTN , prostate cancer 2011.  Swallow evaluation ordered.  Pt denies dysphagia.  MRI and CT imaging work up negative for stroke.  CXR showed There is stable elevation of the left hemidiaphragm with left base atelectasis.  Pt is undergoing EEG and ECHO as well.  Pt seen for followup to assure po tolerance.  Per NT, pt is eating full meals.      SLP Plan  Continue with current plan of care       Recommendations  Diet recommendations: Regular;Thin liquid Liquids provided via: Straw;Cup Supervision: Patient able to self feed Compensations: Slow rate;Small sips/bites Postural Changes and/or Swallow Maneuvers: Out of bed for meals;Seated upright 90 degrees;Upright 30-60 min after meal                Oral Care Recommendations: Oral care BID Follow up Recommendations: None SLP Visit Diagnosis: Dysphagia, unspecified (R13.10) Plan: Continue with current plan of care       GO                Tommy Bauer 09/17/2020, 11:14 AM  Tommy Lime, MS [facility] SLP Acute Rehab Services Office [phone] Pager [phone]

## 2020-09-17 NOTE — Progress Notes (Signed)
Physical Therapy Treatment Patient Details Name: Tommy Bauer MRN: [id number] DOB: 05-05-1941 Today's Date: 09/17/2020    History of Present Illness Pt is 79 yo male with PMH of prostate CA, CVA, HTN, ETOH abuse, hyperlipidemia, and central retinal artery occlusion.  Pt was found on the floor at home by ex-wife.  Pt admitted with sepsis, L eye conjunctivitis, and mild rhabdomyolysis, and metabolic encephalopathy.    PT Comments    Pt assisted with standing and requiring mod assist (+2) for stability and weakness.  Pt with decreased cognition and poor safety awareness at this time.  RN in room end of session.  Continue to recommend SNF upon d/c.    Follow Up Recommendations  SNF     Equipment Recommendations  Rolling [place] with 5" wheels;3in1 (PT)    Recommendations for Other Services       Precautions / Restrictions Precautions Precautions: Fall    Mobility  Bed Mobility Overal bed mobility: Needs Assistance Bed Mobility: Supine to Sit;Sit to Supine     Supine to sit: Min guard;HOB elevated     General bed mobility comments: increased time and cues, heavy reliance on bed rails  Transfers Overall transfer level: Needs assistance Equipment used: Rolling [place] (2 wheeled) Transfers: Sit to/from [facility] Sit to Stand: Mod assist;+2 physical assistance Stand pivot transfers: Mod assist;+2 physical assistance       General transfer comment: verbal cues for sequence and safety; pt required assist to rise and steady, leaning posteriorly and to right at times; pt still believes he can walk and requesting to get his bag together; pt declined sitting in recliner and assist to sitting on bed; heavy mod assist for weakness and stability  Ambulation/Gait                 Stairs             Wheelchair Mobility    Modified Rankin (Stroke Patients Only)       Balance Overall balance assessment: Needs assistance       Postural control:  Posterior lean;Right lateral lean Standing balance support: Bilateral upper extremity supported Standing balance-Leahy Scale: Zero                              Cognition Arousal/Alertness: Awake/alert Behavior During Therapy: WFL for tasks assessed/performed;Flat affect Overall Cognitive Status: No family/caregiver present to determine baseline cognitive functioning Area of Impairment: Orientation;Problem solving;Attention;Memory;Following commands;Safety/judgement;Awareness                 Orientation Level: Disoriented to;Place;Time;Situation Current Attention Level: Focused Memory: Decreased short-term memory Following Commands: Follows one step commands inconsistently Safety/Judgement: Decreased awareness of safety;Decreased awareness of deficits Awareness: Intellectual Problem Solving: Slow processing;Difficulty sequencing;Requires verbal cues;Requires tactile cues;Decreased initiation General Comments: pt perseverating on if he has his car here; states he came in this morning (not aware he has been here for days); does not recall coming to hospital; believes he is walking out of here and going home      Exercises      General Comments        Pertinent Vitals/Pain Pain Assessment: No/denies pain    Home Living                      Prior Function            PT Goals (current goals can now be found in the care plan  section) Progress towards PT goals: Progressing toward goals    Frequency    Min 2X/week      PT Plan Current plan remains appropriate    Co-evaluation              AM-PAC PT "6 Clicks" Mobility   Outcome Measure  Help needed turning from your back to your side while in a flat bed without using bedrails?: A Lot Help needed moving from lying on your back to sitting on the side of a flat bed without using bedrails?: A Lot Help needed moving to and from a bed to a chair (including a wheelchair)?: A Lot Help needed  standing up from a chair using your arms (e.g., wheelchair or bedside chair)?: A Lot Help needed to walk in hospital room?: A Lot Help needed climbing 3-5 steps with a railing? : Total 6 Click Score: 11    End of Session Equipment Utilized During Treatment: Gait belt Activity Tolerance: Patient tolerated treatment well Patient left: in bed;with call bell/phone within reach;with nursing/sitter in room;with bed alarm set Nurse Communication: Mobility status PT Visit Diagnosis: Unsteadiness on feet (R26.81);Muscle weakness (generalized) (M62.81);History of falling (Z91.81)     Time: 0272-5366 PT Time Calculation (min) (ACUTE ONLY): 19 min  Charges:  $Therapeutic Activity: 8-22 mins                     Jannette Spanner PT, DPT Acute Rehabilitation Services Pager: [phone] Office: [phone]   York Ram E 09/17/2020, 4:02 PM

## 2020-09-17 NOTE — Progress Notes (Signed)
Called report to [place] at [facility].

## 2020-09-17 NOTE — Plan of Care (Signed)
  Problem: Health Behavior/Discharge Planning: Goal: Ability to manage health-related needs will improve Outcome: Adequate for Discharge   Problem: Education: Goal: Knowledge of General Education information will improve Description: Including pain rating scale, medication(s)/side effects and non-pharmacologic comfort measures Outcome: Adequate for Discharge   

## 2020-09-21 LAB — CULTURE, BLOOD (ROUTINE X 2)
Culture: NO GROWTH
Culture: NO GROWTH
Special Requests: ADEQUATE
Special Requests: ADEQUATE

## 2020-09-22 DIAGNOSIS — Z720 Tobacco use: Secondary | ICD-10-CM | POA: Diagnosis not present

## 2020-09-22 DIAGNOSIS — I10 Essential (primary) hypertension: Secondary | ICD-10-CM | POA: Diagnosis not present

## 2020-09-22 DIAGNOSIS — H349 Unspecified retinal vascular occlusion: Secondary | ICD-10-CM | POA: Diagnosis not present

## 2020-09-22 DIAGNOSIS — D696 Thrombocytopenia, unspecified: Secondary | ICD-10-CM | POA: Diagnosis not present

## 2020-09-23 DIAGNOSIS — L853 Xerosis cutis: Secondary | ICD-10-CM | POA: Diagnosis not present

## 2020-09-23 DIAGNOSIS — M6281 Muscle weakness (generalized): Secondary | ICD-10-CM | POA: Diagnosis not present

## 2020-09-23 DIAGNOSIS — W19XXXA Unspecified fall, initial encounter: Secondary | ICD-10-CM | POA: Diagnosis not present

## 2020-09-23 DIAGNOSIS — R296 Repeated falls: Secondary | ICD-10-CM | POA: Diagnosis not present

## 2020-09-24 DIAGNOSIS — L89890 Pressure ulcer of other site, unstageable: Secondary | ICD-10-CM | POA: Diagnosis not present

## 2020-09-28 DIAGNOSIS — R600 Localized edema: Secondary | ICD-10-CM | POA: Diagnosis not present

## 2020-09-28 DIAGNOSIS — R7303 Prediabetes: Secondary | ICD-10-CM | POA: Diagnosis not present

## 2020-09-28 DIAGNOSIS — D72829 Elevated white blood cell count, unspecified: Secondary | ICD-10-CM | POA: Diagnosis not present

## 2020-09-28 DIAGNOSIS — N179 Acute kidney failure, unspecified: Secondary | ICD-10-CM | POA: Diagnosis not present

## 2020-10-01 DIAGNOSIS — L89890 Pressure ulcer of other site, unstageable: Secondary | ICD-10-CM | POA: Diagnosis not present

## 2020-10-01 DIAGNOSIS — L89893 Pressure ulcer of other site, stage 3: Secondary | ICD-10-CM | POA: Diagnosis not present

## 2020-10-02 DIAGNOSIS — R0989 Other specified symptoms and signs involving the circulatory and respiratory systems: Secondary | ICD-10-CM | POA: Diagnosis not present

## 2020-10-02 DIAGNOSIS — J918 Pleural effusion in other conditions classified elsewhere: Secondary | ICD-10-CM | POA: Diagnosis not present

## 2020-10-02 DIAGNOSIS — R54 Age-related physical debility: Secondary | ICD-10-CM | POA: Diagnosis not present

## 2020-10-02 DIAGNOSIS — R627 Adult failure to thrive: Secondary | ICD-10-CM | POA: Diagnosis not present

## 2020-10-02 DIAGNOSIS — R918 Other nonspecific abnormal finding of lung field: Secondary | ICD-10-CM | POA: Diagnosis not present

## 2020-10-02 DIAGNOSIS — J9811 Atelectasis: Secondary | ICD-10-CM | POA: Diagnosis not present

## 2020-10-02 DIAGNOSIS — M6281 Muscle weakness (generalized): Secondary | ICD-10-CM | POA: Diagnosis not present

## 2020-10-02 DIAGNOSIS — R600 Localized edema: Secondary | ICD-10-CM | POA: Diagnosis not present

## 2020-10-08 DIAGNOSIS — L89893 Pressure ulcer of other site, stage 3: Secondary | ICD-10-CM | POA: Diagnosis not present

## 2020-10-08 DIAGNOSIS — L89890 Pressure ulcer of other site, unstageable: Secondary | ICD-10-CM | POA: Diagnosis not present

## 2020-10-14 DIAGNOSIS — R600 Localized edema: Secondary | ICD-10-CM | POA: Diagnosis not present

## 2020-10-14 DIAGNOSIS — F0390 Unspecified dementia without behavioral disturbance: Secondary | ICD-10-CM | POA: Diagnosis not present

## 2020-10-14 DIAGNOSIS — M6281 Muscle weakness (generalized): Secondary | ICD-10-CM | POA: Diagnosis not present

## 2020-10-20 DIAGNOSIS — L03115 Cellulitis of right lower limb: Secondary | ICD-10-CM | POA: Diagnosis not present

## 2020-10-20 DIAGNOSIS — S71001D Unspecified open wound, right hip, subsequent encounter: Secondary | ICD-10-CM | POA: Diagnosis not present

## 2020-10-20 DIAGNOSIS — F0390 Unspecified dementia without behavioral disturbance: Secondary | ICD-10-CM | POA: Diagnosis not present

## 2020-10-20 DIAGNOSIS — R600 Localized edema: Secondary | ICD-10-CM | POA: Diagnosis not present

## 2020-10-31 DIAGNOSIS — H109 Unspecified conjunctivitis: Secondary | ICD-10-CM | POA: Diagnosis not present

## 2020-10-31 DIAGNOSIS — L853 Xerosis cutis: Secondary | ICD-10-CM | POA: Diagnosis not present

## 2020-10-31 DIAGNOSIS — T68XXXD Hypothermia, subsequent encounter: Secondary | ICD-10-CM | POA: Diagnosis not present

## 2020-10-31 DIAGNOSIS — Z8673 Personal history of transient ischemic attack (TIA), and cerebral infarction without residual deficits: Secondary | ICD-10-CM | POA: Diagnosis not present

## 2020-10-31 DIAGNOSIS — Z720 Tobacco use: Secondary | ICD-10-CM | POA: Diagnosis not present

## 2020-10-31 DIAGNOSIS — M6281 Muscle weakness (generalized): Secondary | ICD-10-CM | POA: Diagnosis not present

## 2020-10-31 DIAGNOSIS — R296 Repeated falls: Secondary | ICD-10-CM | POA: Diagnosis not present

## 2020-10-31 DIAGNOSIS — Z8546 Personal history of malignant neoplasm of prostate: Secondary | ICD-10-CM | POA: Diagnosis not present

## 2020-10-31 DIAGNOSIS — R2689 Other abnormalities of gait and mobility: Secondary | ICD-10-CM | POA: Diagnosis not present

## 2020-10-31 DIAGNOSIS — Z8616 Personal history of COVID-19: Secondary | ICD-10-CM | POA: Diagnosis not present

## 2020-10-31 DIAGNOSIS — I10 Essential (primary) hypertension: Secondary | ICD-10-CM | POA: Diagnosis not present

## 2020-10-31 DIAGNOSIS — L89893 Pressure ulcer of other site, stage 3: Secondary | ICD-10-CM | POA: Diagnosis not present

## 2020-10-31 DIAGNOSIS — E785 Hyperlipidemia, unspecified: Secondary | ICD-10-CM | POA: Diagnosis not present

## 2020-10-31 DIAGNOSIS — U071 COVID-19: Secondary | ICD-10-CM | POA: Diagnosis not present

## 2020-10-31 DIAGNOSIS — R600 Localized edema: Secondary | ICD-10-CM | POA: Diagnosis not present

## 2020-10-31 DIAGNOSIS — R7303 Prediabetes: Secondary | ICD-10-CM | POA: Diagnosis not present

## 2020-10-31 DIAGNOSIS — L89890 Pressure ulcer of other site, unstageable: Secondary | ICD-10-CM | POA: Diagnosis not present

## 2020-10-31 DIAGNOSIS — H3410 Central retinal artery occlusion, unspecified eye: Secondary | ICD-10-CM | POA: Diagnosis not present

## 2020-10-31 DIAGNOSIS — N179 Acute kidney failure, unspecified: Secondary | ICD-10-CM | POA: Diagnosis not present

## 2020-10-31 DIAGNOSIS — R4182 Altered mental status, unspecified: Secondary | ICD-10-CM | POA: Diagnosis not present

## 2020-11-03 DIAGNOSIS — I10 Essential (primary) hypertension: Secondary | ICD-10-CM | POA: Diagnosis not present

## 2020-11-03 DIAGNOSIS — U071 COVID-19: Secondary | ICD-10-CM | POA: Diagnosis not present

## 2020-11-03 DIAGNOSIS — I6930 Unspecified sequelae of cerebral infarction: Secondary | ICD-10-CM | POA: Diagnosis not present

## 2020-11-03 DIAGNOSIS — F0390 Unspecified dementia without behavioral disturbance: Secondary | ICD-10-CM | POA: Diagnosis not present

## 2020-11-04 DIAGNOSIS — F0390 Unspecified dementia without behavioral disturbance: Secondary | ICD-10-CM | POA: Diagnosis not present

## 2020-11-04 DIAGNOSIS — I10 Essential (primary) hypertension: Secondary | ICD-10-CM | POA: Diagnosis not present

## 2020-11-04 DIAGNOSIS — U071 COVID-19: Secondary | ICD-10-CM | POA: Diagnosis not present

## 2020-11-13 DIAGNOSIS — L03213 Periorbital cellulitis: Secondary | ICD-10-CM | POA: Diagnosis not present

## 2020-11-13 DIAGNOSIS — U071 COVID-19: Secondary | ICD-10-CM | POA: Diagnosis not present

## 2020-11-13 DIAGNOSIS — F0390 Unspecified dementia without behavioral disturbance: Secondary | ICD-10-CM | POA: Diagnosis not present

## 2020-11-19 DIAGNOSIS — L03213 Periorbital cellulitis: Secondary | ICD-10-CM | POA: Diagnosis not present

## 2020-11-19 DIAGNOSIS — F0390 Unspecified dementia without behavioral disturbance: Secondary | ICD-10-CM | POA: Diagnosis not present

## 2020-11-19 DIAGNOSIS — I6930 Unspecified sequelae of cerebral infarction: Secondary | ICD-10-CM | POA: Diagnosis not present

## 2020-11-19 DIAGNOSIS — Z8616 Personal history of COVID-19: Secondary | ICD-10-CM | POA: Diagnosis not present

## 2020-11-19 DIAGNOSIS — I10 Essential (primary) hypertension: Secondary | ICD-10-CM | POA: Diagnosis not present

## 2020-11-26 DIAGNOSIS — L89893 Pressure ulcer of other site, stage 3: Secondary | ICD-10-CM | POA: Diagnosis not present

## 2020-11-26 DIAGNOSIS — W19XXXA Unspecified fall, initial encounter: Secondary | ICD-10-CM | POA: Diagnosis not present

## 2020-11-26 DIAGNOSIS — L03213 Periorbital cellulitis: Secondary | ICD-10-CM | POA: Diagnosis not present

## 2020-11-26 DIAGNOSIS — M6281 Muscle weakness (generalized): Secondary | ICD-10-CM | POA: Diagnosis not present

## 2020-11-26 DIAGNOSIS — F0390 Unspecified dementia without behavioral disturbance: Secondary | ICD-10-CM | POA: Diagnosis not present

## 2020-12-03 DIAGNOSIS — L89893 Pressure ulcer of other site, stage 3: Secondary | ICD-10-CM | POA: Diagnosis not present

## 2020-12-08 ENCOUNTER — Telehealth: Payer: Self-pay | Admitting: Adult Health

## 2020-12-08 NOTE — Telephone Encounter (Signed)
Tried to call patient to  schedule Medicare Annual Wellness Visit (AWV) either virtually or in office.  No answer   Last AWV  01/09/18 please schedule at anytime with [facility] Nurse Health Advisor 1 or 2   This should be a 45 minute visit.

## 2020-12-10 DIAGNOSIS — L89893 Pressure ulcer of other site, stage 3: Secondary | ICD-10-CM | POA: Diagnosis not present

## 2020-12-14 ENCOUNTER — Encounter: Payer: Self-pay | Admitting: Neurology

## 2020-12-14 ENCOUNTER — Ambulatory Visit (INDEPENDENT_AMBULATORY_CARE_PROVIDER_SITE_OTHER): Payer: Medicare Other | Admitting: Neurology

## 2020-12-14 ENCOUNTER — Other Ambulatory Visit: Payer: Self-pay

## 2020-12-14 VITALS — BP 139/78 | HR 79 | Ht 72.0 in | Wt 210.0 lb

## 2020-12-14 DIAGNOSIS — R9401 Abnormal electroencephalogram [EEG]: Secondary | ICD-10-CM | POA: Diagnosis not present

## 2020-12-14 DIAGNOSIS — R269 Unspecified abnormalities of gait and mobility: Secondary | ICD-10-CM | POA: Diagnosis not present

## 2020-12-14 DIAGNOSIS — R413 Other amnesia: Secondary | ICD-10-CM | POA: Diagnosis not present

## 2020-12-14 DIAGNOSIS — F0390 Unspecified dementia without behavioral disturbance: Secondary | ICD-10-CM | POA: Insufficient documentation

## 2020-12-14 NOTE — Progress Notes (Signed)
Chief Complaint  Patient presents with   New Patient (Initial Visit)    HISTORICAL  Tommy Bauer is a 80 year old male, seen in request by her primary care nurse practitioner [name] on September 17, 2020, following his hospital discharge  I reviewed and summarized the referring note.  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, he still drives, but lost his car few years ago, was not able to afford to a new car, used to drink heavily, to the point of drunk, quit drinking around 2020,  He was admitted to hospital from November 14-18, 2021 after his ex-wife found him on the floor, hypothermia, encephalopathic, leukocytosis, left eye conjunctivitis, mild rhabdomyolysis, acute metabolic encephalopathy, likely due to dehydration, active infection, urinalysis showed evidence of protein, chest x-ray showed stable elevation of left hemidiaphragm with left basilar atelectasis, he was treated with broad-spectrum antibiotics, he is symptoms gradually improved, culture was negative, urine culture showed less than 10,000 colonies.  EEG showed intermittent slowing, left more than right temporal region, no epileptiform discharge  Echocardiogram ejection fraction 55 to 60%, no acute abnormality. I personally reviewed MRI of the brain without contrast in November 2021: Generalized atrophy, moderate small vessel disease Laboratory values in 2021, CPK was 254 on September 17, 2020, was 1836 on November 14, CMP showed mild elevated creatinine 1.3, decreased calcium 8.1, CBC elevated WBC of 20.3 elevated troponin of 45, alcohol less than 10, normal TSH  REVIEW OF SYSTEMS: Full 14 system review of systems performed and notable only for as above All other review of systems were negative.  ALLERGIES: No Known Allergies  HOME MEDICATIONS: Current Outpatient  Medications  Medication Sig Dispense Refill   acetaminophen (TYLENOL) 500 MG tablet Take 500 mg by mouth every 4 (four) hours as needed for moderate pain.      aspirin 81 MG tablet Take 1 tablet (81 mg total) by mouth daily. 30 tablet    atorvastatin (LIPITOR) 40 MG tablet Take 1 tablet (40 mg total) by mouth daily. 90 tablet 1   bimatoprost (LUMIGAN) 0.03 % ophthalmic solution Place 1 drop into both eyes at bedtime.     folic acid (FOLVITE) 1 MG tablet Take 1 mg by mouth daily.     furosemide (LASIX) 40 MG tablet Take 40 mg by mouth.     metoprolol succinate (TOPROL-XL) 100 MG 24 hr tablet TAKE ONE TABLET BY MOUTH ONCE DAILY TAKE  WITH  OR  IMMEDIATELY  FOLLOWING  A  MEAL (Patient taking differently: Take 100 mg by mouth daily.) 90 tablet 3   Multiple Vitamin (MULTIVITAMIN) tablet Take 1 tablet by mouth daily.     potassium chloride (KLOR-CON) 20 MEQ packet Take 40 mEq by mouth 2 (two) times daily. When taking lasix     thiamine 100 MG tablet Take 100 mg by mouth daily.     triamcinolone 0.025%-ammonium lactate 12% 1:1 cream mixture Apply topically.     No current facility-administered medications for this visit.    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   Headache(784.0) 07/11/2007   HYPERTENSION  06/08/2007   INGUINAL HERNIA 12/04/2007   LEFT VENTRICULAR FUNCTION, DECREASED 11/12/2007   Prostate cancer ([place]) 09/14/2009   prostateectomy,adenocarcinoma,gleason=4+3=7   PROSTATE SPECIFIC ANTIGEN, ELEVATED 05/06/2009   TOBACCO ABUSE 11/06/2009    PAST SURGICAL HISTORY: Past Surgical History:  Procedure Laterality Date   HERNIA REPAIR     right ingunial    PROSTATE SURGERY  09/14/2009   radical prostatectomy /gleason=4+3-7    FAMILY HISTORY: Family History  Problem Relation Age of Onset   Colon cancer Neg Hx     SOCIAL HISTORY: Social History   Socioeconomic History   Marital status: Married    Spouse name: Tommy Bauer   Number of children: Not on file   Years of education: Not on file   Highest education level: Not on file  Occupational History    Employer: RETIRED    Comment: retired Games developer  Tobacco Use   Smoking status: Former Smoker    Years: 60.00    Types: Cigarettes   Smokeless tobacco: Never Used  Substance and Sexual Activity   Alcohol use: Not Currently   Drug use: No   Sexual activity: Not on file  Other Topics Concern   Not on file  Social History Narrative   Retired Games developer    Married for 54 years but legally separated.    [facility]   Right Handed   Cannot read   Drinks no caffeine   Social Determinants of Health   Financial Resource Strain: Not on file  Food Insecurity: Not on file  Transportation Needs: Not on file  Physical Activity: Not on file  Stress: Not on file  Social Connections: Not on file  Intimate Partner Violence: Not on file     PHYSICAL EXAM   Vitals:   12/14/20 1437  BP: 139/78  Pulse: 79  Weight: 210 lb (95.3 kg)  Height: 6' (1.829 m)   Not recorded     Body mass index is 28.48 kg/m.  PHYSICAL EXAMNIATION:  Gen: NAD, conversant, well nourised, well groomed                     Cardiovascular: Regular rate rhythm, no peripheral edema, warm, nontender. Eyes: Conjunctivae clear without exudates or hemorrhage Neck: Supple, no carotid bruits. Pulmonary: Clear to auscultation bilaterally   NEUROLOGICAL EXAM:  MMSE - Mini Mental State Exam 12/14/2020  Orientation to time 1  Orientation to Place 2  Registration 3  Attention/ Calculation 0  Recall 0  Language- name 2 objects 2  Language- repeat 1  Language- follow 3 step  command 3  Language- read & follow direction 0  Write a sentence 0  Copy design 0  Total score 12      CRANIAL NERVES: CN II: Visual fields are full to confrontation. Pupils are round equal and briskly reactive to light. CN III, IV, VI: extraocular movement are normal. No ptosis. CN V: Facial sensation is intact to light touch CN VII: Face is symmetric with normal eye closure  CN VIII: Hearing is normal to causal conversation. CN IX, X: Phonation is normal. CN XI: Head turning and shoulder shrug are intact  MOTOR: There is no pronator drift of out-stretched arms. Muscle bulk and tone are normal. Muscle strength is normal.  REFLEXES: Reflexes are 2+ and symmetric at the biceps, triceps, knees, and ankles. Plantar responses are flexor.  SENSORY: Intact to light touch, pinprick and vibratory sensation are intact in fingers and toes.  COORDINATION:  There is no trunk or limb dysmetria noted.  GAIT/STANCE: Need push-up to get up from seated position, antalgic   DIAGNOSTIC DATA (LABS, IMAGING, TESTING) - I reviewed patient records, labs, notes, testing and imaging myself where available.   ASSESSMENT AND PLAN  Tommy Bauer is a 80 y.o. male   Confusion, memory loss, Cerebrovascular disease  Mini-Mental Status Examination is only 12 out of 30 today,  Central nervous system degenerative disorder, with a vascular component, hospital admission due to a increased confusion from active infection, sepsis  MRI of the brain November 2021 showed brain atrophy, supratentorium small vessel disease,  Laboratory evaluation to rule out treatable etiology  Repeat EEG  Ultrasound of carotid artery  Marcial Pacas, M.D. Ph.D.  [facility] [address] [place], [place] [address] Ph: [phone] Fax: [phone]  CC:  Elodia Florence., MD [address] [place],  [place] [address]

## 2020-12-15 ENCOUNTER — Telehealth: Payer: Self-pay | Admitting: Neurology

## 2020-12-15 LAB — CBC WITH DIFFERENTIAL
Basophils Absolute: 0 10*3/uL (ref 0.0–0.2)
Basos: 1 %
EOS (ABSOLUTE): 0.5 10*3/uL — ABNORMAL HIGH (ref 0.0–0.4)
Eos: 8 %
Hematocrit: 39.7 % (ref 37.5–51.0)
Hemoglobin: 12.2 g/dL — ABNORMAL LOW (ref 13.0–17.7)
Immature Grans (Abs): 0 10*3/uL (ref 0.0–0.1)
Immature Granulocytes: 0 %
Lymphocytes Absolute: 1.2 10*3/uL (ref 0.7–3.1)
Lymphs: 18 %
MCH: 26.6 pg (ref 26.6–33.0)
MCHC: 30.7 g/dL — ABNORMAL LOW (ref 31.5–35.7)
MCV: 87 fL (ref 79–97)
Monocytes Absolute: 0.7 10*3/uL (ref 0.1–0.9)
Monocytes: 11 %
Neutrophils Absolute: 3.9 10*3/uL (ref 1.4–7.0)
Neutrophils: 62 %
RBC: 4.58 x10E6/uL (ref 4.14–5.80)
RDW: 15.6 % — ABNORMAL HIGH (ref 11.6–15.4)
WBC: 6.3 10*3/uL (ref 3.4–10.8)

## 2020-12-15 LAB — COMPREHENSIVE METABOLIC PANEL
ALT: 14 IU/L (ref 0–44)
AST: 16 IU/L (ref 0–40)
Albumin/Globulin Ratio: 0.9 — ABNORMAL LOW (ref 1.2–2.2)
Albumin: 3.9 g/dL (ref 3.7–4.7)
Alkaline Phosphatase: 86 IU/L (ref 44–121)
BUN/Creatinine Ratio: 11 (ref 10–24)
BUN: 13 mg/dL (ref 8–27)
Bilirubin Total: 0.5 mg/dL (ref 0.0–1.2)
CO2: 24 mmol/L (ref 20–29)
Calcium: 9.3 mg/dL (ref 8.6–10.2)
Chloride: 97 mmol/L (ref 96–106)
Creatinine, Ser: 1.16 mg/dL (ref 0.76–1.27)
GFR calc Af Amer: 69 mL/min/{1.73_m2} (ref >59)
GFR calc non Af Amer: 60 mL/min/{1.73_m2} (ref >59)
Globulin, Total: 4.2 g/dL (ref 1.5–4.5)
Glucose: 91 mg/dL (ref 65–99)
Potassium: 4.2 mmol/L (ref 3.5–5.2)
Sodium: 139 mmol/L (ref 134–144)
Total Protein: 8.1 g/dL (ref 6.0–8.5)

## 2020-12-15 LAB — RPR: RPR Ser Ql: NONREACTIVE

## 2020-12-15 LAB — HIV ANTIBODY (ROUTINE TESTING W REFLEX): HIV Screen 4th Generation wRfx: NONREACTIVE

## 2020-12-15 LAB — CK: Total CK: 109 U/L (ref 41–331)

## 2020-12-15 LAB — VITAMIN B12: Vitamin B-12: 402 pg/mL (ref 232–1245)

## 2020-12-15 NOTE — Telephone Encounter (Signed)
Please call patient, laboratory evaluation showed mildly decreased hemoglobin 12.2, which is lower than his previous baseline of 13.7, have forwarded laboratory evaluation to his primary care physician Dr. Carlisle Cater, Tommi Rumps, NP, may continue follow-up with her at his next scheduled follow-up   Rest of the laboratory evaluation showed no significant abnormalities.

## 2020-12-15 NOTE — Telephone Encounter (Addendum)
Tried calling [phone]. Phone continued to ring. Called daughter (on [place]). Relayed results per Dr. Krista Blue note. She verbalized understanding. She will follow up with PCP. Pt currently at [facility] [address] [place], [place] [address]. Phone [phone]. Unsure how long he will be there. She will call back if she has any more questions/concerns.

## 2020-12-17 DIAGNOSIS — L89893 Pressure ulcer of other site, stage 3: Secondary | ICD-10-CM | POA: Diagnosis not present

## 2020-12-18 ENCOUNTER — Encounter: Payer: Self-pay | Admitting: Neurology

## 2020-12-30 DIAGNOSIS — N390 Urinary tract infection, site not specified: Secondary | ICD-10-CM | POA: Diagnosis not present

## 2020-12-30 DIAGNOSIS — E785 Hyperlipidemia, unspecified: Secondary | ICD-10-CM | POA: Diagnosis not present

## 2020-12-30 DIAGNOSIS — Z139 Encounter for screening, unspecified: Secondary | ICD-10-CM | POA: Diagnosis not present

## 2020-12-30 DIAGNOSIS — R4182 Altered mental status, unspecified: Secondary | ICD-10-CM | POA: Diagnosis not present

## 2020-12-30 DIAGNOSIS — U071 COVID-19: Secondary | ICD-10-CM | POA: Diagnosis not present

## 2020-12-30 DIAGNOSIS — I10 Essential (primary) hypertension: Secondary | ICD-10-CM | POA: Diagnosis not present

## 2021-01-06 ENCOUNTER — Ambulatory Visit (INDEPENDENT_AMBULATORY_CARE_PROVIDER_SITE_OTHER): Payer: Medicare Other | Admitting: Neurology

## 2021-01-06 ENCOUNTER — Other Ambulatory Visit: Payer: Self-pay

## 2021-01-06 DIAGNOSIS — R410 Disorientation, unspecified: Secondary | ICD-10-CM | POA: Diagnosis not present

## 2021-01-06 DIAGNOSIS — R9401 Abnormal electroencephalogram [EEG]: Secondary | ICD-10-CM

## 2021-01-06 DIAGNOSIS — R269 Unspecified abnormalities of gait and mobility: Secondary | ICD-10-CM

## 2021-01-06 DIAGNOSIS — R413 Other amnesia: Secondary | ICD-10-CM

## 2021-01-11 DIAGNOSIS — L84 Corns and callosities: Secondary | ICD-10-CM | POA: Diagnosis not present

## 2021-01-11 DIAGNOSIS — I739 Peripheral vascular disease, unspecified: Secondary | ICD-10-CM | POA: Diagnosis not present

## 2021-01-11 DIAGNOSIS — M21612 Bunion of left foot: Secondary | ICD-10-CM | POA: Diagnosis not present

## 2021-01-11 DIAGNOSIS — L602 Onychogryphosis: Secondary | ICD-10-CM | POA: Diagnosis not present

## 2021-01-12 DIAGNOSIS — S0100XD Unspecified open wound of scalp, subsequent encounter: Secondary | ICD-10-CM | POA: Diagnosis not present

## 2021-01-12 DIAGNOSIS — F0390 Unspecified dementia without behavioral disturbance: Secondary | ICD-10-CM | POA: Diagnosis not present

## 2021-01-13 ENCOUNTER — Other Ambulatory Visit: Payer: Self-pay | Admitting: Internal Medicine

## 2021-01-13 DIAGNOSIS — E785 Hyperlipidemia, unspecified: Secondary | ICD-10-CM | POA: Diagnosis not present

## 2021-01-13 DIAGNOSIS — I10 Essential (primary) hypertension: Secondary | ICD-10-CM | POA: Diagnosis not present

## 2021-01-13 DIAGNOSIS — Z139 Encounter for screening, unspecified: Secondary | ICD-10-CM | POA: Diagnosis not present

## 2021-01-13 DIAGNOSIS — R4182 Altered mental status, unspecified: Secondary | ICD-10-CM | POA: Diagnosis not present

## 2021-01-13 DIAGNOSIS — N390 Urinary tract infection, site not specified: Secondary | ICD-10-CM | POA: Diagnosis not present

## 2021-01-13 DIAGNOSIS — U071 COVID-19: Secondary | ICD-10-CM | POA: Diagnosis not present

## 2021-01-13 DIAGNOSIS — M869 Osteomyelitis, unspecified: Secondary | ICD-10-CM

## 2021-01-15 ENCOUNTER — Inpatient Hospital Stay: Admission: RE | Admit: 2021-01-15 | Payer: Medicare Other | Source: Ambulatory Visit

## 2021-01-15 NOTE — Procedures (Signed)
   HISTORY: 80 year old male, presented with confusion, unresponsive  TECHNIQUE:  This is a routine 16 channel EEG recording with one channel devoted to a limited EKG recording.  It was performed during wakefulness, drowsiness and asleep.  Hyperventilation and photic stimulation were performed as activating procedures.  There are minimum muscle and movement artifact noted.  Upon maximum arousal, posterior dominant waking rhythm consistent of mild dysrhythmic alpha range activity, symmetric, reactive to eye opening and closing. Hyperventilation produced mild/moderate buildup with higher amplitude and the slower activities noted.  Photic stimulation did not alter the tracing.  During EEG recording, patient developed drowsiness and no deeper stage of sleep was achieved  During EEG recording, there was no epileptiform discharge noted.  EKG demonstrate sinus rhythm, with heart rate of 80 bpm  CONCLUSION: This is a  normal awake EEG.  There is no electrodiagnostic evidence of epileptiform discharge.  Marcial Pacas, M.D. Ph.D.  [facility] [place], [place] [address] Phone: [phone] Fax:      [phone]

## 2021-01-18 ENCOUNTER — Ambulatory Visit
Admission: RE | Admit: 2021-01-18 | Discharge: 2021-01-18 | Disposition: A | Discharge status: Home / Self Care | Payer: Medicare Other | Source: Ambulatory Visit | Attending: Internal Medicine | Admitting: Internal Medicine

## 2021-01-18 DIAGNOSIS — R410 Disorientation, unspecified: Secondary | ICD-10-CM | POA: Diagnosis not present

## 2021-01-18 DIAGNOSIS — M869 Osteomyelitis, unspecified: Secondary | ICD-10-CM

## 2021-01-18 DIAGNOSIS — R220 Localized swelling, mass and lump, head: Secondary | ICD-10-CM | POA: Diagnosis not present

## 2021-01-18 DIAGNOSIS — H748X1 Other specified disorders of right middle ear and mastoid: Secondary | ICD-10-CM | POA: Diagnosis not present

## 2021-01-18 DIAGNOSIS — I2510 Atherosclerotic heart disease of native coronary artery without angina pectoris: Secondary | ICD-10-CM | POA: Diagnosis not present

## 2021-01-18 MED ORDER — IOPAMIDOL (ISOVUE-300) INJECTION 61%
75.0000 mL | Freq: Once | INTRAVENOUS | Status: AC | PRN
  Administered 2021-01-18: 75 mL via INTRAVENOUS

## 2021-01-27 DIAGNOSIS — H04123 Dry eye syndrome of bilateral lacrimal glands: Secondary | ICD-10-CM | POA: Diagnosis not present

## 2021-01-27 DIAGNOSIS — H524 Presbyopia: Secondary | ICD-10-CM | POA: Diagnosis not present

## 2021-01-27 DIAGNOSIS — H2513 Age-related nuclear cataract, bilateral: Secondary | ICD-10-CM | POA: Diagnosis not present

## 2021-01-27 DIAGNOSIS — R7303 Prediabetes: Secondary | ICD-10-CM | POA: Diagnosis not present

## 2021-01-31 DIAGNOSIS — Z8673 Personal history of transient ischemic attack (TIA), and cerebral infarction without residual deficits: Secondary | ICD-10-CM | POA: Diagnosis not present

## 2021-01-31 DIAGNOSIS — E785 Hyperlipidemia, unspecified: Secondary | ICD-10-CM | POA: Diagnosis not present

## 2021-01-31 DIAGNOSIS — Z8546 Personal history of malignant neoplasm of prostate: Secondary | ICD-10-CM | POA: Diagnosis not present

## 2021-01-31 DIAGNOSIS — M6281 Muscle weakness (generalized): Secondary | ICD-10-CM | POA: Diagnosis not present

## 2021-01-31 DIAGNOSIS — R296 Repeated falls: Secondary | ICD-10-CM | POA: Diagnosis not present

## 2021-01-31 DIAGNOSIS — L853 Xerosis cutis: Secondary | ICD-10-CM | POA: Diagnosis not present

## 2021-01-31 DIAGNOSIS — I10 Essential (primary) hypertension: Secondary | ICD-10-CM | POA: Diagnosis not present

## 2021-01-31 DIAGNOSIS — R7303 Prediabetes: Secondary | ICD-10-CM | POA: Diagnosis not present

## 2021-02-01 ENCOUNTER — Emergency Department (HOSPITAL_COMMUNITY): Payer: Medicare Other

## 2021-02-01 ENCOUNTER — Inpatient Hospital Stay (HOSPITAL_COMMUNITY)
Admission: EM | Admit: 2021-02-01 | Discharge: 2021-02-07 | DRG: 603 | Disposition: A | Discharge status: Skilled Nursing Facility | Payer: Medicare Other | Attending: Family Medicine | Admitting: Family Medicine

## 2021-02-01 DIAGNOSIS — D649 Anemia, unspecified: Secondary | ICD-10-CM | POA: Diagnosis present

## 2021-02-01 DIAGNOSIS — T148XXA Other injury of unspecified body region, initial encounter: Secondary | ICD-10-CM | POA: Diagnosis not present

## 2021-02-01 DIAGNOSIS — R739 Hyperglycemia, unspecified: Secondary | ICD-10-CM | POA: Diagnosis present

## 2021-02-01 DIAGNOSIS — Z86010 Personal history of colonic polyps: Secondary | ICD-10-CM

## 2021-02-01 DIAGNOSIS — L03811 Cellulitis of head [any part, except face]: Secondary | ICD-10-CM

## 2021-02-01 DIAGNOSIS — M869 Osteomyelitis, unspecified: Secondary | ICD-10-CM

## 2021-02-01 DIAGNOSIS — R001 Bradycardia, unspecified: Secondary | ICD-10-CM | POA: Diagnosis not present

## 2021-02-01 DIAGNOSIS — L089 Local infection of the skin and subcutaneous tissue, unspecified: Secondary | ICD-10-CM | POA: Diagnosis not present

## 2021-02-01 DIAGNOSIS — Z0389 Encounter for observation for other suspected diseases and conditions ruled out: Secondary | ICD-10-CM | POA: Diagnosis not present

## 2021-02-01 DIAGNOSIS — Z8546 Personal history of malignant neoplasm of prostate: Secondary | ICD-10-CM

## 2021-02-01 DIAGNOSIS — R0682 Tachypnea, not elsewhere classified: Secondary | ICD-10-CM | POA: Diagnosis present

## 2021-02-01 DIAGNOSIS — R9431 Abnormal electrocardiogram [ECG] [EKG]: Secondary | ICD-10-CM

## 2021-02-01 DIAGNOSIS — Z20822 Contact with and (suspected) exposure to covid-19: Secondary | ICD-10-CM | POA: Diagnosis present

## 2021-02-01 DIAGNOSIS — R4182 Altered mental status, unspecified: Secondary | ICD-10-CM | POA: Diagnosis present

## 2021-02-01 DIAGNOSIS — L02811 Cutaneous abscess of head [any part, except face]: Secondary | ICD-10-CM | POA: Diagnosis not present

## 2021-02-01 DIAGNOSIS — I6782 Cerebral ischemia: Secondary | ICD-10-CM | POA: Diagnosis not present

## 2021-02-01 DIAGNOSIS — Z79899 Other long term (current) drug therapy: Secondary | ICD-10-CM

## 2021-02-01 DIAGNOSIS — Z87891 Personal history of nicotine dependence: Secondary | ICD-10-CM

## 2021-02-01 DIAGNOSIS — G4734 Idiopathic sleep related nonobstructive alveolar hypoventilation: Secondary | ICD-10-CM

## 2021-02-01 DIAGNOSIS — M7989 Other specified soft tissue disorders: Secondary | ICD-10-CM | POA: Diagnosis not present

## 2021-02-01 DIAGNOSIS — I10 Essential (primary) hypertension: Secondary | ICD-10-CM | POA: Diagnosis present

## 2021-02-01 DIAGNOSIS — I441 Atrioventricular block, second degree: Secondary | ICD-10-CM

## 2021-02-01 DIAGNOSIS — W19XXXA Unspecified fall, initial encounter: Secondary | ICD-10-CM | POA: Diagnosis not present

## 2021-02-01 DIAGNOSIS — Z8673 Personal history of transient ischemic attack (TIA), and cerebral infarction without residual deficits: Secondary | ICD-10-CM

## 2021-02-01 DIAGNOSIS — M8618 Other acute osteomyelitis, other site: Secondary | ICD-10-CM

## 2021-02-01 DIAGNOSIS — E785 Hyperlipidemia, unspecified: Secondary | ICD-10-CM

## 2021-02-01 DIAGNOSIS — Z7982 Long term (current) use of aspirin: Secondary | ICD-10-CM

## 2021-02-01 DIAGNOSIS — R404 Transient alteration of awareness: Secondary | ICD-10-CM | POA: Diagnosis not present

## 2021-02-01 DIAGNOSIS — S0100XA Unspecified open wound of scalp, initial encounter: Secondary | ICD-10-CM | POA: Diagnosis present

## 2021-02-01 LAB — CBC WITH DIFFERENTIAL/PLATELET
Abs Immature Granulocytes: 0.02 10*3/uL (ref 0.00–0.07)
Basophils Absolute: 0 10*3/uL (ref 0.0–0.1)
Basophils Relative: 1 %
Eosinophils Absolute: 0.4 10*3/uL (ref 0.0–0.5)
Eosinophils Relative: 7 %
HCT: 42.2 % (ref 39.0–52.0)
Hemoglobin: 12.8 g/dL — ABNORMAL LOW (ref 13.0–17.0)
Immature Granulocytes: 0 %
Lymphocytes Relative: 24 %
Lymphs Abs: 1.4 10*3/uL (ref 0.7–4.0)
MCH: 27.5 pg (ref 26.0–34.0)
MCHC: 30.3 g/dL (ref 30.0–36.0)
MCV: 90.8 fL (ref 80.0–100.0)
Monocytes Absolute: 0.6 10*3/uL (ref 0.1–1.0)
Monocytes Relative: 10 %
Neutro Abs: 3.6 10*3/uL (ref 1.7–7.7)
Neutrophils Relative %: 58 %
Platelets: 284 10*3/uL (ref 150–400)
RBC: 4.65 MIL/uL (ref 4.22–5.81)
RDW: 16.3 % — ABNORMAL HIGH (ref 11.5–15.5)
WBC: 6.1 10*3/uL (ref 4.0–10.5)
nRBC: 0 % (ref 0.0–0.2)

## 2021-02-01 LAB — BASIC METABOLIC PANEL
Anion gap: 5 (ref 5–15)
BUN: 20 mg/dL (ref 8–23)
CO2: 28 mmol/L (ref 22–32)
Calcium: 9.3 mg/dL (ref 8.9–10.3)
Chloride: 104 mmol/L (ref 98–111)
Creatinine, Ser: 1.03 mg/dL (ref 0.61–1.24)
GFR, Estimated: >60 mL/min (ref >60)
Glucose, Bld: 111 mg/dL — ABNORMAL HIGH (ref 70–99)
Potassium: 4 mmol/L (ref 3.5–5.1)
Sodium: 137 mmol/L (ref 135–145)

## 2021-02-01 LAB — LACTIC ACID, PLASMA: Lactic Acid, Venous: 1.1 mmol/L (ref 0.5–1.9)

## 2021-02-01 MED ORDER — IOHEXOL 350 MG/ML SOLN
100.0000 mL | Freq: Once | INTRAVENOUS | Status: AC | PRN
  Administered 2021-02-01: 100 mL via INTRAVENOUS

## 2021-02-01 MED ORDER — VANCOMYCIN HCL 1500 MG/300ML IV SOLN
1500.0000 mg | Freq: Once | INTRAVENOUS | Status: AC
  Administered 2021-02-01: 1500 mg via INTRAVENOUS
  Filled 2021-02-01: qty 300

## 2021-02-01 MED ORDER — SODIUM CHLORIDE 0.9 % IV BOLUS
1000.0000 mL | Freq: Once | INTRAVENOUS | Status: AC
  Administered 2021-02-01: 1000 mL via INTRAVENOUS

## 2021-02-01 MED ORDER — VANCOMYCIN HCL 10 G IV SOLR
1750.0000 mg | INTRAVENOUS | Status: DC
  Filled 2021-02-01: qty 1750

## 2021-02-01 MED ORDER — PIPERACILLIN-TAZOBACTAM 3.375 G IVPB
3.3750 g | Freq: Once | INTRAVENOUS | Status: AC
  Administered 2021-02-01: 3.375 g via INTRAVENOUS
  Filled 2021-02-01: qty 50

## 2021-02-01 MED ORDER — PIPERACILLIN-TAZOBACTAM 3.375 G IVPB
3.3750 g | Freq: Three times a day (TID) | INTRAVENOUS | Status: DC
  Administered 2021-02-02: 3.375 g via INTRAVENOUS
  Filled 2021-02-01: qty 50

## 2021-02-01 NOTE — ED Provider Notes (Signed)
[facility] EMERGENCY DEPARTMENT Provider Note   CSN: [id number] Arrival date & time: 02/01/21  1643     History Chief Complaint  Patient presents with  . Abscess    To left side of head, EMS was told that pt has had it for ten days, intermittently draining, hx ams oriented to self only, follows commands, no distress, abscess not currently draining    Tommy Bauer is a 80 y.o. male.  HPI Patient seen by me at 6:50 PM.  At this time he is pacing the room.  He has removed his monitoring leads.  He is not sure why he is here but states he has had a problem with infection of his head, for several months.  He states he has been seen for this previously but is unable to specify what was done or who saw him.  He lives in a nursing care facility.  The patient is reported to be full code.  Recent evaluations include a CT scan of the head, with and without contrast which was done on 01/18/2021.  Results at that time indicated a 3 cm right frontal parietal abscess.  There was some abnormality of the calvarium, possibly indicating osseous extension of infection.  There was no intracranial abnormality.  Patient presents with documentation including MAR from his facility.  There does not appear to be any antibiotic treatment, being administered at this time.  Level 5 caveat-altered mental status     Past Medical History:  Diagnosis Date  . ADENOCARCINOMA, PROSTATE 05/19/2010  . BACK PAIN 07/16/2008  . Carpal tunnel syndrome 10/15/2008  . COLONIC POLYPS, HX OF 06/08/2007  . Contusion    left shoulder,left knee,s/p recent fall end of march 2013  . CVA (cerebral vascular accident) ([place])   . ERECTILE DYSFUNCTION, ORGANIC 07/11/2007  . Headache(784.0) 07/11/2007  . HYPERTENSION 06/08/2007  . INGUINAL HERNIA 12/04/2007  . LEFT VENTRICULAR FUNCTION, DECREASED 11/12/2007  . Prostate cancer ([place]) 09/14/2009   prostateectomy,adenocarcinoma,gleason=4+3=7  . PROSTATE SPECIFIC ANTIGEN, ELEVATED  05/06/2009  . TOBACCO ABUSE 11/06/2009    Patient Active Problem List   Diagnosis Date Noted  . Scalp wound 02/02/2021  . Memory loss 12/14/2020  . Gait abnormality 12/14/2020  . Abnormal EEG 12/14/2020  . Hypothermia 09/17/2020  . AMS (altered mental status) 09/13/2020  . AKI (acute kidney injury) ([place]) 09/13/2020  . Conjunctivitis 09/13/2020  . Rhabdomyolysis 09/13/2020  . Central retinal artery occlusion 07/15/2015  . 80 year-old gentleman s/p radical prostatectomy for pT3a adenocarcinoma with a Gleason's Score of 4+3, with extracapsular extension noted on pathology, and rising PSA of 0.36 05/19/2010  . TOBACCO ABUSE 11/06/2009  . CARPAL TUNNEL SYNDROME 10/15/2008  . BACK PAIN 07/16/2008  . INGUINAL HERNIA 12/04/2007  . Cardiovascular disease 11/12/2007  . ERECTILE DYSFUNCTION, ORGANIC 07/11/2007  . HEADACHE 07/11/2007  . Essential hypertension 06/08/2007  . History of colonic polyps 06/08/2007    Past Surgical History:  Procedure Laterality Date  . HERNIA REPAIR     right ingunial  . PROSTATE SURGERY  09/14/2009   radical prostatectomy /gleason=4+3-7       Family History  Problem Relation Age of Onset  . Colon cancer Neg Hx     Social History   Tobacco Use  . Smoking status: Former Smoker    Years: 60.00    Types: Cigarettes  . Smokeless tobacco: Never Used  Substance Use Topics  . Alcohol use: Not Currently  . Drug use: No    Home Medications Prior  to Admission medications   Medication Sig Start Date End Date Taking? Authorizing Provider  acetaminophen (TYLENOL) 500 MG tablet Take 500 mg by mouth every 4 (four) hours as needed for moderate pain.     [provider]  aspirin 81 MG tablet Take 1 tablet (81 mg total) by mouth daily. 07/15/15   Marletta Lor, MD  atorvastatin (LIPITOR) 40 MG tablet Take 1 tablet (40 mg total) by mouth daily. 07/30/19   Nafziger, Tommi Rumps, NP  bimatoprost (LUMIGAN) 0.03 % ophthalmic solution Place 1 drop into both  eyes at bedtime.    [provider]  folic acid (FOLVITE) 1 MG tablet Take 1 mg by mouth daily.    [provider]  furosemide (LASIX) 40 MG tablet Take 40 mg by mouth.    [provider]  metoprolol succinate (TOPROL-XL) 100 MG 24 hr tablet TAKE ONE TABLET BY MOUTH ONCE DAILY TAKE  WITH  OR  IMMEDIATELY  FOLLOWING  A  MEAL Patient taking differently: No sig reported 11/09/18   [name], NP  Multiple Vitamin (MULTIVITAMIN) tablet Take 1 tablet by mouth daily.    [provider]  potassium chloride (KLOR-CON) 20 MEQ packet Take 40 mEq by mouth 2 (two) times daily. When taking lasix    [provider]  thiamine 100 MG tablet Take 100 mg by mouth daily.    [provider]  triamcinolone 0.025%-ammonium lactate 12% 1:1 cream mixture Apply topically.    [provider]    Allergies    Patient has no known allergies.  Review of Systems   Review of Systems  All other systems reviewed and are negative.   Physical Exam Updated Vital Signs BP (!) 190/67   Pulse (!) 49   Temp 98.7 F (37.1 C) (Oral)   Resp 10   SpO2 100%   Physical Exam Vitals and nursing note reviewed.  Constitutional:      Appearance: He is well-developed.  HENT:     Head: Normocephalic.     Comments: Right frontal scalp with draining wound, this area is flat, drainage is purulent in appearance.  In the hair adjacent to this area is a approximately 4 x 8 cm eschar which is lifted off the wound, and appears to be stuck in the hair of the scalp.  There is no significant tenderness with palpation of this area.    Right Ear: External ear normal.     Left Ear: External ear normal.  Eyes:     Conjunctiva/sclera: Conjunctivae normal.     Pupils: Pupils are equal, round, and reactive to light.  Neck:     Trachea: Phonation normal.  Cardiovascular:     Rate and Rhythm: Normal rate.     Heart sounds: Normal heart sounds.  Pulmonary:     Effort: Pulmonary  effort is normal.     Breath sounds: Normal breath sounds.  Abdominal:     Palpations: Abdomen is soft.     Tenderness: There is no abdominal tenderness.  Musculoskeletal:        General: Normal range of motion.     Cervical back: Normal range of motion and neck supple.  Skin:    General: Skin is warm and dry.  Neurological:     Mental Status: He is alert.     Cranial Nerves: No cranial nerve deficit.     Motor: No abnormal muscle tone.     Coordination: Coordination normal.  Psychiatric:  Mood and Affect: Mood normal.        Behavior: Behavior normal.     ED Results / Procedures / Treatments   Labs (all labs ordered are listed, but only abnormal results are displayed) Labs Reviewed  CBC WITH DIFFERENTIAL/PLATELET - Abnormal; Notable for the following components:      Result Value   Hemoglobin 12.8 (*)    RDW 16.3 (*)    All other components within normal limits  BASIC METABOLIC PANEL - Abnormal; Notable for the following components:   Glucose, Bld 111 (*)    All other components within normal limits  CULTURE, BLOOD (ROUTINE X 2)  CULTURE, BLOOD (ROUTINE X 2)  SARS CORONAVIRUS 2 (TAT 6-24 HRS)  LACTIC ACID, PLASMA  LACTIC ACID, PLASMA    EKG EKG Interpretation  Date/Time:  Monday February 01 2021 17:39:20 EDT Ventricular Rate:  71 PR Interval:  281 QRS Duration: 113 QT Interval:  455 QTC Calculation: 495 R Axis:   39 Text Interpretation: Sinus rhythm Atrial premature complex Prolonged PR interval Borderline intraventricular conduction delay Abnormal R-wave progression, early transition Borderline ST depression, inferior leads Borderline prolonged QT interval Since last tracing rate slower , QT has shortened Otherwise no significant change Confirmed by Daleen Bo [phone]) on 02/01/2021 5:48:30 PM   Radiology No results found.   Head CT result-today IMPRESSION: 1. Persistent diffuse soft tissue thickening and irregularity involving the right frontoparietal  scalp, concerning for infection/cellulitis. Suspected 3-4 cm fluid collection within this region, suspicious for abscess. Irregularity of the underlying outer table of the calvarium suspicious for associated osseous involvement/osteomyelitis. No evidence for intracranial involvement. 2. No other acute intracranial abnormality. 3. Stable atrophy with moderate chronic microvascular ischemic disease. 4. Moderate right mastoid effusion, similar to previous.    Procedures Wound repair  Date/Time: 02/02/2021 12:09 AM Performed by: Daleen Bo, MD Authorized by: Daleen Bo, MD  Consent: Verbal consent obtained. Consent given by: patient Patient understanding: patient states understanding of the procedure being performed Imaging studies: imaging studies available Required items: required blood products, implants, devices, and special equipment available Patient identity confirmed: verbally with patient Time out: Immediately prior to procedure a "time out" was called to verify the correct patient, procedure, equipment, support staff and site/side marked as required. Local anesthesia used: no  Anesthesia: Local anesthesia used: no  Sedation: Patient sedated: no  Patient tolerance: patient tolerated the procedure well with no immediate complications Comments: Large nonviable skin avulsion, debrided by trimming granulation tissue with scissors, that was holding the flap, over the wound.  The flap measures approximately 4 x 9 cm.  Beneath the wound was granulation tissue, slightly discolored.  No active bleeding or drainage.  See representative photos. No fluctuance or crepitation.  .Critical Care Performed by: Daleen Bo, MD Authorized by: Daleen Bo, MD   Critical care provider statement:    Critical care time (minutes):  35   Critical care start time:  02/01/2021 6:50 PM   Critical care end time:  02/02/2021 12:26 AM   Critical care time was exclusive of:  Separately  billable procedures and treating other patients   Critical care was necessary to treat or prevent imminent or life-threatening deterioration of the following conditions:  Sepsis   Critical care was time spent personally by me on the following activities:  Blood draw for specimens, development of treatment plan with patient or surrogate, discussions with consultants, evaluation of patient's response to treatment, examination of patient, obtaining history from patient or surrogate, ordering and  performing treatments and interventions, ordering and review of laboratory studies, pulse oximetry, re-evaluation of patient's condition, review of old charts and ordering and review of radiographic studies                    Medications Ordered in ED Medications  vancomycin (VANCOCIN) 1,750 mg in sodium chloride 0.9 % 500 mL IVPB (has no administration in time range)  piperacillin-tazobactam (ZOSYN) IVPB 3.375 g (has no administration in time range)  sodium chloride 0.9 % bolus 1,000 mL (0 mLs Intravenous Stopped 02/01/21 2141)  piperacillin-tazobactam (ZOSYN) IVPB 3.375 g (3.375 g Intravenous New Bag/Given 02/01/21 2022)  vancomycin (VANCOREADY) IVPB 1500 mg/300 mL (0 mg Intravenous Stopped 02/02/21 0003)  iohexol (OMNIPAQUE) 350 MG/ML injection 100 mL (100 mLs Intravenous Contrast Given 02/01/21 2204)    ED Course  I have reviewed the triage vital signs and the nursing notes.  Pertinent labs & imaging results that were available during my care of the patient were reviewed by me and considered in my medical decision making (see chart for details).  Clinical Course as of 02/02/21 0052  Mon Feb 01, 2021  2300 Lactic acid, plasma [EW]  2304 I received a call from Yevonne Aline, who is his ex-wife, and states that she is his healthcare power of attorney.  She states that the patient has chronic problems with skin infections, and may have injured his scalp in a fall.  She states that the patient is not  currently being treated with antibiotics at his healthcare facility which is a wrist home. [EW]    Clinical Course User Index [EW] Daleen Bo, MD   MDM Rules/Calculators/A&P                           Patient Vitals for the past 24 hrs:  BP Temp Temp src Pulse Resp SpO2  02/01/21 2300 (!) 190/67 -- -- (!) 49 10 100 %  02/01/21 2245 -- -- -- (!) 37 17 93 %  02/01/21 2240 -- -- -- (!) 30 17 92 %  02/01/21 2235 -- -- -- (!) 27 17 91 %  02/01/21 2230 -- -- -- (!) 41 16 94 %  02/01/21 2225 -- -- -- (!) 36 (!) 24 96 %  02/01/21 2220 -- -- -- (!) 26 15 95 %  02/01/21 2215 -- -- -- 72 17 93 %  02/01/21 2210 -- -- -- 70 17 96 %  02/01/21 2150 (!) 142/85 -- -- 61 (!) 22 98 %  02/01/21 2130 (!) 142/85 -- -- -- (!) 22 --  02/01/21 2029 (!) 152/65 -- -- 69 16 100 %  02/01/21 1800 135/78 -- -- 80 20 99 %  02/01/21 1738 (!) 149/86 98.7 F (37.1 C) Oral 69 (!) 21 97 %  02/01/21 1645 -- -- -- -- -- 97 %    12:52 AM Reevaluation with update and discussion. After initial assessment and treatment, an updated evaluation reveals he remains comfortable after debridement.  No other change in clinical status. Daleen Bo   Medical Decision Making:  This patient is presenting for evaluation of scalp infection, which does require a range of treatment options, and is a complaint that involves a moderate risk of morbidity and mortality. The differential diagnoses include cellulitis, osteomyelitis, head wound. I decided to review old records, and in summary elderly male living in a nursing care facility, currently being treated there for chronic wounds, in multiple areas.  Presenting  for evaluation of wound, unclear when it occurred.  I obtained additional historical information from his healthcare power of attorney, ex-wife, Tommy Bauer.  Her phone number is [phone].  Clinical Laboratory Tests Ordered, included CBC, Metabolic panel and Lactate, blood culture. Review indicates normal lactate, normal  white count, hemoglobin slightly low. Radiologic Tests Ordered, included CT head, with and without contrast.  I independently Visualized: Radiograph images, which show chronic infection, with skeletal abnormality indicating osteomyelitis.    Critical Interventions-clinical evaluation, laboratory testing, radiography, observation, debridement of wound by me  After These Interventions, the Patient was reevaluated and was found with a large wound, 4 x 9 cm, likely full-thickness scalp avulsion, unclear when it occurred.  This amounts to an open wound has been present for unknown period of time, presenting with an eschar which is the consistency of leather, covering a granulating large skin defect, at least 4 x 9 cm.  The eschar was removed and I believe represents full-thickness scalp avulsion, which is completely avascular, denervated, and completely dry.  CRITICAL CARE-yes Performed by: Daleen Bo  Nursing Notes Reviewed/ Care Coordinated Applicable Imaging Reviewed Interpretation of Laboratory Data incorporated into ED treatment  12:52 AM-Consult complete with hospitalist. Patient case explained and discussed.  He agrees to admit patient for further evaluation and treatment. Call ended at 12:52 AM  Plan: Admit    Final Clinical Impression(s) / ED Diagnoses Final diagnoses:  Wound infection  Osteomyelitis, unspecified site, unspecified type [facility])    Rx / [place] Orders ED Discharge Orders    None       Daleen Bo, MD 02/02/21 [phone]

## 2021-02-01 NOTE — Progress Notes (Signed)
Pharmacy Antibiotic Note  Tommy Bauer is a 80 y.o. male admitted on 02/01/2021 with cellulitis.  Pharmacy has been consulted for Zosyn and Vancomycin dosing.      Temp (24hrs), Avg:98.7 F (37.1 C), Min:98.7 F (37.1 C), Max:98.7 F (37.1 C)  Recent Labs  Lab 02/01/21 1856 02/01/21 2007  WBC 6.1  --   CREATININE 1.03  --   LATICACIDVEN  --  1.1    CrCl cannot be calculated (Unknown ideal weight.).    No Known Allergies  Antimicrobials this admission: 4/4 Zosyn >>  4/4 Vancomycin >>   Dose adjustments this admission: N/a  Microbiology results: Pending   Plan:  - Zosyn 3.375g IV x 1 dose over 30 min followed by Zosyn 3.375g IV every 8 hours infused over 4 hours  - Vancomycin 1500mg  IV x 1 dose  - Followed by Vancomycin 1750mg  IV q24h  - Est Calc AUC 452 - Monitor patients renal function and urine output  - De-escalate ABX when appropriate   Thank you for allowing pharmacy to be a part of this patient's care.  Duanne Limerick PharmD. BCPS 02/01/2021 9:55 PM

## 2021-02-01 NOTE — ED Triage Notes (Signed)
Pleasant and cooperative, oriented to self only, large, loose scab noted to left side of head just above ear, not currently draining

## 2021-02-01 NOTE — ED Notes (Addendum)
Pt noted to have what appears as OSA.  He snores and his sats drop in to the 50;s and 60's.  PT placed on 3L to maintain sats over 93.  Pt still observed with a bradycardic HR in the 30's and 40's when apneic. HR rebounds with normal breathing. EDP notified.

## 2021-02-01 NOTE — ED Notes (Signed)
Waiting on a room to complete triage and obtain VS, pt sitting calmly next to nurses desk, no distress, no complaints

## 2021-02-01 NOTE — ED Notes (Signed)
Patient ex-wife Eldin Bonsell called to get an update. Please call back [phone]

## 2021-02-01 NOTE — ED Notes (Signed)
Entered room to find that the pt had removed his 20G IV from his left AC.Marland Kitchen And was bleeding from the site. His NS was done but his ABX were not.  STarted a 20 G in left FA and restarted ABX.  IV is wrapped with kling and coban.

## 2021-02-02 DIAGNOSIS — Z79899 Other long term (current) drug therapy: Secondary | ICD-10-CM | POA: Diagnosis not present

## 2021-02-02 DIAGNOSIS — S0100XA Unspecified open wound of scalp, initial encounter: Secondary | ICD-10-CM | POA: Diagnosis present

## 2021-02-02 DIAGNOSIS — G4734 Idiopathic sleep related nonobstructive alveolar hypoventilation: Secondary | ICD-10-CM

## 2021-02-02 DIAGNOSIS — Z7982 Long term (current) use of aspirin: Secondary | ICD-10-CM | POA: Diagnosis not present

## 2021-02-02 DIAGNOSIS — M868X8 Other osteomyelitis, other site: Secondary | ICD-10-CM | POA: Diagnosis not present

## 2021-02-02 DIAGNOSIS — T148XXA Other injury of unspecified body region, initial encounter: Secondary | ICD-10-CM | POA: Diagnosis not present

## 2021-02-02 DIAGNOSIS — R0682 Tachypnea, not elsewhere classified: Secondary | ICD-10-CM | POA: Diagnosis not present

## 2021-02-02 DIAGNOSIS — M2550 Pain in unspecified joint: Secondary | ICD-10-CM | POA: Diagnosis not present

## 2021-02-02 DIAGNOSIS — M8610 Other acute osteomyelitis, unspecified site: Secondary | ICD-10-CM | POA: Diagnosis not present

## 2021-02-02 DIAGNOSIS — E782 Mixed hyperlipidemia: Secondary | ICD-10-CM | POA: Diagnosis not present

## 2021-02-02 DIAGNOSIS — E785 Hyperlipidemia, unspecified: Secondary | ICD-10-CM

## 2021-02-02 DIAGNOSIS — Z8546 Personal history of malignant neoplasm of prostate: Secondary | ICD-10-CM | POA: Diagnosis not present

## 2021-02-02 DIAGNOSIS — Z8673 Personal history of transient ischemic attack (TIA), and cerebral infarction without residual deficits: Secondary | ICD-10-CM

## 2021-02-02 DIAGNOSIS — I441 Atrioventricular block, second degree: Secondary | ICD-10-CM | POA: Diagnosis present

## 2021-02-02 DIAGNOSIS — R404 Transient alteration of awareness: Secondary | ICD-10-CM | POA: Diagnosis not present

## 2021-02-02 DIAGNOSIS — R9431 Abnormal electrocardiogram [ECG] [EKG]: Secondary | ICD-10-CM | POA: Diagnosis not present

## 2021-02-02 DIAGNOSIS — M869 Osteomyelitis, unspecified: Secondary | ICD-10-CM | POA: Diagnosis not present

## 2021-02-02 DIAGNOSIS — Z20822 Contact with and (suspected) exposure to covid-19: Secondary | ICD-10-CM | POA: Diagnosis not present

## 2021-02-02 DIAGNOSIS — I10 Essential (primary) hypertension: Secondary | ICD-10-CM | POA: Diagnosis not present

## 2021-02-02 DIAGNOSIS — L03811 Cellulitis of head [any part, except face]: Secondary | ICD-10-CM

## 2021-02-02 DIAGNOSIS — L02811 Cutaneous abscess of head [any part, except face]: Secondary | ICD-10-CM | POA: Diagnosis not present

## 2021-02-02 DIAGNOSIS — Z87891 Personal history of nicotine dependence: Secondary | ICD-10-CM | POA: Diagnosis not present

## 2021-02-02 DIAGNOSIS — R4182 Altered mental status, unspecified: Secondary | ICD-10-CM | POA: Diagnosis not present

## 2021-02-02 DIAGNOSIS — R001 Bradycardia, unspecified: Secondary | ICD-10-CM

## 2021-02-02 DIAGNOSIS — R739 Hyperglycemia, unspecified: Secondary | ICD-10-CM | POA: Diagnosis not present

## 2021-02-02 DIAGNOSIS — Z86010 Personal history of colonic polyps: Secondary | ICD-10-CM | POA: Diagnosis not present

## 2021-02-02 DIAGNOSIS — D649 Anemia, unspecified: Secondary | ICD-10-CM | POA: Diagnosis not present

## 2021-02-02 DIAGNOSIS — Z7401 Bed confinement status: Secondary | ICD-10-CM | POA: Diagnosis not present

## 2021-02-02 LAB — CREATININE, SERUM
Creatinine, Ser: 0.97 mg/dL (ref 0.61–1.24)
GFR, Estimated: >60 mL/min (ref >60)

## 2021-02-02 LAB — CBC
HCT: 44.5 % (ref 39.0–52.0)
Hemoglobin: 13 g/dL (ref 13.0–17.0)
MCH: 26.4 pg (ref 26.0–34.0)
MCHC: 29.2 g/dL — ABNORMAL LOW (ref 30.0–36.0)
MCV: 90.4 fL (ref 80.0–100.0)
Platelets: 255 10*3/uL (ref 150–400)
RBC: 4.92 MIL/uL (ref 4.22–5.81)
RDW: 16.3 % — ABNORMAL HIGH (ref 11.5–15.5)
WBC: 5.5 10*3/uL (ref 4.0–10.5)
nRBC: 0 % (ref 0.0–0.2)

## 2021-02-02 LAB — SARS CORONAVIRUS 2 (TAT 6-24 HRS): SARS Coronavirus 2: NEGATIVE

## 2021-02-02 MED ORDER — FOLIC ACID 1 MG PO TABS
1.0000 mg | ORAL_TABLET | Freq: Every day | ORAL | Status: DC
  Administered 2021-02-02 – 2021-02-07 (×6): 1 mg via ORAL
  Filled 2021-02-02 (×6): qty 1

## 2021-02-02 MED ORDER — ADULT MULTIVITAMIN W/MINERALS CH
1.0000 | ORAL_TABLET | Freq: Every day | ORAL | Status: DC
  Administered 2021-02-02 – 2021-02-07 (×6): 1 via ORAL
  Filled 2021-02-02 (×6): qty 1

## 2021-02-02 MED ORDER — ATORVASTATIN CALCIUM 40 MG PO TABS
40.0000 mg | ORAL_TABLET | Freq: Every day | ORAL | Status: DC
  Administered 2021-02-02 – 2021-02-07 (×6): 40 mg via ORAL
  Filled 2021-02-02 (×6): qty 1

## 2021-02-02 MED ORDER — ENOXAPARIN SODIUM 40 MG/0.4ML ~~LOC~~ SOLN
40.0000 mg | SUBCUTANEOUS | Status: DC
  Administered 2021-02-02 – 2021-02-07 (×6): 40 mg via SUBCUTANEOUS
  Filled 2021-02-02 (×7): qty 0.4

## 2021-02-02 MED ORDER — VANCOMYCIN HCL 1250 MG/250ML IV SOLN
1250.0000 mg | Freq: Two times a day (BID) | INTRAVENOUS | Status: DC
  Administered 2021-02-02 – 2021-02-03 (×4): 1250 mg via INTRAVENOUS
  Filled 2021-02-02 (×5): qty 250

## 2021-02-02 MED ORDER — METOPROLOL SUCCINATE ER 25 MG PO TB24: 100.0000 mg | ORAL_TABLET | Freq: Every day | ORAL | Status: DC

## 2021-02-02 MED ORDER — ASPIRIN 81 MG PO CHEW
81.0000 mg | CHEWABLE_TABLET | Freq: Every day | ORAL | Status: DC
  Administered 2021-02-02 – 2021-02-07 (×6): 81 mg via ORAL
  Filled 2021-02-02 (×6): qty 1

## 2021-02-02 MED ORDER — THIAMINE HCL 100 MG PO TABS
100.0000 mg | ORAL_TABLET | Freq: Every day | ORAL | Status: DC
  Administered 2021-02-02 – 2021-02-07 (×6): 100 mg via ORAL
  Filled 2021-02-02 (×6): qty 1

## 2021-02-02 MED ORDER — SODIUM CHLORIDE 0.9 % IV SOLN
2.0000 g | Freq: Two times a day (BID) | INTRAVENOUS | Status: DC
  Administered 2021-02-02 – 2021-02-04 (×5): 2 g via INTRAVENOUS
  Filled 2021-02-02 (×2): qty 2
  Filled 2021-02-02 (×3): qty 20
  Filled 2021-02-02: qty 2

## 2021-02-02 NOTE — Progress Notes (Signed)
Pharmacy Antibiotic Note  Tommy Bauer is a 80 y.o. male admitted on 02/01/2021 with head abscess - 3cm R parietal abscess noted on 3/21 outpatient CT. Repeat CT head suspected 3-4 cm fluid collection suspicious for abscess and suspicion for osteomyelitis. S/p I&D in ED. Pharmacy has been consulted for vancomycin and Zosyn dosing. Patient started on vancomycin 1750mg  q24h based on AUC dosing. WBC wnl. Afebrile. Scr 1.03 with current CrCl ~63 ml/min. Scr at baseline.   Plan: Change vancomycin to 1250mg  IV q12h based on nomogram due to location of infection Continue Zosyn 3.375g IV q8h (EI) Monitor cultures, renal function, and clinical progression  Height: 6' (182.9 cm) Weight: 90.7 kg (200 lb) IBW/kg (Calculated) : 77.6  Temp (24hrs), Avg:98.3 F (36.8 C), Min:97.8 F (36.6 C), Max:98.7 F (37.1 C)  Recent Labs  Lab 02/01/21 1856 02/01/21 2007  WBC 6.1  --   CREATININE 1.03  --   LATICACIDVEN  --  1.1    Estimated Creatinine Clearance: 62.8 mL/min (by C-G formula based on SCr of 1.03 mg/dL).    No Known Allergies  Antimicrobials this admission: N/a  Dose adjustments this admission: 4/5: changed vancomycin from 1750mg  q24h (AUC dosing) to 1250mg  q12h (nomogram dosing) based on location of infection  Microbiology results: 4/4 Bcx:   Thank you for allowing pharmacy to be a part of this patient's care.  Cristela Felt, PharmD Clinical Pharmacist  02/02/2021 7:21 AM

## 2021-02-02 NOTE — ED Notes (Signed)
Pt resting comfortably in bed, with eyes closed. respirations are even and non-labored, pt does not appear in distress Skin is warm, dry and intact

## 2021-02-02 NOTE — Consult Note (Signed)
CARDIOLOGY CONSULT NOTE  Patient ID: Tommy Bauer MRN: [id number] DOB/AGE: 80/20/1942 80 y.o.  Admit date: 02/01/2021 Referring Physician  [name], DO Primary Physician:  [name], NP Reason for Consultation  Bradycardia  Patient ID: Tommy Bauer, male    DOB: 07-01-1941, 80 y.o.   MRN: [id number]  Chief Complaint  Patient presents with  . Abscess    To left side of head, EMS was told that pt has had it for ten days, intermittently draining, hx ams oriented to self only, follows commands, no distress, abscess not currently draining   HPI:    Tommy Bauer  is a 80 y.o. African-American male with history of hypertension, hyperlipidemia, alcohol abuse, prostate cancer (2011), and CVA.  Patient denies history of MI, DVT/PE, diabetes.    Patient presented to the emergency department with right-sided scalp wound suspicious for abscess without evidence of intracranial involvement.  Patient was subsequently started on IV vancomycin and Zosyn for treatment of this.    While in the emergency department patient was noted to be bradycardic overnight with heart rate as low as 36 bpm.  Patient also intermittently hypoxic and tachypneic while sleeping.  Unfortunately telemetry is not available for review, as patient is no longer on cardiac monitor since transfer to [facility].  However review of serial EKGs does reveal Mobitz type I AV block during this admission (both at night and during the day), as well as in November 2021 and likely as far back as 2014.  Patient seen and examined in the bed this afternoon.  He he is asymptomatic from a bradycardia standpoint.  Denies dyspnea, dizziness, syncope, near syncope.  Patient denies chest pain, palpitations, orthopnea.   Past Medical History:  Diagnosis Date  . ADENOCARCINOMA, PROSTATE 05/19/2010  . BACK PAIN 07/16/2008  . Carpal tunnel syndrome 10/15/2008  . COLONIC POLYPS, HX OF 06/08/2007  . Contusion    left shoulder,left knee,s/p recent fall end of  march 2013  . CVA (cerebral vascular accident) ([place])   . ERECTILE DYSFUNCTION, ORGANIC 07/11/2007  . Headache(784.0) 07/11/2007  . HYPERTENSION 06/08/2007  . INGUINAL HERNIA 12/04/2007  . LEFT VENTRICULAR FUNCTION, DECREASED 11/12/2007  . Prostate cancer ([place]) 09/14/2009   prostateectomy,adenocarcinoma,gleason=4+3=7  . PROSTATE SPECIFIC ANTIGEN, ELEVATED 05/06/2009  . TOBACCO ABUSE 11/06/2009   Past Surgical History:  Procedure Laterality Date  . HERNIA REPAIR     right ingunial  . PROSTATE SURGERY  09/14/2009   radical prostatectomy /gleason=4+3-7   Family History  Problem Relation Age of Onset  . Colon cancer Neg Hx    Social History   Tobacco Use  . Smoking status: Former Smoker    Years: 60.00    Types: Cigarettes  . Smokeless tobacco: Never Used  Substance Use Topics  . Alcohol use: Not Currently    Marital Sttus: Married  ROS  Review of Systems  Cardiovascular: Negative for chest pain, dyspnea on exertion, leg swelling, near-syncope, orthopnea, palpitations, paroxysmal nocturnal dyspnea and syncope.  Respiratory: Negative for shortness of breath.   Neurological: Negative for dizziness and light-headedness.  All other systems reviewed and are negative.  Objective   Vitals with BMI 02/02/2021 02/02/2021 02/02/2021  Height - - 6\' 0"   Weight - - 200 lbs  BMI - - 50.09  Systolic 381 829 937  Diastolic 80 90 60  Pulse 57 - -    Blood pressure 134/80, pulse (!) 57, temperature 98.3 F (36.8 C), temperature source Oral, resp. rate 19, height 6' (1.829 m), weight 90.7  kg, SpO2 100 %.    Physical Exam Vitals reviewed.  Constitutional:      General: He is not in acute distress. HENT:     Head: Normocephalic and atraumatic.     Comments: Patient with wound dressing present on right side of scalp.     Nose: Nose normal.     Mouth/Throat:     Mouth: Mucous membranes are moist.  Eyes:     Extraocular Movements: Extraocular movements intact.     Conjunctiva/sclera: Conjunctivae  normal.  Cardiovascular:     Rate and Rhythm: Normal rate. Rhythm irregular.     Pulses: Intact distal pulses.     Heart sounds: S1 normal and S2 normal. No murmur heard. No gallop.   Pulmonary:     Effort: Pulmonary effort is normal. No respiratory distress.     Breath sounds: No wheezing, rhonchi or rales.  Abdominal:     General: Bowel sounds are normal. There is no distension.     Palpations: Abdomen is soft.  Musculoskeletal:     Right lower leg: No edema.     Left lower leg: No edema.  Skin:    General: Skin is warm and dry.  Neurological:     General: No focal deficit present.     Mental Status: He is alert. He is disoriented.     Comments: Patient oriented to self and place, not to time, date, president, or current condition  Psychiatric:     Comments: Calm and cooperative     Laboratory examination:    Recent Labs    09/17/20 0435 12/14/20 1544 02/01/21 1856 02/02/21 0802  NA 138 139 137  --   K 3.8 4.2 4.0  --   CL 102 97 104  --   CO2 25 24 28   --   GLUCOSE 105* 91 111*  --   BUN 20 13 20   --   CREATININE 1.33* 1.16 1.03 0.97  CALCIUM 8.1* 9.3 9.3  --   GFRNONAA 54* 60 >60 >60  GFRAA  --  69  --   --    estimated creatinine clearance is 66.7 mL/min (by C-G formula based on SCr of 0.97 mg/dL).  CMP Latest Ref Rng & Units 02/02/2021 02/01/2021 12/14/2020  Glucose 70 - 99 mg/dL - 111(H) 91  BUN 8 - 23 mg/dL - 20 13  Creatinine 0.61 - 1.24 mg/dL 0.97 1.03 1.16  Sodium 135 - 145 mmol/L - 137 139  Potassium 3.5 - 5.1 mmol/L - 4.0 4.2  Chloride 98 - 111 mmol/L - 104 97  CO2 22 - 32 mmol/L - 28 24  Calcium 8.9 - 10.3 mg/dL - 9.3 9.3  Total Protein 6.0 - 8.5 g/dL - - 8.1  Total Bilirubin 0.0 - 1.2 mg/dL - - 0.5  Alkaline Phos 44 - 121 IU/L - - 86  AST 0 - 40 IU/L - - 16  ALT 0 - 44 IU/L - - 14   CBC Latest Ref Rng & Units 02/02/2021 02/01/2021 12/14/2020  WBC 4.0 - 10.5 K/uL 5.5 6.1 6.3  Hemoglobin 13.0 - 17.0 g/dL 13.0 12.8(L) 12.2(L)  Hematocrit 39.0 - 52.0 %  44.5 42.2 39.7  Platelets 150 - 400 K/uL 255 284 -   Lipid Panel No results for input(s): CHOL, TRIG, LDLCALC, VLDL, HDL, CHOLHDL, LDLDIRECT in the last 8760 hours.  HEMOGLOBIN A1C No results found for: HGBA1C, MPG TSH Recent Labs    09/13/20 1201  TSH 0.725   BNP (last 3  results) No results for input(s): BNP in the last 8760 hours. Results for orders placed or performed during the hospital encounter of 02/01/21 (from the past 48 hour(s))  CBC with Differential     Status: Abnormal   Collection Time: 02/01/21  6:56 PM  Result Value Ref Range   WBC 6.1 4.0 - 10.5 K/uL   RBC 4.65 4.22 - 5.81 MIL/uL   Hemoglobin 12.8 (L) 13.0 - 17.0 g/dL   HCT 42.2 39.0 - 52.0 %   MCV 90.8 80.0 - 100.0 fL   MCH 27.5 26.0 - 34.0 pg   MCHC 30.3 30.0 - 36.0 g/dL   RDW 16.3 (H) 11.5 - 15.5 %   Platelets 284 150 - 400 K/uL   nRBC 0.0 0.0 - 0.2 %   Neutrophils Relative % 58 %   Neutro Abs 3.6 1.7 - 7.7 K/uL   Lymphocytes Relative 24 %   Lymphs Abs 1.4 0.7 - 4.0 K/uL   Monocytes Relative 10 %   Monocytes Absolute 0.6 0.1 - 1.0 K/uL   Eosinophils Relative 7 %   Eosinophils Absolute 0.4 0.0 - 0.5 K/uL   Basophils Relative 1 %   Basophils Absolute 0.0 0.0 - 0.1 K/uL   Immature Granulocytes 0 %   Abs Immature Granulocytes 0.02 0.00 - 0.07 K/uL    Comment: Performed at [facility], [address] [place], [place] [address]  Basic metabolic panel     Status: Abnormal   Collection Time: 02/01/21  6:56 PM  Result Value Ref Range   Sodium 137 135 - 145 mmol/L   Potassium 4.0 3.5 - 5.1 mmol/L   Chloride 104 98 - 111 mmol/L   CO2 28 22 - 32 mmol/L   Glucose, Bld 111 (H) 70 - 99 mg/dL    Comment: Glucose reference range applies only to samples taken after fasting for at least 8 hours.   BUN 20 8 - 23 mg/dL   Creatinine, Ser 1.03 0.61 - 1.24 mg/dL   Calcium 9.3 8.9 - 10.3 mg/dL   GFR, Estimated >60 >60 mL/min    Comment: (NOTE) Calculated using the CKD-EPI Creatinine Equation (2021)     Anion gap 5 5 - 15    Comment: Performed at [facility] [address], [place], [place] [address]  Lactic acid, plasma     Status: None   Collection Time: 02/01/21  8:07 PM  Result Value Ref Range   Lactic Acid, Venous 1.1 0.5 - 1.9 mmol/L    Comment: Performed at [facility] [address], [place], [place] [address]  Culture, blood (routine x 2)     Status: None (Preliminary result)   Collection Time: 02/01/21  8:07 PM   Specimen: BLOOD  Result Value Ref Range   Specimen Description BLOOD LEFT ANTECUBITAL    Special Requests      BOTTLES DRAWN AEROBIC AND ANAEROBIC Blood Culture results may not be optimal due to an inadequate volume of blood received in culture bottles   Culture      NO GROWTH < 12 HOURS Performed at [facility] [address] [place], [place] [address]    Report Status PENDING   Culture, blood (routine x 2)     Status: None (Preliminary result)   Collection Time: 02/01/21  9:09 PM   Specimen: BLOOD RIGHT HAND  Result Value Ref Range   Specimen Description BLOOD RIGHT HAND    Special Requests  BOTTLES DRAWN AEROBIC AND ANAEROBIC Blood Culture results may not be optimal due to an inadequate volume of blood received in culture bottles   Culture      NO GROWTH < 12 HOURS Performed at [facility] [address], [place], [place] [address]    Report Status PENDING   SARS CORONAVIRUS 2 (TAT 6-24 HRS) Nasopharyngeal Nasopharyngeal Swab     Status: None   Collection Time: 02/02/21 12:09 AM   Specimen: Nasopharyngeal Swab  Result Value Ref Range   SARS Coronavirus 2 NEGATIVE NEGATIVE    Comment: (NOTE) SARS-CoV-2 target nucleic acids are NOT DETECTED.  The SARS-CoV-2 RNA is generally detectable in upper and lower respiratory specimens during the acute phase of infection. Negative results do not preclude SARS-CoV-2 infection, do not rule out co-infections with other pathogens, and should not be used as the sole basis for  treatment or other patient management decisions. Negative results must be combined with clinical observations, patient history, and epidemiological information. The expected result is Negative.  Fact Sheet for Patients: SugarRoll.be  Fact Sheet for Healthcare Providers: https://www.woods-mathews.com/  This test is not yet approved or cleared by the [place] FDA and  has been authorized for detection and/or diagnosis of SARS-CoV-2 by FDA under an Emergency Use Authorization (EUA). This EUA will remain  in effect (meaning this test can be used) for the duration of the COVID-19 declaration under Se ction 564(b)(1) of the Act, 21 U.S.C. section 360bbb-3(b)(1), unless the authorization is terminated or revoked sooner.  Performed at [facility], [place] [address], [place], [place] [address]   CBC     Status: Abnormal   Collection Time: 02/02/21  8:02 AM  Result Value Ref Range   WBC 5.5 4.0 - 10.5 K/uL   RBC 4.92 4.22 - 5.81 MIL/uL   Hemoglobin 13.0 13.0 - 17.0 g/dL   HCT 44.5 39.0 - 52.0 %   MCV 90.4 80.0 - 100.0 fL   MCH 26.4 26.0 - 34.0 pg   MCHC 29.2 (L) 30.0 - 36.0 g/dL   RDW 16.3 (H) 11.5 - 15.5 %   Platelets 255 150 - 400 K/uL   nRBC 0.0 0.0 - 0.2 %    Comment: Performed at [facility], [place] [address], [place], [place] [address]  Creatinine, serum     Status: None   Collection Time: 02/02/21  8:02 AM  Result Value Ref Range   Creatinine, Ser 0.97 0.61 - 1.24 mg/dL   GFR, Estimated >60 >60 mL/min    Comment: (NOTE) Calculated using the CKD-EPI Creatinine Equation (2021) Performed at [facility] [address], [place], [place] [address]     Medications and allergies  No Known Allergies   Current Meds  Medication Sig  . acetaminophen (TYLENOL) 500 MG tablet Take 500 mg by mouth every 4 (four) hours as needed for moderate pain.   Marland Kitchen ammonium lactate (AMLACTIN) 12 % cream Apply topically at bedtime.  Apply to lower legs and feet  . aspirin 81 MG tablet Take 1 tablet (81 mg total) by mouth daily.  Marland Kitchen atorvastatin (LIPITOR) 40 MG tablet Take 1 tablet (40 mg total) by mouth daily.  . bimatoprost (LUMIGAN) 0.03 % ophthalmic solution Place 1 drop into both eyes at bedtime.  . Diclofenac Sodium 3 % GEL Apply 1 application topically every 8 (eight) hours as needed (knee pain).  . Diclofenac Sodium 3 % GEL Apply 1 application topically See admin instructions. To  both knees bid x 4 days  . folic acid (FOLVITE) 1 MG tablet Take 1 mg by mouth daily.  . furosemide (LASIX) 40 MG tablet Take 40 mg by mouth.  . Infant Care Products (DERMACLOUD EX) Apply 1 application topically daily. Apply to scrotum and sacrum  . metoprolol succinate (TOPROL-XL) 100 MG 24 hr tablet TAKE ONE TABLET BY MOUTH ONCE DAILY TAKE  WITH  OR  IMMEDIATELY  FOLLOWING  A  MEAL (Patient taking differently: Take 100 mg by mouth daily.)  . Multiple Vitamin (MULTIVITAMIN) tablet Take 1 tablet by mouth daily.  . potassium chloride (KLOR-CON) 20 MEQ packet Take 40 mEq by mouth daily. When taking lasix  . Skin Protectants, Misc. (EUCERIN) cream Apply 1 application topically daily. Apply to extremities,trunk and back after bath  . thiamine 100 MG tablet Take 100 mg by mouth daily.  . [DISCONTINUED] Diclofenac Sodium 3 % CREA Apply 2-4 g topically See admin instructions. Apply to both knees bid x 4 days  . [DISCONTINUED] triamcinolone 0.025%-ammonium lactate 12% 1:1 cream mixture Apply topically.    Scheduled Meds: . aspirin  81 mg Oral Daily  . atorvastatin  40 mg Oral Daily  . enoxaparin (LOVENOX) injection  40 mg Subcutaneous Q24H  . folic acid  1 mg Oral Daily  . multivitamin with minerals  1 tablet Oral Daily  . thiamine  100 mg Oral Daily   Continuous Infusions: . cefTRIAXone (ROCEPHIN)  IV 2 g (02/02/21 1106)  . vancomycin Stopped (02/02/21 0921)   PRN Meds:.   I/O last 3 completed shifts: In: 1300 [IV Piggyback:1300] Out: -   Total I/O In: 350 [IV Piggyback:350] Out: 200 [Urine:200]    Radiology:   CT Head W or Wo Contrast  Result Date: 02/01/2021 CLINICAL DATA:  Initial evaluation for cellulitis of right scalp. EXAM: CT HEAD WITHOUT AND WITH CONTRAST TECHNIQUE: Contiguous axial images were obtained from the base of the skull through the vertex without and with intravenous contrast CONTRAST:  129mL OMNIPAQUE IOHEXOL 350 MG/ML SOLN COMPARISON:  Prior head CT from 01/18/2021. FINDINGS: Brain: Generalized age-related cerebral atrophy with moderate chronic microvascular ischemic disease, stable. No acute intracranial hemorrhage. No acute large vessel territory infarct. No mass lesion, midline shift or mass effect. Mild ventricular prominence related to global parenchymal volume loss without hydrocephalus. No extra-axial fluid collection. Vascular: No hyperdense vessel prior to contrast administration. Calcified atherosclerosis present at skull base. Following contrast administration, normal intravascular enhancement seen throughout the intracranial circulation. Major dural sinuses appear grossly patent. Skull: Again seen is diffuse soft tissue thickening and irregularity involving the right frontoparietal scalp, concerning for infection/cellulitis. Since previous exam, the superficial aspect of the area involved appears somewhat more eroded as compared to previous. Suspected fluid collection measuring approximately 3.7 x 0.9 x 4.1 cm seen at the right frontoparietal scalp, suspicious for abscess (series 9, image 36). This abuts the underlying calvarium. The outer table of the calvarium again seen to be somewhat irregular with permeated appearance, suggesting possible associated osteomyelitis. Again, no evidence for intracranial spread of infection. Sinuses/Orbits: Globes and orbital soft tissues within normal limits. Visualized paranasal sinuses are largely clear. Moderate right mastoid effusion, similar to previous. Other: None.  IMPRESSION: 1. Persistent diffuse soft tissue thickening and irregularity involving the right frontoparietal scalp, concerning for infection/cellulitis. Suspected 3-4 cm fluid collection within this region, suspicious for abscess. Irregularity of the underlying outer table of the calvarium suspicious for associated osseous involvement/osteomyelitis. No evidence for intracranial involvement. 2. No other acute  intracranial abnormality. 3. Stable atrophy with moderate chronic microvascular ischemic disease. 4. Moderate right mastoid effusion, similar to previous. Electronically Signed   By: Jeannine Boga M.D.   On: 02/01/2021 23:41    Cardiac Studies:   Echocardiogram 09/14/2020: 1. Left ventricular ejection fraction, by estimation, is 55 to 60%. The  left ventricle has normal function. Left ventricular endocardial border  not optimally defined to evaluate regional wall motion. Left ventricular  diastolic parameters are  indeterminate.  2. Right ventricular systolic function is normal. The right ventricular  size is normal.  3. The mitral valve is normal in structure. Trivial mitral valve  regurgitation. No evidence of mitral stenosis.  4. The aortic valve is normal in structure. Aortic valve regurgitation is  not visualized. No aortic stenosis is present.  5. The inferior vena cava is normal in size with greater than 50%  respiratory variability, suggesting right atrial pressure of 3 mmHg.   EKG:  02/02/2021: Sinus rhythm with second-degree AV block Mobitz type I at a rate of 47 bpm.  Normal axis.  Poor R wave progression, cannot exclude anteroseptal infarct old.  Nonspecific T wave abnormality.  02/01/2021: Sinus rhythm with second-degree AV block Mobitz type I at a rate of 71 bpm.  09/13/2020: Sinus tachycardia with second-degree AV block Mobitz type I at a rate of 115 bpm.  Assessment   1.  Second-degree AV block Mobitz type I, asymptomatic 2.  Nocturnal hypoxia 3.   Hypertension  Recommendations:  Tommy Bauer  is a 80 y.o. African-American male with history of hypertension, hyperlipidemia, alcohol abuse, prostate cancer (2011), and CVA.  Patient denies history of MI, DVT/PE, diabetes.  Who presented to the emergency department with right-sided scalp abscess, and cardiology was subsequently consulted for evaluation of bradycardia.  1.  Second-degree AV block Mobitz type I, asymptomatic Unfortunately cardiac telemetry is not available for review as he is no longer on cardiac monitor since transfer to 6 [address]., however review of serial EKGs reveals second-degree AV block Mobitz type I.  Review of past EKGs shows patient has had Mobitz 1 AV block since at least November 2021, and review of EKG in 2014 shows elevation of PR interval at that time as well.  As telemetry is unavailable for review, unable to determine if Mobitz type I block is intermittent or not.  Review of multiple EKGs reveals it is present both at night and during daytime hours.  Patient is asymptomatic from bradycardia standpoint and hemodynamically stable.  Therefore there is no indication for pacemaker at this time.  Patient is notably confused on exam, however review of the chart shows potential history of underlying dementia.  Do not suspect confusion to be related to underlying cardiac etiology.   Recommend avoiding AV nodal blocking agents.  2.  Nocturnal hypoxia During patient's hospitalization he is also had intermittent nocturnal hypoxia.  Would recommend sleep study evaluation on an outpatient basis to evaluate for underlying sleep apnea.  3.  Hypertension Recommend avoiding AV nodal blocking agents in the treatment of patient's hypertension.  Will defer further management to primary team.  As patient is asymptomatic from a bradycardic standpoint and stable overall from a cardiovascular standpoint we will sign off at this time.  Please reach out for further questions or concerns as  needed.  Patient was seen in collaboration with Dr. Virgina Jock. He also reviewed patient's chart and examined the patient. Dr. Virgina Jock is in agreement of the plan.    Alethia Berthold, PA-C 02/02/2021,  1:00 PM Office: [phone]

## 2021-02-02 NOTE — ED Notes (Signed)
Admitting Doctor notified of varying HR which drops into 30's intermittently.

## 2021-02-02 NOTE — Care Plan (Signed)
This 80 years old male with PMH significant for prostate cancer in 2011, CVA, hypertension, alcohol abuse, hyperlipidemia, central retinal artery occlusion presents in the ED with right-sided scalp wound for several months.  Initial history was provided by the ED physician and medical record.  Patient was found to be bradycardic while sleeping in the ED.  CT head showed persistent diffuse soft tissue thickening and irregularity involving the right frontoparietal scalp concerning for infection cellulitis/ osteomyelitis .  Suspected 3 to 4 cm fluid collection within this region suspicious for abscess.  Patient underwent incision and drainage by the ED physician.  Patient was started on IV vancomycin and Zosyn.  Cardiology was consulted for bradycardia,  found to have second-degree AV block Mobitz type I.  Patient remains asymptomatic.  Cardiology recommended avoiding AV nodal medications.  Patient was seen and examined at bedside.

## 2021-02-02 NOTE — H&P (Signed)
History and Physical  Ringo Sherod NOM:[id number] DOB: [birth year] DOA: 02/01/2021  Referring physician: Christ Kick, MD PCP: [name], NP  Patient coming from: Nursing care facility  Chief Complaint: Scalp wound  HPI: Shuayb Schepers is a 80 y.o. male with medical history significant for  prostate cancer in 2011, CVA, hypertension, alcohol use, hyperlipidemia, central retinal artery occlusion who presents to the emergency department due to several months of right-sided scalp wound.  Patient was unable to provide history possibly due to an underlying dementia, history was provided by ED physician and ED medical record.  Per report patient has had a right-sided scalp wound which was recently evaluated and CT head on 3/21 showed:  Right frontoparietal scalp soft tissue swelling with irregularity of the overlying skin surface. Suspected abscess formation measuring 3 cm. Irregularity of the adjacent outer table of the right parietal calvarium could reflect osseous extension. No evidence of intracranial involvement. Patient does not appear to be on any antibiotic treatment for this.  No reported history of fever, chills or any focal neurological deficit.  ED Course:  In the emergency department, he was noted to be bradycardic when he goes to sleep.  He was also intermittently tachypneic.  Other vital signs were within normal range.  Work-up in the ED showed normocytic anemia and normal BMP except for mild hyperglycemia.  Lactic acid was 1.1. CT of head without contrast showed: Persistent diffuse soft tissue thickening and irregularity involving the right frontoparietal scalp, concerning for infection/cellulitis. Suspected 3-4 cm fluid collection within this region, suspicious for abscess. Irregularity of the underlying outer table of the calvarium suspicious for associated osseous involvement/osteomyelitis. No evidence for intracranial involvement.  Patient was started on IV Vanco and Zosyn.   Hospitalist was asked to admit patient for further evaluation and management.  Review of Systems:  This cannot be obtained at this time due to patient's current condition   Past Medical History:  Diagnosis Date  . ADENOCARCINOMA, PROSTATE 05/19/2010  . BACK PAIN 07/16/2008  . Carpal tunnel syndrome 10/15/2008  . COLONIC POLYPS, HX OF 06/08/2007  . Contusion    left shoulder,left knee,s/p recent fall end of march 2013  . CVA (cerebral vascular accident) ([place])   . ERECTILE DYSFUNCTION, ORGANIC 07/11/2007  . Headache(784.0) 07/11/2007  . HYPERTENSION 06/08/2007  . INGUINAL HERNIA 12/04/2007  . LEFT VENTRICULAR FUNCTION, DECREASED 11/12/2007  . Prostate cancer ([place]) 09/14/2009   prostateectomy,adenocarcinoma,gleason=4+3=7  . PROSTATE SPECIFIC ANTIGEN, ELEVATED 05/06/2009  . TOBACCO ABUSE 11/06/2009   Past Surgical History:  Procedure Laterality Date  . HERNIA REPAIR     right ingunial  . PROSTATE SURGERY  09/14/2009   radical prostatectomy /gleason=4+3-7    Social History:  reports that he has quit smoking. His smoking use included cigarettes. He quit after 60.00 years of use. He has never used smokeless tobacco. He reports previous alcohol use. He reports that he does not use drugs.   No Known Allergies  Family History  Problem Relation Age of Onset  . Colon cancer Neg Hx      Prior to Admission medications   Medication Sig Start Date End Date Taking? Authorizing Provider  acetaminophen (TYLENOL) 500 MG tablet Take 500 mg by mouth every 4 (four) hours as needed for moderate pain.    Yes [provider]  ammonium lactate (AMLACTIN) 12 % cream Apply topically at bedtime. Apply to lower legs and feet   Yes [provider]  aspirin 81 MG tablet Take 1 tablet (81 mg total)  by mouth daily. 07/15/15  Yes Marletta Lor, MD  atorvastatin (LIPITOR) 40 MG tablet Take 1 tablet (40 mg total) by mouth daily. 07/30/19  Yes Nafziger, Tommi Rumps, NP  bimatoprost (LUMIGAN) 0.03 %  ophthalmic solution Place 1 drop into both eyes at bedtime.   Yes [provider]  Diclofenac Sodium 3 % GEL Apply 1 application topically every 8 (eight) hours as needed (knee pain).   Yes [provider]  Diclofenac Sodium 3 % GEL Apply 1 application topically See admin instructions. To both knees bid x 4 days   Yes [provider]  folic acid (FOLVITE) 1 MG tablet Take 1 mg by mouth daily.   Yes [provider]  furosemide (LASIX) 40 MG tablet Take 40 mg by mouth.   Yes [provider]  [employer] [facility] EX) Apply 1 application topically daily. Apply to scrotum and sacrum   Yes [provider]  metoprolol succinate (TOPROL-XL) 100 MG 24 hr tablet TAKE ONE TABLET BY MOUTH ONCE DAILY TAKE  WITH  OR  IMMEDIATELY  FOLLOWING  A  MEAL Patient taking differently: Take 100 mg by mouth daily. 11/09/18  Yes Nafziger, Tommi Rumps, NP  Multiple Vitamin (MULTIVITAMIN) tablet Take 1 tablet by mouth daily.   Yes [provider]  potassium chloride (KLOR-CON) 20 MEQ packet Take 40 mEq by mouth daily. When taking lasix   Yes [provider]  Skin Protectants, Misc. (EUCERIN) cream Apply 1 application topically daily. Apply to extremities,trunk and back after bath   Yes [provider]  thiamine 100 MG tablet Take 100 mg by mouth daily.   Yes [provider]    Physical Exam: BP (!) 155/73   Pulse (!) 33   Temp 97.8 F (36.6 C) (Oral)   Resp (!) 21   Ht 6' (1.829 m)   Wt 90.7 kg   SpO2 95%   BMI 27.12 kg/m   . General: 81 y.o. year-old male well developed well nourished in no acute distress.  Alert and oriented x3. Marland Kitchen HEENT: Right frontal scalp with draining purulent wound.  EOMI . Cardiovascular: Regular rate and rhythm with no rubs or gallops.  No thyromegaly or JVD noted.  No lower extremity edema. 2/4 pulses in all 4 extremities. Marland Kitchen Respiratory: Clear to auscultation with no wheezes or rales. Good  inspiratory effort. . Abdomen: Soft nontender nondistended with normal bowel sounds x4 quadrants. . Muskuloskeletal: No cyanosis, clubbing or edema noted bilaterally . Neuro: CN II-XII i grossly intact.  Sensation, reflexes intact . Skin: No ulcerative lesions noted or rashes . Psychiatry: Mood is appropriate for condition and setting          Labs on Admission:  Basic Metabolic Panel: Recent Labs  Lab 02/01/21 1856  NA 137  K 4.0  CL 104  CO2 28  GLUCOSE 111*  BUN 20  CREATININE 1.03  CALCIUM 9.3   Liver Function Tests: No results for input(s): AST, ALT, ALKPHOS, BILITOT, PROT, ALBUMIN in the last 168 hours. No results for input(s): LIPASE, AMYLASE in the last 168 hours. No results for input(s): AMMONIA in the last 168 hours. CBC: Recent Labs  Lab 02/01/21 1856  WBC 6.1  NEUTROABS 3.6  HGB 12.8*  HCT 42.2  MCV 90.8  PLT 284   Cardiac Enzymes: No results for input(s): CKTOTAL, CKMB, CKMBINDEX, TROPONINI in the last 168 hours.  BNP (last 3 results) No results for input(s): BNP in the last 8760 hours.  ProBNP (last  3 results) No results for input(s): PROBNP in the last 8760 hours.  CBG: No results for input(s): GLUCAP in the last 168 hours.  Radiological Exams on Admission: CT Head W or Wo Contrast  Result Date: 02/01/2021 CLINICAL DATA:  Initial evaluation for cellulitis of right scalp. EXAM: CT HEAD WITHOUT AND WITH CONTRAST TECHNIQUE: Contiguous axial images were obtained from the base of the skull through the vertex without and with intravenous contrast CONTRAST:  162mL OMNIPAQUE IOHEXOL 350 MG/ML SOLN COMPARISON:  Prior head CT from 01/18/2021. FINDINGS: Brain: Generalized age-related cerebral atrophy with moderate chronic microvascular ischemic disease, stable. No acute intracranial hemorrhage. No acute large vessel territory infarct. No mass lesion, midline shift or mass effect. Mild ventricular prominence related to global parenchymal volume loss without  hydrocephalus. No extra-axial fluid collection. Vascular: No hyperdense vessel prior to contrast administration. Calcified atherosclerosis present at skull base. Following contrast administration, normal intravascular enhancement seen throughout the intracranial circulation. Major dural sinuses appear grossly patent. Skull: Again seen is diffuse soft tissue thickening and irregularity involving the right frontoparietal scalp, concerning for infection/cellulitis. Since previous exam, the superficial aspect of the area involved appears somewhat more eroded as compared to previous. Suspected fluid collection measuring approximately 3.7 x 0.9 x 4.1 cm seen at the right frontoparietal scalp, suspicious for abscess (series 9, image 36). This abuts the underlying calvarium. The outer table of the calvarium again seen to be somewhat irregular with permeated appearance, suggesting possible associated osteomyelitis. Again, no evidence for intracranial spread of infection. Sinuses/Orbits: Globes and orbital soft tissues within normal limits. Visualized paranasal sinuses are largely clear. Moderate right mastoid effusion, similar to previous. Other: None. IMPRESSION: 1. Persistent diffuse soft tissue thickening and irregularity involving the right frontoparietal scalp, concerning for infection/cellulitis. Suspected 3-4 cm fluid collection within this region, suspicious for abscess. Irregularity of the underlying outer table of the calvarium suspicious for associated osseous involvement/osteomyelitis. No evidence for intracranial involvement. 2. No other acute intracranial abnormality. 3. Stable atrophy with moderate chronic microvascular ischemic disease. 4. Moderate right mastoid effusion, similar to previous. Electronically Signed   By: Jeannine Boga M.D.   On: 02/01/2021 23:41    EKG: I independently viewed the EKG done and my findings are as followed: Normal sinus rhythm at a rate of 71 bpm with APCs and prolonged  QTc at 495 ms  Assessment/Plan Present on Admission: . Scalp wound . Essential hypertension  Principal Problem:   Scalp wound Active Problems:   Essential hypertension   Abscess or cellulitis of scalp   Osteomyelitis (HCC)   Bradycardia   Hypoxia, sleep related   Prolonged QT interval   Hyperlipidemia   History of stroke  Scalp wound with cellulitis/abscess and possible osteomyelitis CT of head without contrast showed cellulitis/abscess and possible cellulitis Patient was started on IV Vanco and Zosyn, we shall continue with IV vancomycin Wound was debrided by ED physician Continue wound care  Hypoxia sleep-related Patient was reported to become hypoxic when sleeping, supplemental oxygen was provided It is unknown at this time if patient has sleep apnea, Patient may require outpatient sleep study  Bradycardia-sleep-related HR decreased to low 30s when asleep Continue to monitor patient Consider cardiology consult prior to discharge  Prolonged QTc (495 ms) Avoid QT prolonging drugs Magnesium level will be checked Repeat EKG in the morning  Essential hypertension Continue metoprolol  Hyperlipidemia Continue Lipitor  History of stroke Continue aspirin and Lipitor  History of alcohol abuse Continue folic acid, multivitamin and thiamine  DVT prophylaxis: Lovenox  Code Status: Full code  Family Communication: None at bedside  Disposition Plan:  Patient is from:                        home Anticipated DC to:                   SNF or family members home Anticipated DC date:               2-3 days Anticipated DC barriers:           Patient is unstable to be discharged at this time due to scalp cellulitis/abscess   Consults called: None  Admission status: Inpatient  [name] MD [facility]  02/02/2021, 7:37 AM

## 2021-02-03 DIAGNOSIS — S0100XA Unspecified open wound of scalp, initial encounter: Secondary | ICD-10-CM | POA: Diagnosis not present

## 2021-02-03 LAB — COMPREHENSIVE METABOLIC PANEL
ALT: 16 U/L (ref 0–44)
AST: 14 U/L — ABNORMAL LOW (ref 15–41)
Albumin: 2.9 g/dL — ABNORMAL LOW (ref 3.5–5.0)
Alkaline Phosphatase: 53 U/L (ref 38–126)
Anion gap: 4 — ABNORMAL LOW (ref 5–15)
BUN: 14 mg/dL (ref 8–23)
CO2: 28 mmol/L (ref 22–32)
Calcium: 9 mg/dL (ref 8.9–10.3)
Chloride: 107 mmol/L (ref 98–111)
Creatinine, Ser: 1.07 mg/dL (ref 0.61–1.24)
GFR, Estimated: >60 mL/min (ref >60)
Glucose, Bld: 87 mg/dL (ref 70–99)
Potassium: 3.9 mmol/L (ref 3.5–5.1)
Sodium: 139 mmol/L (ref 135–145)
Total Bilirubin: 0.9 mg/dL (ref 0.3–1.2)
Total Protein: 7.4 g/dL (ref 6.5–8.1)

## 2021-02-03 LAB — CBC
HCT: 38.6 % — ABNORMAL LOW (ref 39.0–52.0)
Hemoglobin: 12 g/dL — ABNORMAL LOW (ref 13.0–17.0)
MCH: 27.5 pg (ref 26.0–34.0)
MCHC: 31.1 g/dL (ref 30.0–36.0)
MCV: 88.3 fL (ref 80.0–100.0)
Platelets: 261 10*3/uL (ref 150–400)
RBC: 4.37 MIL/uL (ref 4.22–5.81)
RDW: 15.9 % — ABNORMAL HIGH (ref 11.5–15.5)
WBC: 6 10*3/uL (ref 4.0–10.5)
nRBC: 0 % (ref 0.0–0.2)

## 2021-02-03 LAB — PROTIME-INR
INR: 1.3 — ABNORMAL HIGH (ref 0.8–1.2)
Prothrombin Time: 15.3 seconds — ABNORMAL HIGH (ref 11.4–15.2)

## 2021-02-03 LAB — MAGNESIUM: Magnesium: 1.9 mg/dL (ref 1.7–2.4)

## 2021-02-03 LAB — APTT: aPTT: 37 seconds — ABNORMAL HIGH (ref 24–36)

## 2021-02-03 LAB — PHOSPHORUS: Phosphorus: 3.6 mg/dL (ref 2.5–4.6)

## 2021-02-03 NOTE — TOC Initial Note (Signed)
Transition of Care [facility]) - Initial/Assessment Note    Patient Details  Name: Tommy Bauer MRN: [id number] Date of Birth: 18-Apr-1941  Transition of Care [facility]) CM/SW Contact:    Marilu Favre, RN Phone Number: 02/03/2021, 11:48 AM  Clinical Narrative:                 Spoke to patient and ex wife [name] at bedside. Patient is from [facility] long term. Confirmed with Juliann Pulse at [facility]. Patient can return when ready.  Patient face sheet information is Ann's address and phone numbers.   Expected Discharge Plan: Skilled Nursing Facility Barriers to Discharge: Continued Medical Work up   Patient Goals and CMS Choice        Expected Discharge Plan and Services Expected Discharge Plan: [place] arrangements for the past 2 months: [place]                 DME Arranged: N/A DME Agency: NA       HH Arranged: NA          Prior Living Arrangements/Services Living arrangements for the past 2 months: [place]   Patient language and need for interpreter reviewed:: Yes              Criminal Activity/Legal Involvement Pertinent to Current Situation/Hospitalization: No - Comment as needed  Activities of Daily Living      Permission Sought/Granted   Permission granted to share information with : Yes, Verbal Permission Granted  Share Information with NAME: [name]           Emotional Assessment Appearance:: Appears stated age     Orientation: : Oriented to Self Alcohol / Substance Use: Not Applicable Psych Involvement: No (comment)  Admission diagnosis:  Wound infection [T14.8XXA, L08.9] Scalp wound [S01.00XA] Osteomyelitis, unspecified site, unspecified type [facility]) [M86.9] Patient Active Problem List   Diagnosis Date Noted  . Scalp wound 02/02/2021  . Abscess or cellulitis of scalp 02/02/2021  . Osteomyelitis ([place]) 02/02/2021  . Bradycardia 02/02/2021  . Hypoxia, sleep related  02/02/2021  . Prolonged QT interval 02/02/2021  . Hyperlipidemia 02/02/2021  . History of stroke 02/02/2021  . Atrioventricular block, Mobitz type 1, Wenckebach   . Memory loss 12/14/2020  . Gait abnormality 12/14/2020  . Abnormal EEG 12/14/2020  . Hypothermia 09/17/2020  . AMS (altered mental status) 09/13/2020  . AKI (acute kidney injury) ([place]) 09/13/2020  . Conjunctivitis 09/13/2020  . Rhabdomyolysis 09/13/2020  . Central retinal artery occlusion 07/15/2015  . 80 year-old gentleman s/p radical prostatectomy for pT3a adenocarcinoma with a Gleason's Score of 4+3, with extracapsular extension noted on pathology, and rising PSA of 0.36 05/19/2010  . TOBACCO ABUSE 11/06/2009  . CARPAL TUNNEL SYNDROME 10/15/2008  . BACK PAIN 07/16/2008  . INGUINAL HERNIA 12/04/2007  . Cardiovascular disease 11/12/2007  . ERECTILE DYSFUNCTION, ORGANIC 07/11/2007  . HEADACHE 07/11/2007  . Essential hypertension 06/08/2007  . History of colonic polyps 06/08/2007   PCP:  [name], NP Pharmacy:   [facility] [place], [place] - [address] AT [address] [place] [address] Phone: [phone] Fax: [phone]     Social Determinants of Health (SDOH) Interventions    Readmission Risk Interventions No flowsheet data found.

## 2021-02-03 NOTE — Progress Notes (Addendum)
PROGRESS NOTE    Tommy Bauer  KWI:[id number] DOB: 02-01-1941 DOA: 02/01/2021 PCP: [name], NP    Brief Narrative:  This 80 years old male with PMH significant for prostate cancer in 2011, CVA, hypertension, alcohol abuse, hyperlipidemia, central retinal artery occlusion presents in the ED with right-sided scalp wound for several months.  Initial history was provided by the ED physician and medical record.  Patient was found to be bradycardic while sleeping in the ED.  CT head showed persistent diffuse soft tissue thickening and irregularity involving the right frontoparietal scalp concerning for infection cellulitis/ osteomyelitis .  Suspected 3 to 4 cm fluid collection within this region suspicious for abscess.  Patient underwent incision and drainage by the ED physician.  Patient was started on IV vancomycin and Zosyn. Cardiology was consulted for bradycardia,  found to have second-degree AV block Mobitz type I.  Patient remains asymptomatic.  Cardiology recommended avoiding AV nodal medications.  Assessment & Plan:   Principal Problem:   Scalp wound Active Problems:   Essential hypertension   Abscess or cellulitis of scalp   Osteomyelitis (HCC)   Bradycardia   Hypoxia, sleep related   Prolonged QT interval   Hyperlipidemia   History of stroke   Atrioventricular block, Mobitz type 1, Wenckebach   Scalp wound with cellulitis/abscess and possible osteomyelitis: CT of head without contrast showed cellulitis/abscess and possible cellulitis Patient was started on IV Vanco and Zosyn, we shall continue with IV vancomycin Wound was debrided by ED physician Continue wound care. Zosyn changed to ceftriaxone.  Continue Vanco and ceftriaxone. Consider infectious disease and general surgical consult.  Hypoxia sleep-related: Patient was reported to become hypoxic when sleeping, supplemental oxygen was provided It is unknown at this time if patient has sleep apnea, Patient may require  outpatient sleep study  Bradycardia-sleep-related: HR decreased to low 30s when asleep Continue to monitor patient. Cardiology consulted.,  Patient is having second-degree AV block Mobitz type I.   Patient remained asymptomatic.  Cardiology recommended avoiding AV nodal medications.  Prolonged QTc (495 ms) Avoid QT prolonging drugs Magnesium normal. Repeat EKG in the morning  Essential hypertension Continue metoprolol  Hyperlipidemia Continue Lipitor  History of stroke Continue aspirin and Lipitor  History of alcohol abuse Continue folic acid, multivitamin and thiamine   DVT prophylaxis: Lovenox  code Status: Full code Family Communication: No family at bedside Disposition Plan:    Status is: Inpatient  Remains inpatient appropriate because:Inpatient level of care appropriate due to severity of illness   Dispo: The patient is from: Home              Anticipated d/c is to: Home              Patient currently is not medically stable to d/c.   Difficult to place patient No   Consultants:   General surgery  Procedures: Incision and drainage of a scalp abscess Antimicrobials:  Anti-infectives (From admission, onward)   Start     Dose/Rate Route Frequency Ordered Stop   02/02/21 1900  vancomycin (VANCOCIN) 1,750 mg in sodium chloride 0.9 % 500 mL IVPB  Status:  Discontinued        1,750 mg 250 mL/hr over 120 Minutes Intravenous Every 24 hours 02/01/21 2155 02/02/21 0729   02/02/21 1100  cefTRIAXone (ROCEPHIN) 2 g in sodium chloride 0.9 % 100 mL IVPB        2 g 200 mL/hr over 30 Minutes Intravenous Every 12 hours 02/02/21 0733  02/02/21 0830  vancomycin (VANCOREADY) IVPB 1250 mg/250 mL        1,250 mg 166.7 mL/hr over 90 Minutes Intravenous Every 12 hours 02/02/21 0729     02/02/21 0230  piperacillin-tazobactam (ZOSYN) IVPB 3.375 g  Status:  Discontinued        3.375 g 12.5 mL/hr over 240 Minutes Intravenous Every 8 hours 02/01/21 2155 02/02/21 0733    02/01/21 1915  piperacillin-tazobactam (ZOSYN) IVPB 3.375 g        3.375 g 12.5 mL/hr over 240 Minutes Intravenous Once 02/01/21 1914 02/02/21 0225   02/01/21 1915  vancomycin (VANCOREADY) IVPB 1500 mg/300 mL        1,500 mg 150 mL/hr over 120 Minutes Intravenous  Once 02/01/21 1914 02/02/21 0003      Subjective: Patient was seen and examined at bedside.  Overnight events noted.   Patient reports feeling better,  Patient has wound on right side of scalp, still draining.  Objective: Vitals:   02/02/21 2153 02/03/21 0026 02/03/21 0557 02/03/21 1537  BP: (!) 163/71 (!) 145/69 131/77 (!) 147/85  Pulse: 76 69 68 77  Resp: 19 17 15 16   Temp: 98.7 F (37.1 C) 98.4 F (36.9 C) 98.6 F (37 C) (!) 97.5 F (36.4 C)  TempSrc: Oral Oral Oral Axillary  SpO2: 100% 100% 96% 100%  Weight:      Height:        Intake/Output Summary (Last 24 hours) at 02/03/2021 1615 Last data filed at 02/03/2021 1100 Gross per 24 hour  Intake 1050 ml  Output 800 ml  Net 250 ml   Filed Weights   02/02/21 0700  Weight: 90.7 kg    Examination:  General exam: Appears calm and comfortable, not in any acute distress. Respiratory system: Clear to auscultation. Respiratory effort normal. Cardiovascular system: S1 & S2 heard, RRR. No JVD, murmurs, rubs, gallops or clicks. No pedal edema. Gastrointestinal system: Abdomen is nondistended, soft and nontender. No organomegaly or masses felt.  Normal bowel sounds heard. Central nervous system: Alert and oriented. No focal neurological deficits. Extremities: Symmetric 5 x 5 power.  No edema, no cyanosis, no clubbing. Skin: Chronic wound on the right scalp,  slight drainage. Psychiatry: Judgement and insight appear normal. Mood & affect appropriate.     Data Reviewed: I have personally reviewed following labs and imaging studies  CBC: Recent Labs  Lab 02/01/21 1856 02/02/21 0802 02/03/21 0149  WBC 6.1 5.5 6.0  NEUTROABS 3.6  --   --   HGB 12.8* 13.0 12.0*   HCT 42.2 44.5 38.6*  MCV 90.8 90.4 88.3  PLT 284 255 321   Basic Metabolic Panel: Recent Labs  Lab 02/01/21 1856 02/02/21 0802 02/03/21 0149  NA 137  --  139  K 4.0  --  3.9  CL 104  --  107  CO2 28  --  28  GLUCOSE 111*  --  87  BUN 20  --  14  CREATININE 1.03 0.97 1.07  CALCIUM 9.3  --  9.0  MG  --   --  1.9  PHOS  --   --  3.6   GFR: Estimated Creatinine Clearance: 60.4 mL/min (by C-G formula based on SCr of 1.07 mg/dL). Liver Function Tests: Recent Labs  Lab 02/03/21 0149  AST 14*  ALT 16  ALKPHOS 53  BILITOT 0.9  PROT 7.4  ALBUMIN 2.9*   No results for input(s): LIPASE, AMYLASE in the last 168 hours. No results for input(s): AMMONIA in  the last 168 hours. Coagulation Profile: Recent Labs  Lab 02/03/21 0149  INR 1.3*   Cardiac Enzymes: No results for input(s): CKTOTAL, CKMB, CKMBINDEX, TROPONINI in the last 168 hours. BNP (last 3 results) No results for input(s): PROBNP in the last 8760 hours. HbA1C: No results for input(s): HGBA1C in the last 72 hours. CBG: No results for input(s): GLUCAP in the last 168 hours. Lipid Profile: No results for input(s): CHOL, HDL, LDLCALC, TRIG, CHOLHDL, LDLDIRECT in the last 72 hours. Thyroid Function Tests: No results for input(s): TSH, T4TOTAL, FREET4, T3FREE, THYROIDAB in the last 72 hours. Anemia Panel: No results for input(s): VITAMINB12, FOLATE, FERRITIN, TIBC, IRON, RETICCTPCT in the last 72 hours. Sepsis Labs: Recent Labs  Lab 02/01/21 2007  LATICACIDVEN 1.1    Recent Results (from the past 240 hour(s))  Culture, blood (routine x 2)     Status: None (Preliminary result)   Collection Time: 02/01/21  8:07 PM   Specimen: BLOOD  Result Value Ref Range Status   Specimen Description BLOOD LEFT ANTECUBITAL  Final   Special Requests   Final    BOTTLES DRAWN AEROBIC AND ANAEROBIC Blood Culture results may not be optimal due to an inadequate volume of blood received in culture bottles   Culture   Final    NO  GROWTH 2 DAYS Performed at [facility] [address], [place], [place] [address]    Report Status PENDING  Incomplete  Culture, blood (routine x 2)     Status: None (Preliminary result)   Collection Time: 02/01/21  9:09 PM   Specimen: BLOOD RIGHT HAND  Result Value Ref Range Status   Specimen Description BLOOD RIGHT HAND  Final   Special Requests   Final    BOTTLES DRAWN AEROBIC AND ANAEROBIC Blood Culture results may not be optimal due to an inadequate volume of blood received in culture bottles   Culture   Final    NO GROWTH 2 DAYS Performed at [facility], [place] [address] [place], [place] [address]    Report Status PENDING  Incomplete  SARS CORONAVIRUS 2 (TAT 6-24 HRS) Nasopharyngeal Nasopharyngeal Swab     Status: None   Collection Time: 02/02/21 12:09 AM   Specimen: Nasopharyngeal Swab  Result Value Ref Range Status   SARS Coronavirus 2 NEGATIVE NEGATIVE Final    Comment: (NOTE) SARS-CoV-2 target nucleic acids are NOT DETECTED.  The SARS-CoV-2 RNA is generally detectable in upper and lower respiratory specimens during the acute phase of infection. Negative results do not preclude SARS-CoV-2 infection, do not rule out co-infections with other pathogens, and should not be used as the sole basis for treatment or other patient management decisions. Negative results must be combined with clinical observations, patient history, and epidemiological information. The expected result is Negative.  Fact Sheet for Patients: SugarRoll.be  Fact Sheet for Healthcare Providers: https://www.woods-mathews.com/  This test is not yet approved or cleared by the [place] FDA and  has been authorized for detection and/or diagnosis of SARS-CoV-2 by FDA under an Emergency Use Authorization (EUA). This EUA will remain  in effect (meaning this test can be used) for the duration of the COVID-19 declaration under Se ction 564(b)(1) of  the Act, 21 U.S.C. section 360bbb-3(b)(1), unless the authorization is terminated or revoked sooner.  Performed at [facility], [place] [address] [place], [place] [address]    Radiology Studies: CT Head W or Wo Contrast  Result Date: 02/01/2021 CLINICAL DATA:  Initial  evaluation for cellulitis of right scalp. EXAM: CT HEAD WITHOUT AND WITH CONTRAST TECHNIQUE: Contiguous axial images were obtained from the base of the skull through the vertex without and with intravenous contrast CONTRAST:  158mL OMNIPAQUE IOHEXOL 350 MG/ML SOLN COMPARISON:  Prior head CT from 01/18/2021. FINDINGS: Brain: Generalized age-related cerebral atrophy with moderate chronic microvascular ischemic disease, stable. No acute intracranial hemorrhage. No acute large vessel territory infarct. No mass lesion, midline shift or mass effect. Mild ventricular prominence related to global parenchymal volume loss without hydrocephalus. No extra-axial fluid collection. Vascular: No hyperdense vessel prior to contrast administration. Calcified atherosclerosis present at skull base. Following contrast administration, normal intravascular enhancement seen throughout the intracranial circulation. Major dural sinuses appear grossly patent. Skull: Again seen is diffuse soft tissue thickening and irregularity involving the right frontoparietal scalp, concerning for infection/cellulitis. Since previous exam, the superficial aspect of the area involved appears somewhat more eroded as compared to previous. Suspected fluid collection measuring approximately 3.7 x 0.9 x 4.1 cm seen at the right frontoparietal scalp, suspicious for abscess (series 9, image 36). This abuts the underlying calvarium. The outer table of the calvarium again seen to be somewhat irregular with permeated appearance, suggesting possible associated osteomyelitis. Again, no evidence for intracranial spread of infection. Sinuses/Orbits: Globes and orbital soft tissues within  normal limits. Visualized paranasal sinuses are largely clear. Moderate right mastoid effusion, similar to previous. Other: None. IMPRESSION: 1. Persistent diffuse soft tissue thickening and irregularity involving the right frontoparietal scalp, concerning for infection/cellulitis. Suspected 3-4 cm fluid collection within this region, suspicious for abscess. Irregularity of the underlying outer table of the calvarium suspicious for associated osseous involvement/osteomyelitis. No evidence for intracranial involvement. 2. No other acute intracranial abnormality. 3. Stable atrophy with moderate chronic microvascular ischemic disease. 4. Moderate right mastoid effusion, similar to previous. Electronically Signed   By: Jeannine Boga M.D.   On: 02/01/2021 23:41   Scheduled Meds: . aspirin  81 mg Oral Daily  . atorvastatin  40 mg Oral Daily  . enoxaparin (LOVENOX) injection  40 mg Subcutaneous Q24H  . folic acid  1 mg Oral Daily  . multivitamin with minerals  1 tablet Oral Daily  . thiamine  100 mg Oral Daily   Continuous Infusions: . cefTRIAXone (ROCEPHIN)  IV 2 g (02/03/21 1051)  . vancomycin 1,250 mg (02/03/21 0833)     LOS: 1 day    Time spent: 35 mins    Franciso Dierks, MD [facility]   If 7PM-7AM, please contact night-coverage

## 2021-02-04 DIAGNOSIS — L03811 Cellulitis of head [any part, except face]: Secondary | ICD-10-CM

## 2021-02-04 DIAGNOSIS — S0100XA Unspecified open wound of scalp, initial encounter: Secondary | ICD-10-CM

## 2021-02-04 LAB — VANCOMYCIN, TROUGH: Vancomycin Tr: 24 ug/mL (ref 15–20)

## 2021-02-04 MED ORDER — SODIUM CHLORIDE 0.9 % IV SOLN
2.0000 g | INTRAVENOUS | Status: DC
  Administered 2021-02-05 – 2021-02-07 (×3): 2 g via INTRAVENOUS
  Filled 2021-02-04 (×2): qty 2
  Filled 2021-02-04: qty 20

## 2021-02-04 MED ORDER — VANCOMYCIN HCL 10 G IV SOLR
1750.0000 mg | INTRAVENOUS | Status: DC
  Administered 2021-02-04: 1750 mg via INTRAVENOUS
  Filled 2021-02-04 (×2): qty 1750

## 2021-02-04 NOTE — Plan of Care (Signed)
  Problem: Education: Goal: Knowledge of General Education information will improve Description Including pain rating scale, medication(s)/side effects and non-pharmacologic comfort measures Outcome: Progressing   

## 2021-02-04 NOTE — Progress Notes (Signed)
Patient has healed over scabs on inner right leg (2cm by 4 cm), inner left leg (3cm by 7 cm) left knee 1 cm by 1 cm) present upon admission. Patient stated that scarbs were from a previous fall that happened a while ago. Scabs are pink/white and dry.

## 2021-02-04 NOTE — Progress Notes (Signed)
PROGRESS NOTE    Tommy Bauer  TGG:[id number] DOB: [birth year] DOA: 02/01/2021 PCP: [name], NP    Brief Narrative:  This 80 years old male with PMH significant for prostate cancer in 2011, CVA, hypertension, alcohol abuse, hyperlipidemia, central retinal artery occlusion presents in the ED with right-sided scalp wound for several months.  Initial history was provided by the ED physician and medical record.  Patient was found to be bradycardic while sleeping in the ED.  CT head showed persistent diffuse soft tissue thickening and irregularity involving the right frontoparietal scalp concerning for infection cellulitis/ osteomyelitis .  Suspected 3 to 4 cm fluid collection within this region suspicious for abscess.  Patient underwent incision and drainage by the ED physician.  Patient was started on IV vancomycin and Zosyn. Cardiology was consulted for bradycardia,  found to have second-degree AV block Mobitz type I.  Patient remains asymptomatic.  Cardiology recommended avoiding AV nodal medications.  Infectious disease consulted for osteomyelitis,  recommended 6 weeks of IV empiric treatment.  Assessment & Plan:   Principal Problem:   Scalp wound Active Problems:   Essential hypertension   Abscess or cellulitis of scalp   Osteomyelitis (HCC)   Bradycardia   Hypoxia, sleep related   Prolonged QT interval   Hyperlipidemia   History of stroke   Atrioventricular block, Mobitz type 1, Wenckebach   Scalp wound with cellulitis/abscess and possible osteomyelitis: CT of head without contrast showed cellulitis/abscess and possible cellulitis Patient was started on IV Vanco and Zosyn, we shall continue with IV vancomycin Wound was debrided by ED physician Continue wound care. Zosyn changed to ceftriaxone.  Continue Vanco and ceftriaxone. Infectious disease consulted recommended 6 weeks of IV empiric treatment vancomycin and ceftriaxone. Orthopedic consulted, recommended to consult  neurosurgery. No surgery recommended continue IV antibiotics since there is no intracranial involvement,  No neurosurgical intervention needed at this time. Patient might need plastic surgery involvement.  Hypoxia sleep-related: Patient was reported to become hypoxic when sleeping, supplemental oxygen was provided It is unknown at this time if patient has sleep apnea, Patient may require outpatient sleep study  Bradycardia-sleep-related: HR decreased to low 30s when asleep Continue to monitor patient. Cardiology consulted.,  Patient is having second-degree AV block Mobitz type I.   Patient remained asymptomatic.  Cardiology recommended avoiding AV nodal medications.  Prolonged QTc (495 ms) Avoid QT prolonging drugs Magnesium normal. Repeat EKG in the morning  Essential hypertension Continue metoprolol  Hyperlipidemia Continue Lipitor  History of stroke Continue aspirin and Lipitor  History of alcohol abuse Continue folic acid, multivitamin and thiamine   DVT prophylaxis: Lovenox  code Status: Full code Family Communication: No family at bedside Disposition Plan:    Status is: Inpatient  Remains inpatient appropriate because:Inpatient level of care appropriate due to severity of illness   Dispo: The patient is from: Home              Anticipated d/c is to: Home              Patient currently is not medically stable to d/c.   Difficult to place patient No   Consultants:   General surgery  Procedures: Incision and drainage of a scalp abscess Antimicrobials:  Anti-infectives (From admission, onward)   Start     Dose/Rate Route Frequency Ordered Stop   02/05/21 1000  cefTRIAXone (ROCEPHIN) 2 g in sodium chloride 0.9 % 100 mL IVPB        2 g 200 mL/hr over 30 Minutes  Intravenous Every 24 hours 02/04/21 1033     02/04/21 1600  vancomycin (VANCOCIN) 1,750 mg in sodium chloride 0.9 % 500 mL IVPB        1,750 mg 250 mL/hr over 120 Minutes Intravenous Every  24 hours 02/04/21 1022     02/02/21 1900  vancomycin (VANCOCIN) 1,750 mg in sodium chloride 0.9 % 500 mL IVPB  Status:  Discontinued        1,750 mg 250 mL/hr over 120 Minutes Intravenous Every 24 hours 02/01/21 2155 02/02/21 0729   02/02/21 1100  cefTRIAXone (ROCEPHIN) 2 g in sodium chloride 0.9 % 100 mL IVPB  Status:  Discontinued        2 g 200 mL/hr over 30 Minutes Intravenous Every 12 hours 02/02/21 0733 02/04/21 1033   02/02/21 0830  vancomycin (VANCOREADY) IVPB 1250 mg/250 mL  Status:  Discontinued        1,250 mg 166.7 mL/hr over 90 Minutes Intravenous Every 12 hours 02/02/21 0729 02/04/21 1022   02/02/21 0230  piperacillin-tazobactam (ZOSYN) IVPB 3.375 g  Status:  Discontinued        3.375 g 12.5 mL/hr over 240 Minutes Intravenous Every 8 hours 02/01/21 2155 02/02/21 0733   02/01/21 1915  piperacillin-tazobactam (ZOSYN) IVPB 3.375 g        3.375 g 12.5 mL/hr over 240 Minutes Intravenous Once 02/01/21 1914 02/02/21 0225   02/01/21 1915  vancomycin (VANCOREADY) IVPB 1500 mg/300 mL        1,500 mg 150 mL/hr over 120 Minutes Intravenous  Once 02/01/21 1914 02/02/21 0003      Subjective: Patient was seen and examined at bedside.  Overnight events noted.   Patient reports feeling better,  Patient has wound on right side of scalp, still draining.  Objective: Vitals:   02/03/21 1537 02/03/21 2103 02/04/21 0554 02/04/21 1548  BP: (!) 147/85 (!) 151/94 132/66 (!) 122/107  Pulse: 77 80 70 72  Resp: 16 16 17 18   Temp: (!) 97.5 F (36.4 C) 98.2 F (36.8 C) 98.1 F (36.7 C)   TempSrc: Axillary Oral Oral   SpO2: 100% 97% 97% 97%  Weight:      Height:        Intake/Output Summary (Last 24 hours) at 02/04/2021 1708 Last data filed at 02/04/2021 0845 Gross per 24 hour  Intake 240 ml  Output 1600 ml  Net -1360 ml   Filed Weights   02/02/21 0700  Weight: 90.7 kg    Examination:  General exam: Appears calm and comfortable, not in any acute distress. Respiratory system: Clear  to auscultation. Respiratory effort normal. Cardiovascular system: S1 & S2 heard, RRR. No JVD, murmurs, rubs, gallops or clicks. No pedal edema. Gastrointestinal system: Abdomen is nondistended, soft and nontender. No organomegaly or masses felt.  Normal bowel sounds heard. Central nervous system: Alert and oriented. No focal neurological deficits. Extremities: Symmetric 5 x 5 power.  No edema, no cyanosis, no clubbing. Skin: Chronic wound on the right scalp,  slight drainage. Psychiatry: Judgement and insight appear normal. Mood & affect appropriate.     Data Reviewed: I have personally reviewed following labs and imaging studies  CBC: Recent Labs  Lab 02/01/21 1856 02/02/21 0802 02/03/21 0149  WBC 6.1 5.5 6.0  NEUTROABS 3.6  --   --   HGB 12.8* 13.0 12.0*  HCT 42.2 44.5 38.6*  MCV 90.8 90.4 88.3  PLT 284 255 854   Basic Metabolic Panel: Recent Labs  Lab 02/01/21 1856 02/02/21 0802 02/03/21  0149  NA 137  --  139  K 4.0  --  3.9  CL 104  --  107  CO2 28  --  28  GLUCOSE 111*  --  87  BUN 20  --  14  CREATININE 1.03 0.97 1.07  CALCIUM 9.3  --  9.0  MG  --   --  1.9  PHOS  --   --  3.6   GFR: Estimated Creatinine Clearance: 60.4 mL/min (by C-G formula based on SCr of 1.07 mg/dL). Liver Function Tests: Recent Labs  Lab 02/03/21 0149  AST 14*  ALT 16  ALKPHOS 53  BILITOT 0.9  PROT 7.4  ALBUMIN 2.9*   No results for input(s): LIPASE, AMYLASE in the last 168 hours. No results for input(s): AMMONIA in the last 168 hours. Coagulation Profile: Recent Labs  Lab 02/03/21 0149  INR 1.3*   Cardiac Enzymes: No results for input(s): CKTOTAL, CKMB, CKMBINDEX, TROPONINI in the last 168 hours. BNP (last 3 results) No results for input(s): PROBNP in the last 8760 hours. HbA1C: No results for input(s): HGBA1C in the last 72 hours. CBG: No results for input(s): GLUCAP in the last 168 hours. Lipid Profile: No results for input(s): CHOL, HDL, LDLCALC, TRIG, CHOLHDL,  LDLDIRECT in the last 72 hours. Thyroid Function Tests: No results for input(s): TSH, T4TOTAL, FREET4, T3FREE, THYROIDAB in the last 72 hours. Anemia Panel: No results for input(s): VITAMINB12, FOLATE, FERRITIN, TIBC, IRON, RETICCTPCT in the last 72 hours. Sepsis Labs: Recent Labs  Lab 02/01/21 2007  LATICACIDVEN 1.1    Recent Results (from the past 240 hour(s))  Culture, blood (routine x 2)     Status: None (Preliminary result)   Collection Time: 02/01/21  8:07 PM   Specimen: BLOOD  Result Value Ref Range Status   Specimen Description BLOOD LEFT ANTECUBITAL  Final   Special Requests   Final    BOTTLES DRAWN AEROBIC AND ANAEROBIC Blood Culture results may not be optimal due to an inadequate volume of blood received in culture bottles   Culture   Final    NO GROWTH 3 DAYS Performed at [facility] [address], [place], [place] [address]    Report Status PENDING  Incomplete  Culture, blood (routine x 2)     Status: None (Preliminary result)   Collection Time: 02/01/21  9:09 PM   Specimen: BLOOD RIGHT HAND  Result Value Ref Range Status   Specimen Description BLOOD RIGHT HAND  Final   Special Requests   Final    BOTTLES DRAWN AEROBIC AND ANAEROBIC Blood Culture results may not be optimal due to an inadequate volume of blood received in culture bottles   Culture   Final    NO GROWTH 3 DAYS Performed at [facility], [place] [address] [place], [place] [address]    Report Status PENDING  Incomplete  SARS CORONAVIRUS 2 (TAT 6-24 HRS) Nasopharyngeal Nasopharyngeal Swab     Status: None   Collection Time: 02/02/21 12:09 AM   Specimen: Nasopharyngeal Swab  Result Value Ref Range Status   SARS Coronavirus 2 NEGATIVE NEGATIVE Final    Comment: (NOTE) SARS-CoV-2 target nucleic acids are NOT DETECTED.  The SARS-CoV-2 RNA is generally detectable in upper and lower respiratory specimens during the acute phase of infection. Negative results do not preclude SARS-CoV-2  infection, do not rule out co-infections with other pathogens, and should not be used as the sole basis for treatment or other patient management decisions.  Negative results must be combined with clinical observations, patient history, and epidemiological information. The expected result is Negative.  Fact Sheet for Patients: SugarRoll.be  Fact Sheet for Healthcare Providers: https://www.woods-mathews.com/  This test is not yet approved or cleared by the [place] FDA and  has been authorized for detection and/or diagnosis of SARS-CoV-2 by FDA under an Emergency Use Authorization (EUA). This EUA will remain  in effect (meaning this test can be used) for the duration of the COVID-19 declaration under Se ction 564(b)(1) of the Act, 21 U.S.C. section 360bbb-3(b)(1), unless the authorization is terminated or revoked sooner.  Performed at [facility], [place] [address] [place], [place] [address]    Radiology Studies: No results found. Scheduled Meds: . aspirin  81 mg Oral Daily  . atorvastatin  40 mg Oral Daily  . enoxaparin (LOVENOX) injection  40 mg Subcutaneous Q24H  . folic acid  1 mg Oral Daily  . multivitamin with minerals  1 tablet Oral Daily  . thiamine  100 mg Oral Daily   Continuous Infusions: . [START ON 02/05/2021] cefTRIAXone (ROCEPHIN)  IV    . vancomycin 1,750 mg (02/04/21 1653)     LOS: 2 days    Time spent: 25 mins    Bea Duren, MD [facility]   If 7PM-7AM, please contact night-coverage

## 2021-02-04 NOTE — Consult Note (Signed)
[facility] for Infectious Disease    Date of Admission:  02/01/2021     Total days of antibiotics                Reason for Consult: Osteomyelitis     Referring Provider: Dwyane Dee Primary Care Provider: [name], NP   ASSESSMENT:  Tommy Bauer is an 80 y/o AA male with scalp wound with cellulitis/abscess and possible underlying osteomyelitis of the underlying calvarium. Unclear as to how/when he sustained injury as all he can remember is about 6 months ago. Blood cultures have remained without growth to date. Agree with current antibiotic therapy with Vancomycin and ceftriaxone. Renal function remains stable and most recent vancomycin trough was 24. Will need continued wound care and likely prolonged course of 6 weeks of antibiotics. Continue with broad spectrum coverage of vancomycin and ceftriaxone. Disposition appears to be return to skilled nursing facility.   PLAN:  1. Continue vancomycin and ceftriaxone.  2. Check ESR and CRP 3. Wound care per primary team.  4. Monitor blood cultures for any bacteremia.  5. PICC line if cultures remain negative in the next 24-48 hours.    Principal Problem:   Scalp wound Active Problems:   Essential hypertension   Abscess or cellulitis of scalp   Osteomyelitis (HCC)   Bradycardia   Hypoxia, sleep related   Prolonged QT interval   Hyperlipidemia   History of stroke   Atrioventricular block, Mobitz type 1, Wenckebach   . aspirin  81 mg Oral Daily  . atorvastatin  40 mg Oral Daily  . enoxaparin (LOVENOX) injection  40 mg Subcutaneous Q24H  . folic acid  1 mg Oral Daily  . multivitamin with minerals  1 tablet Oral Daily  . thiamine  100 mg Oral Daily     HPI: Tommy Bauer is a 80 y.o. male with previous medical history as detailed below and significant for CVA, hypertension, and alcohol use admitted from nursing care facility with a right sided scalp wound.   Tommy Bauer sustained a scalp wound to the right side several  months ago. Record review shows a "pressure ulcer of other site unstageable" for care visits on 09/24/20, 10/01/20, and 10/01/20.  Seen by Neurology 12/14/20 and diagnosed with memory loss and gait abnormality.  CT head on 01/18/21 at [facility] showing a 3 cm right frontal parietal abscess with some abnormality of the calvarium possibly indicating osseous extension. There was no intracranial abnormality. No apparent antibiotic treatment done at this time.   CT head on 4/4 with persistent diffuse soft tissue thickening and irregularity involving the right frontoparietal scalp concerning for infection/cellulitis and suspected 3-4 cm fluid collection suspicious for abscess; and irregularity of the underlying calvarium suspicious for associated osseous involvement/osteomyelitis. ED provider performed I&D with removal of nonviable skin avulsion. No cultures were obtained. Blood cultures from 4/4 have been without growth. Started on vancomycin and pip/tazo initially and narrowed to vancomycin and ceftriaxone.    Review of Systems: Review of Systems  Constitutional: Negative for chills, fever and weight loss.  HENT:       Positive for right sided scalp wound.   Respiratory: Negative for cough, shortness of breath and wheezing.   Cardiovascular: Negative for chest pain and leg swelling.  Gastrointestinal: Negative for abdominal pain, constipation, diarrhea, nausea and vomiting.  Skin: Negative for rash.     Past Medical History:  Diagnosis Date  . ADENOCARCINOMA, PROSTATE 05/19/2010  . BACK PAIN 07/16/2008  .  Carpal tunnel syndrome 10/15/2008  . COLONIC POLYPS, HX OF 06/08/2007  . Contusion    left shoulder,left knee,s/p recent fall end of march 2013  . CVA (cerebral vascular accident) (HCC)   . ERECTILE DYSFUNCTION, ORGANIC 07/11/2007  . Headache(784.0) 07/11/2007  . HYPERTENSION 06/08/2007  . INGUINAL HERNIA 12/04/2007  . LEFT VENTRICULAR FUNCTION, DECREASED 11/12/2007  . Prostate cancer (HCC)  09/14/2009   prostateectomy,adenocarcinoma,gleason=4+3=7  . PROSTATE SPECIFIC ANTIGEN, ELEVATED 05/06/2009  . TOBACCO ABUSE 11/06/2009    Social History   Tobacco Use  . Smoking status: Former Smoker    Years: 60.00    Types: Cigarettes  . Smokeless tobacco: Never Used  Substance Use Topics  . Alcohol use: Not Currently  . Drug use: No    Family History  Problem Relation Age of Onset  . Colon cancer Neg Hx     No Known Allergies  OBJECTIVE: Blood pressure 132/66, pulse 70, temperature 98.1 F (36.7 C), temperature source Oral, resp. rate 17, height 6' (1.829 m), weight 90.7 kg, SpO2 97 %.  Physical Exam Constitutional:      General: He is not in acute distress.    Appearance: He is well-developed.  Cardiovascular:     Rate and Rhythm: Normal rate and regular rhythm.     Heart sounds: Normal heart sounds.  Pulmonary:     Effort: Pulmonary effort is normal.     Breath sounds: Normal breath sounds.  Skin:    General: Skin is warm and dry.  Neurological:     Mental Status: He is alert.  Psychiatric:        Mood and Affect: Mood normal.     Lab Results Lab Results  Component Value Date   WBC 6.0 02/03/2021   HGB 12.0 (L) 02/03/2021   HCT 38.6 (L) 02/03/2021   MCV 88.3 02/03/2021   PLT 261 02/03/2021    Lab Results  Component Value Date   CREATININE 1.07 02/03/2021   BUN 14 02/03/2021   NA 139 02/03/2021   K 3.9 02/03/2021   CL 107 02/03/2021   CO2 28 02/03/2021    Lab Results  Component Value Date   ALT 16 02/03/2021   AST 14 (L) 02/03/2021   ALKPHOS 53 02/03/2021   BILITOT 0.9 02/03/2021     Microbiology: Recent Results (from the past 240 hour(s))  Culture, blood (routine x 2)     Status: None (Preliminary result)   Collection Time: 02/01/21  8:07 PM   Specimen: BLOOD  Result Value Ref Range Status   Specimen Description BLOOD LEFT ANTECUBITAL  Final   Special Requests   Final    BOTTLES DRAWN AEROBIC AND ANAEROBIC Blood Culture results may  not be optimal due to an inadequate volume of blood received in culture bottles   Culture   Final    NO GROWTH 3 DAYS Performed at [facility], [address], [place], [place] [address]    Report Status PENDING  Incomplete  Culture, blood (routine x 2)     Status: None (Preliminary result)   Collection Time: 02/01/21  9:09 PM   Specimen: BLOOD RIGHT HAND  Result Value Ref Range Status   Specimen Description BLOOD RIGHT HAND  Final   Special Requests   Final    BOTTLES DRAWN AEROBIC AND ANAEROBIC Blood Culture results may not be optimal due to an inadequate volume of blood received in culture bottles   Culture   Final    NO GROWTH 3 DAYS  Performed at [facility], [place] [address] [place], [place] [address]    Report Status PENDING  Incomplete  SARS CORONAVIRUS 2 (TAT 6-24 HRS) Nasopharyngeal Nasopharyngeal Swab     Status: None   Collection Time: 02/02/21 12:09 AM   Specimen: Nasopharyngeal Swab  Result Value Ref Range Status   SARS Coronavirus 2 NEGATIVE NEGATIVE Final    Comment: (NOTE) SARS-CoV-2 target nucleic acids are NOT DETECTED.  The SARS-CoV-2 RNA is generally detectable in upper and lower respiratory specimens during the acute phase of infection. Negative results do not preclude SARS-CoV-2 infection, do not rule out co-infections with other pathogens, and should not be used as the sole basis for treatment or other patient management decisions. Negative results must be combined with clinical observations, patient history, and epidemiological information. The expected result is Negative.  Fact Sheet for Patients: SugarRoll.be  Fact Sheet for Healthcare Providers: https://www.woods-mathews.com/  This test is not yet approved or cleared by the [place] FDA and  has been authorized for detection and/or diagnosis of SARS-CoV-2 by FDA under an Emergency Use Authorization (EUA). This EUA will remain  in effect  (meaning this test can be used) for the duration of the COVID-19 declaration under Se ction 564(b)(1) of the Act, 21 U.S.C. section 360bbb-3(b)(1), unless the authorization is terminated or revoked sooner.  Performed at [facility], [place] [address] [place], [place] [address]      Terri Piedra, [facility]  02/04/2021  3:07 PM

## 2021-02-04 NOTE — Progress Notes (Signed)
Date and time results received:02/04/21  0915  Test: Vanc throuh Critical Value: 24  Name of Provider Notified: Shawna Clamp MD  Orders Received? Or Actions Taken?:Made pharmacy aware will hold next dose of vanc until notified.

## 2021-02-04 NOTE — Progress Notes (Signed)
Pharmacy Antibiotic Note  Tommy Bauer is a 80 y.o. male admitted on 02/01/2021 with head abscess - 3cm R parietal abscess noted on 3/21 outpatient CT. Repeat CT head suspected 3-4 cm fluid collection suspicious for abscess and suspicion for osteomyelitis. S/p I&D in ED. Pharmacy has been consulted for vancomycin dosing.  Patient is also on Rocephin.  Renal function stable and vancomycin trough is elevated at 24 mcg/mL (goal 15-20 mcg/mL).  Afebrile, WBC WNL.  Plan: Reduce vanc to 1750mg  IV Q24H Reduce CTX to 2gm IV Q24H  Monitor renal fxn, clinical progress, repeat vanc trough at new Css  Height: 6' (182.9 cm) Weight: 90.7 kg (200 lb) IBW/kg (Calculated) : 77.6  Temp (24hrs), Avg:97.9 F (36.6 C), Min:97.5 F (36.4 C), Max:98.2 F (36.8 C)  Recent Labs  Lab 02/01/21 1856 02/01/21 2007 02/02/21 0802 02/03/21 0149 02/04/21 0807  WBC 6.1  --  5.5 6.0  --   CREATININE 1.03  --  0.97 1.07  --   LATICACIDVEN  --  1.1  --   --   --   VANCOTROUGH  --   --   --   --  24*    Estimated Creatinine Clearance: 60.4 mL/min (by C-G formula based on SCr of 1.07 mg/dL).    No Known Allergies   Zosyn 4/4 >> Vanc 4/4 >>  4/7 VT = 24 mcg/mL on 1250mg  q12 >> decr to 1750mg  q24  4/4 BCx - NGTD  Rozann Holts D. Mina Marble, PharmD, BCPS, [place] 02/04/2021, 10:20 AM

## 2021-02-04 NOTE — Consult Note (Signed)
[facility]       Reason for Consult: skull osteomyelitis    Referring Physician: Dr. Dwyane Dee  Principal Problem:   Scalp wound Active Problems:   Essential hypertension   Abscess or cellulitis of scalp   Osteomyelitis (HCC)   Bradycardia   Hypoxia, sleep related   Prolonged QT interval   Hyperlipidemia   History of stroke   Atrioventricular block, Mobitz type 1, Wenckebach   . aspirin  81 mg Oral Daily  . atorvastatin  40 mg Oral Daily  . enoxaparin (LOVENOX) injection  40 mg Subcutaneous Q24H  . folic acid  1 mg Oral Daily  . multivitamin with minerals  1 tablet Oral Daily  . 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  With findings in the bone, will need 6 weeks of IV treatment with empiric therapy.   Antibiotics: Vancomycin and ceftriaxone  HPI: Tommy Bauer is a 80 y.o. male with a history of prostate cancer, CVA, alcohol abuse recently hospitalized after being found down who comes in with a scalp wound that has progressed over the last several weeks.  He reports he got it after a fall when he scrapped his legs, though did not hit his head, and he felt it developed after that time by touching his head.  He had an abscess that was drained in the ED and CT scan is c/w abscess and osteomyelitis.  He denies scratching or picking at it. No cultures sent from it.    Review of Systems:  Constitutional: negative for fevers and chills Gastrointestinal: negative for nausea and diarrhea Integument/breast: negative for rash All other systems reviewed and are negative    Past Medical History:  Diagnosis Date  . ADENOCARCINOMA, PROSTATE 05/19/2010  . BACK PAIN 07/16/2008  . Carpal tunnel syndrome 10/15/2008  . COLONIC POLYPS, HX OF 06/08/2007  . Contusion    left shoulder,left knee,s/p recent  fall end of march 2013  . CVA (cerebral vascular accident) ([employer])   . ERECTILE DYSFUNCTION, ORGANIC 07/11/2007  . Headache(784.0) 07/11/2007  . HYPERTENSION 06/08/2007  . INGUINAL HERNIA 12/04/2007  . LEFT VENTRICULAR FUNCTION, DECREASED 11/12/2007  . Prostate cancer ([place]) 09/14/2009   prostateectomy,adenocarcinoma,gleason=4+3=7  . PROSTATE SPECIFIC ANTIGEN, ELEVATED 05/06/2009  . TOBACCO ABUSE 11/06/2009    Social History   Tobacco Use  . Smoking status: Former Smoker    Years: 60.00    Types: Cigarettes  . Smokeless tobacco: Never Used  Substance Use Topics  . Alcohol use: Not Currently  . Drug use: No    Family History  Problem Relation Age of Onset  . Colon cancer Neg Hx     No Known Allergies  Physical Exam: Constitutional: in no apparent distress  Vitals:   02/03/21 2103 02/04/21 0554  BP: (!) 151/94 132/66  Pulse: 80 70  Resp: 16 17  Temp: 98.2 F (36.8 C) 98.1 F (36.7 C)  SpO2: 97% 97%   EYES: anicteric ENMT: no thrush; right scalp with open, dry skin defect, no drainage. Cardiovascular: Cor RRR Respiratory: clear; Musculoskeletal: no pedal edema noted Skin: negatives: no rash Neuro: non-focal  Lab Results  Component Value Date   WBC 6.0 02/03/2021   HGB 12.0 (L) 02/03/2021   HCT 38.6 (L) 02/03/2021   MCV 88.3 02/03/2021   PLT 261 02/03/2021  Lab Results  Component Value Date   CREATININE 1.07 02/03/2021   BUN 14 02/03/2021   NA 139 02/03/2021   K 3.9 02/03/2021   CL 107 02/03/2021   CO2 28 02/03/2021    Lab Results  Component Value Date   ALT 16 02/03/2021   AST 14 (L) 02/03/2021   ALKPHOS 53 02/03/2021     Microbiology: Recent Results (from the past 240 hour(s))  Culture, blood (routine x 2)     Status: None (Preliminary result)   Collection Time: 02/01/21  8:07 PM   Specimen: BLOOD  Result Value Ref Range Status   Specimen Description BLOOD LEFT ANTECUBITAL  Final   Special Requests   Final    BOTTLES DRAWN AEROBIC AND ANAEROBIC  Blood Culture results may not be optimal due to an inadequate volume of blood received in culture bottles   Culture   Final    NO GROWTH 3 DAYS Performed at [facility], [place] [address] [place], [place] [address]    Report Status PENDING  Incomplete  Culture, blood (routine x 2)     Status: None (Preliminary result)   Collection Time: 02/01/21  9:09 PM   Specimen: BLOOD RIGHT HAND  Result Value Ref Range Status   Specimen Description BLOOD RIGHT HAND  Final   Special Requests   Final    BOTTLES DRAWN AEROBIC AND ANAEROBIC Blood Culture results may not be optimal due to an inadequate volume of blood received in culture bottles   Culture   Final    NO GROWTH 3 DAYS Performed at [facility], [place] [address], [place], [place] [address]    Report Status PENDING  Incomplete  SARS CORONAVIRUS 2 (TAT 6-24 HRS) Nasopharyngeal Nasopharyngeal Swab     Status: None   Collection Time: 02/02/21 12:09 AM   Specimen: Nasopharyngeal Swab  Result Value Ref Range Status   SARS Coronavirus 2 NEGATIVE NEGATIVE Final    Comment: (NOTE) SARS-CoV-2 target nucleic acids are NOT DETECTED.  The SARS-CoV-2 RNA is generally detectable in upper and lower respiratory specimens during the acute phase of infection. Negative results do not preclude SARS-CoV-2 infection, do not rule out co-infections with other pathogens, and should not be used as the sole basis for treatment or other patient management decisions. Negative results must be combined with clinical observations, patient history, and epidemiological information. The expected result is Negative.  Fact Sheet for Patients: SugarRoll.be  Fact Sheet for Healthcare Providers: https://www.woods-mathews.com/  This test is not yet approved or cleared by the [place] FDA and  has been authorized for detection and/or diagnosis of SARS-CoV-2 by FDA under an Emergency Use Authorization (EUA). This EUA  will remain  in effect (meaning this test can be used) for the duration of the COVID-19 declaration under Se ction 564(b)(1) of the Act, 21 U.S.C. section 360bbb-3(b)(1), unless the authorization is terminated or revoked sooner.  Performed at [facility], [place] [address] [place], [place] [address]     Lorah Kalina W Lakeyia Surber, [facility] for Infectious Disease [facility] [email or web address] 02/04/2021, 3:14 PM

## 2021-02-05 ENCOUNTER — Inpatient Hospital Stay: Payer: Self-pay

## 2021-02-05 DIAGNOSIS — M868X8 Other osteomyelitis, other site: Secondary | ICD-10-CM

## 2021-02-05 DIAGNOSIS — S0100XA Unspecified open wound of scalp, initial encounter: Secondary | ICD-10-CM | POA: Diagnosis not present

## 2021-02-05 LAB — SEDIMENTATION RATE: Sed Rate: 35 mm/hr — ABNORMAL HIGH (ref 0–16)

## 2021-02-05 LAB — C-REACTIVE PROTEIN: CRP: 0.6 mg/dL (ref <1.0)

## 2021-02-05 MED ORDER — CHLORHEXIDINE GLUCONATE CLOTH 2 % EX PADS
6.0000 | MEDICATED_PAD | Freq: Every day | CUTANEOUS | Status: DC
  Administered 2021-02-05 – 2021-02-06 (×2): 6 via TOPICAL

## 2021-02-05 MED ORDER — VANCOMYCIN HCL 1750 MG/350ML IV SOLN
1750.0000 mg | INTRAVENOUS | Status: DC
  Filled 2021-02-05: qty 350

## 2021-02-05 MED ORDER — LATANOPROST 0.005 % OP SOLN
1.0000 [drp] | Freq: Every day | OPHTHALMIC | Status: DC
  Administered 2021-02-05 – 2021-02-06 (×2): 1 [drp] via OPHTHALMIC
  Filled 2021-02-05: qty 2.5

## 2021-02-05 MED ORDER — SODIUM CHLORIDE 0.9 % IV SOLN
8.0000 mg/kg | Freq: Every day | INTRAVENOUS | Status: DC
  Administered 2021-02-05 – 2021-02-06 (×2): 750 mg via INTRAVENOUS
  Filled 2021-02-05 (×3): qty 15

## 2021-02-05 MED ORDER — SODIUM CHLORIDE 0.9% FLUSH: 10.0000 mL | INTRAVENOUS | Status: DC | PRN

## 2021-02-05 MED ORDER — HYDRALAZINE HCL 25 MG PO TABS
25.0000 mg | ORAL_TABLET | Freq: Three times a day (TID) | ORAL | Status: DC
  Administered 2021-02-05 – 2021-02-07 (×5): 25 mg via ORAL
  Filled 2021-02-05 (×5): qty 1

## 2021-02-05 NOTE — Progress Notes (Signed)
PHARMACY CONSULT NOTE FOR:  OUTPATIENT  PARENTERAL ANTIBIOTIC THERAPY (OPAT)  Indication: Scalp osteomyelitis Regimen: Daptomycin and ceftriaxone End date: 03/15/2021  IV antibiotic discharge orders are pended. To discharging provider:  please sign these orders via discharge navigator,  Select New Orders & click on the button choice - Manage This Unsigned Work.   Thank you for allowing pharmacy to be a part of this patient's care.  Mercy Riding, PharmD PGY1 Acute Care Pharmacy Resident

## 2021-02-05 NOTE — Progress Notes (Signed)
PROGRESS NOTE    Tommy Bauer  JME:[id number] DOB: 1941-02-14 DOA: 02/01/2021 PCP: [name], NP    Brief Narrative:  This 80 years old male with PMH significant for prostate cancer in 2011, CVA, hypertension, alcohol abuse, hyperlipidemia, central retinal artery occlusion presents in the ED with right-sided scalp wound for several months.  Initial history was provided by the ED physician and medical record.  Patient was found to be bradycardic while sleeping in the ED.  CT head showed persistent diffuse soft tissue thickening and irregularity involving the right frontoparietal scalp concerning for infection cellulitis/ osteomyelitis .  Suspected 3 to 4 cm fluid collection within this region suspicious for abscess.  Patient underwent incision and drainage by the ED physician.  Patient was started on IV vancomycin and Zosyn. Cardiology was consulted for bradycardia,  found to have second-degree AV block Mobitz type I.  Patient remains asymptomatic.  Cardiology recommended avoiding AV nodal medications.  Infectious disease consulted for osteomyelitis,  recommended 6 weeks of IV empiric treatment.  Assessment & Plan:   Principal Problem:   Scalp wound Active Problems:   Essential hypertension   Abscess or cellulitis of scalp   Osteomyelitis (HCC)   Bradycardia   Hypoxia, sleep related   Prolonged QT interval   Hyperlipidemia   History of stroke   Atrioventricular block, Mobitz type 1, Wenckebach   Scalp wound with cellulitis/abscess and possible osteomyelitis: CT of head without contrast showed cellulitis/abscess and possible cellulitis Patient was started on IV Vanco and Zosyn, we shall continue with IV vancomycin Wound was debrided by ED physician Continue wound care. Zosyn changed to ceftriaxone.  Continue Vanco and ceftriaxone. Infectious disease consulted recommended 6 weeks of IV empiric treatment vancomycin and ceftriaxone. Orthopedic consulted, recommended to consult  neurosurgery. Neurosurgery recommended continue IV antibiotics since there is no intracranial involvement,  No neurosurgical intervention needed at this time. Patient might need plastic surgery involvement. Patient is going to get the PICC line today followed by 6 weeks of empiric antibiotics. Patient will be discharged on daptomycin 750 mg IV daily and ceftriaxone 2 g daily for 6 weeks.  Hypoxia sleep-related: Patient was reported to become hypoxic when sleeping, supplemental oxygen was provided It is unknown at this time if patient has sleep apnea, Patient may require outpatient sleep study  Bradycardia-sleep-related: HR decreased to low 30s when asleep Continue to monitor patient. Cardiology consulted.,  Patient is having second-degree AV block Mobitz type I.   Patient remained asymptomatic.  Cardiology recommended avoiding AV nodal medications.  Prolonged QTc (495 ms) Avoid QT prolonging drugs Magnesium normal.   Essential hypertension Metoprolol discontinued. Hydralazine 25 mg 3 times daily added for blood pressure control.  Hyperlipidemia Continue Lipitor  History of stroke Continue aspirin and Lipitor  History of alcohol abuse Continue folic acid, multivitamin and thiamine   DVT prophylaxis: Lovenox  code Status: Full code Family Communication: No family at bedside Disposition Plan:    Status is: Inpatient  Remains inpatient appropriate because:Inpatient level of care appropriate due to severity of illness   Dispo: The patient is from: Home              Anticipated d/c is to: Home              Patient currently is not medically stable to d/c.   Difficult to place patient No   Consultants:   General surgery  Procedures: Incision and drainage of a scalp abscess Antimicrobials:  Anti-infectives (From admission, onward)   Start  Dose/Rate Route Frequency Ordered Stop   02/05/21 2000  DAPTOmycin (CUBICIN) 750 mg in sodium chloride 0.9 % IVPB         8 mg/kg  90.7 kg 130 mL/hr over 30 Minutes Intravenous Daily 02/05/21 1054     02/05/21 1700  vancomycin (VANCOREADY) IVPB 1750 mg/350 mL  Status:  Discontinued        1,750 mg 175 mL/hr over 120 Minutes Intravenous Every 24 hours 02/05/21 0841 02/05/21 1054   02/05/21 1000  cefTRIAXone (ROCEPHIN) 2 g in sodium chloride 0.9 % 100 mL IVPB        2 g 200 mL/hr over 30 Minutes Intravenous Every 24 hours 02/04/21 1033     02/04/21 1600  vancomycin (VANCOCIN) 1,750 mg in sodium chloride 0.9 % 500 mL IVPB  Status:  Discontinued        1,750 mg 250 mL/hr over 120 Minutes Intravenous Every 24 hours 02/04/21 1022 02/05/21 0841   02/02/21 1900  vancomycin (VANCOCIN) 1,750 mg in sodium chloride 0.9 % 500 mL IVPB  Status:  Discontinued        1,750 mg 250 mL/hr over 120 Minutes Intravenous Every 24 hours 02/01/21 2155 02/02/21 0729   02/02/21 1100  cefTRIAXone (ROCEPHIN) 2 g in sodium chloride 0.9 % 100 mL IVPB  Status:  Discontinued        2 g 200 mL/hr over 30 Minutes Intravenous Every 12 hours 02/02/21 0733 02/04/21 1033   02/02/21 0830  vancomycin (VANCOREADY) IVPB 1250 mg/250 mL  Status:  Discontinued        1,250 mg 166.7 mL/hr over 90 Minutes Intravenous Every 12 hours 02/02/21 0729 02/04/21 1022   02/02/21 0230  piperacillin-tazobactam (ZOSYN) IVPB 3.375 g  Status:  Discontinued        3.375 g 12.5 mL/hr over 240 Minutes Intravenous Every 8 hours 02/01/21 2155 02/02/21 0733   02/01/21 1915  piperacillin-tazobactam (ZOSYN) IVPB 3.375 g        3.375 g 12.5 mL/hr over 240 Minutes Intravenous Once 02/01/21 1914 02/02/21 0225   02/01/21 1915  vancomycin (VANCOREADY) IVPB 1500 mg/300 mL        1,500 mg 150 mL/hr over 120 Minutes Intravenous  Once 02/01/21 1914 02/02/21 0003      Subjective: Patient was seen and examined at bedside.  Overnight events noted.   Patient reports feeling better,  Patient has wound on right side of scalp, still draining. Patient is going to have a PICC line  and will be discharged with IV antibiotics for 6 weeks.  Objective: Vitals:   02/04/21 1548 02/04/21 2100 02/05/21 0553 02/05/21 1139  BP: (!) 122/107 (!) 145/95 (!) 137/99 (!) 167/75  Pulse: 72 65 83 72  Resp: 18 18 18    Temp:  98.3 F (36.8 C) 98.4 F (36.9 C) 98.1 F (36.7 C)  TempSrc:  Oral Oral   SpO2: 97% 95% 100% 99%  Weight:      Height:        Intake/Output Summary (Last 24 hours) at 02/05/2021 1709 Last data filed at 02/05/2021 1515 Gross per 24 hour  Intake 1080.07 ml  Output 1275 ml  Net -194.93 ml   Filed Weights   02/02/21 0700  Weight: 90.7 kg    Examination:  General exam: Appears calm and comfortable, not in any acute distress. Respiratory system: Clear to auscultation. Respiratory effort normal. Cardiovascular system: S1 & S2 heard, RRR. No JVD, murmurs, rubs, gallops or clicks. No pedal edema. Gastrointestinal system: Abdomen  is nondistended, soft and nontender. No organomegaly or masses felt.  Normal bowel sounds heard. Central nervous system: Alert and oriented. No focal neurological deficits. Extremities: Symmetric 5 x 5 power.  No edema, no cyanosis, no clubbing. Skin: Chronic wound on the right scalp,  slight drainage. Psychiatry: Judgement and insight appear normal. Mood & affect appropriate.     Data Reviewed: I have personally reviewed following labs and imaging studies  CBC: Recent Labs  Lab 02/01/21 1856 02/02/21 0802 02/03/21 0149  WBC 6.1 5.5 6.0  NEUTROABS 3.6  --   --   HGB 12.8* 13.0 12.0*  HCT 42.2 44.5 38.6*  MCV 90.8 90.4 88.3  PLT 284 255 419   Basic Metabolic Panel: Recent Labs  Lab 02/01/21 1856 02/02/21 0802 02/03/21 0149  NA 137  --  139  K 4.0  --  3.9  CL 104  --  107  CO2 28  --  28  GLUCOSE 111*  --  87  BUN 20  --  14  CREATININE 1.03 0.97 1.07  CALCIUM 9.3  --  9.0  MG  --   --  1.9  PHOS  --   --  3.6   GFR: Estimated Creatinine Clearance: 60.4 mL/min (by C-G formula based on SCr of 1.07  mg/dL). Liver Function Tests: Recent Labs  Lab 02/03/21 0149  AST 14*  ALT 16  ALKPHOS 53  BILITOT 0.9  PROT 7.4  ALBUMIN 2.9*   No results for input(s): LIPASE, AMYLASE in the last 168 hours. No results for input(s): AMMONIA in the last 168 hours. Coagulation Profile: Recent Labs  Lab 02/03/21 0149  INR 1.3*   Cardiac Enzymes: No results for input(s): CKTOTAL, CKMB, CKMBINDEX, TROPONINI in the last 168 hours. BNP (last 3 results) No results for input(s): PROBNP in the last 8760 hours. HbA1C: No results for input(s): HGBA1C in the last 72 hours. CBG: No results for input(s): GLUCAP in the last 168 hours. Lipid Profile: No results for input(s): CHOL, HDL, LDLCALC, TRIG, CHOLHDL, LDLDIRECT in the last 72 hours. Thyroid Function Tests: No results for input(s): TSH, T4TOTAL, FREET4, T3FREE, THYROIDAB in the last 72 hours. Anemia Panel: No results for input(s): VITAMINB12, FOLATE, FERRITIN, TIBC, IRON, RETICCTPCT in the last 72 hours. Sepsis Labs: Recent Labs  Lab 02/01/21 2007  LATICACIDVEN 1.1    Recent Results (from the past 240 hour(s))  Culture, blood (routine x 2)     Status: None (Preliminary result)   Collection Time: 02/01/21  8:07 PM   Specimen: BLOOD  Result Value Ref Range Status   Specimen Description BLOOD LEFT ANTECUBITAL  Final   Special Requests   Final    BOTTLES DRAWN AEROBIC AND ANAEROBIC Blood Culture results may not be optimal due to an inadequate volume of blood received in culture bottles   Culture   Final    NO GROWTH 4 DAYS Performed at [facility] [address], [place],  [address]    Report Status PENDING  Incomplete  Culture, blood (routine x 2)     Status: None (Preliminary result)   Collection Time: 02/01/21  9:09 PM   Specimen: BLOOD RIGHT HAND  Result Value Ref Range Status   Specimen Description BLOOD RIGHT HAND  Final   Special Requests   Final    BOTTLES DRAWN AEROBIC AND ANAEROBIC Blood Culture results may not  be optimal due to an inadequate volume of blood received in culture bottles   Culture  Final    NO GROWTH 4 DAYS Performed at [facility], [place] [address], [place], [place] [address]    Report Status PENDING  Incomplete  SARS CORONAVIRUS 2 (TAT 6-24 HRS) Nasopharyngeal Nasopharyngeal Swab     Status: None   Collection Time: 02/02/21 12:09 AM   Specimen: Nasopharyngeal Swab  Result Value Ref Range Status   SARS Coronavirus 2 NEGATIVE NEGATIVE Final    Comment: (NOTE) SARS-CoV-2 target nucleic acids are NOT DETECTED.  The SARS-CoV-2 RNA is generally detectable in upper and lower respiratory specimens during the acute phase of infection. Negative results do not preclude SARS-CoV-2 infection, do not rule out co-infections with other pathogens, and should not be used as the sole basis for treatment or other patient management decisions. Negative results must be combined with clinical observations, patient history, and epidemiological information. The expected result is Negative.  Fact Sheet for Patients: SugarRoll.be  Fact Sheet for Healthcare Providers: https://www.woods-mathews.com/  This test is not yet approved or cleared by the [place] FDA and  has been authorized for detection and/or diagnosis of SARS-CoV-2 by FDA under an Emergency Use Authorization (EUA). This EUA will remain  in effect (meaning this test can be used) for the duration of the COVID-19 declaration under Se ction 564(b)(1) of the Act, 21 U.S.C. section 360bbb-3(b)(1), unless the authorization is terminated or revoked sooner.  Performed at [facility], [place] [address] [place], [place] [address]    Radiology Studies: [place] EKG SITE RITE  Result Date: 02/05/2021 If Site Rite image not attached, placement could not be confirmed due to current cardiac rhythm.  Scheduled Meds: . aspirin  81 mg Oral Daily  . atorvastatin  40 mg Oral Daily  . enoxaparin  (LOVENOX) injection  40 mg Subcutaneous Q24H  . folic acid  1 mg Oral Daily  . latanoprost  1 drop Both Eyes QHS  . multivitamin with minerals  1 tablet Oral Daily  . thiamine  100 mg Oral Daily   Continuous Infusions: . cefTRIAXone (ROCEPHIN)  IV Stopped (02/05/21 1112)  . DAPTOmycin (CUBICIN)  IV       LOS: 3 days    Time spent: 25 mins    Shawna Clamp, MD [facility]   If 7PM-7AM, please contact night-coverage

## 2021-02-05 NOTE — NC FL2 (Signed)
[place] LEVEL OF CARE SCREENING TOOL     IDENTIFICATION  Patient Name: Tommy Bauer Birthdate: 05-Feb-1941 Sex: male Admission Date (Current Location): 02/01/2021  [facility] and [place] Number:  Herbalist and Address:  [facility], [place] [address] [place], [place] [address]      Provider Number: [id number]  Attending Physician Name and Address:  Shawna Clamp, MD  Relative Name and Phone Number:       Current Level of Care: Hospital Recommended Level of Care: [place] Prior Approval Number:    Date Approved/Denied:   PASRR Number:    Discharge Plan: SNF    Current Diagnoses: Patient Active Problem List   Diagnosis Date Noted  . Scalp wound 02/02/2021  . Abscess or cellulitis of scalp 02/02/2021  . Osteomyelitis ([place]) 02/02/2021  . Bradycardia 02/02/2021  . Hypoxia, sleep related 02/02/2021  . Prolonged QT interval 02/02/2021  . Hyperlipidemia 02/02/2021  . History of stroke 02/02/2021  . Atrioventricular block, Mobitz type 1, Wenckebach   . Memory loss 12/14/2020  . Gait abnormality 12/14/2020  . Abnormal EEG 12/14/2020  . Hypothermia 09/17/2020  . AMS (altered mental status) 09/13/2020  . AKI (acute kidney injury) ([place]) 09/13/2020  . Conjunctivitis 09/13/2020  . Rhabdomyolysis 09/13/2020  . Central retinal artery occlusion 07/15/2015  . 80 year-old gentleman s/p radical prostatectomy for pT3a adenocarcinoma with a Gleason's Score of 4+3, with extracapsular extension noted on pathology, and rising PSA of 0.36 05/19/2010  . TOBACCO ABUSE 11/06/2009  . CARPAL TUNNEL SYNDROME 10/15/2008  . BACK PAIN 07/16/2008  . INGUINAL HERNIA 12/04/2007  . Cardiovascular disease 11/12/2007  . ERECTILE DYSFUNCTION, ORGANIC 07/11/2007  . HEADACHE 07/11/2007  . Essential hypertension 06/08/2007  . History of colonic polyps 06/08/2007    Orientation RESPIRATION BLADDER Height & Weight     Self,Time  Normal  Incontinent Weight: 200 lb (90.7 kg) Height:  6' (182.9 cm)  BEHAVIORAL SYMPTOMS/MOOD NEUROLOGICAL BOWEL NUTRITION STATUS      Incontinent Diet (See discharge summary)  AMBULATORY STATUS COMMUNICATION OF NEEDS Skin   Extensive Assist Verbally Surgical wounds (Scalp wound)                       Personal Care Assistance Level of Assistance  Bathing,Feeding,Dressing Bathing Assistance: Maximum assistance Feeding assistance: Independent Dressing Assistance: Maximum assistance     Functional Limitations Info  Sight,Hearing,Speech Sight Info: Adequate Hearing Info: Adequate Speech Info: Adequate    SPECIAL CARE FACTORS FREQUENCY                       Contractures      Additional Factors Info  Code Status,Allergies,Insulin Sliding Scale Code Status Info: Full Allergies Info: NKA   Insulin Sliding Scale Info: lovenox       Current Medications (02/05/2021):  This is the current hospital active medication list Current Facility-Administered Medications  Medication Dose Route Frequency Provider Last Rate Last Admin  . aspirin chewable tablet 81 mg  81 mg Oral Daily Adefeso, Oladapo, DO   81 mg at 02/05/21 1026  . atorvastatin (LIPITOR) tablet 40 mg  40 mg Oral Daily Adefeso, Oladapo, DO   40 mg at 02/05/21 1026  . cefTRIAXone (ROCEPHIN) 2 g in sodium chloride 0.9 % 100 mL IVPB  2 g Intravenous Q24H Dang, Thuy D, RPH 200 mL/hr at 02/05/21 1042 2 g at 02/05/21 1042  . DAPTOmycin (CUBICIN) 750 mg in sodium  chloride 0.9 % IVPB  8 mg/kg Intravenous Q2000 Shawna Clamp, MD      . enoxaparin (LOVENOX) injection 40 mg  40 mg Subcutaneous Q24H Adefeso, Oladapo, DO   40 mg at 02/05/21 0717  . folic acid (FOLVITE) tablet 1 mg  1 mg Oral Daily Adefeso, Oladapo, DO   1 mg at 02/05/21 1026  . latanoprost (XALATAN) 0.005 % ophthalmic solution 1 drop  1 drop Both Eyes QHS Shawna Clamp, MD      . multivitamin with minerals tablet 1 tablet  1 tablet Oral Daily Adefeso, Oladapo, DO   1  tablet at 02/05/21 1026  . thiamine tablet 100 mg  100 mg Oral Daily Adefeso, Oladapo, DO   100 mg at 02/05/21 1026     Discharge Medications: Please see discharge summary for a list of discharge medications.  Relevant Imaging Results:  Relevant Lab Results:   Additional Information SSN [id number], IV ABX daptomycin and ceftriaxone.  Will treat for 6 weeks through 03/15/21  Emeterio Reeve, [place]

## 2021-02-05 NOTE — Care Management Important Message (Signed)
Important Message  Patient Details  Name: Tommy Bauer MRN: [id number] Date of Birth: [birth year]   Medicare Important Message Given:  Yes     Telesha Deguzman [address] 02/05/2021, 2:41 PM

## 2021-02-05 NOTE — Progress Notes (Signed)
Tommy Bauer for Infectious Disease   Reason for visit: Follow up on Skull osteomyelitis  Interval History: no new issues, afebrile. No plan for any surgical intervention.  May need plastic surgery at some point after discharge.   Day 5 total antibiotics Day 5 vancomycin - changing to daptomycin Day 4 ceftriaxone Blood cultures 02/01/21 NGTD  Physical Exam: Constitutional:  Vitals:   02/04/21 2100 02/05/21 0553  BP: (!) 145/95 (!) 137/99  Pulse: 65 83  Resp: 18 18  Temp: 98.3 F (36.8 C) 98.4 F (36.9 C)  SpO2: 95% 100%   patient appears in NAD HENT: scalp wound appears dry, no drainage Respiratory: Normal respiratory effort; CTA B Cardiovascular: RRR  Review of Systems: Constitutional: negative for fevers and chills Gastrointestinal: negative for nausea and diarrhea  Lab Results  Component Value Date   WBC 6.0 02/03/2021   HGB 12.0 (L) 02/03/2021   HCT 38.6 (L) 02/03/2021   MCV 88.3 02/03/2021   PLT 261 02/03/2021    Lab Results  Component Value Date   CREATININE 1.07 02/03/2021   BUN 14 02/03/2021   NA 139 02/03/2021   K 3.9 02/03/2021   CL 107 02/03/2021   CO2 28 02/03/2021    Lab Results  Component Value Date   ALT 16 02/03/2021   AST 14 (L) 02/03/2021   ALKPHOS 53 02/03/2021     Microbiology: Recent Results (from the past 240 hour(s))  Culture, blood (routine x 2)     Status: None (Preliminary result)   Collection Time: 02/01/21  8:07 PM   Specimen: BLOOD  Result Value Ref Range Status   Specimen Description BLOOD LEFT ANTECUBITAL  Final   Special Requests   Final    BOTTLES DRAWN AEROBIC AND ANAEROBIC Blood Culture results may not be optimal due to an inadequate volume of blood received in culture bottles   Culture   Final    NO GROWTH 4 DAYS Performed at [facility], [place] [address], [place], [place] [address]    Report Status PENDING  Incomplete  Culture, blood (routine x 2)     Status: None (Preliminary result)   Collection  Time: 02/01/21  9:09 PM   Specimen: BLOOD RIGHT HAND  Result Value Ref Range Status   Specimen Description BLOOD RIGHT HAND  Final   Special Requests   Final    BOTTLES DRAWN AEROBIC AND ANAEROBIC Blood Culture results may not be optimal due to an inadequate volume of blood received in culture bottles   Culture   Final    NO GROWTH 4 DAYS Performed at [facility], [place] [address], [place], [place] [address]    Report Status PENDING  Incomplete  SARS CORONAVIRUS 2 (TAT 6-24 HRS) Nasopharyngeal Nasopharyngeal Swab     Status: None   Collection Time: 02/02/21 12:09 AM   Specimen: Nasopharyngeal Swab  Result Value Ref Range Status   SARS Coronavirus 2 NEGATIVE NEGATIVE Final    Comment: (NOTE) SARS-CoV-2 target nucleic acids are NOT DETECTED.  The SARS-CoV-2 RNA is generally detectable in upper and lower respiratory specimens during the acute phase of infection. Negative results do not preclude SARS-CoV-2 infection, do not rule out co-infections with other pathogens, and should not be used as the sole basis for treatment or other patient management decisions. Negative results must be combined with clinical observations, patient history, and epidemiological information. The expected result is Negative.  Fact Sheet for Patients: SugarRoll.be  Fact Sheet for Healthcare Providers:  https://www.woods-mathews.com/  This test is not yet approved or cleared by the [place] and  has been authorized for detection and/or diagnosis of SARS-CoV-2 by FDA under an Emergency Use Authorization (EUA). This EUA will remain  in effect (meaning this test can be used) for the duration of the COVID-19 declaration under Se ction 564(b)(1) of the Act, 21 U.S.C. section 360bbb-3(b)(1), unless the authorization is terminated or revoked sooner.  Performed at [facility], [place] [address] [place], [place] [address]     Impression/Plan:  1.  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  Will treat for 6 weeks through 03/15/21.    2. Scalp wound - dry now and s/p debridement.  He may need plastic surgery involvement after treatment for consideration of flap closure.    3.  Access - will order picc line  Diagnosis: Calvarium osteomyelitis  Culture Result: no culture  No Known Allergies  OPAT Orders Discharge antibiotics to be given via PICC line Discharge antibiotics: daptomycin 750 mg IV daily and ceftriaxone 2 grams IV daily Per pharmacy protocol yes Duration: 6 weeks End Date: 03/15/21  [facility] Care Per Protocol: yes  Home health RN for IV administration and teaching; PICC line care and labs.    Labs weekly while on IV antibiotics: _x_ CBC with differential __ BMP _x_ CMP _x_ CRP _x_ ESR __ Vancomycin trough _x_ CK  _x_ Please pull PIC at completion of IV antibiotics __ Please leave PIC in place until doctor has seen patient or been notified  Fax weekly labs to [phone]  Clinic Follow Up Appt: 02/24/21  @ 3:30 with Dr. Linus Salmons

## 2021-02-05 NOTE — TOC Progression Note (Addendum)
Transition of Care [facility]) - Progression Note    Patient Details  Name: Tommy Bauer MRN: [id number] Date of Birth: 11-04-1940  [facility]) CM/SW Contact  Jacalyn Lefevre Edson Snowball, RN Phone Number: 02/05/2021, 11:41 AM  Clinical Narrative:     Plan for PICC line placement today and discharge to [facility] tomorrow with  daptomycin and ceftriaxone.  Will treat for 6 weeks through 03/15/21.  Juliann Pulse at [facility] aware can  accept patient tomorrow on  daptomycin and ceftriaxone.    [name] [phone] aware.  Expected Discharge Plan: [place] Barriers to Discharge: Continued Medical Work up  Expected Discharge Plan and Services Expected Discharge Plan: [place] arrangements for the past 2 months: [place]                 DME Arranged: N/A DME Agency: NA       HH Arranged: NA           Social Determinants of Health (SDOH) Interventions    Readmission Risk Interventions No flowsheet data found.

## 2021-02-05 NOTE — Plan of Care (Signed)
  Problem: Education: Goal: Knowledge of General Education information will improve Description Including pain rating scale, medication(s)/side effects and non-pharmacologic comfort measures Outcome: Progressing   

## 2021-02-05 NOTE — Progress Notes (Signed)
Peripherally Inserted Central Catheter Placement  The IV Nurse has discussed with the patient and/or persons authorized to consent for the patient, the purpose of this procedure and the potential benefits and risks involved with this procedure.  The benefits include less needle sticks, lab draws from the catheter, and the patient may be discharged home with the catheter. Risks include, but not limited to, infection, bleeding, blood clot (thrombus formation), and puncture of an artery; nerve damage and irregular heartbeat and possibility to perform a PICC exchange if needed/ordered by physician.  Alternatives to this procedure were also discussed.  Bard Power PICC patient education guide, fact sheet on infection prevention and patient information card has been provided to patient /or left at bedside.    PICC Placement Documentation  PICC Single Lumen 02/05/21 PICC Right Brachial 43 cm 0 cm (Active)  Indication for Insertion or Continuance of Line Home intravenous therapies (PICC only) 02/05/21 1808  Exposed Catheter (cm) 0 cm 02/05/21 1808  Site Assessment Clean;Dry;Intact 02/05/21 1808  Line Status Flushed;Saline locked;Blood return noted 02/05/21 1808  Dressing Type Transparent;Securing device 02/05/21 1808  Dressing Status Clean;Dry;Intact 02/05/21 1808  Antimicrobial disc in place? Yes 02/05/21 1808  Safety Lock Not Applicable 49/70/26 3785  Dressing Change Due 02/12/21 02/05/21 1808       Tommy Bauer 02/05/2021, 6:11 PM

## 2021-02-06 DIAGNOSIS — S0100XA Unspecified open wound of scalp, initial encounter: Secondary | ICD-10-CM | POA: Diagnosis not present

## 2021-02-06 LAB — CULTURE, BLOOD (ROUTINE X 2)
Culture: NO GROWTH
Culture: NO GROWTH

## 2021-02-06 LAB — RESP PANEL BY RT-PCR (FLU A&B, COVID) ARPGX2
Influenza A by PCR: NEGATIVE
Influenza B by PCR: NEGATIVE
SARS Coronavirus 2 by RT PCR: NEGATIVE

## 2021-02-06 MED ORDER — DAPTOMYCIN IV (FOR PTA / DISCHARGE USE ONLY): 750.0000 mg | INTRAVENOUS | 0 refills | Status: AC

## 2021-02-06 MED ORDER — HYDRALAZINE HCL 25 MG PO TABS: 25.0000 mg | ORAL_TABLET | Freq: Three times a day (TID) | ORAL | 0 refills | Status: DC

## 2021-02-06 MED ORDER — CEFTRIAXONE IV (FOR PTA / DISCHARGE USE ONLY): 2.0000 g | INTRAVENOUS | 0 refills | Status: AC

## 2021-02-06 NOTE — TOC Progression Note (Addendum)
Transition of Care [facility]) - Progression Note    Patient Details  Name: Tommy Bauer MRN: [id number] Date of Birth: [birth year]  Transition of Care [facility]) CM/SW [place], [place] Phone Number: 02/06/2021, 3:50 PM  Clinical Narrative:    CSW waiting on covid results for disposition to [facility].Physician and [facility] updated. Covid test was collected at 3:00pm.   Expected Discharge Plan: [place] Barriers to Discharge: Continued Medical Work up  Expected Discharge Plan and Services Expected Discharge Plan: [place] arrangements for the past 2 months: [place] Expected Discharge Date: 02/06/21               DME Arranged: N/A DME Agency: NA       HH Arranged: NA           Social Determinants of Health (SDOH) Interventions    Readmission Risk Interventions No flowsheet data found.

## 2021-02-06 NOTE — Discharge Summary (Addendum)
Physician Discharge Summary  Tommy Bauer MMN:[id number] DOB: Jul 21, 1941 DOA: 02/01/2021  PCP: [name], NP  Admit date: 02/01/2021   Discharge date:  02/07/21  Admitted From :  Home.  Disposition:  SNF [facility])  Recommendations for Outpatient Follow-up:  1. Follow up with PCP in 1-2 weeks. 2. Please obtain BMP/CBC in one week. 3. Advised to follow-up with plastic surgeon for scalp wound after completion of antibiotics. 4. Patient has been discharged with PICC line to continue IV antibiotics for 6 weeks. 5. Advised to take ceftriaxone 2 g daily and daptomycin 750 mg daily. 6. Advised to follow-up with infectious disease Dr. Linus Salmons in 4 weeks. 7. Advised to hold metoprolol since patient was having significant bradycardia with pauses. 8. Avoid AV nodal medications.  Home Health: None Equipment/Devices: PICC line  Discharge Condition: Stable CODE STATUS:Full code Diet recommendation: Heart Healthy   Brief Summary / Hospital Course: This 80 years old male with PMH significant for prostate cancer in 2011, CVA, hypertension, alcohol abuse, hyperlipidemia, central retinal artery occlusion presents in the ED with right-sided scalp wound for several months. Initial history was provided by the ED physician and medical records. Patient was found to be bradycardic while sleeping in the ED.CT head showed persistent diffuse soft tissue thickening and irregularity involving the right frontoparietal scalp concerning for infection cellulitis/ osteomyelitis. Suspected 3 to 4 cm fluid collection within this region suspicious for abscess. Patient underwent incision and drainage by the ED physician. Patient was admitted for possible cellulitis and osteomyelitis, started on IV vancomycin and Zosyn. Cardiology was consulted for bradycardia,found to have second-degree AV block Mobitz type I. Patient remains asymptomatic. Cardiology recommended avoiding AV nodal medications.  Cardiology  deferred ischemic work-up. Infectious disease consulted for osteomyelitis, since there is no plan for any surgical intervention, recommended 6 weeks of IV empiric antibiotics.  Patient has had the PICC line to continue antibiotics for 6 weeks. Blood cultures so far no growth.  Patient might need plastic surgery at some point after discharge for closure of the wound.  PT recommended skilled nursing facility.  Patient is being discharged to [facility] home for rehab.  Patient will continue daptomycin 750 mg IV daily and ceftriaxone 2 g daily for 6 weeks. Last date of antibiotics would be 03/15/21.  PICC line can be discontinued after completion of antibiotics.  He was managed for below problems.  Discharge Diagnoses:  Principal Problem:   Scalp wound Active Problems:   Essential hypertension   Abscess or cellulitis of scalp   Osteomyelitis (HCC)   Bradycardia   Hypoxia, sleep related   Prolonged QT interval   Hyperlipidemia   History of stroke   Atrioventricular block, Mobitz type 1, Wenckebach  Scalp wound with cellulitis/abscess and possible osteomyelitis: CT of head without contrast showed cellulitis / abscess and possible cellulitis. Patient was started on IV Vanco and Zosyn, we shall continue with IV vancomycin Wound was debrided by ED physician Continue wound care. Zosyn changed to ceftriaxone.  Continue Vanco and ceftriaxone. Infectious disease consulted recommended 6 weeks of IV empiric treatment vancomycin and ceftriaxone. Orthopedic consulted, recommended to consult neurosurgery. Neurosurgery recommended continue IV antibiotics since there is no intracranial involvement,  No neurosurgical intervention needed at this time. Patient might need plastic surgery involvement. Patient is going to get the PICC line followed by 6 weeks of empiric antibiotics. Patient will be discharged on daptomycin 750 mg IV daily and ceftriaxone 2 g daily for 6 weeks.  Hypoxia  sleep-related: Patient  was reported to become hypoxic when sleeping, supplemental oxygen was provided It is unknown at this time if patient has sleep apnea,Patient may require outpatient sleep study  Bradycardia-sleep-related: HR decreased to low 30s when asleep. Continue to monitor patient. Cardiology consulted.,  Patient is having second-degree AV block Mobitz type I.   Patient remained asymptomatic. Cardiology recommended avoiding AV nodal medications.  Prolonged QTc(495 ms) Avoid QT prolonging drugs Magnesium normal.   Essential hypertension Metoprolol discontinued. Hydralazine 25 mg 3 times daily added for blood pressure control.  Hyperlipidemia Continue Lipitor  History of stroke Continue aspirin and Lipitor  History of alcohol abuse Continue folic acid, multivitamin and thiamine   Discharge Instructions  Discharge Instructions    [facility] pharmacist to adjust dose for Vancomycin, Aminoglycosides and other anti-infective therapies as requested by physician.   Complete by: As directed    [facility] to provide Cath Flo 2mg    Complete by: As directed    Administer for PICC line occlusion and as ordered by physician for other access device issues.   Anaphylaxis Kit: Provided to treat any anaphylactic reaction to the medication being provided to the patient if First Dose or when requested by physician   Complete by: As directed    Epinephrine 1mg /ml vial / amp: Administer 0.3mg  (0.67ml) subcutaneously once for moderate to severe anaphylaxis, nurse to call physician and pharmacy when reaction occurs and call 911 if needed for immediate care   Diphenhydramine 50mg /ml IV vial: Administer 25-50mg  IV/IM PRN for first dose reaction, rash, itching, mild reaction, nurse to call physician and pharmacy when reaction occurs   Sodium Chloride 0.9% NS 581ml IV: Administer if needed for hypovolemic blood pressure drop or as ordered by physician after  call to physician with anaphylactic reaction   Call MD for:  difficulty breathing, headache or visual disturbances   Complete by: As directed    Call MD for:  persistant dizziness or light-headedness   Complete by: As directed    Call MD for:  persistant nausea and vomiting   Complete by: As directed    Call MD for:  redness, tenderness, or signs of infection (pain, swelling, redness, odor or green/yellow discharge around incision site)   Complete by: As directed    Change dressing on IV access line weekly and PRN   Complete by: As directed    Diet - low sodium heart healthy   Complete by: As directed    Diet Carb Modified   Complete by: As directed    Discharge instructions   Complete by: As directed    Advised to follow-up with primary care physician in 1 week. Advised to follow-up wit plastic surgeon for scalp wound. Patient has been discharged with a PICC line to continue IV antibiotic for 6 weeks. Advised to take ceftriaxone 2 g daily and daptomycin 750 mg daily. Advised to follow-up with infectious disease Dr. In 4 weeks.   Discharge wound care:   Complete by: As directed    Advised to follow-up wound care at nursing home.   Flush IV access with Sodium Chloride 0.9% and Heparin 10 units/ml or 100 units/ml   Complete by: As directed    Home infusion instructions - [facility]   Complete by: As directed    Instructions: Flush IV access with Sodium Chloride 0.9% and Heparin 10units/ml or 100units/ml   Change dressing on IV access line: Weekly and PRN   Instructions Cath Flo 2mg : Administer for PICC Line occlusion and  as ordered by physician for other access device   [facility] pharmacist to adjust dose for: Vancomycin, Aminoglycosides and other anti-infective therapies as requested by physician   Increase activity slowly   Complete by: As directed    Method of administration may be changed at the discretion of home infusion pharmacist based upon assessment  of the patient and/or caregiver's ability to self-administer the medication ordered   Complete by: As directed      Allergies as of 02/07/2021   No Known Allergies     Medication List    STOP taking these medications   metoprolol succinate 100 MG 24 hr tablet Commonly known as: TOPROL-XL     TAKE these medications   acetaminophen 500 MG tablet Commonly known as: TYLENOL Take 500 mg by mouth every 4 (four) hours as needed for moderate pain.   ammonium lactate 12 % cream Commonly known as: AMLACTIN Apply topically at bedtime. Apply to lower legs and feet   aspirin 81 MG tablet Take 1 tablet (81 mg total) by mouth daily.   atorvastatin 40 MG tablet Commonly known as: LIPITOR Take 1 tablet (40 mg total) by mouth daily.   bimatoprost 0.03 % ophthalmic solution Commonly known as: LUMIGAN Place 1 drop into both eyes at bedtime.   cefTRIAXone  IVPB Commonly known as: ROCEPHIN Inject 2 g into the vein daily. Indication:  Scalp osteomyelitis First Dose: Yes Last Day of Therapy:  03/15/2021 Labs - Once weekly:  CBC/D and BMP, Labs - Every other week:  ESR and CRP Method of administration: IV Push Method of administration may be changed at the discretion of home infusion pharmacist based upon assessment of the patient and/or caregiver's ability to self-administer the medication ordered.   daptomycin  IVPB Commonly known as: CUBICIN Inject 750 mg into the vein daily. Indication: Scalp osteomyelitis First Dose: Yes Last Day of Therapy:  03/15/2021 Labs - Once weekly:  CBC/D, BMP, and CPK Labs - Every other week:  ESR and CRP Method of administration: IV Push Method of administration may be changed at the discretion of home infusion pharmacist based upon assessment of the patient and/or caregiver's ability to self-administer the medication ordered.   DERMACLOUD EX Apply 1 application topically daily. Apply to scrotum and sacrum   Diclofenac Sodium 3 % Gel Apply 1 application  topically See admin instructions. To both knees bid x 4 days   eucerin cream Apply 1 application topically daily. Apply to extremities,trunk and back after bath   folic acid 1 MG tablet Commonly known as: FOLVITE Take 1 mg by mouth daily.   furosemide 40 MG tablet Commonly known as: LASIX Take 40 mg by mouth.   hydrALAZINE 25 MG tablet Commonly known as: APRESOLINE Take 1 tablet (25 mg total) by mouth every 8 (eight) hours.   multivitamin tablet Take 1 tablet by mouth daily.   potassium chloride 20 MEQ packet Commonly known as: KLOR-CON Take 40 mEq by mouth daily. When taking lasix   thiamine 100 MG tablet Take 100 mg by mouth daily.            Discharge Care Instructions  (From admission, onward)         Start     Ordered   02/06/21 0000  Change dressing on IV access line weekly and PRN  (Home infusion instructions - [facility] )        02/06/21 1041   02/06/21 0000  Discharge wound care:  Comments: Advised to follow-up wound care at nursing home.   02/06/21 1046          Follow-up Information    Nafziger, Tommi Rumps, NP Follow up in 1 week(s).   Specialty: Family Medicine Contact information: [place] [address] [phone]        Thayer Headings, MD Follow up in 4 week(s).   Specialty: Infectious Diseases Contact information: [address] [place] [address] [phone]        Vinnie Level, FNP .   Specialty: Family Medicine Contact information: [address] [place] [address] [phone]              No Known Allergies  Consultations:  Infectious Diseases.   Procedures/Studies: CT Head W or Wo Contrast  Result Date: 02/01/2021 CLINICAL DATA:  Initial evaluation for cellulitis of right scalp. EXAM: CT HEAD WITHOUT AND WITH CONTRAST TECHNIQUE: Contiguous axial images were obtained from the base of the skull through the vertex without and with intravenous  contrast CONTRAST:  117mL OMNIPAQUE IOHEXOL 350 MG/ML SOLN COMPARISON:  Prior head CT from 01/18/2021. FINDINGS: Brain: Generalized age-related cerebral atrophy with moderate chronic microvascular ischemic disease, stable. No acute intracranial hemorrhage. No acute large vessel territory infarct. No mass lesion, midline shift or mass effect. Mild ventricular prominence related to global parenchymal volume loss without hydrocephalus. No extra-axial fluid collection. Vascular: No hyperdense vessel prior to contrast administration. Calcified atherosclerosis present at skull base. Following contrast administration, normal intravascular enhancement seen throughout the intracranial circulation. Major dural sinuses appear grossly patent. Skull: Again seen is diffuse soft tissue thickening and irregularity involving the right frontoparietal scalp, concerning for infection/cellulitis. Since previous exam, the superficial aspect of the area involved appears somewhat more eroded as compared to previous. Suspected fluid collection measuring approximately 3.7 x 0.9 x 4.1 cm seen at the right frontoparietal scalp, suspicious for abscess (series 9, image 36). This abuts the underlying calvarium. The outer table of the calvarium again seen to be somewhat irregular with permeated appearance, suggesting possible associated osteomyelitis. Again, no evidence for intracranial spread of infection. Sinuses/Orbits: Globes and orbital soft tissues within normal limits. Visualized paranasal sinuses are largely clear. Moderate right mastoid effusion, similar to previous. Other: None. IMPRESSION: 1. Persistent diffuse soft tissue thickening and irregularity involving the right frontoparietal scalp, concerning for infection/cellulitis. Suspected 3-4 cm fluid collection within this region, suspicious for abscess. Irregularity of the underlying outer table of the calvarium suspicious for associated osseous involvement/osteomyelitis. No evidence  for intracranial involvement. 2. No other acute intracranial abnormality. 3. Stable atrophy with moderate chronic microvascular ischemic disease. 4. Moderate right mastoid effusion, similar to previous. Electronically Signed   By: Jeannine Boga M.D.   On: 02/01/2021 23:41   CT HEAD W & WO CONTRAST  Result Date: 01/18/2021 CLINICAL DATA:  Confusion, question scalp infection EXAM: CT HEAD WITHOUT AND WITH CONTRAST TECHNIQUE: Contiguous axial images were obtained from the base of the skull through the vertex without and with intravenous contrast CONTRAST:  63mL ISOVUE-300 IOPAMIDOL (ISOVUE-300) INJECTION 61% COMPARISON:  09/13/2020 FINDINGS: Brain: There is no acute intracranial hemorrhage, mass, mass effect, or edema. No abnormal enhancement. Gray-white differentiation is preserved. There is no extra-axial fluid collection. Patchy and confluent areas of hypoattenuation in the supratentorial white matter nonspecific but may reflect stable chronic microvascular ischemic changes. Vascular: There is atherosclerotic calcification at the skull base. Skull: There is irregularity of the outer table of the right parietal calvarium. Sinuses/Orbits:  No acute finding. Other: Right frontoparietal scalp soft tissue swelling with irregularity of the skin surface. There is an area of fluid attenuation demonstrating enhancement at the periphery measuring approximately 3.5 x 0.9 x 3.3 cm. There is a opacification of the right mastoid tip. IMPRESSION: Right frontoparietal scalp soft tissue swelling with irregularity of the overlying skin surface. Suspected abscess formation measuring 3 cm. Irregularity of the adjacent outer table of the right parietal calvarium could reflect osseous extension. No evidence of intracranial involvement. Electronically Signed   By: Macy Mis M.D.   On: 01/18/2021 11:29   [place] EKG SITE RITE  Result Date: 02/05/2021 If Site Rite image not attached, placement could not be confirmed due to  current cardiac rhythm.  CT head.   Subjective: Patient was seen and examined at bedside.  Overnight events noted,  Patient reports feeling much improved.   Patient has a PICC line yesterday,  Patient is being discharged to skilled nursing facility for continuation of IV antibiotics and rehabilitation.  Discharge Exam: Vitals:   02/06/21 2146 02/07/21 0450  BP: (!) 152/84 (!) 148/76  Pulse: 74 80  Resp: 17 17  Temp: 98.3 F (36.8 C) 98 F (36.7 C)  SpO2: 96% 97%   Vitals:   02/06/21 0520 02/06/21 1454 02/06/21 2146 02/07/21 0450  BP: (!) 169/73 123/90 (!) 152/84 (!) 148/76  Pulse: 82 83 74 80  Resp: $Remo'17 18 17 17  'KSdHn$ Temp: 98.5 F (36.9 C) 98.6 F (37 C) 98.3 F (36.8 C) 98 F (36.7 C)  TempSrc: Oral Oral Oral Oral  SpO2: 98% 98% 96% 97%  Weight:      Height:        General: Pt is alert, awake, not in acute distress, Right scalp wound healing and appears dry. Cardiovascular: RRR, S1/S2 +, no rubs, no gallops Respiratory: CTA bilaterally, no wheezing, no rhonchi Abdominal: Soft, NT, ND, bowel sounds + Extremities: no edema, no cyanosis    The results of significant diagnostics from this hospitalization (including imaging, microbiology, ancillary and laboratory) are listed below for reference.     Microbiology: Recent Results (from the past 240 hour(s))  Culture, blood (routine x 2)     Status: None   Collection Time: 02/01/21  8:07 PM   Specimen: BLOOD  Result Value Ref Range Status   Specimen Description BLOOD LEFT ANTECUBITAL  Final   Special Requests   Final    BOTTLES DRAWN AEROBIC AND ANAEROBIC Blood Culture results may not be optimal due to an inadequate volume of blood received in culture bottles   Culture   Final    NO GROWTH 5 DAYS Performed at [facility], [place] [address], [place], [place] [address]    Report Status 02/06/2021 FINAL  Final  Culture, blood (routine x 2)     Status: None   Collection Time: 02/01/21  9:09 PM   Specimen: BLOOD  RIGHT HAND  Result Value Ref Range Status   Specimen Description BLOOD RIGHT HAND  Final   Special Requests   Final    BOTTLES DRAWN AEROBIC AND ANAEROBIC Blood Culture results may not be optimal due to an inadequate volume of blood received in culture bottles   Culture   Final    NO GROWTH 5 DAYS Performed at [facility], [place] [address] [place], [place] [address]    Report Status 02/06/2021 FINAL  Final  SARS CORONAVIRUS 2 (TAT 6-24 HRS) Nasopharyngeal Nasopharyngeal Swab     Status: None  Collection Time: 02/02/21 12:09 AM   Specimen: Nasopharyngeal Swab  Result Value Ref Range Status   SARS Coronavirus 2 NEGATIVE NEGATIVE Final    Comment: (NOTE) SARS-CoV-2 target nucleic acids are NOT DETECTED.  The SARS-CoV-2 RNA is generally detectable in upper and lower respiratory specimens during the acute phase of infection. Negative results do not preclude SARS-CoV-2 infection, do not rule out co-infections with other pathogens, and should not be used as the sole basis for treatment or other patient management decisions. Negative results must be combined with clinical observations, patient history, and epidemiological information. The expected result is Negative.  Fact Sheet for Patients: SugarRoll.be  Fact Sheet for Healthcare Providers: https://www.woods-mathews.com/  This test is not yet approved or cleared by the [place] FDA and  has been authorized for detection and/or diagnosis of SARS-CoV-2 by FDA under an Emergency Use Authorization (EUA). This EUA will remain  in effect (meaning this test can be used) for the duration of the COVID-19 declaration under Se ction 564(b)(1) of the Act, 21 U.S.C. section 360bbb-3(b)(1), unless the authorization is terminated or revoked sooner.  Performed at [facility], [place] [address] [place], [place] [address]   Resp Panel by RT-PCR (Flu A&B, Covid) Nasopharyngeal Swab      Status: None   Collection Time: 02/06/21  3:12 PM   Specimen: Nasopharyngeal Swab; Nasopharyngeal(NP) swabs in vial transport medium  Result Value Ref Range Status   SARS Coronavirus 2 by RT PCR NEGATIVE NEGATIVE Final    Comment: (NOTE) SARS-CoV-2 target nucleic acids are NOT DETECTED.  The SARS-CoV-2 RNA is generally detectable in upper respiratory specimens during the acute phase of infection. The lowest concentration of SARS-CoV-2 viral copies this assay can detect is 138 copies/mL. A negative result does not preclude SARS-Cov-2 infection and should not be used as the sole basis for treatment or other patient management decisions. A negative result may occur with  improper specimen collection/handling, submission of specimen other than nasopharyngeal swab, presence of viral mutation(s) within the areas targeted by this assay, and inadequate number of viral copies(<138 copies/mL). A negative result must be combined with clinical observations, patient history, and epidemiological information. The expected result is Negative.  Fact Sheet for Patients:  EntrepreneurPulse.com.au  Fact Sheet for Healthcare Providers:  IncredibleEmployment.be  This test is no t yet approved or cleared by the [place] FDA and  has been authorized for detection and/or diagnosis of SARS-CoV-2 by FDA under an Emergency Use Authorization (EUA). This EUA will remain  in effect (meaning this test can be used) for the duration of the COVID-19 declaration under Section 564(b)(1) of the Act, 21 U.S.C.section 360bbb-3(b)(1), unless the authorization is terminated  or revoked sooner.       Influenza A by PCR NEGATIVE NEGATIVE Final   Influenza B by PCR NEGATIVE NEGATIVE Final    Comment: (NOTE) The Xpert Xpress SARS-CoV-2/FLU/RSV plus assay is intended as an aid in the diagnosis of influenza from Nasopharyngeal swab specimens and should not be used as a sole basis  for treatment. Nasal washings and aspirates are unacceptable for Xpert Xpress SARS-CoV-2/FLU/RSV testing.  Fact Sheet for Patients: EntrepreneurPulse.com.au  Fact Sheet for Healthcare Providers: IncredibleEmployment.be  This test is not yet approved or cleared by the [place] FDA and has been authorized for detection and/or diagnosis of SARS-CoV-2 by FDA under an Emergency Use Authorization (EUA). This EUA will remain in effect (meaning this test can be used) for the duration of the COVID-19 declaration under  Section 564(b)(1) of the Act, 21 U.S.C. section 360bbb-3(b)(1), unless the authorization is terminated or revoked.  Performed at [facility], [place] [address], [place], [place] [address]      Labs: BNP (last 3 results) No results for input(s): BNP in the last 8760 hours. Basic Metabolic Panel: Recent Labs  Lab 02/01/21 1856 02/02/21 0802 02/03/21 0149  NA 137  --  139  K 4.0  --  3.9  CL 104  --  107  CO2 28  --  28  GLUCOSE 111*  --  87  BUN 20  --  14  CREATININE 1.03 0.97 1.07  CALCIUM 9.3  --  9.0  MG  --   --  1.9  PHOS  --   --  3.6   Liver Function Tests: Recent Labs  Lab 02/03/21 0149  AST 14*  ALT 16  ALKPHOS 53  BILITOT 0.9  PROT 7.4  ALBUMIN 2.9*   No results for input(s): LIPASE, AMYLASE in the last 168 hours. No results for input(s): AMMONIA in the last 168 hours. CBC: Recent Labs  Lab 02/01/21 1856 02/02/21 0802 02/03/21 0149  WBC 6.1 5.5 6.0  NEUTROABS 3.6  --   --   HGB 12.8* 13.0 12.0*  HCT 42.2 44.5 38.6*  MCV 90.8 90.4 88.3  PLT 284 255 261   Cardiac Enzymes: No results for input(s): CKTOTAL, CKMB, CKMBINDEX, TROPONINI in the last 168 hours. BNP: Invalid input(s): POCBNP CBG: No results for input(s): GLUCAP in the last 168 hours. D-Dimer No results for input(s): DDIMER in the last 72 hours. Hgb A1c No results for input(s): HGBA1C in the last 72 hours. Lipid Profile No  results for input(s): CHOL, HDL, LDLCALC, TRIG, CHOLHDL, LDLDIRECT in the last 72 hours. Thyroid function studies No results for input(s): TSH, T4TOTAL, T3FREE, THYROIDAB in the last 72 hours.  Invalid input(s): FREET3 Anemia work up No results for input(s): VITAMINB12, FOLATE, FERRITIN, TIBC, IRON, RETICCTPCT in the last 72 hours. Urinalysis    Component Value Date/Time   COLORURINE AMBER (A) 09/13/2020 1052   APPEARANCEUR HAZY (A) 09/13/2020 1052   LABSPEC 1.025 09/13/2020 1052   PHURINE 5.0 09/13/2020 1052   GLUCOSEU NEGATIVE 09/13/2020 1052   HGBUR SMALL (A) 09/13/2020 1052   BILIRUBINUR NEGATIVE 09/13/2020 1052   KETONESUR NEGATIVE 09/13/2020 1052   PROTEINUR 100 (A) 09/13/2020 1052   NITRITE NEGATIVE 09/13/2020 1052   LEUKOCYTESUR NEGATIVE 09/13/2020 1052   Sepsis Labs Invalid input(s): PROCALCITONIN,  WBC,  LACTICIDVEN Microbiology Recent Results (from the past 240 hour(s))  Culture, blood (routine x 2)     Status: None   Collection Time: 02/01/21  8:07 PM   Specimen: BLOOD  Result Value Ref Range Status   Specimen Description BLOOD LEFT ANTECUBITAL  Final   Special Requests   Final    BOTTLES DRAWN AEROBIC AND ANAEROBIC Blood Culture results may not be optimal due to an inadequate volume of blood received in culture bottles   Culture   Final    NO GROWTH 5 DAYS Performed at [facility], [place] [address] [place], [place] [address]    Report Status 02/06/2021 FINAL  Final  Culture, blood (routine x 2)     Status: None   Collection Time: 02/01/21  9:09 PM   Specimen: BLOOD RIGHT HAND  Result Value Ref Range Status   Specimen Description BLOOD RIGHT HAND  Final   Special Requests   Final    BOTTLES DRAWN AEROBIC  AND ANAEROBIC Blood Culture results may not be optimal due to an inadequate volume of blood received in culture bottles   Culture   Final    NO GROWTH 5 DAYS Performed at [place] [address] [place], [place] [address]    Report Status  02/06/2021 FINAL  Final  SARS CORONAVIRUS 2 (TAT 6-24 HRS) Nasopharyngeal Nasopharyngeal Swab     Status: None   Collection Time: 02/02/21 12:09 AM   Specimen: Nasopharyngeal Swab  Result Value Ref Range Status   SARS Coronavirus 2 NEGATIVE NEGATIVE Final    Comment: (NOTE) SARS-CoV-2 target nucleic acids are NOT DETECTED.  The SARS-CoV-2 RNA is generally detectable in upper and lower respiratory specimens during the acute phase of infection. Negative results do not preclude SARS-CoV-2 infection, do not rule out co-infections with other pathogens, and should not be used as the sole basis for treatment or other patient management decisions. Negative results must be combined with clinical observations, patient history, and epidemiological information. The expected result is Negative.  Fact Sheet for Patients: SugarRoll.be  Fact Sheet for Healthcare Providers: https://www.woods-mathews.com/  This test is not yet approved or cleared by the [place] FDA and  has been authorized for detection and/or diagnosis of SARS-CoV-2 by FDA under an Emergency Use Authorization (EUA). This EUA will remain  in effect (meaning this test can be used) for the duration of the COVID-19 declaration under Se ction 564(b)(1) of the Act, 21 U.S.C. section 360bbb-3(b)(1), unless the authorization is terminated or revoked sooner.  Performed at [facility], [place] [address], [place], [place] [address]   Resp Panel by RT-PCR (Flu A&B, Covid) Nasopharyngeal Swab     Status: None   Collection Time: 02/06/21  3:12 PM   Specimen: Nasopharyngeal Swab; Nasopharyngeal(NP) swabs in vial transport medium  Result Value Ref Range Status   SARS Coronavirus 2 by RT PCR NEGATIVE NEGATIVE Final    Comment: (NOTE) SARS-CoV-2 target nucleic acids are NOT DETECTED.  The SARS-CoV-2 RNA is generally detectable in upper respiratory specimens during the acute phase of  infection. The lowest concentration of SARS-CoV-2 viral copies this assay can detect is 138 copies/mL. A negative result does not preclude SARS-Cov-2 infection and should not be used as the sole basis for treatment or other patient management decisions. A negative result may occur with  improper specimen collection/handling, submission of specimen other than nasopharyngeal swab, presence of viral mutation(s) within the areas targeted by this assay, and inadequate number of viral copies(<138 copies/mL). A negative result must be combined with clinical observations, patient history, and epidemiological information. The expected result is Negative.  Fact Sheet for Patients:  EntrepreneurPulse.com.au  Fact Sheet for Healthcare Providers:  IncredibleEmployment.be  This test is no t yet approved or cleared by the [place] FDA and  has been authorized for detection and/or diagnosis of SARS-CoV-2 by FDA under an Emergency Use Authorization (EUA). This EUA will remain  in effect (meaning this test can be used) for the duration of the COVID-19 declaration under Section 564(b)(1) of the Act, 21 U.S.C.section 360bbb-3(b)(1), unless the authorization is terminated  or revoked sooner.       Influenza A by PCR NEGATIVE NEGATIVE Final   Influenza B by PCR NEGATIVE NEGATIVE Final    Comment: (NOTE) The Xpert Xpress SARS-CoV-2/FLU/RSV plus assay is intended as an aid in the diagnosis of influenza from Nasopharyngeal swab specimens and should not be used as a sole basis for treatment. Nasal washings and  aspirates are unacceptable for Xpert Xpress SARS-CoV-2/FLU/RSV testing.  Fact Sheet for Patients: EntrepreneurPulse.com.au  Fact Sheet for Healthcare Providers: IncredibleEmployment.be  This test is not yet approved or cleared by the [place] FDA and has been authorized for detection and/or diagnosis of SARS-CoV-2  by FDA under an Emergency Use Authorization (EUA). This EUA will remain in effect (meaning this test can be used) for the duration of the COVID-19 declaration under Section 564(b)(1) of the Act, 21 U.S.C. section 360bbb-3(b)(1), unless the authorization is terminated or revoked.  Performed at [facility], [place] [address], [place], [place] [address]      Time coordinating discharge: Over 30 minutes  SIGNED:   Shawna Clamp, MD  [facility] 02/07/2021, 8:33 AM Pager   If 7PM-7AM, please contact night-coverage [email or web address]

## 2021-02-07 MED ORDER — HEPARIN SOD (PORK) LOCK FLUSH 100 UNIT/ML IV SOLN
250.0000 [IU] | INTRAVENOUS | Status: AC | PRN
  Administered 2021-02-07: 250 [IU]
  Filled 2021-02-07: qty 2.5

## 2021-02-07 NOTE — Plan of Care (Signed)
Patient discharged to go to [facility] facility. Ex wife updated.

## 2021-02-07 NOTE — TOC Transition Note (Signed)
Transition of Care [facility]) - CM/SW Discharge Note   Patient Details  Name: Tommy Bauer MRN: [id number] Date of Birth: [birth year]  Transition of Care [facility] CM/SW Contact:  Tommy Bauer Phone Number: 02/07/2021, 10:42 AM   Clinical Narrative:    Patient will Discharge To: [place] Date:02/07/21 Family Notified:yes, Tommy Bauer, ex wife (POA) Transport By: Tommy Bauer   Per MD patient ready for DC to [facility] . RN, patient, patient's family, and facility notified of DC. Assessment, Fl2/Pasrr, and Discharge Summary sent to facility. RN given number for report [phone], Room # 206). DC packet on chart. Ambulance transport requested for patient at 12:00pm.  CSW signing off.  Tommy Bauer LCSWA [phone]     Final next level of care: Skilled Nursing Facility Barriers to Discharge: No Barriers Identified   Patient Goals and CMS Choice        Discharge Placement              Patient chooses bed at: [facility] Patient to be transferred to facility by: [place] Name of family member notified: Tommy Bauer, Ex wife (POA) Patient and family notified of of transfer: 02/07/21  Discharge Plan and Services                DME Arranged: N/A DME Agency: NA       HH Arranged: NA          Social Determinants of Health (SDOH) Interventions     Readmission Risk Interventions No flowsheet data found.

## 2021-02-07 NOTE — Progress Notes (Signed)
Report given to [facility] LPN

## 2021-02-07 NOTE — Progress Notes (Signed)
PTAR here to pick up patient. Called [facility] to give report. Nurse unavailable. Left my direct  telephone number for nurse to call me back.

## 2021-02-07 NOTE — Care Plan (Signed)
Mr. Tommy Bauer was supposed to be discharged to [facility] yesterday but it was canceled because of delayed covid test results.  Patient is going to be discharged today to [facility].  Patient was seen and appears comfortable,  denies any concerns.  Patient has a PICC line, Patient will get ceftriaxone and daptomycin at nursing home for 6 weeks.

## 2021-02-07 NOTE — Discharge Instructions (Signed)
Advised to follow-up with primary care physician in 1 week. Advised to follow-up wit plastic surgeon for scalp wound. Patient has been discharged with a PICC line to continue IV antibiotic for 6 weeks. Advised to take ceftriaxone 2 g daily and daptomycin 750 mg daily. Advised to follow-up with infectious disease Dr. In 4 weeks.

## 2021-02-08 DIAGNOSIS — Z792 Long term (current) use of antibiotics: Secondary | ICD-10-CM | POA: Diagnosis not present

## 2021-02-08 DIAGNOSIS — S0100XD Unspecified open wound of scalp, subsequent encounter: Secondary | ICD-10-CM | POA: Diagnosis not present

## 2021-02-08 DIAGNOSIS — F0390 Unspecified dementia without behavioral disturbance: Secondary | ICD-10-CM | POA: Diagnosis not present

## 2021-02-11 ENCOUNTER — Emergency Department (HOSPITAL_COMMUNITY)
Admission: EM | Admit: 2021-02-11 | Discharge: 2021-02-11 | Disposition: A | Discharge status: Home / Self Care | Payer: Medicare Other | Attending: Emergency Medicine | Admitting: Emergency Medicine

## 2021-02-11 ENCOUNTER — Emergency Department: Payer: Self-pay

## 2021-02-11 DIAGNOSIS — Z743 Need for continuous supervision: Secondary | ICD-10-CM | POA: Diagnosis not present

## 2021-02-11 DIAGNOSIS — R279 Unspecified lack of coordination: Secondary | ICD-10-CM | POA: Diagnosis not present

## 2021-02-11 DIAGNOSIS — Z8546 Personal history of malignant neoplasm of prostate: Secondary | ICD-10-CM | POA: Insufficient documentation

## 2021-02-11 DIAGNOSIS — Z79899 Other long term (current) drug therapy: Secondary | ICD-10-CM | POA: Diagnosis not present

## 2021-02-11 DIAGNOSIS — R410 Disorientation, unspecified: Secondary | ICD-10-CM | POA: Diagnosis not present

## 2021-02-11 DIAGNOSIS — S0100XA Unspecified open wound of scalp, initial encounter: Secondary | ICD-10-CM | POA: Insufficient documentation

## 2021-02-11 DIAGNOSIS — X58XXXA Exposure to other specified factors, initial encounter: Secondary | ICD-10-CM | POA: Insufficient documentation

## 2021-02-11 DIAGNOSIS — Z452 Encounter for adjustment and management of vascular access device: Secondary | ICD-10-CM | POA: Diagnosis not present

## 2021-02-11 DIAGNOSIS — Z7982 Long term (current) use of aspirin: Secondary | ICD-10-CM | POA: Diagnosis not present

## 2021-02-11 DIAGNOSIS — R0902 Hypoxemia: Secondary | ICD-10-CM | POA: Diagnosis not present

## 2021-02-11 DIAGNOSIS — I10 Essential (primary) hypertension: Secondary | ICD-10-CM | POA: Insufficient documentation

## 2021-02-11 DIAGNOSIS — Z87891 Personal history of nicotine dependence: Secondary | ICD-10-CM | POA: Insufficient documentation

## 2021-02-11 DIAGNOSIS — R4182 Altered mental status, unspecified: Secondary | ICD-10-CM | POA: Diagnosis not present

## 2021-02-11 DIAGNOSIS — R69 Illness, unspecified: Secondary | ICD-10-CM | POA: Diagnosis not present

## 2021-02-11 DIAGNOSIS — S0190XA Unspecified open wound of unspecified part of head, initial encounter: Secondary | ICD-10-CM

## 2021-02-11 DIAGNOSIS — R5381 Other malaise: Secondary | ICD-10-CM | POA: Diagnosis not present

## 2021-02-11 DIAGNOSIS — S0180XA Unspecified open wound of other part of head, initial encounter: Secondary | ICD-10-CM | POA: Diagnosis not present

## 2021-02-11 LAB — CBC WITH DIFFERENTIAL/PLATELET
Abs Immature Granulocytes: 0.02 10*3/uL (ref 0.00–0.07)
Basophils Absolute: 0 10*3/uL (ref 0.0–0.1)
Basophils Relative: 1 %
Eosinophils Absolute: 0.7 10*3/uL — ABNORMAL HIGH (ref 0.0–0.5)
Eosinophils Relative: 10 %
HCT: 40.5 % (ref 39.0–52.0)
Hemoglobin: 12.3 g/dL — ABNORMAL LOW (ref 13.0–17.0)
Immature Granulocytes: 0 %
Lymphocytes Relative: 20 %
Lymphs Abs: 1.4 10*3/uL (ref 0.7–4.0)
MCH: 27 pg (ref 26.0–34.0)
MCHC: 30.4 g/dL (ref 30.0–36.0)
MCV: 88.8 fL (ref 80.0–100.0)
Monocytes Absolute: 0.6 10*3/uL (ref 0.1–1.0)
Monocytes Relative: 9 %
Neutro Abs: 4.1 10*3/uL (ref 1.7–7.7)
Neutrophils Relative %: 60 %
Platelets: 254 10*3/uL (ref 150–400)
RBC: 4.56 MIL/uL (ref 4.22–5.81)
RDW: 16.4 % — ABNORMAL HIGH (ref 11.5–15.5)
WBC: 6.8 10*3/uL (ref 4.0–10.5)
nRBC: 0 % (ref 0.0–0.2)

## 2021-02-11 LAB — BASIC METABOLIC PANEL
Anion gap: 6 (ref 5–15)
BUN: 14 mg/dL (ref 8–23)
CO2: 27 mmol/L (ref 22–32)
Calcium: 9 mg/dL (ref 8.9–10.3)
Chloride: 105 mmol/L (ref 98–111)
Creatinine, Ser: 0.91 mg/dL (ref 0.61–1.24)
GFR, Estimated: >60 mL/min (ref >60)
Glucose, Bld: 94 mg/dL (ref 70–99)
Potassium: 4.4 mmol/L (ref 3.5–5.1)
Sodium: 138 mmol/L (ref 135–145)

## 2021-02-11 MED ORDER — SODIUM CHLORIDE 0.9% FLUSH
10.0000 mL | INTRAVENOUS | Status: DC | PRN
Start: 2021-02-11 — End: 2021-02-12

## 2021-02-11 MED ORDER — SODIUM CHLORIDE 0.9% FLUSH: 10.0000 mL | Freq: Two times a day (BID) | INTRAVENOUS | Status: DC

## 2021-02-11 MED ORDER — CHLORHEXIDINE GLUCONATE CLOTH 2 % EX PADS: 6.0000 | MEDICATED_PAD | Freq: Every day | CUTANEOUS | Status: DC

## 2021-02-11 NOTE — ED Notes (Addendum)
Pt wife reported pt is confuse than normal however pt has h of dementia  and is already being treated on antibiotics, This RN evaluated pt where he is Axo3 and informed MD

## 2021-02-11 NOTE — ED Notes (Signed)
PTAR CALLED BY Masud Holub PT IS NUMBER [phone] ON THE LIST

## 2021-02-11 NOTE — ED Provider Notes (Signed)
Blood pressure (!) 165/151, pulse 94, temperature 98.6 F (37 C), temperature source Oral, resp. rate 16, height 6' (1.829 m), weight 102.1 kg, SpO2 93 %.  Assuming care from Dr. Johnney Killian.  In short, Tommy Bauer is a 80 y.o. male with a chief complaint of Vascular Access Problem (Pulled out ) .  Refer to the original H&P for additional details.  The current plan of care is to follow-up after PICC line placement.  PICC line has been replaced and functioning well per nursing.  I went to assess the patient and spoke with the patient's ex-wife at bedside.  The patient is able to tell me who she is and that he is in the hospital.  He does seem mildly confused but also has a history of this.  His lab work is reassuring.  He is afebrile here with no abnormal vital signs.  He has a normal neurologic exam for me here.  Suspect he may be slightly more confused being thrown off his routine.  We will return him to the nursing facility to continue antibiotics and have them monitor.  The wife plans to visit him again tomorrow to reassess. She is comfortable with the plan at discharge.     Margette Fast, MD 02/11/21 1721

## 2021-02-11 NOTE — ED Provider Notes (Signed)
[name] EMERGENCY DEPARTMENT Provider Note   CSN: [id number] Arrival date & time: 02/11/21  1031     History Chief Complaint  Patient presents with  . Vascular Access Problem    Pulled out     Tommy Bauer is a 80 y.o. male.  HPI Patient sent to the emergency department for replacement of PICC line.  Patient is coming from [facility].  He has been getting peripheral IV antibiotics due to a scalp infection and osteomyelitis.  Reportedly he pulled the PICC line out.  Reportedly patient's mental status is at baseline and there are no other acute concerns.  Patient was not sure why he was sent to the emergency department.  He reports he feels relatively okay.  He does note that his feet and big toes hurt.  He had an unusual explanation of how he got the wound on his head.  He pointed out a healed scar that is in the medial aspect of his left knee.  He described pulling something out of it and then placing that on both sides of his scalp.  The patient is alert he is in no distress.  This appears to be some fixed psychosis, possibly.  At this point, by chart review I have no indication of what precipitated the scalp wound.  It appears it was longstanding before it became advanced to the point of needing incision and drainage and having osteomyelitis with a nonhealing wound.    Past Medical History:  Diagnosis Date  . ADENOCARCINOMA, PROSTATE 05/19/2010  . BACK PAIN 07/16/2008  . Carpal tunnel syndrome 10/15/2008  . COLONIC POLYPS, HX OF 06/08/2007  . Contusion    left shoulder,left knee,s/p recent fall end of march 2013  . CVA (cerebral vascular accident) (HCC)   . ERECTILE DYSFUNCTION, ORGANIC 07/11/2007  . Headache(784.0) 07/11/2007  . HYPERTENSION 06/08/2007  . INGUINAL HERNIA 12/04/2007  . LEFT VENTRICULAR FUNCTION, DECREASED 11/12/2007  . Prostate cancer (HCC) 09/14/2009   prostateectomy,adenocarcinoma,gleason=4+3=7  . PROSTATE SPECIFIC ANTIGEN, ELEVATED  05/06/2009  . TOBACCO ABUSE 11/06/2009    Patient Active Problem List   Diagnosis Date Noted  . Scalp wound 02/02/2021  . Abscess or cellulitis of scalp 02/02/2021  . Osteomyelitis (HCC) 02/02/2021  . Bradycardia 02/02/2021  . Hypoxia, sleep related 02/02/2021  . Prolonged QT interval 02/02/2021  . Hyperlipidemia 02/02/2021  . History of stroke 02/02/2021  . Atrioventricular block, Mobitz type 1, Wenckebach   . Memory loss 12/14/2020  . Gait abnormality 12/14/2020  . Abnormal EEG 12/14/2020  . Hypothermia 09/17/2020  . AMS (altered mental status) 09/13/2020  . AKI (acute kidney injury) (HCC) 09/13/2020  . Conjunctivitis 09/13/2020  . Rhabdomyolysis 09/13/2020  . Central retinal artery occlusion 07/15/2015  . 80 year-old gentleman s/p radical prostatectomy for pT3a adenocarcinoma with a Gleason's Score of 4+3, with extracapsular extension noted on pathology, and rising PSA of 0.36 05/19/2010  . TOBACCO ABUSE 11/06/2009  . CARPAL TUNNEL SYNDROME 10/15/2008  . BACK PAIN 07/16/2008  . INGUINAL HERNIA 12/04/2007  . Cardiovascular disease 11/12/2007  . ERECTILE DYSFUNCTION, ORGANIC 07/11/2007  . HEADACHE 07/11/2007  . Essential hypertension 06/08/2007  . History of colonic polyps 06/08/2007    Past Surgical History:  Procedure Laterality Date  . HERNIA REPAIR     right ingunial  . PROSTATE SURGERY  09/14/2009   radical prostatectomy /gleason=4+3-7       Family History  Problem Relation Age of Onset  . Colon cancer Neg Hx  Social History   Tobacco Use  . Smoking status: Former Smoker    Years: 60.00    Types: Cigarettes  . Smokeless tobacco: Never Used  Substance Use Topics  . Alcohol use: Not Currently  . Drug use: No    Home Medications Prior to Admission medications   Medication Sig Start Date End Date Taking? Authorizing Provider  acetaminophen (TYLENOL) 500 MG tablet Take 500 mg by mouth every 4 (four) hours as needed for moderate pain.     [provider]  ammonium lactate (AMLACTIN) 12 % cream Apply topically at bedtime. Apply to lower legs and feet    [provider]  aspirin 81 MG tablet Take 1 tablet (81 mg total) by mouth daily. 07/15/15   Marletta Lor, MD  atorvastatin (LIPITOR) 40 MG tablet Take 1 tablet (40 mg total) by mouth daily. 07/30/19   Nafziger, Tommi Rumps, NP  bimatoprost (LUMIGAN) 0.03 % ophthalmic solution Place 1 drop into both eyes at bedtime.    [provider]  cefTRIAXone (ROCEPHIN) IVPB Inject 2 g into the vein daily. Indication:  Scalp osteomyelitis First Dose: Yes Last Day of Therapy:  03/15/2021 Labs - Once weekly:  CBC/D and BMP, Labs - Every other week:  ESR and CRP Method of administration: IV Push Method of administration may be changed at the discretion of home infusion pharmacist based upon assessment of the patient and/or caregiver's ability to self-administer the medication ordered. 02/06/21 03/16/21  Shawna Clamp, MD  daptomycin (CUBICIN) IVPB Inject 750 mg into the vein daily. Indication: Scalp osteomyelitis First Dose: Yes Last Day of Therapy:  03/15/2021 Labs - Once weekly:  CBC/D, BMP, and CPK Labs - Every other week:  ESR and CRP Method of administration: IV Push Method of administration may be changed at the discretion of home infusion pharmacist based upon assessment of the patient and/or caregiver's ability to self-administer the medication ordered. 02/06/21 03/16/21  Shawna Clamp, MD  Diclofenac Sodium 3 % GEL Apply 1 application topically See admin instructions. To both knees bid x 4 days    [provider]  folic acid (FOLVITE) 1 MG tablet Take 1 mg by mouth daily.    [provider]  furosemide (LASIX) 40 MG tablet Take 40 mg by mouth.    [provider]  hydrALAZINE (APRESOLINE) 25 MG tablet Take 1 tablet (25 mg total) by mouth every 8 (eight) hours. 02/06/21   Shawna Clamp, MD  [employer] [facility] EX) Apply 1 application  topically daily. Apply to scrotum and sacrum    [provider]  Multiple Vitamin (MULTIVITAMIN) tablet Take 1 tablet by mouth daily.    [provider]  potassium chloride (KLOR-CON) 20 MEQ packet Take 40 mEq by mouth daily. When taking lasix    [provider]  Skin Protectants, Misc. (EUCERIN) cream Apply 1 application topically daily. Apply to extremities,trunk and back after bath    [provider]  thiamine 100 MG tablet Take 100 mg by mouth daily.    [provider]    Allergies    Patient has no known allergies.  Review of Systems   Review of Systems Level 5 caveat cannot obtain review of systems due to dementia or poor comprehension of medical condition Physical Exam Updated Vital Signs BP (!) 172/122 (BP Location: Left Arm)   Pulse 91   Temp 98.6 F (37 C) (Oral)   Resp 16   Ht 6' (1.829 m)   Wt 102.1  kg   SpO2 95%   BMI 30.52 kg/m   Physical Exam Constitutional:      Comments: Patient is alert.  He is not exhibiting any distress.  He is appropriately interactive with me.  He responds to commands.  No respiratory distress  HENT:     Head:     Comments: Patient has an approximately 10 cm elliptical wound on the temporal side of his right scalp.  This has a dry and scaling crust of yellow material.  Just a small amount of purulent material at 1 edge.  This is not inflamed or swollen.  It is essentially dry but not really skin.    Nose: Nose normal.     Mouth/Throat:     Pharynx: Oropharynx is clear.  Eyes:     Extraocular Movements: Extraocular movements intact.  Cardiovascular:     Rate and Rhythm: Normal rate and regular rhythm.  Pulmonary:     Effort: Pulmonary effort is normal.     Breath sounds: Normal breath sounds.  Abdominal:     General: There is no distension.     Palpations: Abdomen is soft.     Tenderness: There is no abdominal tenderness. There is no guarding.  Musculoskeletal:     Comments: Both feet  have severe onychomycosis and thickening he endorses pain diffusely over the feet.  No acute wounds.  The feet are warm and dry.  Only trace edema of the lower legs.  Skin is thin and shiny  Skin:    General: Skin is warm and dry.  Neurological:     Comments: Patient is alert and responsive.  He follows commands.  No focal motor deficits.  His verbal interactions suggest mild dementia or possible cognitive delay.  Psychiatric:        Mood and Affect: Mood normal.     ED Results / Procedures / Treatments   Labs (all labs ordered are listed, but only abnormal results are displayed) Labs Reviewed  BASIC METABOLIC PANEL  CBC WITH DIFFERENTIAL/PLATELET    EKG None  Radiology [place] EKG SITE RITE  Result Date: 02/11/2021 If Site Rite image not attached, placement could not be confirmed due to current cardiac rhythm.   Procedures Procedures   Medications Ordered in ED Medications - No data to display  ED Course  I have reviewed the triage vital signs and the nursing notes.  Pertinent labs & imaging results that were available during my care of the patient were reviewed by me and considered in my medical decision making (see chart for details).    MDM Rules/Calculators/A&P                         Patient is stable.  No acute problems.  PICC line is being placed by PICC line consultants.  Anticipate discharge after placement.  Patient can get his antibiotics as planned at the nursing facility.  Dr. Laverta Baltimore aware of the plan.  Once procedure complete and post procedure chest x-ray obtained, anticipate discharge.  Final Clinical Impression(s) / ED Diagnoses Final diagnoses:  Needs peripherally inserted central catheter (PICC)  Chronic wound of head    Rx / DC Orders ED Discharge Orders    None       Charlesetta Shanks, MD 02/11/21 1544

## 2021-02-11 NOTE — ED Notes (Signed)
This RN called [facility] care center and they are aware pt is returning back to facility

## 2021-02-11 NOTE — ED Triage Notes (Signed)
Pt to ED via EMS from [facility] C/O Pt pulled his PICC line out on right arm. Bleeding controlled. Pt voicing no complaints. Orientation at baseline. No medications given by EMS. Last VS : bp 144/84, p64, 96%RA, CBG 127, Temp 98.1

## 2021-02-11 NOTE — ED Notes (Signed)
Per IV team Tommy Bauer) pt does not need an ray for placement due to use of ECG during placement of picc line

## 2021-02-11 NOTE — Progress Notes (Signed)
Peripherally Inserted Central Catheter Placement  The IV Nurse has discussed with the patient and/or persons authorized to consent for the patient, the purpose of this procedure and the potential benefits and risks involved with this procedure.  The benefits include less needle sticks, lab draws from the catheter, and the patient may be discharged home with the catheter. Risks include, but not limited to, infection, bleeding, blood clot (thrombus formation), and puncture of an artery; nerve damage and irregular heartbeat and possibility to perform a PICC exchange if needed/ordered by physician.  Alternatives to this procedure were also discussed.  Bard Power PICC patient education guide, fact sheet on infection prevention and patient information card has been provided to patient /or left at bedside.    PICC Placement Documentation  PICC Single Lumen 02/11/21 Right 42 cm 0 cm (Active)  Indication for Insertion or Continuance of Line Home intravenous therapies (PICC only) 02/11/21 1500  Exposed Catheter (cm) 0 cm 02/11/21 1500  Site Assessment Clean;Dry;Intact 02/11/21 1500  Line Status Flushed;Saline locked 02/11/21 1500  Dressing Type Transparent;Securing device 02/11/21 1500  Dressing Status Clean;Dry;Intact 02/11/21 1500  Antimicrobial disc in place? Yes 02/11/21 1500  Safety Lock Not Applicable 01/00/71 2197  Line Care Connections checked and tightened 02/11/21 1500  Dressing Intervention New dressing 02/11/21 1500  Dressing Change Due 02/18/21 02/11/21 1500       Holley Bouche Renee 02/11/2021, 3:14 PM

## 2021-02-11 NOTE — Discharge Instructions (Signed)
1.  Resume use of the PICC line as previously. 2.  PICC line placed in the emergency department, no antibiotics administered in the emergency department.  Resume antibiotics today per patient schedule.

## 2021-02-11 NOTE — ED Notes (Signed)
Pt is d/c by MD and is provided w/ d/c instructions, Pt is transferred back to facility by [facility]

## 2021-02-11 NOTE — ED Notes (Signed)
This RN offer pt food and drinks, Pt refuse at this time

## 2021-02-12 ENCOUNTER — Emergency Department: Payer: Self-pay

## 2021-02-12 ENCOUNTER — Emergency Department (HOSPITAL_COMMUNITY): Payer: Medicare Other

## 2021-02-12 ENCOUNTER — Emergency Department (HOSPITAL_COMMUNITY)
Admission: EM | Admit: 2021-02-12 | Discharge: 2021-02-13 | Disposition: A | Discharge status: Home / Self Care | Payer: Medicare Other | Attending: Emergency Medicine | Admitting: Emergency Medicine

## 2021-02-12 DIAGNOSIS — Z79899 Other long term (current) drug therapy: Secondary | ICD-10-CM | POA: Diagnosis not present

## 2021-02-12 DIAGNOSIS — R0902 Hypoxemia: Secondary | ICD-10-CM | POA: Diagnosis not present

## 2021-02-12 DIAGNOSIS — I499 Cardiac arrhythmia, unspecified: Secondary | ICD-10-CM | POA: Diagnosis not present

## 2021-02-12 DIAGNOSIS — Z7982 Long term (current) use of aspirin: Secondary | ICD-10-CM | POA: Diagnosis not present

## 2021-02-12 DIAGNOSIS — Z452 Encounter for adjustment and management of vascular access device: Secondary | ICD-10-CM | POA: Insufficient documentation

## 2021-02-12 DIAGNOSIS — T82898A Other specified complication of vascular prosthetic devices, implants and grafts, initial encounter: Secondary | ICD-10-CM | POA: Diagnosis not present

## 2021-02-12 DIAGNOSIS — I10 Essential (primary) hypertension: Secondary | ICD-10-CM | POA: Diagnosis not present

## 2021-02-12 DIAGNOSIS — Z8546 Personal history of malignant neoplasm of prostate: Secondary | ICD-10-CM | POA: Diagnosis not present

## 2021-02-12 DIAGNOSIS — I491 Atrial premature depolarization: Secondary | ICD-10-CM | POA: Diagnosis not present

## 2021-02-12 DIAGNOSIS — Z95828 Presence of other vascular implants and grafts: Secondary | ICD-10-CM

## 2021-02-12 DIAGNOSIS — Z87891 Personal history of nicotine dependence: Secondary | ICD-10-CM | POA: Insufficient documentation

## 2021-02-12 MED ORDER — LIDOCAINE HCL (PF) 1 % IJ SOLN
INTRAMUSCULAR | Status: AC
  Administered 2021-02-12: 5 mL
  Filled 2021-02-12: qty 30

## 2021-02-12 MED ORDER — HEPARIN SOD (PORK) LOCK FLUSH 100 UNIT/ML IV SOLN
INTRAVENOUS | Status: AC
  Administered 2021-02-12: 5 [IU]
  Filled 2021-02-12: qty 5

## 2021-02-12 NOTE — Discharge Instructions (Addendum)
Consider Posey mitts to keep patient from removing PICC.

## 2021-02-12 NOTE — ED Notes (Signed)
Pt to IR

## 2021-02-12 NOTE — Procedures (Signed)
Interventional Radiology Procedure Note  Procedure: PICC placement  Findings: Please refer to procedural dictation for full description. 5 Fr single lumen PICC, right brachial vein, 40 cm.  Complications: None immediate  Estimated Blood Loss: < 5 mL  Recommendations: PICC ready for immediate use.   Ruthann Cancer, MD

## 2021-02-12 NOTE — ED Provider Notes (Signed)
[facility] DEPT Provider Note   CSN: [id number] Arrival date & time: 02/12/21  5284     History Chief Complaint  Patient presents with  . Vascular Access Problem    Jiyaan Steinhauser is a 80 y.o. male.  80 year old male brought in from facility after accidentally removing his PICC line which was placed to the emergency room yesterday.  Patient has a history of scalp infection with osteomyelitis, is currently receiving antibiotics via PICC line.  Patient is a poor historian at baseline, is unable to provide further details as to removal of his PICC line.        Past Medical History:  Diagnosis Date  . ADENOCARCINOMA, PROSTATE 05/19/2010  . BACK PAIN 07/16/2008  . Carpal tunnel syndrome 10/15/2008  . COLONIC POLYPS, HX OF 06/08/2007  . Contusion    left shoulder,left knee,s/p recent fall end of march 2013  . CVA (cerebral vascular accident) ([place])   . ERECTILE DYSFUNCTION, ORGANIC 07/11/2007  . Headache(784.0) 07/11/2007  . HYPERTENSION 06/08/2007  . INGUINAL HERNIA 12/04/2007  . LEFT VENTRICULAR FUNCTION, DECREASED 11/12/2007  . Prostate cancer ([place]) 09/14/2009   prostateectomy,adenocarcinoma,gleason=4+3=7  . PROSTATE SPECIFIC ANTIGEN, ELEVATED 05/06/2009  . TOBACCO ABUSE 11/06/2009    Patient Active Problem List   Diagnosis Date Noted  . Scalp wound 02/02/2021  . Abscess or cellulitis of scalp 02/02/2021  . Osteomyelitis ([place]) 02/02/2021  . Bradycardia 02/02/2021  . Hypoxia, sleep related 02/02/2021  . Prolonged QT interval 02/02/2021  . Hyperlipidemia 02/02/2021  . History of stroke 02/02/2021  . Atrioventricular block, Mobitz type 1, Wenckebach   . Memory loss 12/14/2020  . Gait abnormality 12/14/2020  . Abnormal EEG 12/14/2020  . Hypothermia 09/17/2020  . AMS (altered mental status) 09/13/2020  . AKI (acute kidney injury) ([place]) 09/13/2020  . Conjunctivitis 09/13/2020  . Rhabdomyolysis 09/13/2020  . Central retinal artery occlusion 07/15/2015   . 80 year-old gentleman s/p radical prostatectomy for pT3a adenocarcinoma with a Gleason's Score of 4+3, with extracapsular extension noted on pathology, and rising PSA of 0.36 05/19/2010  . TOBACCO ABUSE 11/06/2009  . CARPAL TUNNEL SYNDROME 10/15/2008  . BACK PAIN 07/16/2008  . INGUINAL HERNIA 12/04/2007  . Cardiovascular disease 11/12/2007  . ERECTILE DYSFUNCTION, ORGANIC 07/11/2007  . HEADACHE 07/11/2007  . Essential hypertension 06/08/2007  . History of colonic polyps 06/08/2007    Past Surgical History:  Procedure Laterality Date  . HERNIA REPAIR     right ingunial  . PROSTATE SURGERY  09/14/2009   radical prostatectomy /gleason=4+3-7       Family History  Problem Relation Age of Onset  . Colon cancer Neg Hx     Social History   Tobacco Use  . Smoking status: Former Smoker    Years: 60.00    Types: Cigarettes  . Smokeless tobacco: Never Used  Substance Use Topics  . Alcohol use: Not Currently  . Drug use: No    Home Medications Prior to Admission medications   Medication Sig Start Date End Date Taking? Authorizing Provider  acetaminophen (TYLENOL) 500 MG tablet Take 500 mg by mouth every 4 (four) hours as needed for moderate pain.    Yes [provider]  ammonium lactate (AMLACTIN) 12 % cream Apply topically at bedtime. Apply to lower legs and feet   Yes [provider]  aspirin 81 MG tablet Take 1 tablet (81 mg total) by mouth daily. 07/15/15  Yes Marletta Lor, MD  atorvastatin (LIPITOR) 40 MG tablet Take 1 tablet (40  mg total) by mouth daily. 07/30/19  Yes Nafziger, Tommi Rumps, NP  bimatoprost (LUMIGAN) 0.03 % ophthalmic solution Place 1 drop into both eyes at bedtime.   Yes [provider]  cefTRIAXone (ROCEPHIN) IVPB Inject 2 g into the vein daily. Indication:  Scalp osteomyelitis First Dose: Yes Last Day of Therapy:  03/15/2021 Labs - Once weekly:  CBC/D and BMP, Labs - Every other week:  ESR and CRP Method of administration:  IV Push Method of administration may be changed at the discretion of home infusion pharmacist based upon assessment of the patient and/or caregiver's ability to self-administer the medication ordered. 02/06/21 03/16/21 Yes Shawna Clamp, MD  daptomycin (CUBICIN) IVPB Inject 750 mg into the vein daily. Indication: Scalp osteomyelitis First Dose: Yes Last Day of Therapy:  03/15/2021 Labs - Once weekly:  CBC/D, BMP, and CPK Labs - Every other week:  ESR and CRP Method of administration: IV Push Method of administration may be changed at the discretion of home infusion pharmacist based upon assessment of the patient and/or caregiver's ability to self-administer the medication ordered. 02/06/21 03/16/21 Yes Shawna Clamp, MD  Diclofenac Sodium 3 % GEL Apply 1 application topically 4 (four) times daily. To bilateral knees   Yes [provider]  folic acid (FOLVITE) 1 MG tablet Take 1 mg by mouth daily.   Yes [provider]  furosemide (LASIX) 40 MG tablet Take 40 mg by mouth daily.   Yes [provider]  hydrALAZINE (APRESOLINE) 25 MG tablet Take 1 tablet (25 mg total) by mouth every 8 (eight) hours. 02/06/21  Yes Shawna Clamp, MD  Infant Care Products [facility] EX) Apply 1 application topically daily. Apply to scrotum and sacrum   Yes [provider]  Multiple Vitamin (MULTIVITAMIN) tablet Take 1 tablet by mouth daily.   Yes [provider]  potassium chloride (KLOR-CON) 20 MEQ packet Take 40 mEq by mouth daily. When taking lasix   Yes [provider]  Skin Protectants, Misc. (EUCERIN) cream Apply 1 application topically daily. Apply to extremities,trunk and back after bath   Yes [provider]  thiamine 100 MG tablet Take 100 mg by mouth daily.   Yes [provider]    Allergies    Patient has no known allergies.  Review of Systems   Review of Systems  Unable to perform ROS: Dementia    Physical Exam Updated Vital  Signs BP (!) 177/121   Pulse (!) 37   Temp 99.5 F (37.5 C) (Oral)   Resp 18   Ht 6' (1.829 m)   Wt 102.1 kg   SpO2 96%   BMI 30.53 kg/m   Physical Exam Vitals and nursing note reviewed.  Constitutional:      General: He is not in acute distress.    Appearance: He is well-developed. He is not diaphoretic.    HENT:     Head: Normocephalic and atraumatic.  Cardiovascular:     Rate and Rhythm: Normal rate and regular rhythm.  Pulmonary:     Effort: Pulmonary effort is normal.     Breath sounds: Normal breath sounds.  Abdominal:     Palpations: Abdomen is soft.     Tenderness: There is no abdominal tenderness.  Skin:    General: Skin is warm and dry.  Neurological:     Mental Status: He is alert. Mental status is at baseline.  Psychiatric:        Behavior: Behavior normal.     ED Results / Procedures /  Treatments   Labs (all labs ordered are listed, but only abnormal results are displayed) Labs Reviewed - No data to display  EKG None  Radiology [place] EKG SITE RITE  Result Date: 02/12/2021 If Site Rite image not attached, placement could not be confirmed due to current cardiac rhythm.  [place] EKG SITE RITE  Result Date: 02/11/2021 If Site Rite image not attached, placement could not be confirmed due to current cardiac rhythm.   Procedures Procedures   Medications Ordered in ED Medications  heparin lock flush 100 UNIT/ML injection (5 Units  Given 02/12/21 1502)  lidocaine (PF) (XYLOCAINE) 1 % injection (5 mLs  Given 02/12/21 1502)    ED Course  I have reviewed the triage vital signs and the nursing notes.  Pertinent labs & imaging results that were available during my care of the patient were reviewed by me and considered in my medical decision making (see chart for details).  Clinical Course as of 02/12/21 1521  Fri Apr 15, [id number]  5069 80 year old male presents after removing his PICC line which was placed yesterday in the emergency room.  Access is needed for  his IV antibiotics treating his scalp infection with underlying osteomyelitis. Is at baseline mental status, consistent with yesterday's ER presentation.  He reports he is otherwise well without complaints today. Request for PICC line placement.  Will reassess prior to discharge. [LM]  [place] with PICC line team has requested consult to IR as patient has removed his PICC line twice now, concerns for risks with further placement.  Discussed with Dr. Serafina Royals with IR who will see the patient.  Called to patient's ex-wife and a H[place], she is listed on patient's demographic sheet with the phone number listed for patient's daughter at [phone]. She states patient resides at [facility] currently, she was contacted today by nurse Jerrye Beavers and informed patient had pulled out his PICC line. She is also spoken with a Robyne Askew and a Toney Rakes who are caring for him as well. [place] [phone].  Patient's ex-wife is on her way to the hospital to be with him today. [LM]  1520 PICC line has been replaced, patient tolerated procedure well.  Discussed with patient's ex-wife and at bedside, she will follow-up with the facility to discuss possible use of Posey mitts to keep patient from removing his PICC line again. [LM]    Clinical Course User Index [LM] Roque Lias   MDM Rules/Calculators/A&P                          Final Clinical Impression(s) / ED Diagnoses Final diagnoses:  Status post PICC central line placement    Rx / DC Orders ED Discharge Orders    None       Tacy Learn, PA-C 02/12/21 1521    Fredia Sorrow, MD 02/12/21 1722

## 2021-02-12 NOTE — ED Triage Notes (Signed)
Ems brings pt in from nursing facility. Facility states pt pulled out picc line. No bleeding at this time. Pt has no complaints.

## 2021-02-12 NOTE — ED Notes (Signed)
ptar called for transport 

## 2021-02-13 DIAGNOSIS — R5381 Other malaise: Secondary | ICD-10-CM | POA: Diagnosis not present

## 2021-02-13 DIAGNOSIS — Z7401 Bed confinement status: Secondary | ICD-10-CM | POA: Diagnosis not present

## 2021-02-13 DIAGNOSIS — M2550 Pain in unspecified joint: Secondary | ICD-10-CM | POA: Diagnosis not present

## 2021-02-15 DIAGNOSIS — E785 Hyperlipidemia, unspecified: Secondary | ICD-10-CM | POA: Diagnosis not present

## 2021-02-15 DIAGNOSIS — U071 COVID-19: Secondary | ICD-10-CM | POA: Diagnosis not present

## 2021-02-15 DIAGNOSIS — N390 Urinary tract infection, site not specified: Secondary | ICD-10-CM | POA: Diagnosis not present

## 2021-02-15 DIAGNOSIS — Z139 Encounter for screening, unspecified: Secondary | ICD-10-CM | POA: Diagnosis not present

## 2021-02-15 DIAGNOSIS — R4182 Altered mental status, unspecified: Secondary | ICD-10-CM | POA: Diagnosis not present

## 2021-02-15 DIAGNOSIS — I10 Essential (primary) hypertension: Secondary | ICD-10-CM | POA: Diagnosis not present

## 2021-02-22 DIAGNOSIS — Z139 Encounter for screening, unspecified: Secondary | ICD-10-CM | POA: Diagnosis not present

## 2021-02-22 DIAGNOSIS — M869 Osteomyelitis, unspecified: Secondary | ICD-10-CM | POA: Diagnosis not present

## 2021-02-22 DIAGNOSIS — E785 Hyperlipidemia, unspecified: Secondary | ICD-10-CM | POA: Diagnosis not present

## 2021-02-22 DIAGNOSIS — S0100XD Unspecified open wound of scalp, subsequent encounter: Secondary | ICD-10-CM | POA: Diagnosis not present

## 2021-02-22 DIAGNOSIS — W19XXXA Unspecified fall, initial encounter: Secondary | ICD-10-CM | POA: Diagnosis not present

## 2021-02-22 DIAGNOSIS — F0390 Unspecified dementia without behavioral disturbance: Secondary | ICD-10-CM | POA: Diagnosis not present

## 2021-02-22 DIAGNOSIS — I10 Essential (primary) hypertension: Secondary | ICD-10-CM | POA: Diagnosis not present

## 2021-02-22 DIAGNOSIS — M6281 Muscle weakness (generalized): Secondary | ICD-10-CM | POA: Diagnosis not present

## 2021-02-22 DIAGNOSIS — R4182 Altered mental status, unspecified: Secondary | ICD-10-CM | POA: Diagnosis not present

## 2021-02-22 DIAGNOSIS — Z792 Long term (current) use of antibiotics: Secondary | ICD-10-CM | POA: Diagnosis not present

## 2021-02-22 DIAGNOSIS — N390 Urinary tract infection, site not specified: Secondary | ICD-10-CM | POA: Diagnosis not present

## 2021-02-22 DIAGNOSIS — U071 COVID-19: Secondary | ICD-10-CM | POA: Diagnosis not present

## 2021-02-23 DIAGNOSIS — R140 Abdominal distension (gaseous): Secondary | ICD-10-CM | POA: Diagnosis not present

## 2021-02-23 DIAGNOSIS — R109 Unspecified abdominal pain: Secondary | ICD-10-CM | POA: Diagnosis not present

## 2021-02-24 ENCOUNTER — Ambulatory Visit (INDEPENDENT_AMBULATORY_CARE_PROVIDER_SITE_OTHER): Payer: Medicare Other | Admitting: Internal Medicine

## 2021-02-24 ENCOUNTER — Encounter: Payer: Self-pay | Admitting: Internal Medicine

## 2021-02-24 ENCOUNTER — Other Ambulatory Visit: Payer: Self-pay

## 2021-02-24 DIAGNOSIS — S0100XA Unspecified open wound of scalp, initial encounter: Secondary | ICD-10-CM

## 2021-02-24 DIAGNOSIS — M8610 Other acute osteomyelitis, unspecified site: Secondary | ICD-10-CM

## 2021-02-24 DIAGNOSIS — Z139 Encounter for screening, unspecified: Secondary | ICD-10-CM | POA: Diagnosis not present

## 2021-02-24 DIAGNOSIS — R1084 Generalized abdominal pain: Secondary | ICD-10-CM | POA: Diagnosis not present

## 2021-02-24 DIAGNOSIS — N390 Urinary tract infection, site not specified: Secondary | ICD-10-CM | POA: Diagnosis not present

## 2021-02-24 DIAGNOSIS — R141 Gas pain: Secondary | ICD-10-CM | POA: Diagnosis not present

## 2021-02-24 DIAGNOSIS — L03811 Cellulitis of head [any part, except face]: Secondary | ICD-10-CM | POA: Diagnosis not present

## 2021-02-24 DIAGNOSIS — R4182 Altered mental status, unspecified: Secondary | ICD-10-CM | POA: Diagnosis not present

## 2021-02-24 DIAGNOSIS — Z452 Encounter for adjustment and management of vascular access device: Secondary | ICD-10-CM

## 2021-02-24 DIAGNOSIS — F0390 Unspecified dementia without behavioral disturbance: Secondary | ICD-10-CM | POA: Diagnosis not present

## 2021-02-24 DIAGNOSIS — E785 Hyperlipidemia, unspecified: Secondary | ICD-10-CM | POA: Diagnosis not present

## 2021-02-24 DIAGNOSIS — I10 Essential (primary) hypertension: Secondary | ICD-10-CM | POA: Diagnosis not present

## 2021-02-24 DIAGNOSIS — R140 Abdominal distension (gaseous): Secondary | ICD-10-CM | POA: Diagnosis not present

## 2021-02-24 DIAGNOSIS — U071 COVID-19: Secondary | ICD-10-CM | POA: Diagnosis not present

## 2021-02-24 NOTE — Assessment & Plan Note (Signed)
Skull osteomyelitis with wound and is healing well.  No concerns noted.  He will continue with his current treatment to complete 6 weeks then he can stop his antibiotics and have the picc line removed.   I have asked for a copy of the labs and to continue with lab monitoring.

## 2021-02-24 NOTE — Assessment & Plan Note (Signed)
At this time, no further abscess noted.  He will need referral to plastic surgery after treatment completion for consideration of a flap.  This was recommended to his current SNF.

## 2021-02-24 NOTE — Assessment & Plan Note (Signed)
No current issues today and will have this removed after his last dose.

## 2021-02-24 NOTE — Progress Notes (Signed)
   Subjective:    Patient ID: Tommy Bauer, male    DOB: 25-Mar-1941, 80 y.o.   MRN: [id number]  HPI Here for hsfu He has a scalp wound with abscess requiring drainage and bone exposure c/w skull osteomyelitis.  There was no positive culture result and he was started on empiric antibiotics with vancomycin and ceftriaxone for culture negative osteomyelitis.  He resides in a skilled nursing facility and is here today in clinic with his ex wife who has been his caregiver.  He has had issues inadvertently taking out his picc line but otherwise has been getting his antibiotics well.  He was changed to daptomycin at discharge for ease of dosing and no issues.  No labs have been sent to me by the SNF nor any available from his paperwork today. He continues to scratch his scalp area.  No associated rash or diarrhea.    Review of Systems  Constitutional: Negative for chills and fever.  Gastrointestinal: Negative for diarrhea.  Skin: Negative for rash.       Objective:   Physical Exam HENT:     Head:     Comments: Scalp area on right is dry, no drainage, no surrounding erythema Pulmonary:     Effort: Pulmonary effort is normal.  Musculoskeletal:     Right lower leg: No edema.     Left lower leg: No edema.  Skin:    Findings: No rash.  Neurological:     Mental Status: He is alert.    SH: SNF resident       Assessment & Plan:

## 2021-02-26 DIAGNOSIS — R143 Flatulence: Secondary | ICD-10-CM | POA: Diagnosis not present

## 2021-02-26 DIAGNOSIS — R1084 Generalized abdominal pain: Secondary | ICD-10-CM | POA: Diagnosis not present

## 2021-02-26 DIAGNOSIS — W19XXXS Unspecified fall, sequela: Secondary | ICD-10-CM | POA: Diagnosis not present

## 2021-02-26 DIAGNOSIS — F0390 Unspecified dementia without behavioral disturbance: Secondary | ICD-10-CM | POA: Diagnosis not present

## 2021-02-26 DIAGNOSIS — M160 Bilateral primary osteoarthritis of hip: Secondary | ICD-10-CM | POA: Diagnosis not present

## 2021-02-26 DIAGNOSIS — W19XXXA Unspecified fall, initial encounter: Secondary | ICD-10-CM | POA: Diagnosis not present

## 2021-02-26 DIAGNOSIS — R141 Gas pain: Secondary | ICD-10-CM | POA: Diagnosis not present

## 2021-02-26 DIAGNOSIS — M25551 Pain in right hip: Secondary | ICD-10-CM | POA: Diagnosis not present

## 2021-02-26 DIAGNOSIS — R140 Abdominal distension (gaseous): Secondary | ICD-10-CM | POA: Diagnosis not present

## 2021-03-01 DIAGNOSIS — Z139 Encounter for screening, unspecified: Secondary | ICD-10-CM | POA: Diagnosis not present

## 2021-03-01 DIAGNOSIS — E785 Hyperlipidemia, unspecified: Secondary | ICD-10-CM | POA: Diagnosis not present

## 2021-03-01 DIAGNOSIS — R4182 Altered mental status, unspecified: Secondary | ICD-10-CM | POA: Diagnosis not present

## 2021-03-01 DIAGNOSIS — I10 Essential (primary) hypertension: Secondary | ICD-10-CM | POA: Diagnosis not present

## 2021-03-01 DIAGNOSIS — U071 COVID-19: Secondary | ICD-10-CM | POA: Diagnosis not present

## 2021-03-01 DIAGNOSIS — N390 Urinary tract infection, site not specified: Secondary | ICD-10-CM | POA: Diagnosis not present

## 2021-03-02 DIAGNOSIS — R141 Gas pain: Secondary | ICD-10-CM | POA: Diagnosis not present

## 2021-03-02 DIAGNOSIS — R109 Unspecified abdominal pain: Secondary | ICD-10-CM | POA: Diagnosis not present

## 2021-03-02 DIAGNOSIS — R143 Flatulence: Secondary | ICD-10-CM | POA: Diagnosis not present

## 2021-03-02 DIAGNOSIS — R140 Abdominal distension (gaseous): Secondary | ICD-10-CM | POA: Diagnosis not present

## 2021-03-02 DIAGNOSIS — R1084 Generalized abdominal pain: Secondary | ICD-10-CM | POA: Diagnosis not present

## 2021-03-02 DIAGNOSIS — F0390 Unspecified dementia without behavioral disturbance: Secondary | ICD-10-CM | POA: Diagnosis not present

## 2021-03-02 DIAGNOSIS — Z792 Long term (current) use of antibiotics: Secondary | ICD-10-CM | POA: Diagnosis not present

## 2021-03-02 DIAGNOSIS — R110 Nausea: Secondary | ICD-10-CM | POA: Diagnosis not present

## 2021-03-02 DIAGNOSIS — M869 Osteomyelitis, unspecified: Secondary | ICD-10-CM | POA: Diagnosis not present

## 2021-03-02 DIAGNOSIS — K567 Ileus, unspecified: Secondary | ICD-10-CM | POA: Diagnosis not present

## 2021-03-08 DIAGNOSIS — U071 COVID-19: Secondary | ICD-10-CM | POA: Diagnosis not present

## 2021-03-08 DIAGNOSIS — Z139 Encounter for screening, unspecified: Secondary | ICD-10-CM | POA: Diagnosis not present

## 2021-03-08 DIAGNOSIS — R4182 Altered mental status, unspecified: Secondary | ICD-10-CM | POA: Diagnosis not present

## 2021-03-08 DIAGNOSIS — N390 Urinary tract infection, site not specified: Secondary | ICD-10-CM | POA: Diagnosis not present

## 2021-03-08 DIAGNOSIS — E785 Hyperlipidemia, unspecified: Secondary | ICD-10-CM | POA: Diagnosis not present

## 2021-03-08 DIAGNOSIS — I10 Essential (primary) hypertension: Secondary | ICD-10-CM | POA: Diagnosis not present

## 2021-03-13 DIAGNOSIS — M6281 Muscle weakness (generalized): Secondary | ICD-10-CM | POA: Diagnosis not present

## 2021-03-13 DIAGNOSIS — Z48817 Encounter for surgical aftercare following surgery on the skin and subcutaneous tissue: Secondary | ICD-10-CM | POA: Diagnosis not present

## 2021-03-13 DIAGNOSIS — N179 Acute kidney failure, unspecified: Secondary | ICD-10-CM | POA: Diagnosis not present

## 2021-03-15 DIAGNOSIS — M6281 Muscle weakness (generalized): Secondary | ICD-10-CM | POA: Diagnosis not present

## 2021-03-15 DIAGNOSIS — M940 Chondrocostal junction syndrome [Tietze]: Secondary | ICD-10-CM | POA: Diagnosis not present

## 2021-03-15 DIAGNOSIS — W19XXXS Unspecified fall, sequela: Secondary | ICD-10-CM | POA: Diagnosis not present

## 2021-03-15 DIAGNOSIS — L03811 Cellulitis of head [any part, except face]: Secondary | ICD-10-CM | POA: Diagnosis not present

## 2021-03-15 DIAGNOSIS — N390 Urinary tract infection, site not specified: Secondary | ICD-10-CM | POA: Diagnosis not present

## 2021-03-15 DIAGNOSIS — U071 COVID-19: Secondary | ICD-10-CM | POA: Diagnosis not present

## 2021-03-15 DIAGNOSIS — Z139 Encounter for screening, unspecified: Secondary | ICD-10-CM | POA: Diagnosis not present

## 2021-03-15 DIAGNOSIS — I10 Essential (primary) hypertension: Secondary | ICD-10-CM | POA: Diagnosis not present

## 2021-03-15 DIAGNOSIS — R0781 Pleurodynia: Secondary | ICD-10-CM | POA: Diagnosis not present

## 2021-03-15 DIAGNOSIS — E785 Hyperlipidemia, unspecified: Secondary | ICD-10-CM | POA: Diagnosis not present

## 2021-03-15 DIAGNOSIS — F0390 Unspecified dementia without behavioral disturbance: Secondary | ICD-10-CM | POA: Diagnosis not present

## 2021-03-15 DIAGNOSIS — R4182 Altered mental status, unspecified: Secondary | ICD-10-CM | POA: Diagnosis not present

## 2021-03-16 DIAGNOSIS — M6281 Muscle weakness (generalized): Secondary | ICD-10-CM | POA: Diagnosis not present

## 2021-03-16 DIAGNOSIS — L03811 Cellulitis of head [any part, except face]: Secondary | ICD-10-CM | POA: Diagnosis not present

## 2021-03-17 DIAGNOSIS — M6281 Muscle weakness (generalized): Secondary | ICD-10-CM | POA: Diagnosis not present

## 2021-03-17 DIAGNOSIS — L03811 Cellulitis of head [any part, except face]: Secondary | ICD-10-CM | POA: Diagnosis not present

## 2021-03-18 DIAGNOSIS — L853 Xerosis cutis: Secondary | ICD-10-CM | POA: Diagnosis not present

## 2021-03-18 DIAGNOSIS — L03811 Cellulitis of head [any part, except face]: Secondary | ICD-10-CM | POA: Diagnosis not present

## 2021-03-18 DIAGNOSIS — M6281 Muscle weakness (generalized): Secondary | ICD-10-CM | POA: Diagnosis not present

## 2021-03-18 DIAGNOSIS — L602 Onychogryphosis: Secondary | ICD-10-CM | POA: Diagnosis not present

## 2021-03-18 DIAGNOSIS — I739 Peripheral vascular disease, unspecified: Secondary | ICD-10-CM | POA: Diagnosis not present

## 2021-03-18 DIAGNOSIS — L84 Corns and callosities: Secondary | ICD-10-CM | POA: Diagnosis not present

## 2021-03-19 DIAGNOSIS — L03811 Cellulitis of head [any part, except face]: Secondary | ICD-10-CM | POA: Diagnosis not present

## 2021-03-19 DIAGNOSIS — M6281 Muscle weakness (generalized): Secondary | ICD-10-CM | POA: Diagnosis not present

## 2021-03-21 DIAGNOSIS — M6281 Muscle weakness (generalized): Secondary | ICD-10-CM | POA: Diagnosis not present

## 2021-03-21 DIAGNOSIS — L03811 Cellulitis of head [any part, except face]: Secondary | ICD-10-CM | POA: Diagnosis not present

## 2021-03-22 DIAGNOSIS — L03811 Cellulitis of head [any part, except face]: Secondary | ICD-10-CM | POA: Diagnosis not present

## 2021-03-22 DIAGNOSIS — M6281 Muscle weakness (generalized): Secondary | ICD-10-CM | POA: Diagnosis not present

## 2021-03-23 DIAGNOSIS — L03811 Cellulitis of head [any part, except face]: Secondary | ICD-10-CM | POA: Diagnosis not present

## 2021-03-23 DIAGNOSIS — M6281 Muscle weakness (generalized): Secondary | ICD-10-CM | POA: Diagnosis not present

## 2021-03-24 DIAGNOSIS — L03811 Cellulitis of head [any part, except face]: Secondary | ICD-10-CM | POA: Diagnosis not present

## 2021-03-24 DIAGNOSIS — M6281 Muscle weakness (generalized): Secondary | ICD-10-CM | POA: Diagnosis not present

## 2021-03-25 DIAGNOSIS — L03811 Cellulitis of head [any part, except face]: Secondary | ICD-10-CM | POA: Diagnosis not present

## 2021-03-25 DIAGNOSIS — M6281 Muscle weakness (generalized): Secondary | ICD-10-CM | POA: Diagnosis not present

## 2021-03-29 DIAGNOSIS — L03811 Cellulitis of head [any part, except face]: Secondary | ICD-10-CM | POA: Diagnosis not present

## 2021-03-29 DIAGNOSIS — M6281 Muscle weakness (generalized): Secondary | ICD-10-CM | POA: Diagnosis not present

## 2021-03-30 DIAGNOSIS — L03811 Cellulitis of head [any part, except face]: Secondary | ICD-10-CM | POA: Diagnosis not present

## 2021-03-30 DIAGNOSIS — M6281 Muscle weakness (generalized): Secondary | ICD-10-CM | POA: Diagnosis not present

## 2021-03-31 DIAGNOSIS — M6281 Muscle weakness (generalized): Secondary | ICD-10-CM | POA: Diagnosis not present

## 2021-03-31 DIAGNOSIS — L03811 Cellulitis of head [any part, except face]: Secondary | ICD-10-CM | POA: Diagnosis not present

## 2021-04-01 ENCOUNTER — Telehealth: Payer: Self-pay | Admitting: Adult Health

## 2021-04-01 DIAGNOSIS — L03811 Cellulitis of head [any part, except face]: Secondary | ICD-10-CM | POA: Diagnosis not present

## 2021-04-01 DIAGNOSIS — M6281 Muscle weakness (generalized): Secondary | ICD-10-CM | POA: Diagnosis not present

## 2021-04-01 NOTE — Telephone Encounter (Signed)
Spoke with patient spouse she stated patient is in nursing home on [address].

## 2021-04-02 DIAGNOSIS — L03811 Cellulitis of head [any part, except face]: Secondary | ICD-10-CM | POA: Diagnosis not present

## 2021-04-02 DIAGNOSIS — M6281 Muscle weakness (generalized): Secondary | ICD-10-CM | POA: Diagnosis not present

## 2021-04-05 DIAGNOSIS — M6281 Muscle weakness (generalized): Secondary | ICD-10-CM | POA: Diagnosis not present

## 2021-04-05 DIAGNOSIS — L03811 Cellulitis of head [any part, except face]: Secondary | ICD-10-CM | POA: Diagnosis not present

## 2021-04-06 DIAGNOSIS — M6281 Muscle weakness (generalized): Secondary | ICD-10-CM | POA: Diagnosis not present

## 2021-04-06 DIAGNOSIS — L03811 Cellulitis of head [any part, except face]: Secondary | ICD-10-CM | POA: Diagnosis not present

## 2021-04-07 DIAGNOSIS — L03811 Cellulitis of head [any part, except face]: Secondary | ICD-10-CM | POA: Diagnosis not present

## 2021-04-07 DIAGNOSIS — M6281 Muscle weakness (generalized): Secondary | ICD-10-CM | POA: Diagnosis not present

## 2021-04-08 DIAGNOSIS — L03811 Cellulitis of head [any part, except face]: Secondary | ICD-10-CM | POA: Diagnosis not present

## 2021-04-08 DIAGNOSIS — M6281 Muscle weakness (generalized): Secondary | ICD-10-CM | POA: Diagnosis not present

## 2021-04-10 DIAGNOSIS — L03811 Cellulitis of head [any part, except face]: Secondary | ICD-10-CM | POA: Diagnosis not present

## 2021-04-10 DIAGNOSIS — M6281 Muscle weakness (generalized): Secondary | ICD-10-CM | POA: Diagnosis not present

## 2021-04-11 DIAGNOSIS — M6281 Muscle weakness (generalized): Secondary | ICD-10-CM | POA: Diagnosis not present

## 2021-04-11 DIAGNOSIS — L03811 Cellulitis of head [any part, except face]: Secondary | ICD-10-CM | POA: Diagnosis not present

## 2021-04-13 DIAGNOSIS — Z8673 Personal history of transient ischemic attack (TIA), and cerebral infarction without residual deficits: Secondary | ICD-10-CM | POA: Diagnosis not present

## 2021-04-13 DIAGNOSIS — F1011 Alcohol abuse, in remission: Secondary | ICD-10-CM | POA: Diagnosis not present

## 2021-04-13 DIAGNOSIS — F0390 Unspecified dementia without behavioral disturbance: Secondary | ICD-10-CM | POA: Diagnosis not present

## 2021-04-13 DIAGNOSIS — I10 Essential (primary) hypertension: Secondary | ICD-10-CM | POA: Diagnosis not present

## 2021-04-13 DIAGNOSIS — R7303 Prediabetes: Secondary | ICD-10-CM | POA: Diagnosis not present

## 2021-04-13 DIAGNOSIS — M6281 Muscle weakness (generalized): Secondary | ICD-10-CM | POA: Diagnosis not present

## 2021-04-13 DIAGNOSIS — E7849 Other hyperlipidemia: Secondary | ICD-10-CM | POA: Diagnosis not present

## 2021-04-13 DIAGNOSIS — Z8546 Personal history of malignant neoplasm of prostate: Secondary | ICD-10-CM | POA: Diagnosis not present

## 2021-04-13 DIAGNOSIS — Z6826 Body mass index (BMI) 26.0-26.9, adult: Secondary | ICD-10-CM | POA: Diagnosis not present

## 2021-04-21 DIAGNOSIS — R630 Anorexia: Secondary | ICD-10-CM | POA: Diagnosis not present

## 2021-04-21 DIAGNOSIS — E43 Unspecified severe protein-calorie malnutrition: Secondary | ICD-10-CM | POA: Diagnosis not present

## 2021-05-10 DIAGNOSIS — I10 Essential (primary) hypertension: Secondary | ICD-10-CM | POA: Diagnosis not present

## 2021-05-10 DIAGNOSIS — S0100XA Unspecified open wound of scalp, initial encounter: Secondary | ICD-10-CM | POA: Diagnosis not present

## 2021-05-12 DIAGNOSIS — I10 Essential (primary) hypertension: Secondary | ICD-10-CM | POA: Diagnosis not present

## 2021-05-12 DIAGNOSIS — Z139 Encounter for screening, unspecified: Secondary | ICD-10-CM | POA: Diagnosis not present

## 2021-05-12 DIAGNOSIS — E785 Hyperlipidemia, unspecified: Secondary | ICD-10-CM | POA: Diagnosis not present

## 2021-05-12 DIAGNOSIS — R4182 Altered mental status, unspecified: Secondary | ICD-10-CM | POA: Diagnosis not present

## 2021-05-12 DIAGNOSIS — N390 Urinary tract infection, site not specified: Secondary | ICD-10-CM | POA: Diagnosis not present

## 2021-05-12 DIAGNOSIS — U071 COVID-19: Secondary | ICD-10-CM | POA: Diagnosis not present

## 2021-05-17 DIAGNOSIS — S0100XA Unspecified open wound of scalp, initial encounter: Secondary | ICD-10-CM | POA: Diagnosis not present

## 2021-05-17 DIAGNOSIS — I10 Essential (primary) hypertension: Secondary | ICD-10-CM | POA: Diagnosis not present

## 2021-05-24 DIAGNOSIS — L03811 Cellulitis of head [any part, except face]: Secondary | ICD-10-CM | POA: Diagnosis not present

## 2021-05-24 DIAGNOSIS — S0100XA Unspecified open wound of scalp, initial encounter: Secondary | ICD-10-CM | POA: Diagnosis not present

## 2021-06-01 DIAGNOSIS — I10 Essential (primary) hypertension: Secondary | ICD-10-CM | POA: Diagnosis not present

## 2021-06-01 DIAGNOSIS — R2689 Other abnormalities of gait and mobility: Secondary | ICD-10-CM | POA: Diagnosis not present

## 2021-06-02 ENCOUNTER — Ambulatory Visit: Payer: Medicare Other | Admitting: Neurology

## 2021-06-02 DIAGNOSIS — L03811 Cellulitis of head [any part, except face]: Secondary | ICD-10-CM | POA: Diagnosis not present

## 2021-06-02 DIAGNOSIS — S0100XA Unspecified open wound of scalp, initial encounter: Secondary | ICD-10-CM | POA: Diagnosis not present

## 2021-06-02 DIAGNOSIS — I10 Essential (primary) hypertension: Secondary | ICD-10-CM | POA: Diagnosis not present

## 2021-06-02 DIAGNOSIS — R2689 Other abnormalities of gait and mobility: Secondary | ICD-10-CM | POA: Diagnosis not present

## 2021-06-03 DIAGNOSIS — I10 Essential (primary) hypertension: Secondary | ICD-10-CM | POA: Diagnosis not present

## 2021-06-03 DIAGNOSIS — R2689 Other abnormalities of gait and mobility: Secondary | ICD-10-CM | POA: Diagnosis not present

## 2021-06-04 DIAGNOSIS — R2689 Other abnormalities of gait and mobility: Secondary | ICD-10-CM | POA: Diagnosis not present

## 2021-06-04 DIAGNOSIS — I10 Essential (primary) hypertension: Secondary | ICD-10-CM | POA: Diagnosis not present

## 2021-06-06 DIAGNOSIS — I10 Essential (primary) hypertension: Secondary | ICD-10-CM | POA: Diagnosis not present

## 2021-06-06 DIAGNOSIS — R2689 Other abnormalities of gait and mobility: Secondary | ICD-10-CM | POA: Diagnosis not present

## 2021-06-08 DIAGNOSIS — I10 Essential (primary) hypertension: Secondary | ICD-10-CM | POA: Diagnosis not present

## 2021-06-08 DIAGNOSIS — R2689 Other abnormalities of gait and mobility: Secondary | ICD-10-CM | POA: Diagnosis not present

## 2021-06-09 DIAGNOSIS — S0100XD Unspecified open wound of scalp, subsequent encounter: Secondary | ICD-10-CM | POA: Diagnosis not present

## 2021-06-09 DIAGNOSIS — I10 Essential (primary) hypertension: Secondary | ICD-10-CM | POA: Diagnosis not present

## 2021-06-09 DIAGNOSIS — E785 Hyperlipidemia, unspecified: Secondary | ICD-10-CM | POA: Diagnosis not present

## 2021-06-09 DIAGNOSIS — R2689 Other abnormalities of gait and mobility: Secondary | ICD-10-CM | POA: Diagnosis not present

## 2021-06-10 DIAGNOSIS — I10 Essential (primary) hypertension: Secondary | ICD-10-CM | POA: Diagnosis not present

## 2021-06-10 DIAGNOSIS — R2689 Other abnormalities of gait and mobility: Secondary | ICD-10-CM | POA: Diagnosis not present

## 2021-06-11 DIAGNOSIS — R2689 Other abnormalities of gait and mobility: Secondary | ICD-10-CM | POA: Diagnosis not present

## 2021-06-11 DIAGNOSIS — I10 Essential (primary) hypertension: Secondary | ICD-10-CM | POA: Diagnosis not present

## 2021-06-14 DIAGNOSIS — S0100XD Unspecified open wound of scalp, subsequent encounter: Secondary | ICD-10-CM | POA: Diagnosis not present

## 2021-06-14 DIAGNOSIS — E785 Hyperlipidemia, unspecified: Secondary | ICD-10-CM | POA: Diagnosis not present

## 2021-06-14 DIAGNOSIS — R2689 Other abnormalities of gait and mobility: Secondary | ICD-10-CM | POA: Diagnosis not present

## 2021-06-14 DIAGNOSIS — I10 Essential (primary) hypertension: Secondary | ICD-10-CM | POA: Diagnosis not present

## 2021-06-21 DIAGNOSIS — S0100XD Unspecified open wound of scalp, subsequent encounter: Secondary | ICD-10-CM | POA: Diagnosis not present

## 2021-06-21 DIAGNOSIS — E785 Hyperlipidemia, unspecified: Secondary | ICD-10-CM | POA: Diagnosis not present

## 2021-06-30 DIAGNOSIS — I10 Essential (primary) hypertension: Secondary | ICD-10-CM | POA: Diagnosis not present

## 2021-06-30 DIAGNOSIS — F1011 Alcohol abuse, in remission: Secondary | ICD-10-CM | POA: Diagnosis not present

## 2021-06-30 DIAGNOSIS — R7303 Prediabetes: Secondary | ICD-10-CM | POA: Diagnosis not present

## 2021-06-30 DIAGNOSIS — Z8546 Personal history of malignant neoplasm of prostate: Secondary | ICD-10-CM | POA: Diagnosis not present

## 2021-06-30 DIAGNOSIS — Z6826 Body mass index (BMI) 26.0-26.9, adult: Secondary | ICD-10-CM | POA: Diagnosis not present

## 2021-06-30 DIAGNOSIS — E7849 Other hyperlipidemia: Secondary | ICD-10-CM | POA: Diagnosis not present

## 2021-06-30 DIAGNOSIS — Z8673 Personal history of transient ischemic attack (TIA), and cerebral infarction without residual deficits: Secondary | ICD-10-CM | POA: Diagnosis not present

## 2021-06-30 DIAGNOSIS — M6281 Muscle weakness (generalized): Secondary | ICD-10-CM | POA: Diagnosis not present

## 2021-06-30 DIAGNOSIS — F0390 Unspecified dementia without behavioral disturbance: Secondary | ICD-10-CM | POA: Diagnosis not present

## 2021-07-02 DIAGNOSIS — U071 COVID-19: Secondary | ICD-10-CM | POA: Diagnosis not present

## 2021-07-02 DIAGNOSIS — N390 Urinary tract infection, site not specified: Secondary | ICD-10-CM | POA: Diagnosis not present

## 2021-07-02 DIAGNOSIS — Z139 Encounter for screening, unspecified: Secondary | ICD-10-CM | POA: Diagnosis not present

## 2021-07-02 DIAGNOSIS — I10 Essential (primary) hypertension: Secondary | ICD-10-CM | POA: Diagnosis not present

## 2021-07-02 DIAGNOSIS — R4182 Altered mental status, unspecified: Secondary | ICD-10-CM | POA: Diagnosis not present

## 2021-07-02 DIAGNOSIS — E785 Hyperlipidemia, unspecified: Secondary | ICD-10-CM | POA: Diagnosis not present

## 2021-07-07 DIAGNOSIS — S0100XD Unspecified open wound of scalp, subsequent encounter: Secondary | ICD-10-CM | POA: Diagnosis not present

## 2021-07-14 DIAGNOSIS — S0100XD Unspecified open wound of scalp, subsequent encounter: Secondary | ICD-10-CM | POA: Diagnosis not present

## 2021-07-19 DIAGNOSIS — S0100XD Unspecified open wound of scalp, subsequent encounter: Secondary | ICD-10-CM | POA: Diagnosis not present

## 2021-07-28 DIAGNOSIS — S0100XD Unspecified open wound of scalp, subsequent encounter: Secondary | ICD-10-CM | POA: Diagnosis not present

## 2021-08-03 DIAGNOSIS — Z8546 Personal history of malignant neoplasm of prostate: Secondary | ICD-10-CM | POA: Diagnosis not present

## 2021-08-06 DIAGNOSIS — E785 Hyperlipidemia, unspecified: Secondary | ICD-10-CM | POA: Diagnosis not present

## 2021-08-06 DIAGNOSIS — N390 Urinary tract infection, site not specified: Secondary | ICD-10-CM | POA: Diagnosis not present

## 2021-08-06 DIAGNOSIS — N19 Unspecified kidney failure: Secondary | ICD-10-CM | POA: Diagnosis not present

## 2021-08-06 DIAGNOSIS — C61 Malignant neoplasm of prostate: Secondary | ICD-10-CM | POA: Diagnosis not present

## 2021-08-06 DIAGNOSIS — R4182 Altered mental status, unspecified: Secondary | ICD-10-CM | POA: Diagnosis not present

## 2021-08-06 DIAGNOSIS — Z139 Encounter for screening, unspecified: Secondary | ICD-10-CM | POA: Diagnosis not present

## 2021-08-06 DIAGNOSIS — N189 Chronic kidney disease, unspecified: Secondary | ICD-10-CM | POA: Diagnosis not present

## 2021-08-06 DIAGNOSIS — U071 COVID-19: Secondary | ICD-10-CM | POA: Diagnosis not present

## 2021-08-06 DIAGNOSIS — I10 Essential (primary) hypertension: Secondary | ICD-10-CM | POA: Diagnosis not present

## 2021-08-09 DIAGNOSIS — S0100XD Unspecified open wound of scalp, subsequent encounter: Secondary | ICD-10-CM | POA: Diagnosis not present

## 2021-08-11 DIAGNOSIS — Z8546 Personal history of malignant neoplasm of prostate: Secondary | ICD-10-CM | POA: Diagnosis not present

## 2021-08-13 DIAGNOSIS — Z6828 Body mass index (BMI) 28.0-28.9, adult: Secondary | ICD-10-CM | POA: Diagnosis not present

## 2021-08-13 DIAGNOSIS — L853 Xerosis cutis: Secondary | ICD-10-CM | POA: Diagnosis not present

## 2021-08-13 DIAGNOSIS — H409 Unspecified glaucoma: Secondary | ICD-10-CM | POA: Diagnosis not present

## 2021-08-13 DIAGNOSIS — R140 Abdominal distension (gaseous): Secondary | ICD-10-CM | POA: Diagnosis not present

## 2021-08-13 DIAGNOSIS — E785 Hyperlipidemia, unspecified: Secondary | ICD-10-CM | POA: Diagnosis not present

## 2021-08-13 DIAGNOSIS — I10 Essential (primary) hypertension: Secondary | ICD-10-CM | POA: Diagnosis not present

## 2021-08-18 DIAGNOSIS — S0100XD Unspecified open wound of scalp, subsequent encounter: Secondary | ICD-10-CM | POA: Diagnosis not present

## 2021-08-24 DIAGNOSIS — M65332 Trigger finger, left middle finger: Secondary | ICD-10-CM | POA: Diagnosis not present

## 2021-08-24 DIAGNOSIS — M65322 Trigger finger, left index finger: Secondary | ICD-10-CM | POA: Diagnosis not present

## 2021-08-24 DIAGNOSIS — S0100XD Unspecified open wound of scalp, subsequent encounter: Secondary | ICD-10-CM | POA: Diagnosis not present

## 2021-08-24 DIAGNOSIS — F0390 Unspecified dementia without behavioral disturbance: Secondary | ICD-10-CM | POA: Diagnosis not present

## 2021-08-24 DIAGNOSIS — M25531 Pain in right wrist: Secondary | ICD-10-CM | POA: Diagnosis not present

## 2021-08-25 DIAGNOSIS — S0100XD Unspecified open wound of scalp, subsequent encounter: Secondary | ICD-10-CM | POA: Diagnosis not present

## 2021-09-02 DIAGNOSIS — S0100XD Unspecified open wound of scalp, subsequent encounter: Secondary | ICD-10-CM | POA: Diagnosis not present

## 2021-09-08 DIAGNOSIS — M8618 Other acute osteomyelitis, other site: Secondary | ICD-10-CM | POA: Diagnosis not present

## 2021-09-08 DIAGNOSIS — M6281 Muscle weakness (generalized): Secondary | ICD-10-CM | POA: Diagnosis not present

## 2021-09-08 DIAGNOSIS — S0100XD Unspecified open wound of scalp, subsequent encounter: Secondary | ICD-10-CM | POA: Diagnosis not present

## 2021-09-09 DIAGNOSIS — M8618 Other acute osteomyelitis, other site: Secondary | ICD-10-CM | POA: Diagnosis not present

## 2021-09-09 DIAGNOSIS — M6281 Muscle weakness (generalized): Secondary | ICD-10-CM | POA: Diagnosis not present

## 2021-09-10 DIAGNOSIS — M6281 Muscle weakness (generalized): Secondary | ICD-10-CM | POA: Diagnosis not present

## 2021-09-10 DIAGNOSIS — M8618 Other acute osteomyelitis, other site: Secondary | ICD-10-CM | POA: Diagnosis not present

## 2021-09-13 DIAGNOSIS — M6281 Muscle weakness (generalized): Secondary | ICD-10-CM | POA: Diagnosis not present

## 2021-09-13 DIAGNOSIS — M8618 Other acute osteomyelitis, other site: Secondary | ICD-10-CM | POA: Diagnosis not present

## 2021-09-14 DIAGNOSIS — M8618 Other acute osteomyelitis, other site: Secondary | ICD-10-CM | POA: Diagnosis not present

## 2021-09-14 DIAGNOSIS — M6281 Muscle weakness (generalized): Secondary | ICD-10-CM | POA: Diagnosis not present

## 2021-09-14 DIAGNOSIS — R2231 Localized swelling, mass and lump, right upper limb: Secondary | ICD-10-CM | POA: Diagnosis not present

## 2021-09-14 DIAGNOSIS — R600 Localized edema: Secondary | ICD-10-CM | POA: Diagnosis not present

## 2021-09-14 DIAGNOSIS — M25531 Pain in right wrist: Secondary | ICD-10-CM | POA: Diagnosis not present

## 2021-09-15 DIAGNOSIS — M6281 Muscle weakness (generalized): Secondary | ICD-10-CM | POA: Diagnosis not present

## 2021-09-15 DIAGNOSIS — M8618 Other acute osteomyelitis, other site: Secondary | ICD-10-CM | POA: Diagnosis not present

## 2021-09-16 DIAGNOSIS — M25531 Pain in right wrist: Secondary | ICD-10-CM | POA: Diagnosis not present

## 2021-09-16 DIAGNOSIS — M10031 Idiopathic gout, right wrist: Secondary | ICD-10-CM | POA: Diagnosis not present

## 2021-09-16 DIAGNOSIS — Z7689 Persons encountering health services in other specified circumstances: Secondary | ICD-10-CM | POA: Diagnosis not present

## 2021-09-16 DIAGNOSIS — L98499 Non-pressure chronic ulcer of skin of other sites with unspecified severity: Secondary | ICD-10-CM | POA: Diagnosis not present

## 2021-09-16 DIAGNOSIS — M6281 Muscle weakness (generalized): Secondary | ICD-10-CM | POA: Diagnosis not present

## 2021-09-16 DIAGNOSIS — M8618 Other acute osteomyelitis, other site: Secondary | ICD-10-CM | POA: Diagnosis not present

## 2021-09-16 DIAGNOSIS — L853 Xerosis cutis: Secondary | ICD-10-CM | POA: Diagnosis not present

## 2021-09-17 DIAGNOSIS — M6281 Muscle weakness (generalized): Secondary | ICD-10-CM | POA: Diagnosis not present

## 2021-09-17 DIAGNOSIS — M8618 Other acute osteomyelitis, other site: Secondary | ICD-10-CM | POA: Diagnosis not present

## 2021-09-20 ENCOUNTER — Ambulatory Visit (INDEPENDENT_AMBULATORY_CARE_PROVIDER_SITE_OTHER): Payer: Medicare Other | Admitting: Neurology

## 2021-09-20 ENCOUNTER — Encounter: Payer: Self-pay | Admitting: Neurology

## 2021-09-20 VITALS — BP 185/94 | HR 88 | Wt 220.0 lb

## 2021-09-20 DIAGNOSIS — M8618 Other acute osteomyelitis, other site: Secondary | ICD-10-CM | POA: Diagnosis not present

## 2021-09-20 DIAGNOSIS — I679 Cerebrovascular disease, unspecified: Secondary | ICD-10-CM | POA: Diagnosis not present

## 2021-09-20 DIAGNOSIS — R413 Other amnesia: Secondary | ICD-10-CM | POA: Diagnosis not present

## 2021-09-20 DIAGNOSIS — M6281 Muscle weakness (generalized): Secondary | ICD-10-CM | POA: Diagnosis not present

## 2021-09-20 DIAGNOSIS — R269 Unspecified abnormalities of gait and mobility: Secondary | ICD-10-CM

## 2021-09-20 NOTE — Progress Notes (Signed)
Chief Complaint  Patient presents with   Follow-up    Rm 14. Accompanied by ex wife. Pt denies any concerns. Pt's ex wife states memory seems to have improved.    ASSESSMENT AND PLAN  Tommy Bauer is a 80 y.o. male   Dementia Cerebrovascular disease  Not cooperative on Mini-Mental Status Examination today, not oriented to year, or his age,  Most likely Central nervous system degenerative disorder, with a vascular component, worsening confusion, encephalopathy during hospital stay due to active infection  MRI of the brain November 2021 and CT head with without contrast in April 2022, showed brain atrophy, supratentorium small vessel disease, Right scalp abrasion scar  Was treated for cellulitis by prolonged antibiotics,   DIAGNOSTIC DATA (LABS, IMAGING, TESTING) - I reviewed patient records, labs, notes, testing and imaging myself where available.  HISTORICAL  Tommy Bauer is a 80 year old male, seen in request by her primary care nurse practitioner [name] on September 17, 2020, following his hospital discharge  I reviewed and summarized the referring note.  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, he still drives, but lost his car few years ago, was not able to afford to a new car, used to drink heavily, to the point of drunk, quit drinking around 2020,  He was admitted to hospital from November 14-18, 2021 after his ex-wife found him on the floor, hypothermia, encephalopathic, leukocytosis, left eye conjunctivitis, mild rhabdomyolysis, acute metabolic encephalopathy, likely due to dehydration, active infection, urinalysis showed evidence of protein, chest x-ray showed stable elevation of left hemidiaphragm with left basilar atelectasis, he was treated with broad-spectrum antibiotics, he is symptoms gradually improved, culture was  negative, urine culture showed less than 10,000 colonies.  EEG showed intermittent slowing, left more than right temporal region, no epileptiform discharge  Echocardiogram ejection fraction 55 to 60%, no acute abnormality. I personally reviewed MRI of the brain without contrast in November 2021: Generalized atrophy, moderate small vessel disease Laboratory values in 2021, CPK was 254 on September 17, 2020, was 1836 on November 14, CMP showed mild elevated creatinine 1.3, decreased calcium 8.1, CBC elevated WBC of 20.3 elevated troponin of 45, alcohol less than 10, normal TSH  UPDATE Sep 20 2021: He has a bad fall with right scalp abrasion, required prolonged hospital admission, now at [facility] [place],   I reviewed hospital discharge in November 2021, he was found by his ex-wife on the floor, he was hypothermia, sepsis, was treated with broad antibiotics, also has encephalopathic,  Second hospital admission in April 2022, right-sided scalp wound, with fluid collection, incision and drainage, treated with IV vancomycin, Zofran, was seen by cardiology for bradycardia, second-degree AV block, was treated with extensive antibiotic daptomycin 750 mg IV, ceftriaxone 2 g daily for 6 weeks, finished on Mar 15, 2021  Evaluations in April 2022, CBC showed WBC of 6.8, hemoglobin of 12.3, RDW was 16.4, normal BMP, creatinine of 0.9, elevated sed rate of 35, C-reactive protein of 0.6, B12 402, negative HIV, RPR, CPK of 109  Personally reviewed CT head without contrast on February 01, 2021, persistent diffuse soft tissue thickening, irregularity involving the right frontal parietal lobe scalp, concerning for infection/cellulitis, 3 to 4 cm fluid connection within this region, suspicious for abscess, irregularity of the underlying outer table of the calvarium, suspicious for associated osteomyelitis, no evidence of intracranial involvement, stable moderate small vessel disease,  moderate right mastoid  effusion,  PHYSICAL EXAM   Vitals:   09/20/21 1023  BP: (!) 185/94  Pulse: 88  Weight: 220 lb (99.8 kg)   Not recorded     Body mass index is 29.84 kg/m.  PHYSICAL EXAMNIATION:  Gen: NAD, conversant, well nourised, well groomed                     Cardiovascular: Regular rate rhythm, no peripheral edema, warm, nontender. Eyes: Conjunctivae clear without exudates or hemorrhage Neck: Supple, no carotid bruits. Pulmonary: Clear to auscultation bilaterally   NEUROLOGICAL EXAM:  MMSE - Mini Mental State Exam 12/14/2020  Orientation to time 1  Orientation to Place 2  Registration 3  Attention/ Calculation 0  Recall 0  Language- name 2 objects 2  Language- repeat 1  Language- follow 3 step command 3  Language- read & follow direction 0  Write a sentence 0  Copy design 0  Total score 12      CRANIAL NERVES: CN II: Visual fields are full to confrontation. Pupils are round equal and briskly reactive to light. CN III, IV, VI: extraocular movement are normal. No ptosis. CN V: Facial sensation is intact to light touch CN VII: Face is symmetric with normal eye closure  CN VIII: Hearing is normal to causal conversation. CN IX, X: Phonation is normal. CN XI: Head turning and shoulder shrug are intact  MOTOR: There is no pronator drift of out-stretched arms. Muscle bulk and tone are normal. Muscle strength is normal.  REFLEXES: Reflexes are 2+ and symmetric at the biceps, triceps, knees, and ankles. Plantar responses are flexor.  SENSORY: Intact to light touch, pinprick and vibratory sensation are intact in fingers and toes.  COORDINATION: There is no trunk or limb dysmetria noted.  GAIT/STANCE: Need push-up to get up from seated position, antalgic   REVIEW OF SYSTEMS: Full 14 system review of systems performed and notable only for as above All other review of systems were negative.  ALLERGIES: No Known Allergies  HOME MEDICATIONS: Current Outpatient  Medications  Medication Sig Dispense Refill   acetaminophen (TYLENOL) 500 MG tablet Take 500 mg by mouth every 4 (four) hours as needed for moderate pain.      ammonium lactate (AMLACTIN) 12 % cream Apply topically at bedtime. Apply to lower legs and feet     aspirin 81 MG tablet Take 1 tablet (81 mg total) by mouth daily. 30 tablet    atorvastatin (LIPITOR) 40 MG tablet Take 1 tablet (40 mg total) by mouth daily. 90 tablet 1   bimatoprost (LUMIGAN) 0.03 % ophthalmic solution Place 1 drop into both eyes at bedtime.     Diclofenac Sodium 3 % GEL Apply 1 application topically 4 (four) times daily. To bilateral knees     folic acid (FOLVITE) 1 MG tablet Take 1 mg by mouth daily.     furosemide (LASIX) 40 MG tablet Take 40 mg by mouth daily.     hydrALAZINE (APRESOLINE) 25 MG tablet Take 1 tablet (25 mg total) by mouth every 8 (eight) hours. 90 tablet 0   Infant Care Products (DERMACLOUD EX) Apply 1 application topically daily. Apply to scrotum and sacrum     Multiple Vitamin (MULTIVITAMIN) tablet Take 1 tablet by mouth daily.     potassium chloride (KLOR-CON) 20 MEQ packet Take 40 mEq by mouth daily. When taking lasix     Simethicone 125 MG CAPS Take 1 capsule by mouth daily.  Skin Protectants, Misc. (EUCERIN) cream Apply 1 application topically daily. Apply to extremities,trunk and back after bath     thiamine 100 MG tablet Take 100 mg by mouth daily.     No current facility-administered medications for this visit.    PAST MEDICAL HISTORY: Past Medical History:  Diagnosis Date   ADENOCARCINOMA, PROSTATE 05/19/2010   BACK PAIN 07/16/2008   Carpal tunnel syndrome 10/15/2008   COLONIC POLYPS, HX OF 06/08/2007   Contusion    left shoulder,left knee,s/p recent fall end of march 2013   CVA (cerebral vascular accident) ([place])    ERECTILE DYSFUNCTION, ORGANIC 07/11/2007   Headache(784.0) 07/11/2007   HYPERTENSION 06/08/2007   INGUINAL HERNIA 12/04/2007   LEFT VENTRICULAR FUNCTION, DECREASED  11/12/2007   Prostate cancer ([place]) 09/14/2009   prostateectomy,adenocarcinoma,gleason=4+3=7   PROSTATE SPECIFIC ANTIGEN, ELEVATED 05/06/2009   TOBACCO ABUSE 11/06/2009    PAST SURGICAL HISTORY: Past Surgical History:  Procedure Laterality Date   HERNIA REPAIR     right ingunial   PROSTATE SURGERY  09/14/2009   radical prostatectomy /gleason=4+3-7    FAMILY HISTORY: Family History  Problem Relation Age of Onset   Colon cancer Neg Hx     SOCIAL HISTORY: Social History   Socioeconomic History   Marital status: Married    Spouse name: Catalina Lunger   Number of children: Not on file   Years of education: Not on file   Highest education level: Not on file  Occupational History    Employer: RETIRED    Comment: retired Games developer  Tobacco Use   Smoking status: Former    Years: 60.00    Types: Cigarettes   Smokeless tobacco: Never  Substance and Sexual Activity   Alcohol use: Not Currently   Drug use: No   Sexual activity: Not on file  Other Topics Concern   Not on file  Social History Narrative   Retired Games developer    Married for 54 years but legally separated.    Lives at [facility]   Right Handed   Cannot read   Drinks no caffeine   Social Determinants of Health   Financial Resource Strain: Not on file  Food Insecurity: Not on file  Transportation Needs: Not on file  Physical Activity: Not on file  Stress: Not on file  Social Connections: Not on file  Intimate Partner Violence: Not on file    Total time spent reviewing the chart, obtaining history, examined patient, ordering tests, documentation, consultations and family, care coordination was 29 minutes   Marcial Pacas, M.D. Ph.D.  [facility] [address] [place], [place] [address] Ph: [phone] Fax: [phone]  CC:  Elodia Florence., MD [address] [place],  [place] [address]

## 2021-09-21 DIAGNOSIS — M6281 Muscle weakness (generalized): Secondary | ICD-10-CM | POA: Diagnosis not present

## 2021-09-21 DIAGNOSIS — F03B4 Unspecified dementia, moderate, with anxiety: Secondary | ICD-10-CM | POA: Diagnosis not present

## 2021-09-21 DIAGNOSIS — M8618 Other acute osteomyelitis, other site: Secondary | ICD-10-CM | POA: Diagnosis not present

## 2021-09-22 DIAGNOSIS — L98499 Non-pressure chronic ulcer of skin of other sites with unspecified severity: Secondary | ICD-10-CM | POA: Diagnosis not present

## 2021-09-27 DIAGNOSIS — N189 Chronic kidney disease, unspecified: Secondary | ICD-10-CM | POA: Diagnosis not present

## 2021-09-27 DIAGNOSIS — F03B4 Unspecified dementia, moderate, with anxiety: Secondary | ICD-10-CM | POA: Diagnosis not present

## 2021-09-27 DIAGNOSIS — E785 Hyperlipidemia, unspecified: Secondary | ICD-10-CM | POA: Diagnosis not present

## 2021-09-27 DIAGNOSIS — C61 Malignant neoplasm of prostate: Secondary | ICD-10-CM | POA: Diagnosis not present

## 2021-09-27 DIAGNOSIS — I10 Essential (primary) hypertension: Secondary | ICD-10-CM | POA: Diagnosis not present

## 2021-09-27 DIAGNOSIS — Z139 Encounter for screening, unspecified: Secondary | ICD-10-CM | POA: Diagnosis not present

## 2021-09-27 DIAGNOSIS — N19 Unspecified kidney failure: Secondary | ICD-10-CM | POA: Diagnosis not present

## 2021-09-27 DIAGNOSIS — R4182 Altered mental status, unspecified: Secondary | ICD-10-CM | POA: Diagnosis not present

## 2021-09-27 DIAGNOSIS — U071 COVID-19: Secondary | ICD-10-CM | POA: Diagnosis not present

## 2021-09-27 DIAGNOSIS — N390 Urinary tract infection, site not specified: Secondary | ICD-10-CM | POA: Diagnosis not present

## 2021-09-29 DIAGNOSIS — R600 Localized edema: Secondary | ICD-10-CM | POA: Diagnosis not present

## 2021-09-29 DIAGNOSIS — S0100XD Unspecified open wound of scalp, subsequent encounter: Secondary | ICD-10-CM | POA: Diagnosis not present

## 2021-09-29 DIAGNOSIS — M25531 Pain in right wrist: Secondary | ICD-10-CM | POA: Diagnosis not present

## 2021-09-29 DIAGNOSIS — M65322 Trigger finger, left index finger: Secondary | ICD-10-CM | POA: Diagnosis not present

## 2021-10-04 DIAGNOSIS — L98499 Non-pressure chronic ulcer of skin of other sites with unspecified severity: Secondary | ICD-10-CM | POA: Diagnosis not present

## 2021-10-04 DIAGNOSIS — F0150 Vascular dementia without behavioral disturbance: Secondary | ICD-10-CM | POA: Diagnosis not present

## 2021-10-04 DIAGNOSIS — F0283 Dementia in other diseases classified elsewhere, unspecified severity, with mood disturbance: Secondary | ICD-10-CM | POA: Diagnosis not present

## 2021-10-11 DIAGNOSIS — F0283 Dementia in other diseases classified elsewhere, unspecified severity, with mood disturbance: Secondary | ICD-10-CM | POA: Diagnosis not present

## 2021-10-11 DIAGNOSIS — F0150 Vascular dementia without behavioral disturbance: Secondary | ICD-10-CM | POA: Diagnosis not present

## 2021-10-14 DIAGNOSIS — L98499 Non-pressure chronic ulcer of skin of other sites with unspecified severity: Secondary | ICD-10-CM | POA: Diagnosis not present

## 2021-10-20 DIAGNOSIS — L98499 Non-pressure chronic ulcer of skin of other sites with unspecified severity: Secondary | ICD-10-CM | POA: Diagnosis not present

## 2021-10-27 DIAGNOSIS — L98499 Non-pressure chronic ulcer of skin of other sites with unspecified severity: Secondary | ICD-10-CM | POA: Diagnosis not present

## 2022-06-18 ENCOUNTER — Emergency Department (HOSPITAL_COMMUNITY): Payer: Medicare Other

## 2022-06-18 ENCOUNTER — Encounter (HOSPITAL_COMMUNITY): Payer: Self-pay

## 2022-06-18 ENCOUNTER — Inpatient Hospital Stay (HOSPITAL_COMMUNITY)
Admission: EM | Admit: 2022-06-18 | Discharge: 2022-06-28 | DRG: 193 | Disposition: A | Discharge status: Skilled Nursing Facility | Payer: Medicare Other | Source: Skilled Nursing Facility | Attending: Internal Medicine | Admitting: Internal Medicine

## 2022-06-18 ENCOUNTER — Other Ambulatory Visit: Payer: Self-pay

## 2022-06-18 DIAGNOSIS — Z8673 Personal history of transient ischemic attack (TIA), and cerebral infarction without residual deficits: Secondary | ICD-10-CM

## 2022-06-18 DIAGNOSIS — I517 Cardiomegaly: Secondary | ICD-10-CM | POA: Diagnosis not present

## 2022-06-18 DIAGNOSIS — N179 Acute kidney failure, unspecified: Secondary | ICD-10-CM | POA: Diagnosis not present

## 2022-06-18 DIAGNOSIS — J441 Chronic obstructive pulmonary disease with (acute) exacerbation: Principal | ICD-10-CM

## 2022-06-18 DIAGNOSIS — I441 Atrioventricular block, second degree: Secondary | ICD-10-CM | POA: Diagnosis present

## 2022-06-18 DIAGNOSIS — F0390 Unspecified dementia without behavioral disturbance: Secondary | ICD-10-CM | POA: Diagnosis present

## 2022-06-18 DIAGNOSIS — I2510 Atherosclerotic heart disease of native coronary artery without angina pectoris: Secondary | ICD-10-CM | POA: Diagnosis present

## 2022-06-18 DIAGNOSIS — R778 Other specified abnormalities of plasma proteins: Secondary | ICD-10-CM | POA: Diagnosis present

## 2022-06-18 DIAGNOSIS — I10 Essential (primary) hypertension: Secondary | ICD-10-CM | POA: Diagnosis not present

## 2022-06-18 DIAGNOSIS — Z7982 Long term (current) use of aspirin: Secondary | ICD-10-CM | POA: Diagnosis not present

## 2022-06-18 DIAGNOSIS — E782 Mixed hyperlipidemia: Secondary | ICD-10-CM

## 2022-06-18 DIAGNOSIS — I5043 Acute on chronic combined systolic (congestive) and diastolic (congestive) heart failure: Secondary | ICD-10-CM | POA: Diagnosis present

## 2022-06-18 DIAGNOSIS — R9431 Abnormal electrocardiogram [ECG] [EKG]: Secondary | ICD-10-CM | POA: Diagnosis not present

## 2022-06-18 DIAGNOSIS — Z79899 Other long term (current) drug therapy: Secondary | ICD-10-CM | POA: Diagnosis not present

## 2022-06-18 DIAGNOSIS — I130 Hypertensive heart and chronic kidney disease with heart failure and stage 1 through stage 4 chronic kidney disease, or unspecified chronic kidney disease: Secondary | ICD-10-CM | POA: Diagnosis present

## 2022-06-18 DIAGNOSIS — Z87891 Personal history of nicotine dependence: Secondary | ICD-10-CM

## 2022-06-18 DIAGNOSIS — R590 Localized enlarged lymph nodes: Secondary | ICD-10-CM | POA: Diagnosis present

## 2022-06-18 DIAGNOSIS — R413 Other amnesia: Secondary | ICD-10-CM | POA: Diagnosis present

## 2022-06-18 DIAGNOSIS — I2721 Secondary pulmonary arterial hypertension: Secondary | ICD-10-CM | POA: Diagnosis present

## 2022-06-18 DIAGNOSIS — Z8546 Personal history of malignant neoplasm of prostate: Secondary | ICD-10-CM

## 2022-06-18 DIAGNOSIS — Z7984 Long term (current) use of oral hypoglycemic drugs: Secondary | ICD-10-CM | POA: Diagnosis not present

## 2022-06-18 DIAGNOSIS — E785 Hyperlipidemia, unspecified: Secondary | ICD-10-CM | POA: Diagnosis present

## 2022-06-18 DIAGNOSIS — I5030 Unspecified diastolic (congestive) heart failure: Secondary | ICD-10-CM | POA: Diagnosis not present

## 2022-06-18 DIAGNOSIS — Z9079 Acquired absence of other genital organ(s): Secondary | ICD-10-CM

## 2022-06-18 DIAGNOSIS — J9601 Acute respiratory failure with hypoxia: Secondary | ICD-10-CM | POA: Diagnosis present

## 2022-06-18 DIAGNOSIS — N1831 Chronic kidney disease, stage 3a: Secondary | ICD-10-CM | POA: Diagnosis present

## 2022-06-18 DIAGNOSIS — Z20822 Contact with and (suspected) exposure to covid-19: Secondary | ICD-10-CM | POA: Diagnosis present

## 2022-06-18 DIAGNOSIS — R7989 Other specified abnormal findings of blood chemistry: Secondary | ICD-10-CM | POA: Diagnosis present

## 2022-06-18 DIAGNOSIS — A419 Sepsis, unspecified organism: Secondary | ICD-10-CM | POA: Diagnosis not present

## 2022-06-18 DIAGNOSIS — J9602 Acute respiratory failure with hypercapnia: Secondary | ICD-10-CM | POA: Diagnosis present

## 2022-06-18 DIAGNOSIS — J189 Pneumonia, unspecified organism: Principal | ICD-10-CM | POA: Diagnosis present

## 2022-06-18 DIAGNOSIS — I5032 Chronic diastolic (congestive) heart failure: Secondary | ICD-10-CM | POA: Diagnosis not present

## 2022-06-18 DIAGNOSIS — I5020 Unspecified systolic (congestive) heart failure: Secondary | ICD-10-CM | POA: Diagnosis not present

## 2022-06-18 DIAGNOSIS — Z923 Personal history of irradiation: Secondary | ICD-10-CM

## 2022-06-18 DIAGNOSIS — R519 Headache, unspecified: Secondary | ICD-10-CM | POA: Diagnosis present

## 2022-06-18 DIAGNOSIS — I5033 Acute on chronic diastolic (congestive) heart failure: Secondary | ICD-10-CM | POA: Diagnosis not present

## 2022-06-18 DIAGNOSIS — E8809 Other disorders of plasma-protein metabolism, not elsewhere classified: Secondary | ICD-10-CM | POA: Diagnosis present

## 2022-06-18 DIAGNOSIS — I5023 Acute on chronic systolic (congestive) heart failure: Secondary | ICD-10-CM | POA: Diagnosis not present

## 2022-06-18 LAB — I-STAT VENOUS BLOOD GAS, ED
Acid-Base Excess: 6 mmol/L — ABNORMAL HIGH (ref 0.0–2.0)
Bicarbonate: 34.5 mmol/L — ABNORMAL HIGH (ref 20.0–28.0)
Calcium, Ion: 1.08 mmol/L — ABNORMAL LOW (ref 1.15–1.40)
HCT: 52 % (ref 39.0–52.0)
Hemoglobin: 17.7 g/dL — ABNORMAL HIGH (ref 13.0–17.0)
O2 Saturation: 82 %
Potassium: 4.7 mmol/L (ref 3.5–5.1)
Sodium: 141 mmol/L (ref 135–145)
TCO2: 36 mmol/L — ABNORMAL HIGH (ref 22–32)
pCO2, Ven: 63.5 mmHg — ABNORMAL HIGH (ref 44–60)
pH, Ven: 7.343 (ref 7.25–7.43)
pO2, Ven: 51 mmHg — ABNORMAL HIGH (ref 32–45)

## 2022-06-18 LAB — I-STAT ARTERIAL BLOOD GAS, ED
Acid-Base Excess: 2 mmol/L (ref 0.0–2.0)
Bicarbonate: 28.7 mmol/L — ABNORMAL HIGH (ref 20.0–28.0)
Calcium, Ion: 1.16 mmol/L (ref 1.15–1.40)
HCT: 49 % (ref 39.0–52.0)
Hemoglobin: 16.7 g/dL (ref 13.0–17.0)
O2 Saturation: 92 %
Patient temperature: 98.6
Potassium: 4 mmol/L (ref 3.5–5.1)
Sodium: 141 mmol/L (ref 135–145)
TCO2: 30 mmol/L (ref 22–32)
pCO2 arterial: 52.1 mmHg — ABNORMAL HIGH (ref 32–48)
pH, Arterial: 7.349 — ABNORMAL LOW (ref 7.35–7.45)
pO2, Arterial: 70 mmHg — ABNORMAL LOW (ref 83–108)

## 2022-06-18 LAB — URINALYSIS, ROUTINE W REFLEX MICROSCOPIC
Bilirubin Urine: NEGATIVE
Glucose, UA: NEGATIVE mg/dL
Ketones, ur: NEGATIVE mg/dL
Leukocytes,Ua: NEGATIVE
Nitrite: NEGATIVE
Protein, ur: NEGATIVE mg/dL
Specific Gravity, Urine: 1.01 (ref 1.005–1.030)
pH: 5.5 (ref 5.0–8.0)

## 2022-06-18 LAB — CBC WITH DIFFERENTIAL/PLATELET
Abs Immature Granulocytes: 0.11 10*3/uL — ABNORMAL HIGH (ref 0.00–0.07)
Basophils Absolute: 0 10*3/uL (ref 0.0–0.1)
Basophils Relative: 0 %
Eosinophils Absolute: 0 10*3/uL (ref 0.0–0.5)
Eosinophils Relative: 0 %
HCT: 49.7 % (ref 39.0–52.0)
Hemoglobin: 14.9 g/dL (ref 13.0–17.0)
Immature Granulocytes: 1 %
Lymphocytes Relative: 3 %
Lymphs Abs: 0.3 10*3/uL — ABNORMAL LOW (ref 0.7–4.0)
MCH: 27.4 pg (ref 26.0–34.0)
MCHC: 30 g/dL (ref 30.0–36.0)
MCV: 91.4 fL (ref 80.0–100.0)
Monocytes Absolute: 0.5 10*3/uL (ref 0.1–1.0)
Monocytes Relative: 4 %
Neutro Abs: 12 10*3/uL — ABNORMAL HIGH (ref 1.7–7.7)
Neutrophils Relative %: 92 %
Platelets: 182 10*3/uL (ref 150–400)
RBC: 5.44 MIL/uL (ref 4.22–5.81)
RDW: 17.4 % — ABNORMAL HIGH (ref 11.5–15.5)
WBC: 12.9 10*3/uL — ABNORMAL HIGH (ref 4.0–10.5)
nRBC: 0 % (ref 0.0–0.2)

## 2022-06-18 LAB — PROCALCITONIN: Procalcitonin: 6.86 ng/mL

## 2022-06-18 LAB — URINALYSIS, MICROSCOPIC (REFLEX)

## 2022-06-18 LAB — BASIC METABOLIC PANEL
Anion gap: 13 (ref 5–15)
BUN: 13 mg/dL (ref 8–23)
CO2: 26 mmol/L (ref 22–32)
Calcium: 8.9 mg/dL (ref 8.9–10.3)
Chloride: 101 mmol/L (ref 98–111)
Creatinine, Ser: 1.24 mg/dL (ref 0.61–1.24)
GFR, Estimated: 58 mL/min — ABNORMAL LOW (ref >60)
Glucose, Bld: 125 mg/dL — ABNORMAL HIGH (ref 70–99)
Potassium: 3.9 mmol/L (ref 3.5–5.1)
Sodium: 140 mmol/L (ref 135–145)

## 2022-06-18 LAB — TROPONIN I (HIGH SENSITIVITY)
Troponin I (High Sensitivity): 133 ng/L (ref <18)
Troponin I (High Sensitivity): 134 ng/L (ref <18)

## 2022-06-18 LAB — LACTIC ACID, PLASMA
Lactic Acid, Venous: 4.7 mmol/L (ref 0.5–1.9)
Lactic Acid, Venous: 4.3 mmol/L (ref 0.5–1.9)

## 2022-06-18 LAB — RESP PANEL BY RT-PCR (FLU A&B, COVID) ARPGX2
Influenza A by PCR: NEGATIVE
Influenza B by PCR: NEGATIVE
SARS Coronavirus 2 by RT PCR: NEGATIVE

## 2022-06-18 LAB — BRAIN NATRIURETIC PEPTIDE
B Natriuretic Peptide: 1267.5 pg/mL — ABNORMAL HIGH (ref 0.0–100.0)
B Natriuretic Peptide: 1915.9 pg/mL — ABNORMAL HIGH (ref 0.0–100.0)

## 2022-06-18 LAB — MAGNESIUM: Magnesium: 1.9 mg/dL (ref 1.7–2.4)

## 2022-06-18 MED ORDER — FUROSEMIDE 10 MG/ML IJ SOLN
40.0000 mg | Freq: Two times a day (BID) | INTRAMUSCULAR | Status: DC
  Administered 2022-06-18: 40 mg via INTRAVENOUS
  Filled 2022-06-18: qty 4

## 2022-06-18 MED ORDER — IPRATROPIUM-ALBUTEROL 0.5-2.5 (3) MG/3ML IN SOLN
3.0000 mL | Freq: Once | RESPIRATORY_TRACT | Status: AC
  Administered 2022-06-18: 3 mL via RESPIRATORY_TRACT
  Filled 2022-06-18: qty 3

## 2022-06-18 MED ORDER — LATANOPROST 0.005 % OP SOLN
1.0000 [drp] | Freq: Every day | OPHTHALMIC | Status: DC
  Administered 2022-06-19 – 2022-06-27 (×9): 1 [drp] via OPHTHALMIC
  Filled 2022-06-18: qty 2.5

## 2022-06-18 MED ORDER — SODIUM CHLORIDE 0.9 % IV SOLN: 2.0000 g | INTRAVENOUS | Status: DC

## 2022-06-18 MED ORDER — SODIUM CHLORIDE 0.9 % IV SOLN
1.0000 g | Freq: Once | INTRAVENOUS | Status: DC
  Administered 2022-06-18: 1 g via INTRAVENOUS
  Filled 2022-06-18: qty 10

## 2022-06-18 MED ORDER — POTASSIUM CHLORIDE 20 MEQ PO PACK
20.0000 meq | PACK | Freq: Two times a day (BID) | ORAL | Status: DC
  Administered 2022-06-18 – 2022-06-19 (×2): 20 meq via ORAL
  Filled 2022-06-18 (×2): qty 1

## 2022-06-18 MED ORDER — SODIUM CHLORIDE 0.9% FLUSH
3.0000 mL | Freq: Two times a day (BID) | INTRAVENOUS | Status: DC
  Administered 2022-06-18 – 2022-06-28 (×20): 3 mL via INTRAVENOUS

## 2022-06-18 MED ORDER — ALBUTEROL SULFATE (2.5 MG/3ML) 0.083% IN NEBU: 2.5000 mg | INHALATION_SOLUTION | RESPIRATORY_TRACT | Status: DC | PRN

## 2022-06-18 MED ORDER — ACETAMINOPHEN 325 MG PO TABS
650.0000 mg | ORAL_TABLET | Freq: Four times a day (QID) | ORAL | Status: DC | PRN
  Administered 2022-06-22 – 2022-06-23 (×2): 650 mg via ORAL
  Filled 2022-06-18 (×2): qty 2

## 2022-06-18 MED ORDER — METHYLPREDNISOLONE SODIUM SUCC 125 MG IJ SOLR
125.0000 mg | Freq: Once | INTRAMUSCULAR | Status: AC
  Administered 2022-06-18: 125 mg via INTRAVENOUS
  Filled 2022-06-18: qty 2

## 2022-06-18 MED ORDER — FOLIC ACID 1 MG PO TABS
1.0000 mg | ORAL_TABLET | Freq: Every day | ORAL | Status: DC
  Administered 2022-06-19 – 2022-06-28 (×10): 1 mg via ORAL
  Filled 2022-06-18 (×10): qty 1

## 2022-06-18 MED ORDER — ACETAMINOPHEN 650 MG RE SUPP: 650.0000 mg | Freq: Four times a day (QID) | RECTAL | Status: DC | PRN

## 2022-06-18 MED ORDER — ENOXAPARIN SODIUM 40 MG/0.4ML IJ SOSY
40.0000 mg | PREFILLED_SYRINGE | INTRAMUSCULAR | Status: DC
  Administered 2022-06-18 – 2022-06-27 (×10): 40 mg via SUBCUTANEOUS
  Filled 2022-06-18 (×10): qty 0.4

## 2022-06-18 MED ORDER — THIAMINE HCL 100 MG PO TABS
100.0000 mg | ORAL_TABLET | Freq: Every day | ORAL | Status: DC
  Administered 2022-06-19 – 2022-06-28 (×10): 100 mg via ORAL
  Filled 2022-06-18 (×9): qty 1

## 2022-06-18 MED ORDER — ATORVASTATIN CALCIUM 40 MG PO TABS
40.0000 mg | ORAL_TABLET | Freq: Every evening | ORAL | Status: DC
  Administered 2022-06-18 – 2022-06-27 (×9): 40 mg via ORAL
  Filled 2022-06-18 (×9): qty 1

## 2022-06-18 MED ORDER — SIMETHICONE 80 MG PO CHEW
125.0000 mg | CHEWABLE_TABLET | Freq: Two times a day (BID) | ORAL | Status: DC
  Administered 2022-06-18 – 2022-06-28 (×20): 120 mg via ORAL
  Filled 2022-06-18 (×20): qty 2

## 2022-06-18 MED ORDER — ASPIRIN 81 MG PO TBEC
81.0000 mg | DELAYED_RELEASE_TABLET | Freq: Every day | ORAL | Status: DC
  Administered 2022-06-19 – 2022-06-28 (×10): 81 mg via ORAL
  Filled 2022-06-18 (×10): qty 1

## 2022-06-18 MED ORDER — IOHEXOL 350 MG/ML SOLN
80.0000 mL | Freq: Once | INTRAVENOUS | Status: AC | PRN
  Administered 2022-06-18: 80 mL via INTRAVENOUS

## 2022-06-18 MED ORDER — ASPIRIN 81 MG PO CHEW
324.0000 mg | CHEWABLE_TABLET | Freq: Once | ORAL | Status: AC
Start: 2022-06-18 — End: 2022-06-18
  Administered 2022-06-18: 324 mg via ORAL
  Filled 2022-06-18: qty 4

## 2022-06-18 MED ORDER — SODIUM CHLORIDE 0.9 % IV SOLN
100.0000 mg | Freq: Two times a day (BID) | INTRAVENOUS | Status: DC
  Administered 2022-06-18 – 2022-06-20 (×3): 100 mg via INTRAVENOUS
  Filled 2022-06-18 (×5): qty 100

## 2022-06-18 MED ORDER — HYDRALAZINE HCL 25 MG PO TABS
25.0000 mg | ORAL_TABLET | Freq: Three times a day (TID) | ORAL | Status: DC
Start: 2022-06-18 — End: 2022-06-25
  Administered 2022-06-18 – 2022-06-24 (×14): 25 mg via ORAL
  Filled 2022-06-18 (×17): qty 1

## 2022-06-18 NOTE — ED Provider Notes (Signed)
[facility] Provider Note   CSN: [id number] Arrival date & time: 06/18/22  1050     History  Chief Complaint  Patient presents with   Shortness of Breath    Tommy Bauer is a 81 y.o. male with history of hypertension, previous CVA, hyperlipidemia.  Presents emergency department with the chief complaint of decreased oxygen saturation.  EMS reports that facility found patient lethargic, initial oxygen saturation they found within the 60s.  Patient had improvement in mentation after being placed on oxygen.  EMS reports that they are also found oxygen saturation in the 80s on room air.  EMS found decreased lung sounds throughout and started 5 mg albuterol treatment.    Patient has a history of dementia.  He is alert to person only.  Level 5 caveat applies.  Patient reports that he has been having shortness of breath for months.  Reports that his shortness of breath was worse today than it usually is.  Patient reports that he is a former smoker.  Patient reports that he started smoking when he was "a little kid," and just quit 2 years prior.  Patient's ex-wife is at bedside, she reports that she visits the patient and his SNF on a weekly basis.  Was unable to see the patient this week.  She reports that at baseline patient is alert to person and intermittently to place and time.  Patient is not on home oxygen.   Patient denies any chest pain, palpitations, lightheadedness, syncope, hemoptysis, leg swelling or tenderness, abdominal pain, nausea, vomiting, diarrhea, blood in stool, melena.   Shortness of Breath Associated symptoms: no abdominal pain, no chest pain, no cough, no fever, no headaches, no neck pain, no rash and no vomiting        Home Medications Prior to Admission medications   Medication Sig Start Date End Date Taking? Authorizing Provider  acetaminophen (TYLENOL) 500 MG tablet Take 500 mg by mouth every 4 (four) hours as needed for  moderate pain.     [provider]  ammonium lactate (AMLACTIN) 12 % cream Apply topically at bedtime. Apply to lower legs and feet    [provider]  aspirin 81 MG tablet Take 1 tablet (81 mg total) by mouth daily. 07/15/15   Marletta Lor, MD  atorvastatin (LIPITOR) 40 MG tablet Take 1 tablet (40 mg total) by mouth daily. 07/30/19   Nafziger, Tommi Rumps, NP  bimatoprost (LUMIGAN) 0.03 % ophthalmic solution Place 1 drop into both eyes at bedtime.    [provider]  Diclofenac Sodium 3 % GEL Apply 1 application topically 4 (four) times daily. To bilateral knees    [provider]  folic acid (FOLVITE) 1 MG tablet Take 1 mg by mouth daily.    [provider]  furosemide (LASIX) 40 MG tablet Take 40 mg by mouth daily.    [provider]  hydrALAZINE (APRESOLINE) 25 MG tablet Take 1 tablet (25 mg total) by mouth every 8 (eight) hours. 02/06/21   Shawna Clamp, MD  [employer] [facility] EX) Apply 1 application topically daily. Apply to scrotum and sacrum    [provider]  Multiple Vitamin (MULTIVITAMIN) tablet Take 1 tablet by mouth daily.    [provider]  potassium chloride (KLOR-CON) 20 MEQ packet Take 40 mEq by mouth daily. When taking lasix    [provider]  Simethicone 125 MG CAPS Take 1 capsule by mouth daily.    [provider]  Skin Protectants, Misc. (EUCERIN) cream Apply 1 application topically daily. Apply to extremities,trunk and back after bath    [provider]  thiamine 100 MG tablet Take 100 mg by mouth daily.    [provider]      Allergies    Patient has no known allergies.    Review of Systems   Review of Systems  Unable to perform ROS: Dementia  Constitutional:  Negative for chills and fever.  Eyes:  Negative for visual disturbance.  Respiratory:  Positive for shortness of breath. Negative for cough.   Cardiovascular:  Negative for chest pain,  palpitations and leg swelling.  Gastrointestinal:  Negative for abdominal pain, nausea and vomiting.  Genitourinary:  Negative for dysuria.  Musculoskeletal:  Negative for back pain and neck pain.  Skin:  Negative for color change and rash.  Neurological:  Negative for dizziness, syncope, light-headedness and headaches.  Psychiatric/Behavioral:  Negative for confusion.     Physical Exam Updated Vital Signs BP 138/68   Pulse (!) 105   Temp 98.4 F (36.9 C) (Oral)   Resp (!) 28   Ht '6\' 1"'$  (1.854 m)   Wt 99.8 kg   SpO2 98%   BMI 29.03 kg/m  Physical Exam Vitals and nursing note reviewed.  Constitutional:      General: He is not in acute distress.    Appearance: He is not ill-appearing, toxic-appearing or diaphoretic.     Comments: Receiving albuterol treatment at this time  HENT:     Head: Normocephalic.  Eyes:     General: No scleral icterus.       Right eye: No discharge.        Left eye: No discharge.  Cardiovascular:     Rate and Rhythm: Tachycardia present.     Pulses:          Radial pulses are 2+ on the right side and 2+ on the left side.  Pulmonary:     Effort: Pulmonary effort is normal. Tachypnea present.     Breath sounds: Examination of the right-upper field reveals decreased breath sounds and wheezing. Examination of the left-upper field reveals decreased breath sounds and wheezing. Examination of the right-middle field reveals decreased breath sounds and wheezing. Examination of the left-middle field reveals decreased breath sounds and wheezing. Examination of the right-lower field reveals decreased breath sounds and wheezing. Examination of the left-lower field reveals decreased breath sounds and wheezing. Decreased breath sounds and wheezing present.     Comments: Decreased lung sounds and expiratory/expiratory wheezing noted throughout. Abdominal:     General: Abdomen is flat. There is no distension. There are no signs of injury.     Palpations: Abdomen is  soft. There is no mass or pulsatile mass.     Tenderness: There is no abdominal tenderness. There is no guarding or rebound.  Musculoskeletal:     Right lower leg: 2+ Edema present.     Left lower leg: 2+ Edema present.  Skin:    General: Skin is warm and dry.  Neurological:     General: No focal deficit present.     Mental Status: He is alert and oriented to person, place, and time.     GCS: GCS eye subscore is 4. GCS verbal subscore is 5. GCS motor subscore is 6.  Psychiatric:        Behavior: Behavior is cooperative.     ED Results / Procedures / Treatments   Labs (all labs ordered are  listed, but only abnormal results are displayed) Labs Reviewed  I-STAT VENOUS BLOOD GAS, ED - Abnormal; Notable for the following components:      Result Value   pCO2, Ven 63.5 (*)    pO2, Ven 51 (*)    Bicarbonate 34.5 (*)    TCO2 36 (*)    Acid-Base Excess 6.0 (*)    Calcium, Ion 1.08 (*)    Hemoglobin 17.7 (*)    All other components within normal limits  TROPONIN I (HIGH SENSITIVITY) - Abnormal; Notable for the following components:   Troponin I (High Sensitivity) 133 (*)    All other components within normal limits  RESP PANEL BY RT-PCR (FLU A&B, COVID) ARPGX2  CBC WITH DIFFERENTIAL/PLATELET  BLOOD GAS, VENOUS  BRAIN NATRIURETIC PEPTIDE  CBC WITH DIFFERENTIAL/PLATELET  BASIC METABOLIC PANEL  TROPONIN I (HIGH SENSITIVITY)    EKG None  Radiology No results found.  Procedures .Critical Care  Performed by: Loni Beckwith, PA-C Authorized by: Loni Beckwith, PA-C   Critical care provider statement:    Critical care time (minutes):  30   Critical care was necessary to treat or prevent imminent or life-threatening deterioration of the following conditions:  Respiratory failure   Critical care was time spent personally by me on the following activities:  Development of treatment plan with patient or surrogate, evaluation of patient's response to treatment, examination  of patient, ordering and review of laboratory studies, ordering and review of radiographic studies, ordering and performing treatments and interventions, pulse oximetry, re-evaluation of patient's condition and review of old charts     Medications Ordered in ED Medications  ipratropium-albuterol (DUONEB) 0.5-2.5 (3) MG/3ML nebulizer solution 3 mL (has no administration in time range)  methylPREDNISolone sodium succinate (SOLU-MEDROL) 125 mg/2 mL injection 125 mg (has no administration in time range)    ED Course/ Medical Decision Making/ A&P Clinical Course as of 06/18/22 1111  Sat Jun 18, 2022  1106 Stable 81 YOM with AMS. Decreased air movement history of HTN,CVA. Patient was confused at his ALF. BLE on exam. EMS sats poor. On 2LNC. Tachycardic and tachypneic.   [CC]    Clinical Course User Index [CC] Tretha Sciara, MD                           Medical Decision Making Amount and/or Complexity of Data Reviewed Labs: ordered. Radiology: ordered.  Risk OTC drugs. Prescription drug management.   Alert 81 year old male in no acute distress, nontoxic-appearing.  Presents the emergency department complaint of shortness of breath.  Information was obtained from patient's ex-wife, EMS, and patient.  I reviewed patient's past medical records including previous prior notes, labs, and imaging.  Patient has medical history as outlined in HPI which complicates her care.  Patient noted to have decreased lung sounds throughout with inspiratory and expiratory wheezing noted to all lung fields.  Will give patient Solu-Medrol and DuoNeb.  Differential diagnosis includes but is not limited to ACS, COPD exacerbation, pneumonia, PE, COVID-19, acute CHF exacerbation.  I personally viewed interpret patient's chest x-ray.  Imaging shows enlarged heart, and pulmonary vascular congestion.  No acute consolidation or pulmonary edema.  I personally viewed and interpreted patient's lab results.   Pertinent findings include -Hypercapnia with CO2 of 63.5, compensated with bicarb elevated; suspect this is due to undiagnosed COPD. -Troponin 133; suspect this may be due to demand however cannot rule out right heart strain from PE with patient's new oxygen  requirement.  Will obtain CTA of chest for further evaluation -19 influenza negative -BMP unremarkable -Leukocytosis with white count of 12.9 -BNP pending  On serial reexamination patient's breathing improved after Solu-Medrol and DuoNeb.  Patient has minimal expiratory wheezing noted throughout however suspect this is baseline due to underlying COPD.  CTA pending at this time to evaluate for possible PE due to patient's new oxygen requirement.  If no PE is found would treat patient as a COPD exacerbation with antibiotics and admit due to his new oxygen requirement.  Results and plan of care were discussed with patient's ex-wife prior to her leaving the emergency department.  Patient care transferred to [place] at the end of my shift. Patient presentation, ED course, and plan of care discussed with review of all pertinent labs and imaging. Please see his/her note for further details regarding further ED course and disposition.]         Final Clinical Impression(s) / ED Diagnoses Final diagnoses:  None    Rx / DC Orders ED Discharge Orders     None         Loni Beckwith, PA-C 06/18/22 1520    Tretha Sciara, MD 06/18/22 1524

## 2022-06-18 NOTE — Consult Note (Signed)
Cardiology Consultation:   Patient ID: Tommy Bauer MRN: [id number]; DOB: [birth year]  Admit date: 06/18/2022 Date of Consult: 06/18/2022  PCP:  Garwin Brothers, MD   [facility] Providers Cardiologist:  None        Patient Profile:   Tommy Bauer is a 81 y.o. male with a hx of HFpEF (EF = 55-60%), HTN, HLD, prior CVA, prostate CA s/p XRT and dementia who is being seen 06/18/2022 for the evaluation of concern for CHF exacerbation at the request of Dr. Tamala Julian.  History of Present Illness:   Tommy Bauer presents from his SNF [facility] for acute hypoxic respiratory failure.  The patient has dementia and is unable to explain why he was admitted.  Per chart review, the patient was in his usual state of health until he was found to be hypoxic to the 60s% by staff at his SNF.  Given his hypoxia was brought into the [facility], ED.  In the ED his VS were afebrile, HR 105, BP 138/68, RR 28, and satting in the 80s% currently on 6L [place].  Labs notable for WBC 12.9, lactate 4.7, procalcitonin 6.86, ABG 7.35/52.1/70/bicarb 28.7, negative COVID and troponins 133 -> 134.  A CT PE was performed which was negative for PE but did reveal an opacification in the RLL concerning for atelectasis versus pneumonia, cardiomegaly with coronary artery calcifications and a dilated main PA, and multifocal mediastinal hilar adenopathy. EKG was negative for ischemic changes. Patient was started on CTX and doxycycline for pneumonia and given Lasix 40 Mg IV.  The patient denies chest pain, SOB, PND, orthopnea, swelling, N/V/D, weakness, or numbness.   Past Medical History:  Diagnosis Date   ADENOCARCINOMA, PROSTATE 05/19/2010   BACK PAIN 07/16/2008   Carpal tunnel syndrome 10/15/2008   COLONIC POLYPS, HX OF 06/08/2007   Contusion    left shoulder,left knee,s/p recent fall end of march 2013   CVA (cerebral vascular accident) [facility])    ERECTILE DYSFUNCTION, ORGANIC 07/11/2007   Headache(784.0) 07/11/2007   HYPERTENSION 06/08/2007    INGUINAL HERNIA 12/04/2007   LEFT VENTRICULAR FUNCTION, DECREASED 11/12/2007   Prostate cancer ([place]) 09/14/2009   prostateectomy,adenocarcinoma,gleason=4+3=7   PROSTATE SPECIFIC ANTIGEN, ELEVATED 05/06/2009   TOBACCO ABUSE 11/06/2009    Past Surgical History:  Procedure Laterality Date   HERNIA REPAIR     right ingunial   PROSTATE SURGERY  09/14/2009   radical prostatectomy /gleason=4+3-7     Home Medications:  Prior to Admission medications   Medication Sig Start Date End Date Taking? Authorizing Provider  acetaminophen (TYLENOL) 500 MG tablet Take 500 mg by mouth every 12 (twelve) hours.   Yes [provider]  aspirin 81 MG tablet Take 1 tablet (81 mg total) by mouth daily. 07/15/15  Yes Marletta Lor, MD  atorvastatin (LIPITOR) 40 MG tablet Take 1 tablet (40 mg total) by mouth daily. Patient taking differently: Take 40 mg by mouth every evening. 07/30/19  Yes Nafziger, Tommi Rumps, NP  folic acid (FOLVITE) 1 MG tablet Take 1 mg by mouth daily.   Yes [provider]  furosemide (LASIX) 40 MG tablet Take 40 mg by mouth daily.   Yes [provider]  hydrALAZINE (APRESOLINE) 25 MG tablet Take 1 tablet (25 mg total) by mouth every 8 (eight) hours. 02/06/21  Yes Shawna Clamp, MD  latanoprost (XALATAN) 0.005 % ophthalmic solution Place 1 drop into both eyes at bedtime. 05/22/22  Yes [provider]  POTASSIUM CHLORIDE PO Take 20 mEq by mouth 2 (two)  times daily.   Yes [provider]  Simethicone 125 MG CAPS Take 125 mg by mouth in the morning, at noon, and at bedtime.   Yes [provider]  thiamine 100 MG tablet Take 100 mg by mouth daily.   Yes [provider]    Inpatient Medications: Scheduled Meds:  [START ON 06/19/2022] aspirin EC  81 mg Oral Daily   atorvastatin  40 mg Oral QPM   enoxaparin (LOVENOX) injection  40 mg Subcutaneous Q24H   [START ON 1/61/0960] folic acid  1 mg Oral Daily   furosemide  40 mg Intravenous BID    hydrALAZINE  25 mg Oral Q8H   latanoprost  1 drop Both Eyes QHS   potassium chloride  20 mEq Oral BID   simethicone  120 mg Oral BID   sodium chloride flush  3 mL Intravenous Q12H   [START ON 06/19/2022] thiamine  100 mg Oral Daily   Continuous Infusions:  [START ON 06/19/2022] cefTRIAXone (ROCEPHIN)  IV     doxycycline (VIBRAMYCIN) IV 100 mg (06/18/22 1800)   PRN Meds: acetaminophen **OR** acetaminophen, albuterol  Allergies:   No Known Allergies  Social History:   Social History   Socioeconomic History   Marital status: Married    Spouse name: Catalina Lunger   Number of children: Not on file   Years of education: Not on file   Highest education level: Not on file  Occupational History    Employer: RETIRED    Comment: retired Games developer  Tobacco Use   Smoking status: Former    Years: 60.00    Types: Cigarettes   Smokeless tobacco: Never  Substance and Sexual Activity   Alcohol use: Not Currently   Drug use: No   Sexual activity: Not on file  Other Topics Concern   Not on file  Social History Narrative   Retired Games developer    Married for 54 years but legally separated.    Lives at [facility]   Right Handed   Cannot read   Drinks no caffeine   Social Determinants of Health   Financial Resource Strain: Not on file  Food Insecurity: Not on file  Transportation Needs: Not on file  Physical Activity: Not on file  Stress: Not on file  Social Connections: Not on file  Intimate Partner Violence: Not on file    Family History:    Family History  Problem Relation Age of Onset   Colon cancer Neg Hx      ROS:  Please see the history of present illness.   All other ROS reviewed and negative.     Physical Exam/Data:   Vitals:   06/18/22 1330 06/18/22 1515 06/18/22 1530 06/18/22 1800  BP: (!) 144/81 134/71 131/71 (!) 144/79  Pulse: 95 93 92 86  Resp: 15 18  (!) 21  Temp:    98.2 F (36.8 C)  TempSrc:    Oral  SpO2: 94% 91% 92% 95%  Weight:       Height:       No intake or output data in the 24 hours ending 06/18/22 1902    06/18/2022   11:00 AM 09/20/2021   10:23 AM 02/12/2021    8:50 AM  Last 3 Weights  Weight (lbs) 220 lb 0.3 oz 220 lb 225 lb 1.4 oz  Weight (kg) 99.8 kg 99.791 kg 102.1 kg     Body mass index is 29.03 kg/m.  General:  Well nourished, well developed, wearing  oxygen lying in bed HEENT: OP clear, MMM, EOMI Neck: no JVD at 90 degrees Vascular: Radial pulses 2+ bilaterally, DP pulses 1+ bilaterally Cardiac:  normal S1, S2; RRR; no murmur, rubs or gallops Lungs: Focal Rales heard in RLL, no Rales, wheezes, or rhonchi heard elsewhere Abd: soft, nontender, no hepatomegaly  Ext: 2+ pitting edema bilaterally to knees, WWP Musculoskeletal:  No deformities, BUE and BLE strength normal and equal Skin: warm and dry  Neuro:  CNs 2-12 intact, no focal abnormalities noted Psych:  Normal affect, A&O x1 (self)  EKG:   Telemetry:  Telemetry was personally reviewed and demonstrates: NSR  Relevant CV Studies:  TTE 09/14/20:  IMPRESSIONS     1. Left ventricular ejection fraction, by estimation, is 55 to 60%. The  left ventricle has normal function. Left ventricular endocardial border  not optimally defined to evaluate regional wall motion. Left ventricular  diastolic parameters are  indeterminate.   2. Right ventricular systolic function is normal. The right ventricular  size is normal.   3. The mitral valve is normal in structure. Trivial mitral valve  regurgitation. No evidence of mitral stenosis.   4. The aortic valve is normal in structure. Aortic valve regurgitation is  not visualized. No aortic stenosis is present.   5. The inferior vena cava is normal in size with greater than 50%  respiratory variability, suggesting right atrial pressure of 3 mmHg.   Laboratory Data:  High Sensitivity Troponin:   Recent Labs  Lab 06/18/22 1121 06/18/22 1302  TROPONINIHS 133* 134*     Chemistry Recent Labs   Lab 06/18/22 1134 06/18/22 1351 06/18/22 1748  NA 141 140 141  K 4.7 3.9 4.0  CL  --  101  --   CO2  --  26  --   GLUCOSE  --  125*  --   BUN  --  13  --   CREATININE  --  1.24  --   CALCIUM  --  8.9  --   GFRNONAA  --  58*  --   ANIONGAP  --  13  --     No results for input(s): "PROT", "ALBUMIN", "AST", "ALT", "ALKPHOS", "BILITOT" in the last 168 hours. Lipids No results for input(s): "CHOL", "TRIG", "HDL", "LABVLDL", "LDLCALC", "CHOLHDL" in the last 168 hours.  Hematology Recent Labs  Lab 06/18/22 1134 06/18/22 1351 06/18/22 1748  WBC  --  12.9*  --   RBC  --  5.44  --   HGB 17.7* 14.9 16.7  HCT 52.0 49.7 49.0  MCV  --  91.4  --   MCH  --  27.4  --   MCHC  --  30.0  --   RDW  --  17.4*  --   PLT  --  182  --    Thyroid No results for input(s): "TSH", "FREET4" in the last 168 hours.  BNPNo results for input(s): "BNP", "PROBNP" in the last 168 hours.  DDimer No results for input(s): "DDIMER" in the last 168 hours.   Radiology/Studies:  CT Angio Chest PE W and/or Wo Contrast  Result Date: 06/18/2022 CLINICAL DATA:  Pulmonary embolism (PE) suspected, high prob Shortness of breath.  Lethargy. EXAM: CT ANGIOGRAPHY CHEST WITH CONTRAST TECHNIQUE: Multidetector CT imaging of the chest was performed using the standard protocol during bolus administration of intravenous contrast. Multiplanar CT image reconstructions and MIPs were obtained to evaluate the vascular anatomy. RADIATION DOSE REDUCTION: This exam was performed according to the departmental dose-optimization program  which includes automated exposure control, adjustment of the mA and/or kV according to patient size and/or use of iterative reconstruction technique. CONTRAST:  71m OMNIPAQUE IOHEXOL 350 MG/ML SOLN COMPARISON:  Radiograph earlier today. FINDINGS: Cardiovascular: Examination is diagnostic to the level of the segmental pulmonary arteries. The subsegmental branches are not well assessed. Allowing for this  limitation, there are no filling defects within the pulmonary arteries to suggest pulmonary embolus. Dilated main pulmonary artery 3.7 cm. Moderate multi chamber cardiomegaly. No pericardial effusion. There are coronary artery calcifications. Atherosclerosis of the thoracic aorta. No aneurysm. Cannot assess for dissection given phase of contrast tailored to pulmonary artery assessment. Mediastinum/Nodes: Mild multifocal mediastinal and hilar adenopathy and shotty lymph nodes. For example AP window node measures 14 mm, series 5, image 43. right hilar node measures 13 mm, series 5, image 60. Left hilar nodes measure up to 13 mm series 5, image 64. The esophagus is decompressed. No visualized thyroid nodule. No bulky axillary adenopathy. Lungs/Pleura: Elevated left hemidiaphragm with adjacent compressive atelectasis in the lingula and left lower lobe. There is also mild dependent atelectasis within both lungs. Ill-defined opacity in the medial aspect of the right lower lobe. No pleural fluid. No features of pulmonary edema. Minimal retained mucus in the trachea. Imaging obtained in expiration limiting assessment of the trachea and central bronchi. No pulmonary mass. Upper Abdomen: No acute findings. Simple left renal cyst. No follow-up imaging is needed. Musculoskeletal: Thoracic spondylosis with multilevel endplate spurring. Prominent Schmorl's node involves superior endplate of TA21 Review of the MIP images confirms the above findings. IMPRESSION: 1. No pulmonary embolus. 2. Ill-defined opacity in the medial aspect of the right lower lobe may be atelectasis or pneumonia. 3. Moderate multi chamber cardiomegaly with coronary artery calcifications. 4. Dilated main pulmonary artery suggesting pulmonary arterial hypertension. 5. Mild multifocal mediastinal and hilar adenopathy and shotty lymph nodes, likely reactive. 6. Elevated left hemidiaphragm with adjacent compressive atelectasis in the lingula and left lower lobe.  Aortic Atherosclerosis (ICD10-I70.0). Electronically Signed   By: MKeith RakeM.D.   On: 06/18/2022 16:14   DG Chest Portable 1 View  Result Date: 06/18/2022 CLINICAL DATA:  Shortness of breath. EXAM: PORTABLE CHEST 1 VIEW COMPARISON:  09/13/2020 FINDINGS: There is rotational artifact. Apparent cardiac enlargement may reflect rotational artifact. Pulmonary vascular congestion is noted. Unchanged chronic asymmetric elevation of the left hemidiaphragm with overlying scar versus atelectasis. No signs pleural effusion or frank interstitial edema. No airspace opacities. IMPRESSION: 1. Cardiac enlargement, increased from the previous exam. 2. Pulmonary vascular congestion. 3. Chronic asymmetric elevation of the left hemidiaphragm with overlying atelectasis or scar. Electronically Signed   By: TKerby MoorsM.D.   On: 06/18/2022 12:38     Assessment and Plan:   JDace Dennis a 81y.o. male with a hx of HFpEF (EF = 55-60%), HTN, HLD, prior CVA, prostate CA s/p XRT and dementia who is being seen 06/18/2022 for the evaluation of concern for CHF exacerbation at the request of Dr. STamala Julian  #Acute Hypoxic Respiratory Failure #Sepsis 2/2 RLL Pneumonia #Elevated Troponins :: Patient admitted with acute hypoxic respiratory failure found to have an RLL infiltrate with an elevated procalcitonin and reactive mediastinal lymphadenopathy all concerning for sepsis secondary to pneumonia.  Cardiology was consulted for evaluation of heart failure and elevated troponins.  The patient is not complaining of any symptoms and his troponins are elevated but flat.  Furthermore his EKG is negative for ischemic changes.  These findings are not consistent with an MI  and his troponins are elevated due to sepsis.  Furthermore, he has bilateral lower extremity edema but has no evidence of volume overload anywhere else (no JVD, no pulm edema).  He has chronic hypoalbuminemia which could explain his lower extremity edema.  I am fine  with giving him IV diuresis as needed, but is unclear how chronic his lower extremity edema is. Recommend treatment of his pneumonia and sepsis along with getting an echo to evaluate his cardiac function described below. -Treatment of sepsis/pneumonia and hypoxic respiratory failure per primary team -Troponins elevated due to sepsis  #Chronic HFpEF (EF = 55-60%) #Dilated Main PA #Bilateral Lower Extremity Edema :: Has known HFpEF and found to have a dilated main PA on CT PE on admission.  He has significant bilateral lower extremity edema but no JVD or pulmonary edema.  His lower extreme edema could represent RV dysfunction versus hypoalbuminemia.  I agree with getting an echo to evaluate his cardiac function and to calculate an RVSP for evidence of pulmonary hypertension.  I would not give the patient standing IV Lasix but rather as needed IV Lasix pending echo assessment and daily volume status evaluation. -Echo complete for LV/RV function, RVSP and IVC dilation -s/p 40 mg IV lasix x1 -Discontinue standing IV Lasix but rather give as needed pending Daily volume assessment -Follow-up BNP -Maintain telemetry -Compression stockings bilaterally  #Coronary Artery Calcifications on CT #HLD :: Noted to have coronary calcifications on CT.  Patient already takes baby aspirin and atorvastatin.  We will get a lipid panel because knowing that he has CAD and is that he would benefit from having his LDL <70. -lipid panel -continue home ASA and atorvastatin    Risk Assessment/Risk Scores:        New York Heart Association (NYHA) Functional Class NYHA Class III        For questions or updates, please contact [facility] Please consult [email or web address] for contact info under    Signed, Hershal Coria, MD  06/18/2022 7:02 PM

## 2022-06-18 NOTE — ED Provider Notes (Signed)
Pt's care asummed at 3:30 from [facility].  Ct angio chest pending. Ct returns no pe.  Possible pneumonia.  Iv antibiotic started.   Pt requires admission for new hypoxia with o2 requirement.  Pneumonia vs Copd.    Tommy Bauer 06/18/22 1635    Isla Pence, MD 06/18/22 1836

## 2022-06-18 NOTE — Progress Notes (Signed)
Tele Monitor tech called and pt just had a 4 beat run and a 3 beat run of NSVT. Pt asymptomatic and talking to this Probation officer

## 2022-06-18 NOTE — H&P (Addendum)
History and Physical    Patient: Tommy Bauer KGU:[id number] DOB: 06-28-1941 DOA: 06/18/2022 DOS: the patient was seen and examined on 06/18/2022 PCP: Garwin Brothers, MD  Patient coming from: [facility] via EMS  Chief Complaint:  Chief Complaint  Patient presents with   Shortness of Breath   HPI: Tommy Bauer is a 81 y.o. male with medical history significant of hypertension, hyperlipidemia, CVA, central retinal artery occlusion, prostate caner s/p radiation, and prior tobacco abuse who presented after being found to be hypoxic by facility staff. Normally he was not on oxygen. His ex wife who comes to visit him regularly notes that his memory has been on the decline, but he is able to recognize her and their three children.  She is states he usually does not remember his age and currently cannot tell me why he is here.  He sustained a wound on the right side of his head after being hospitalized for abscess and cellulitis of the scalp last year.  Ever since that time he has been at the [facility] care.  Patient previously smoked since the age of 36, but quit years ago..  In the ED patient was noted to be tachycardic and tachypneic with O2 saturations currently maintained on 6 L nasal cannula oxygen.  Significant for WBC 12.9, BUN 13, creatinine 1.24, and high-sensitivity troponin 133->134.  Influenza and COVID-19 screening were negative.  CTA of the chest revealed no signs of a pulmonary embolism with ill-defined opacity of the medial aspect of the right lower lobe concerning for atelectasis versus pneumonia, multi chamber cardiomegaly, mild multifocal mediastinal and hilar adenopathy and shotty lymph nodes, and dilated pulmonary artery concerning for pulmonary artery hypertension.  Patient has been given 125 mg of Solu-Medrol, DuoNeb breathing treatments, and full dose aspirin. Review of Systems: As mentioned in the history of present illness. All other systems reviewed and are negative. Past  Medical History:  Diagnosis Date   ADENOCARCINOMA, PROSTATE 05/19/2010   BACK PAIN 07/16/2008   Carpal tunnel syndrome 10/15/2008   COLONIC POLYPS, HX OF 06/08/2007   Contusion    left shoulder,left knee,s/p recent fall end of march 2013   CVA (cerebral vascular accident) [facility])    ERECTILE DYSFUNCTION, ORGANIC 07/11/2007   Headache(784.0) 07/11/2007   HYPERTENSION 06/08/2007   INGUINAL HERNIA 12/04/2007   LEFT VENTRICULAR FUNCTION, DECREASED 11/12/2007   Prostate cancer ([place]) 09/14/2009   prostateectomy,adenocarcinoma,gleason=4+3=7   PROSTATE SPECIFIC ANTIGEN, ELEVATED 05/06/2009   TOBACCO ABUSE 11/06/2009   Past Surgical History:  Procedure Laterality Date   HERNIA REPAIR     right ingunial   PROSTATE SURGERY  09/14/2009   radical prostatectomy /gleason=4+3-7   Social History:  reports that he has quit smoking. His smoking use included cigarettes. He has never used smokeless tobacco. He reports that he does not currently use alcohol. He reports that he does not use drugs.  No Known Allergies  Family History  Problem Relation Age of Onset   Colon cancer Neg Hx     Prior to Admission medications   Medication Sig Start Date End Date Taking? Authorizing Provider  acetaminophen (TYLENOL) 500 MG tablet Take 500 mg by mouth every 4 (four) hours as needed for moderate pain.     [provider]  ammonium lactate (AMLACTIN) 12 % cream Apply topically at bedtime. Apply to lower legs and feet    [provider]  aspirin 81 MG tablet Take 1 tablet (81 mg total) by mouth daily. 07/15/15   Burnice Logan,  Doretha Sou, MD  atorvastatin (LIPITOR) 40 MG tablet Take 1 tablet (40 mg total) by mouth daily. 07/30/19   Nafziger, Tommi Rumps, NP  bimatoprost (LUMIGAN) 0.03 % ophthalmic solution Place 1 drop into both eyes at bedtime.    [provider]  Diclofenac Sodium 3 % GEL Apply 1 application topically 4 (four) times daily. To bilateral knees    [provider]  folic acid (FOLVITE) 1  MG tablet Take 1 mg by mouth daily.    [provider]  furosemide (LASIX) 40 MG tablet Take 40 mg by mouth daily.    [provider]  hydrALAZINE (APRESOLINE) 25 MG tablet Take 1 tablet (25 mg total) by mouth every 8 (eight) hours. 02/06/21   Shawna Clamp, MD  [employer] [facility] EX) Apply 1 application topically daily. Apply to scrotum and sacrum    [provider]  Multiple Vitamin (MULTIVITAMIN) tablet Take 1 tablet by mouth daily.    [provider]  potassium chloride (KLOR-CON) 20 MEQ packet Take 40 mEq by mouth daily. When taking lasix    [provider]  Simethicone 125 MG CAPS Take 1 capsule by mouth daily.    [provider]  Skin Protectants, Misc. (EUCERIN) cream Apply 1 application topically daily. Apply to extremities,trunk and back after bath    [provider]  thiamine 100 MG tablet Take 100 mg by mouth daily.    [provider]    Physical Exam: Vitals:   06/18/22 1230 06/18/22 1330 06/18/22 1515 06/18/22 1530  BP: 133/72 (!) 144/81 134/71 131/71  Pulse: 100 95 93 92  Resp: (!) '27 15 18   '$ Temp:      TempSrc:      SpO2: 95% 94% 91% 92%  Weight:      Height:        Constitutional: Elderly male who appears to be in no acute distress Eyes: PERRL, lids and conjunctivae normal ENMT: Mucous membranes are moist.    Neck: normal, supple, JVD present  Respiratory: Normal respiratory effort with decreased overall aeration and crackles heard throughout the mid to lower lung fields.  There may have been some mild wheezing appreciated. Cardiovascular: Irregular heart rhythm.  At least 2+ pitting bilateral lower extremity edema.   Abdomen: no tenderness, no masses palpated.  Bowel sounds positive.  Musculoskeletal: Clubbing present.  No joint deformity upper and lower extremities. Good ROM, no contractures. Normal muscle tone.  Skin: Healed wound of the right side of the head Neurologic: CN 2-12  grossly intact.  Mild tremor noted of the right hand.  Strength 5/5 in all 4.  Psychiatric: Patient alert and oriented to self, but not place or time.  Data Reviewed:  EKG showed 105 bpm with A-V dissociation and ventricular trigeminy with QTc 488  Assessment and Plan:  Acute respiratory failure with hypoxia and hypercapnia possible sepsis secondary to pneumonia Patient was noted to be acutely short of breath with O2 saturations noted to be as low as 60s at the facility.  He is not on oxygen at baseline.  He was tachycardic and tachypneic with labs WBC 12.9 meeting SIRS criteria.  CTA of the chest did not note any signs of pulm embolus, but did note concern for possible pneumonia with lymphadenopathy appreciated.  Venous blood gas noted elevated PCO2 of 63. -Admit to a progressive bed -Continuous pulse oximetry with oxygen maintain O2 saturation greater than 92% -Follow-up ABG (pH 7.349, PCO2 52.1, PO2 70) patient does not  appear to require BiPAP at this time -Check blood cultures -Check procalcitonin and lactic acid -Check MRSA screen, urine Legionella, urine strep -Empiric antibiotics of Rocephin and doxycycline due to borderline prolonged QT -There is a possibility of bronchitis as he has no formal diagnosis of COPD it seems but had significant history of smoking.  Reassess in a.m. to determine if patient needs continuation of steroids. -Breathing treatments as needed -Recheck CBC tomorrow morning  Heart failure with preserved EF  pulmonary artery hypertension Patient appears to have least 2+ pitting bilateral lower extremity edema with signs of JVD on physical exam.  CT noted significantly dilated chambers of the heart.  His last EF was noted to be 55 to 60% with indeterminate diastolic parameters back in 2021.  Patient has been on furosemide 40 mg daily. -Strict intake and output and daily weights -Follow-up BNP -Lasix 40 mg IV twice daily -Check echocardiogram -Cardiology  consulted, will follow-up for further recommendations  Elevated troponin   Acute on chronic.  On admission high-sensitivity troponins 133->134, but previously had been noted to be elevated back in 2021..  CT also noted coronary artery calcifications.  Patient has been given full dose aspirin. -Follow-up echocardiogram -Continue aspirin and statin  Abnormal EKG EKG revealed A-V dissociation with ventricular trigeminy with QTc 488. -Follow-up telemetry  Dementia Patient has memory loss and has been seen previously by neurology for which he was diagnosed.  Normally able to recognize family, but can never remember things like how old he is or dates and time. -Delirium precautions  Essential hypertension Home blood pressure regimen includes hydralazine 25 mg 3 times daily and furosemide 40 mg daily. -Continue hydralazine once med reconciliation completed  Hyperlipidemia -Continue atorvastatin  History of CVA -Continue aspirin and Lipitor  CKD stage IIIa -Continue to monitor kidney function with diuresis  History of prostate cancer Patient patient had received radiation treatments for prostate cancer over 11 years ago.  DVT prophylaxis: Lovenox Advance Care Planning:   Code Status: Full Code    Consults: Cardiology  Family Communication: Ex-wife updated over the phone  Severity of Illness: The appropriate patient status for this patient is INPATIENT. Inpatient status is judged to be reasonable and necessary in order to provide the required intensity of service to ensure the patient's safety. The patient's presenting symptoms, physical exam findings, and initial radiographic and laboratory data in the context of their chronic comorbidities is felt to place them at high risk for further clinical deterioration. Furthermore, it is not anticipated that the patient will be medically stable for discharge from the hospital within 2 midnights of admission.   * I certify that at the point of  admission it is my clinical judgment that the patient will require inpatient hospital care spanning beyond 2 midnights from the point of admission due to high intensity of service, high risk for further deterioration and high frequency of surveillance required.*  Author: Norval Morton, MD 06/18/2022 5:01 PM  For on call review [email or web address].

## 2022-06-18 NOTE — ED Triage Notes (Signed)
Pt BIBA for c/o SOB at [facility]. Per facility pt was lethargic with an O2 SAT in the 60s. Placed on 4L and Pt was A&O x4. Pts O2 with EMS dropped quickly to the 80s on RA. Albuterol treatment under way.

## 2022-06-19 ENCOUNTER — Inpatient Hospital Stay (HOSPITAL_COMMUNITY): Payer: Medicare Other

## 2022-06-19 DIAGNOSIS — J9601 Acute respiratory failure with hypoxia: Secondary | ICD-10-CM | POA: Diagnosis not present

## 2022-06-19 DIAGNOSIS — I517 Cardiomegaly: Secondary | ICD-10-CM | POA: Diagnosis not present

## 2022-06-19 DIAGNOSIS — I5033 Acute on chronic diastolic (congestive) heart failure: Secondary | ICD-10-CM | POA: Diagnosis not present

## 2022-06-19 DIAGNOSIS — J9602 Acute respiratory failure with hypercapnia: Secondary | ICD-10-CM | POA: Diagnosis not present

## 2022-06-19 LAB — BLOOD GAS, ARTERIAL
Acid-Base Excess: 8.8 mmol/L — ABNORMAL HIGH (ref 0.0–2.0)
Bicarbonate: 36.7 mmol/L — ABNORMAL HIGH (ref 20.0–28.0)
Drawn by: 404151
O2 Saturation: 96.4 %
Patient temperature: 36.8
pCO2 arterial: 65 mmHg — ABNORMAL HIGH (ref 32–48)
pH, Arterial: 7.36 (ref 7.35–7.45)
pO2, Arterial: 76 mmHg — ABNORMAL LOW (ref 83–108)

## 2022-06-19 LAB — BASIC METABOLIC PANEL
Anion gap: 9 (ref 5–15)
BUN: 18 mg/dL (ref 8–23)
CO2: 30 mmol/L (ref 22–32)
Calcium: 8.9 mg/dL (ref 8.9–10.3)
Chloride: 104 mmol/L (ref 98–111)
Creatinine, Ser: 1.14 mg/dL (ref 0.61–1.24)
GFR, Estimated: >60 mL/min (ref >60)
Glucose, Bld: 83 mg/dL (ref 70–99)
Potassium: 4.9 mmol/L (ref 3.5–5.1)
Sodium: 143 mmol/L (ref 135–145)

## 2022-06-19 LAB — LIPID PANEL
Cholesterol: 102 mg/dL (ref 0–200)
HDL: 36 mg/dL — ABNORMAL LOW (ref >40)
LDL Cholesterol: 62 mg/dL (ref 0–99)
Total CHOL/HDL Ratio: 2.8 RATIO
Triglycerides: 22 mg/dL (ref <150)
VLDL: 4 mg/dL (ref 0–40)

## 2022-06-19 LAB — ECHOCARDIOGRAM COMPLETE
Area-P 1/2: 4.36 cm2
Height: 73 in
S' Lateral: 4.2 cm
Weight: 3657.87 oz

## 2022-06-19 LAB — MRSA NEXT GEN BY PCR, NASAL: MRSA by PCR Next Gen: NOT DETECTED

## 2022-06-19 LAB — STREP PNEUMONIAE URINARY ANTIGEN: Strep Pneumo Urinary Antigen: NEGATIVE

## 2022-06-19 MED ORDER — PANTOPRAZOLE SODIUM 40 MG PO TBEC
40.0000 mg | DELAYED_RELEASE_TABLET | Freq: Every day | ORAL | Status: DC
  Administered 2022-06-19 – 2022-06-28 (×10): 40 mg via ORAL
  Filled 2022-06-19 (×10): qty 1

## 2022-06-19 MED ORDER — FUROSEMIDE 10 MG/ML IJ SOLN
40.0000 mg | Freq: Two times a day (BID) | INTRAMUSCULAR | Status: DC
  Administered 2022-06-19: 40 mg via INTRAVENOUS
  Filled 2022-06-19: qty 4

## 2022-06-19 MED ORDER — FUROSEMIDE 10 MG/ML IJ SOLN
40.0000 mg | Freq: Two times a day (BID) | INTRAMUSCULAR | Status: AC
  Administered 2022-06-19: 40 mg via INTRAVENOUS
  Filled 2022-06-19: qty 4

## 2022-06-19 MED ORDER — PERFLUTREN LIPID MICROSPHERE
1.0000 mL | INTRAVENOUS | Status: AC | PRN
  Administered 2022-06-19: 2 mL via INTRAVENOUS

## 2022-06-19 MED ORDER — POTASSIUM CHLORIDE 20 MEQ PO PACK
20.0000 meq | PACK | Freq: Two times a day (BID) | ORAL | Status: AC
  Administered 2022-06-19: 20 meq via ORAL
  Filled 2022-06-19: qty 1

## 2022-06-19 NOTE — Progress Notes (Signed)
Progress Note  Patient Name: Tommy Bauer Date of Encounter: 06/19/2022  [facility] Cardiologist: New  Subjective   No complaints  Inpatient Medications    Scheduled Meds:  aspirin EC  81 mg Oral Daily   atorvastatin  40 mg Oral QPM   enoxaparin (LOVENOX) injection  40 mg Subcutaneous H20N   folic acid  1 mg Oral Daily   furosemide  40 mg Intravenous BID   hydrALAZINE  25 mg Oral Q8H   latanoprost  1 drop Both Eyes QHS   pantoprazole  40 mg Oral Daily   potassium chloride  20 mEq Oral BID   simethicone  120 mg Oral BID   sodium chloride flush  3 mL Intravenous Q12H   thiamine  100 mg Oral Daily   Continuous Infusions:  cefTRIAXone (ROCEPHIN)  IV     doxycycline (VIBRAMYCIN) IV 100 mg (06/19/22 4709)   PRN Meds: acetaminophen **OR** acetaminophen, albuterol   Vital Signs    Vitals:   06/19/22 0400 06/19/22 0421 06/19/22 0551 06/19/22 0845  BP: 123/72   138/83  Pulse: 60 (!) 59 78 75  Resp: '15 17 17 15  '$ Temp: 98.4 F (36.9 C)   97.8 F (36.6 C)  TempSrc: Oral   Oral  SpO2: 93% 100% 96% 92%  Weight:      Height:        Intake/Output Summary (Last 24 hours) at 06/19/2022 1233 Last data filed at 06/18/2022 2050 Gross per 24 hour  Intake --  Output 30 ml  Net -30 ml      06/18/2022    7:50 PM 06/18/2022   11:00 AM 09/20/2021   10:23 AM  Last 3 Weights  Weight (lbs) 228 lb 9.9 oz 220 lb 0.3 oz 220 lb  Weight (kg) 103.7 kg 99.8 kg 99.791 kg      Telemetry    SR, 1st degree av block - Personally Reviewed  ECG    N/a - Personally Reviewed  Physical Exam   GEN: No acute distress.   Neck: + JVD Cardiac: RRR, no murmurs, rubs, or gallops.  Respiratory: Crackles bilaterally GI: Soft, nontender, non-distended  MS: 2+ bilaterla LE edema Neuro:  Nonfocal  Psych: Normal affect   Labs    High Sensitivity Troponin:   Recent Labs  Lab 06/18/22 1121 06/18/22 1302  TROPONINIHS 133* 134*     Chemistry Recent Labs  Lab 06/18/22 1134  06/18/22 1351 06/18/22 1748 06/18/22 2008  NA 141 140 141  --   K 4.7 3.9 4.0  --   CL  --  101  --   --   CO2  --  26  --   --   GLUCOSE  --  125*  --   --   BUN  --  13  --   --   CREATININE  --  1.24  --   --   CALCIUM  --  8.9  --   --   MG  --   --   --  1.9  GFRNONAA  --  58*  --   --   ANIONGAP  --  13  --   --     Lipids  Recent Labs  Lab 06/19/22 0925  CHOL 102  TRIG 22  HDL 36*  LDLCALC 62  CHOLHDL 2.8    Hematology Recent Labs  Lab 06/18/22 1134 06/18/22 1351 06/18/22 1748  WBC  --  12.9*  --   RBC  --  5.44  --   HGB 17.7* 14.9 16.7  HCT 52.0 49.7 49.0  MCV  --  91.4  --   MCH  --  27.4  --   MCHC  --  30.0  --   RDW  --  17.4*  --   PLT  --  182  --    Thyroid No results for input(s): "TSH", "FREET4" in the last 168 hours.  BNP Recent Labs  Lab 06/18/22 1351 06/18/22 2225  BNP 1,267.5* 1,915.9*    DDimer No results for input(s): "DDIMER" in the last 168 hours.   Radiology    CT Angio Chest PE W and/or Wo Contrast  Result Date: 06/18/2022 CLINICAL DATA:  Pulmonary embolism (PE) suspected, high prob Shortness of breath.  Lethargy. EXAM: CT ANGIOGRAPHY CHEST WITH CONTRAST TECHNIQUE: Multidetector CT imaging of the chest was performed using the standard protocol during bolus administration of intravenous contrast. Multiplanar CT image reconstructions and MIPs were obtained to evaluate the vascular anatomy. RADIATION DOSE REDUCTION: This exam was performed according to the departmental dose-optimization program which includes automated exposure control, adjustment of the mA and/or kV according to patient size and/or use of iterative reconstruction technique. CONTRAST:  92m OMNIPAQUE IOHEXOL 350 MG/ML SOLN COMPARISON:  Radiograph earlier today. FINDINGS: Cardiovascular: Examination is diagnostic to the level of the segmental pulmonary arteries. The subsegmental branches are not well assessed. Allowing for this limitation, there are no filling defects  within the pulmonary arteries to suggest pulmonary embolus. Dilated main pulmonary artery 3.7 cm. Moderate multi chamber cardiomegaly. No pericardial effusion. There are coronary artery calcifications. Atherosclerosis of the thoracic aorta. No aneurysm. Cannot assess for dissection given phase of contrast tailored to pulmonary artery assessment. Mediastinum/Nodes: Mild multifocal mediastinal and hilar adenopathy and shotty lymph nodes. For example AP window node measures 14 mm, series 5, image 43. right hilar node measures 13 mm, series 5, image 60. Left hilar nodes measure up to 13 mm series 5, image 64. The esophagus is decompressed. No visualized thyroid nodule. No bulky axillary adenopathy. Lungs/Pleura: Elevated left hemidiaphragm with adjacent compressive atelectasis in the lingula and left lower lobe. There is also mild dependent atelectasis within both lungs. Ill-defined opacity in the medial aspect of the right lower lobe. No pleural fluid. No features of pulmonary edema. Minimal retained mucus in the trachea. Imaging obtained in expiration limiting assessment of the trachea and central bronchi. No pulmonary mass. Upper Abdomen: No acute findings. Simple left renal cyst. No follow-up imaging is needed. Musculoskeletal: Thoracic spondylosis with multilevel endplate spurring. Prominent Schmorl's node involves superior endplate of TQ67 Review of the MIP images confirms the above findings. IMPRESSION: 1. No pulmonary embolus. 2. Ill-defined opacity in the medial aspect of the right lower lobe may be atelectasis or pneumonia. 3. Moderate multi chamber cardiomegaly with coronary artery calcifications. 4. Dilated main pulmonary artery suggesting pulmonary arterial hypertension. 5. Mild multifocal mediastinal and hilar adenopathy and shotty lymph nodes, likely reactive. 6. Elevated left hemidiaphragm with adjacent compressive atelectasis in the lingula and left lower lobe. Aortic Atherosclerosis (ICD10-I70.0).  Electronically Signed   By: MKeith RakeM.D.   On: 06/18/2022 16:14   DG Chest Portable 1 View  Result Date: 06/18/2022 CLINICAL DATA:  Shortness of breath. EXAM: PORTABLE CHEST 1 VIEW COMPARISON:  09/13/2020 FINDINGS: There is rotational artifact. Apparent cardiac enlargement may reflect rotational artifact. Pulmonary vascular congestion is noted. Unchanged chronic asymmetric elevation of the left hemidiaphragm with overlying scar versus atelectasis. No signs pleural effusion or frank  interstitial edema. No airspace opacities. IMPRESSION: 1. Cardiac enlargement, increased from the previous exam. 2. Pulmonary vascular congestion. 3. Chronic asymmetric elevation of the left hemidiaphragm with overlying atelectasis or scar. Electronically Signed   By: Kerby Moors M.D.   On: 06/18/2022 12:38    Cardiac Studies     Patient Profile     81 y.o. male with medical history significant of hypertension, hyperlipidemia, CVA, central retinal artery occlusion, prostate caner s/p radiation, progressive dementia and prior tobacco abuse who presented after being found to be hypoxic by facility staff. Normally he was not on oxygen. His ex wife who comes to visit him regularly notes that his memory has been on the decline, but he is able to recognize her and their three children.  She is states he usually does not remember his age and currently cannot tell me why he is here.  He sustained a wound on the right side of his head after being hospitalized for abscess and cellulitis of the scalp last year.  Ever since that time he has been at the [place] care.   Assessment & Plan    Pneumonia/sepsis.  -per primary team  2. Acute on chronic diastlic HF - 73/2202 echo: LVEF 54-27%, indet diastolic, normal RV - CXR cardiomegaly, pulm congestion. BNP 1267 - repeat echo pending  - I/Os incomplete. REceived IV lasix '40mg'$  x 1 yesterday, due for '40mg'$  bid today. Labs pending -ongoing fluid overload.Continue  IV lasix  - consider SLGT2i if echo shows only diastolic dysfunction  3. Elevated troponin - mild and flat in setting of pneumonia/sepsis and diastolic HF. EKG SR, no specific ST/T changes, no chest pains.      For questions or updates, please contact [place] Please consult [email or web address] for contact info under        Signed, Carlyle Dolly, MD  06/19/2022, 12:33 PM

## 2022-06-19 NOTE — Progress Notes (Signed)
Patient placed on CPAP on auto 5cmH2O -15 cmH2O via a nasal mask with 6 L O2 bleed in.  Patient is tolerating at this time.

## 2022-06-19 NOTE — Progress Notes (Signed)
Pt is awake alert and at his baseline. Pt has Nasal mask CPaP with 6 L of O2. With good pleth sats are in the low 80's. Notified RT and she stated since pt hasn't used CPaP before, hasn't had a sleep study, and doesn't have Dx of OSA per protocol pt can only use nasal mask and the most oxygen that can be used is 6L. Dr. Bridgett Larsson paged.

## 2022-06-19 NOTE — Progress Notes (Signed)
PROGRESS NOTE                                                                                                                                                                                                             Patient Demographics:    Tommy Bauer, is a 81 y.o. male, DOB - [birth year], BZJ:[id number]  Outpatient Primary MD for the patient is Garwin Brothers, MD    LOS - 1  Admit date - 06/18/2022    Chief Complaint  Patient presents with   Shortness of Breath       Brief Narrative (HPI from H&P)   81 y.o. male with medical history significant of hypertension, hyperlipidemia, CVA, central retinal artery occlusion, prostate caner s/p radiation, progressive dementia and prior tobacco abuse who presented after being found to be hypoxic by facility staff. Normally he was not on oxygen. His ex wife who comes to visit him regularly notes that his memory has been on the decline, but he is able to recognize her and their three children.  She is states he usually does not remember his age and currently cannot tell me why he is here.  He sustained a wound on the right side of his head after being hospitalized for abscess and cellulitis of the scalp last year.  Ever since that time he has been at the [facility].   His work-up was consistent with pneumonia and CHF, he was seen by cardiology and admitted to the hospital.   Subjective:    Lisette Grinder today has, No headache, No chest pain, No abdominal pain - No Nausea, No new weakness tingling or numbness, improved SOB and cough.   Assessment  & Plan :    Acute hypoxic respiratory failure secondary to pneumonia and acute on chronic diastolic CHF.  He had subjective fevers, leukocytosis, elevated procalcitonin along with CT evidence of possible right lower lobe pneumonia.  His work-up is also consistent with fluid overload and CHF decompensation.  Currently he has been placed on  IV antibiotics, speech therapy to evaluate to rule out ongoing aspiration, feeding assistance and aspiration precautions, diuretics as tolerated.  Continue oxygen supplementation and supportive care.  Encouraged to sit up in chair in the daytime use I-S and flutter valve for pulmonary toiletry.    Acute on chronic diastolic CHF.  Last known  EF 55%.  Repeat echo pending.  Gently diurese, cardiology following and monitor.  Non-ACS pattern elevation of troponin.  Trend is flat likely due to demand mismatch caused by hypoxia from #1 above.  Continue aspirin, low-dose beta-blocker if tolerated by blood pressure and heart rate, continue statin for secondary prevention.  Gradually progressive dementia.  At risk for delirium.  Minimize narcotics and benzodiazepines.  PT, OT.  Post discharge go back to SNF.  Dyslipidemia.  On statin.  Hx of stroke.  Continue combination of aspirin and statin for secondary prevention.  History of prostate cancer.  Patient s/p radiation treatment about 11 years ago now thought to be in remission.  Outpatient follow-up by PCP.  CKD stage III A.  Baseline creatinine around 1.2.  Monitor.      Condition - Extremely Guarded  Family Communication  :  None present  Called ex wife Camie on 06/19/2022    Code Status :  Full  Consults  :  Cards  PUD Prophylaxis : PPI   Procedures  :     TTE  CTA - 1. No pulmonary embolus. 2. Ill-defined opacity in the medial aspect of the right lower lobe may be atelectasis or pneumonia. 3. Moderate multi chamber cardiomegaly with coronary artery calcifications. 4. Dilated main pulmonary artery suggesting pulmonary arterial hypertension. 5. Mild multifocal mediastinal and hilar adenopathy and shotty lymph nodes, likely reactive. 6. Elevated left hemidiaphragm with adjacent compressive atelectasis in the lingula and left lower lobe. Aortic Atherosclerosis.      Disposition Plan  :    Status is: Inpatient  DVT Prophylaxis  :     Place TED hose Start: 06/19/22 0832 enoxaparin (LOVENOX) injection 40 mg Start: 06/18/22 1800    Lab Results  Component Value Date   PLT 182 06/18/2022    Diet :  Diet Order             Diet Heart Room service appropriate? Yes; Fluid consistency: Thin  Diet effective now                    Inpatient Medications  Scheduled Meds:  aspirin EC  81 mg Oral Daily   atorvastatin  40 mg Oral QPM   enoxaparin (LOVENOX) injection  40 mg Subcutaneous O27O   folic acid  1 mg Oral Daily   furosemide  40 mg Intravenous BID   hydrALAZINE  25 mg Oral Q8H   latanoprost  1 drop Both Eyes QHS   potassium chloride  20 mEq Oral BID   simethicone  120 mg Oral BID   sodium chloride flush  3 mL Intravenous Q12H   thiamine  100 mg Oral Daily   Continuous Infusions:  cefTRIAXone (ROCEPHIN)  IV     doxycycline (VIBRAMYCIN) IV 100 mg (06/19/22 0614)   PRN Meds:.acetaminophen **OR** acetaminophen, albuterol  Time Spent in minutes  30   Lala Lund M.D on 06/19/2022 at 10:05 AM  To page go to [email or web address]   [facility] -  Office  [phone]  See all Orders from today for further details    Objective:   Vitals:   06/19/22 0400 06/19/22 0421 06/19/22 0551 06/19/22 0845  BP: 123/72   138/83  Pulse: 60 (!) 59 78 75  Resp: '15 17 17 15  '$ Temp: 98.4 F (36.9 C)   97.8 F (36.6 C)  TempSrc: Oral   Oral  SpO2: 93% 100% 96% 92%  Weight:      Height:  Wt Readings from Last 3 Encounters:  06/18/22 103.7 kg  09/20/21 99.8 kg  02/12/21 102.1 kg     Intake/Output Summary (Last 24 hours) at 06/19/2022 1005 Last data filed at 06/18/2022 2050 Gross per 24 hour  Intake --  Output 30 ml  Net -30 ml     Physical Exam  Awake Alert, No new F.N deficits, Normal affect Stall[place].AT,PERRAL Supple Neck, No JVD,   Symmetrical Chest wall movement, Good air movement bilaterally, +ve rales RRR,No Gallops,Rubs or new Murmurs,  +ve B.Sounds, Abd Soft, No tenderness,   No  Cyanosis, 2+ edema   Data Review:    CBC Recent Labs  Lab 06/18/22 1134 06/18/22 1351 06/18/22 1748  WBC  --  12.9*  --   HGB 17.7* 14.9 16.7  HCT 52.0 49.7 49.0  PLT  --  182  --   MCV  --  91.4  --   MCH  --  27.4  --   MCHC  --  30.0  --   RDW  --  17.4*  --   LYMPHSABS  --  0.3*  --   MONOABS  --  0.5  --   EOSABS  --  0.0  --   BASOSABS  --  0.0  --     Electrolytes Recent Labs  Lab 06/18/22 1134 06/18/22 1351 06/18/22 1748 06/18/22 1826 06/18/22 2008 06/18/22 2011 06/18/22 2225  NA 141 140 141  --   --   --   --   K 4.7 3.9 4.0  --   --   --   --   CL  --  101  --   --   --   --   --   CO2  --  26  --   --   --   --   --   GLUCOSE  --  125*  --   --   --   --   --   BUN  --  13  --   --   --   --   --   CREATININE  --  1.24  --   --   --   --   --   CALCIUM  --  8.9  --   --   --   --   --   MG  --   --   --   --  1.9  --   --   PROCALCITON  --   --   --  6.86  --   --   --   LATICACIDVEN  --   --   --  4.7*  --  4.3*  --   BNP  --  1,267.5*  --   --   --   --  1,915.9*    ------------------------------------------------------------------------------------------------------------------ No results for input(s): "CHOL", "HDL", "LDLCALC", "TRIG", "CHOLHDL", "LDLDIRECT" in the last 72 hours.  No results found for: "HGBA1C"  No results for input(s): "TSH", "T4TOTAL", "T3FREE", "THYROIDAB" in the last 72 hours.  Invalid input(s): "FREET3" ------------------------------------------------------------------------------------------------------------------ ID Labs Recent Labs  Lab 06/18/22 1351 06/18/22 1826 06/18/22 2011  WBC 12.9*  --   --   PLT 182  --   --   PROCALCITON  --  6.86  --   LATICACIDVEN  --  4.7* 4.3*  CREATININE 1.24  --   --    Cardiac Enzymes No results for input(s): "CKMB", "TROPONINI", "MYOGLOBIN" in the last 168 hours.  Invalid input(s): "  CK"  Radiology Reports CT Angio Chest PE W and/or Wo Contrast  Result Date:  06/18/2022 CLINICAL DATA:  Pulmonary embolism (PE) suspected, high prob Shortness of breath.  Lethargy. EXAM: CT ANGIOGRAPHY CHEST WITH CONTRAST TECHNIQUE: Multidetector CT imaging of the chest was performed using the standard protocol during bolus administration of intravenous contrast. Multiplanar CT image reconstructions and MIPs were obtained to evaluate the vascular anatomy. RADIATION DOSE REDUCTION: This exam was performed according to the departmental dose-optimization program which includes automated exposure control, adjustment of the mA and/or kV according to patient size and/or use of iterative reconstruction technique. CONTRAST:  28m OMNIPAQUE IOHEXOL 350 MG/ML SOLN COMPARISON:  Radiograph earlier today. FINDINGS: Cardiovascular: Examination is diagnostic to the level of the segmental pulmonary arteries. The subsegmental branches are not well assessed. Allowing for this limitation, there are no filling defects within the pulmonary arteries to suggest pulmonary embolus. Dilated main pulmonary artery 3.7 cm. Moderate multi chamber cardiomegaly. No pericardial effusion. There are coronary artery calcifications. Atherosclerosis of the thoracic aorta. No aneurysm. Cannot assess for dissection given phase of contrast tailored to pulmonary artery assessment. Mediastinum/Nodes: Mild multifocal mediastinal and hilar adenopathy and shotty lymph nodes. For example AP window node measures 14 mm, series 5, image 43. right hilar node measures 13 mm, series 5, image 60. Left hilar nodes measure up to 13 mm series 5, image 64. The esophagus is decompressed. No visualized thyroid nodule. No bulky axillary adenopathy. Lungs/Pleura: Elevated left hemidiaphragm with adjacent compressive atelectasis in the lingula and left lower lobe. There is also mild dependent atelectasis within both lungs. Ill-defined opacity in the medial aspect of the right lower lobe. No pleural fluid. No features of pulmonary edema. Minimal retained  mucus in the trachea. Imaging obtained in expiration limiting assessment of the trachea and central bronchi. No pulmonary mass. Upper Abdomen: No acute findings. Simple left renal cyst. No follow-up imaging is needed. Musculoskeletal: Thoracic spondylosis with multilevel endplate spurring. Prominent Schmorl's node involves superior endplate of TH41 Review of the MIP images confirms the above findings. IMPRESSION: 1. No pulmonary embolus. 2. Ill-defined opacity in the medial aspect of the right lower lobe may be atelectasis or pneumonia. 3. Moderate multi chamber cardiomegaly with coronary artery calcifications. 4. Dilated main pulmonary artery suggesting pulmonary arterial hypertension. 5. Mild multifocal mediastinal and hilar adenopathy and shotty lymph nodes, likely reactive. 6. Elevated left hemidiaphragm with adjacent compressive atelectasis in the lingula and left lower lobe. Aortic Atherosclerosis (ICD10-I70.0). Electronically Signed   By: MKeith RakeM.D.   On: 06/18/2022 16:14   DG Chest Portable 1 View  Result Date: 06/18/2022 CLINICAL DATA:  Shortness of breath. EXAM: PORTABLE CHEST 1 VIEW COMPARISON:  09/13/2020 FINDINGS: There is rotational artifact. Apparent cardiac enlargement may reflect rotational artifact. Pulmonary vascular congestion is noted. Unchanged chronic asymmetric elevation of the left hemidiaphragm with overlying scar versus atelectasis. No signs pleural effusion or frank interstitial edema. No airspace opacities. IMPRESSION: 1. Cardiac enlargement, increased from the previous exam. 2. Pulmonary vascular congestion. 3. Chronic asymmetric elevation of the left hemidiaphragm with overlying atelectasis or scar. Electronically Signed   By: TKerby MoorsM.D.   On: 06/18/2022 12:38

## 2022-06-19 NOTE — Progress Notes (Signed)
  Echocardiogram 2D Echocardiogram has been performed.  Eartha Inch 06/19/2022, 12:42 PM

## 2022-06-19 NOTE — Plan of Care (Signed)
Pt adm to rm 34 at 1950. Pt awake, alert, and confused. Oriented to name only. Pt joking with staff at times. Around 2130 pt fell asleep and was snoring loudly when I came into rm. Somewhat difficult to stay awake but took meds without difficulty then went back to sleep. Around midnight pt was very lethargic: very difficult to awaken and would open eyes briefly. Snoring louder. Unable to do Neuro exam. Bilat eyes with cataracts. Dr. Bridgett Larsson aware of resp status and Cardiac changes. CPaP ordered for pt but hasn't arrived to the rm yet

## 2022-06-20 DIAGNOSIS — J9601 Acute respiratory failure with hypoxia: Secondary | ICD-10-CM | POA: Diagnosis not present

## 2022-06-20 DIAGNOSIS — J9602 Acute respiratory failure with hypercapnia: Secondary | ICD-10-CM | POA: Diagnosis not present

## 2022-06-20 LAB — CBC WITH DIFFERENTIAL/PLATELET
Abs Immature Granulocytes: 0.03 10*3/uL (ref 0.00–0.07)
Basophils Absolute: 0 10*3/uL (ref 0.0–0.1)
Basophils Relative: 0 %
Eosinophils Absolute: 0.2 10*3/uL (ref 0.0–0.5)
Eosinophils Relative: 2 %
HCT: 51.1 % (ref 39.0–52.0)
Hemoglobin: 15.3 g/dL (ref 13.0–17.0)
Immature Granulocytes: 0 %
Lymphocytes Relative: 7 %
Lymphs Abs: 0.8 10*3/uL (ref 0.7–4.0)
MCH: 27.3 pg (ref 26.0–34.0)
MCHC: 29.9 g/dL — ABNORMAL LOW (ref 30.0–36.0)
MCV: 91.3 fL (ref 80.0–100.0)
Monocytes Absolute: 1 10*3/uL (ref 0.1–1.0)
Monocytes Relative: 10 %
Neutro Abs: 8.1 10*3/uL — ABNORMAL HIGH (ref 1.7–7.7)
Neutrophils Relative %: 81 %
Platelets: 190 10*3/uL (ref 150–400)
RBC: 5.6 MIL/uL (ref 4.22–5.81)
RDW: 17.6 % — ABNORMAL HIGH (ref 11.5–15.5)
WBC: 10.1 10*3/uL (ref 4.0–10.5)
nRBC: 0 % (ref 0.0–0.2)

## 2022-06-20 LAB — COMPREHENSIVE METABOLIC PANEL
ALT: 22 U/L (ref 0–44)
AST: 25 U/L (ref 15–41)
Albumin: 3.6 g/dL (ref 3.5–5.0)
Alkaline Phosphatase: 51 U/L (ref 38–126)
Anion gap: 10 (ref 5–15)
BUN: 17 mg/dL (ref 8–23)
CO2: 33 mmol/L — ABNORMAL HIGH (ref 22–32)
Calcium: 9.1 mg/dL (ref 8.9–10.3)
Chloride: 99 mmol/L (ref 98–111)
Creatinine, Ser: 1.1 mg/dL (ref 0.61–1.24)
GFR, Estimated: >60 mL/min (ref >60)
Glucose, Bld: 91 mg/dL (ref 70–99)
Potassium: 4.2 mmol/L (ref 3.5–5.1)
Sodium: 142 mmol/L (ref 135–145)
Total Bilirubin: 1.3 mg/dL — ABNORMAL HIGH (ref 0.3–1.2)
Total Protein: 6.9 g/dL (ref 6.5–8.1)

## 2022-06-20 LAB — BRAIN NATRIURETIC PEPTIDE: B Natriuretic Peptide: 554.9 pg/mL — ABNORMAL HIGH (ref 0.0–100.0)

## 2022-06-20 LAB — MAGNESIUM: Magnesium: 2.1 mg/dL (ref 1.7–2.4)

## 2022-06-20 MED ORDER — FUROSEMIDE 10 MG/ML IJ SOLN
40.0000 mg | Freq: Two times a day (BID) | INTRAMUSCULAR | Status: AC
  Administered 2022-06-20: 40 mg via INTRAVENOUS
  Filled 2022-06-20: qty 4

## 2022-06-20 MED ORDER — DOXYCYCLINE HYCLATE 100 MG PO TABS
100.0000 mg | ORAL_TABLET | Freq: Two times a day (BID) | ORAL | Status: AC
  Administered 2022-06-20 – 2022-06-22 (×6): 100 mg via ORAL
  Filled 2022-06-20 (×6): qty 1

## 2022-06-20 MED ORDER — POTASSIUM CHLORIDE 20 MEQ PO PACK
20.0000 meq | PACK | Freq: Every day | ORAL | Status: AC
  Administered 2022-06-20: 20 meq via ORAL
  Filled 2022-06-20: qty 1

## 2022-06-20 MED ORDER — EMPAGLIFLOZIN 10 MG PO TABS
10.0000 mg | ORAL_TABLET | Freq: Every day | ORAL | Status: DC
  Administered 2022-06-20: 10 mg via ORAL
  Filled 2022-06-20 (×2): qty 1

## 2022-06-20 MED ORDER — FUROSEMIDE 10 MG/ML IJ SOLN
60.0000 mg | Freq: Once | INTRAMUSCULAR | Status: AC
  Administered 2022-06-20: 60 mg via INTRAVENOUS
  Filled 2022-06-20: qty 6

## 2022-06-20 MED ORDER — CARVEDILOL 3.125 MG PO TABS
3.1250 mg | ORAL_TABLET | Freq: Two times a day (BID) | ORAL | Status: DC
  Administered 2022-06-20 – 2022-06-21 (×2): 3.125 mg via ORAL
  Filled 2022-06-20 (×2): qty 1

## 2022-06-20 MED ORDER — CEPHALEXIN 500 MG PO CAPS
500.0000 mg | ORAL_CAPSULE | Freq: Two times a day (BID) | ORAL | Status: AC
Start: 2022-06-20 — End: 2022-06-23
  Administered 2022-06-20 – 2022-06-23 (×6): 500 mg via ORAL
  Filled 2022-06-20 (×6): qty 1

## 2022-06-20 MED ORDER — SPIRONOLACTONE 25 MG PO TABS
50.0000 mg | ORAL_TABLET | Freq: Once | ORAL | Status: AC
  Administered 2022-06-20: 50 mg via ORAL
  Filled 2022-06-20: qty 2

## 2022-06-20 NOTE — Evaluation (Signed)
Occupational Therapy Evaluation Patient Details Name: Tommy Bauer MRN: [id number] DOB: [birth year] Today's Date: 06/20/2022   History of Present Illness 81 yo male presents from [facility] with ShOB, lethargy due to acute respiratory failure and possible sepsis secondary to RLL PNA. Ptalso with HF exacerbation. PMH includes hypertension, previous CVA, hyperlipidemia, dementia, central retinal occlusion.   Clinical Impression   Pt is a resident of a SNF and ambulates with a RW. He is likely assisted for bathing, dressing and toileting. Pt is a poor historian with baseline dementia. He presents with generalized weakness and decreased standing balance. He requires min assist for all mobility and set up to total assist for ADLs. Recommend return to SNF upon discharge.      Recommendations for follow up therapy are one component of a multi-disciplinary discharge planning process, led by the attending physician.  Recommendations may be updated based on patient status, additional functional criteria and insurance authorization.   Follow Up Recommendations  Skilled nursing-short term rehab (<3 hours/day)    Assistance Recommended at Discharge Frequent or constant Supervision/Assistance  Patient can return home with the following A little help with walking and/or transfers;A lot of help with bathing/dressing/bathroom;Assistance with cooking/housework;Direct supervision/assist for medications management;Direct supervision/assist for financial management;Assist for transportation;Help with stairs or ramp for entrance    Functional Status Assessment  Patient has had a recent decline in their functional status and demonstrates the ability to make significant improvements in function in a reasonable and predictable amount of time.  Equipment Recommendations  None recommended by OT    Recommendations for Other Services       Precautions / Restrictions Precautions Precautions: Fall Precaution  Comments: on 8LO2, VSS Restrictions Weight Bearing Restrictions: No      Mobility Bed Mobility Overal bed mobility: Needs Assistance Bed Mobility: Supine to Sit, Sit to Supine     Supine to sit: Min assist, HOB elevated Sit to supine: Min assist, HOB elevated   General bed mobility comments: assist for LE and truncal management    Transfers Overall transfer level: Needs assistance Equipment used: Rolling Randle (2 wheels) Transfers: Sit to/from Stand, Bed to chair/wheelchair/BSC Sit to Stand: Min assist     Step pivot transfers: Min assist     General transfer comment: assist for rise and steady, lateral stepping towards HOB      Balance Overall balance assessment: Needs assistance Sitting-balance support: Feet supported Sitting balance-Leahy Scale: Fair     Standing balance support: Bilateral upper extremity supported, During functional activity, Reliant on assistive device for balance Standing balance-Leahy Scale: Poor                             ADL either performed or assessed with clinical judgement   ADL Overall ADL's : Needs assistance/impaired Eating/Feeding: Set up;Bed level   Grooming: Supervision/safety;Sitting   Upper Body Bathing: Minimal assistance;Sitting   Lower Body Bathing: Total assistance;Sit to/from stand   Upper Body Dressing : Minimal assistance;Sitting   Lower Body Dressing: Total assistance;Sit to/from stand           Tub/ Banker: Total assistance;Rolling [place] (2 wheels)   Functional mobility during ADLs: Minimal assistance;Rolling [place] (2 wheels)       Vision Ability to See in Adequate Light: 0 Adequate Patient Visual Report: No change from baseline       Perception     Praxis      Pertinent Vitals/Pain Pain Assessment Pain Assessment:  No/denies pain     Hand Dominance Right   Extremity/Trunk Assessment Upper Extremity Assessment Upper Extremity Assessment: Overall WFL for tasks  assessed   Lower Extremity Assessment Lower Extremity Assessment: Defer to PT evaluation   Cervical / Trunk Assessment Cervical / Trunk Assessment: Normal   Communication Communication Communication: No difficulties   Cognition Arousal/Alertness: Awake/alert Behavior During Therapy: WFL for tasks assessed/performed Overall Cognitive Status: History of cognitive impairments - at baseline                                       General Comments       Exercises     Shoulder Instructions      Home Living Family/patient expects to be discharged to:: Skilled nursing facility                                        Prior Functioning/Environment Prior Level of Function : Needs assist;Patient poor historian/Family not available             Mobility Comments: pt states he walks with a [place] ADLs Comments: pt is a poor historian, likely self feeds and assists with grooming and dependent otherwise with ADLs        OT Problem List: Decreased strength;Impaired balance (sitting and/or standing);Decreased knowledge of use of DME or AE;Decreased cognition      OT Treatment/Interventions: Self-care/ADL training;DME and/or AE instruction;Patient/family education;Balance training    OT Goals(Current goals can be found in the care plan section) Acute Rehab OT Goals OT Goal Formulation: With patient Time For Goal Achievement: 07/04/22 Potential to Achieve Goals: Good ADL Goals Pt Will Perform Grooming: with min guard assist;standing Pt Will Perform Upper Body Dressing: with supervision;sitting Pt Will Transfer to Toilet: with min guard assist;ambulating;bedside commode Pt Will Perform Toileting - Clothing Manipulation and hygiene: with min assist;sit to/from stand  OT Frequency: Min 2X/week    Co-evaluation              AM-PAC OT "6 Clicks" Daily Activity     Outcome Measure Help from another person eating meals?: None Help from another  person taking care of personal grooming?: A Little Help from another person toileting, which includes using toliet, bedpan, or urinal?: Total Help from another person bathing (including washing, rinsing, drying)?: A Lot Help from another person to put on and taking off regular upper body clothing?: A Little   6 Click Score: 13   End of Session Equipment Utilized During Treatment: Rolling Jafari (2 wheels);Gait belt;Oxygen (8L)  Activity Tolerance: Patient tolerated treatment well Patient left: in bed;with call bell/phone within reach;with bed alarm set  OT Visit Diagnosis: Unsteadiness on feet (R26.81);Other abnormalities of gait and mobility (R26.89);Muscle weakness (generalized) (M62.81);Other symptoms and signs involving cognitive function                Time: 2409-7353 OT Time Calculation (min): 23 min Charges:  OT General Charges $OT Visit: 1 Visit OT Evaluation $OT Eval Moderate Complexity: [place], OTR/L Acute Rehabilitation Services Office: [phone]  Malka So 06/20/2022, 2:13 PM

## 2022-06-20 NOTE — Progress Notes (Signed)
Pt refused CPAP for tonight.  

## 2022-06-20 NOTE — Progress Notes (Signed)
PROGRESS NOTE                                                                                                                                                                                                             Patient Demographics:    Tommy Bauer, is a 81 y.o. male, DOB - 1941/06/30, IEP:[id number]  Outpatient Primary MD for the patient is Garwin Brothers, MD    LOS - 2  Admit date - 06/18/2022    Chief Complaint  Patient presents with   Shortness of Breath       Brief Narrative (HPI from H&P)   81 y.o. male with medical history significant of hypertension, hyperlipidemia, CVA, central retinal artery occlusion, prostate caner s/p radiation, progressive dementia and prior tobacco abuse who presented after being found to be hypoxic by facility staff. Normally he was not on oxygen. His ex wife who comes to visit him regularly notes that his memory has been on the decline, but he is able to recognize her and their three children.  She is states he usually does not remember his age and currently cannot tell me why he is here.  He sustained a wound on the right side of his head after being hospitalized for abscess and cellulitis of the scalp last year.  Ever since that time he has been at the [facility] care.   His work-up was consistent with pneumonia and CHF, he was seen by cardiology and admitted to the hospital.   Subjective:   Patient in bed, appears comfortable, denies any headache, no fever, no chest pain or pressure, no shortness of breath , no abdominal pain. No focal weakness.   Assessment  & Plan :    Acute hypoxic respiratory failure secondary to pneumonia and acute on chronic diastolic CHF.  He had subjective fevers, leukocytosis, elevated procalcitonin along with CT evidence of possible right lower lobe pneumonia.  His work-up is also consistent with fluid overload and CHF decompensation.  Currently he has  been placed on IV antibiotics, speech therapy to evaluate to rule out ongoing aspiration, feeding assistance and aspiration precautions, diuretics as tolerated.  Continue oxygen supplementation and supportive care.  Encouraged to sit up in chair in the daytime use I-S and flutter valve for pulmonary toiletry.    Acute on chronic combined systolic and diastolic CHF.  Last known EF 55%, EF has not dropped to 35% with wall motion abnormalities, cardiology is following, currently on diuretics, which include Jardiance, Lasix and Aldactone, on hydralazine, heart rate stable we will try and add low-dose Coreg.  Cardiology on board defer further management or work-up to cardiology.   Non-ACS pattern elevation of troponin.  Trend is flat likely due to demand mismatch caused by hypoxia from #1 above.  Continue aspirin, low-dose beta-blocker if tolerated by blood pressure and heart rate, continue statin for secondary prevention.  Gradually progressive dementia.  At risk for delirium.  Minimize narcotics and benzodiazepines.  PT, OT.  Post discharge go back to SNF.  Dyslipidemia.  On statin.  Hx of stroke.  Continue combination of aspirin and statin for secondary prevention.  History of prostate cancer.  Patient s/p radiation treatment about 11 years ago now thought to be in remission.  Outpatient follow-up by PCP.  CKD stage III A.  Baseline creatinine around 1.2.  Monitor.      Condition - Extremely Guarded  Family Communication  :  None present  Called ex wife Camie on 06/19/2022    Code Status :  Full  Consults  :  Cards  PUD Prophylaxis : PPI   Procedures  :     TTE -  1. Left ventricular ejection fraction, by estimation, is 30 to 35%. The left ventricle has moderately decreased function. The left ventricle demonstrates regional wall motion abnormalities (see scoring diagram/findings for description). Indeterminate diastolic filling due to E-A fusion. There is severe hypokinesis of the left  ventricular, entire inferoseptal wall and inferior wall.  2. Right ventricular systolic function is severely reduced. The right ventricular size is severely enlarged. Tricuspid regurgitation signal is inadequate for assessing PA pressure.  3. Left atrial size was mildly dilated.  4. Right atrial size was mildly dilated.  5. The mitral valve is normal in structure. Trivial mitral valve regurgitation.  6. The aortic valve is tricuspid. There is mild calcification of the aortic valve. Aortic valve regurgitation is not visualized. Aortic valve sclerosis/calcification is present, without any evidence of aortic stenosis.  7. The inferior vena cava is dilated in size with <50% respiratory variability, suggesting right atrial pressure of 15 mmHg.  CTA - 1. No pulmonary embolus. 2. Ill-defined opacity in the medial aspect of the right lower lobe may be atelectasis or pneumonia. 3. Moderate multi chamber cardiomegaly with coronary artery calcifications. 4. Dilated main pulmonary artery suggesting pulmonary arterial hypertension. 5. Mild multifocal mediastinal and hilar adenopathy and shotty lymph nodes, likely reactive. 6. Elevated left hemidiaphragm with adjacent compressive atelectasis in the lingula and left lower lobe. Aortic Atherosclerosis.      Disposition Plan  :    Status is: Inpatient  DVT Prophylaxis  :    Place TED hose Start: 06/19/22 0832 enoxaparin (LOVENOX) injection 40 mg Start: 06/18/22 1800    Lab Results  Component Value Date   PLT 190 06/20/2022    Diet :  Diet Order             Diet Heart Room service appropriate? Yes; Fluid consistency: Thin  Diet effective now                    Inpatient Medications  Scheduled Meds:  aspirin EC  81 mg Oral Daily   atorvastatin  40 mg Oral QPM   empagliflozin  10 mg Oral Daily   enoxaparin (LOVENOX) injection  40 mg Subcutaneous Q24H  folic acid  1 mg Oral Daily   furosemide  60 mg Intravenous Once   hydrALAZINE  25 mg Oral  Q8H   latanoprost  1 drop Both Eyes QHS   pantoprazole  40 mg Oral Daily   simethicone  120 mg Oral BID   sodium chloride flush  3 mL Intravenous Q12H   spironolactone  50 mg Oral Once   thiamine  100 mg Oral Daily   Continuous Infusions:  cefTRIAXone (ROCEPHIN)  IV     doxycycline (VIBRAMYCIN) IV 100 mg (06/20/22 0600)   PRN Meds:.acetaminophen **OR** acetaminophen, albuterol  Time Spent in minutes  30   Lala Lund M.D on 06/20/2022 at 11:32 AM  To page go to [email or web address]   [facility] -  Office  [phone]  See all Orders from today for further details    Objective:   Vitals:   06/20/22 0400 06/20/22 0600 06/20/22 0729 06/20/22 0740  BP:  132/80 131/80   Pulse:   83   Resp:   20   Temp: 97.9 F (36.6 C)  98.9 F (37.2 C)   TempSrc: Oral  Oral   SpO2: 93%  (!) 86% (!) 85%  Weight:      Height:        Wt Readings from Last 3 Encounters:  06/18/22 103.7 kg  09/20/21 99.8 kg  02/12/21 102.1 kg     Intake/Output Summary (Last 24 hours) at 06/20/2022 1132 Last data filed at 06/20/2022 0600 Gross per 24 hour  Intake 500 ml  Output 4700 ml  Net -4200 ml     Physical Exam  Awake, pleasantly confused, No new F.N deficits, Normal affect [place].AT,PERRAL Supple Neck, No JVD,   Symmetrical Chest wall movement, Good air movement bilaterally, few bibasilar crackles RRR,No Gallops, Rubs or new Murmurs,  +ve B.Sounds, Abd Soft, No tenderness,   No Cyanosis, 2+ leg edema    Data Review:    CBC Recent Labs  Lab 06/18/22 1134 06/18/22 1351 06/18/22 1748 06/20/22 0253  WBC  --  12.9*  --  10.1  HGB 17.7* 14.9 16.7 15.3  HCT 52.0 49.7 49.0 51.1  PLT  --  182  --  190  MCV  --  91.4  --  91.3  MCH  --  27.4  --  27.3  MCHC  --  30.0  --  29.9*  RDW  --  17.4*  --  17.6*  LYMPHSABS  --  0.3*  --  0.8  MONOABS  --  0.5  --  1.0  EOSABS  --  0.0  --  0.2  BASOSABS  --  0.0  --  0.0    Electrolytes Recent Labs  Lab 06/18/22 1134  06/18/22 1351 06/18/22 1748 06/18/22 1826 06/18/22 2008 06/18/22 2011 06/18/22 2225 06/19/22 0925 06/20/22 0253  NA 141 140 141  --   --   --   --  143 142  K 4.7 3.9 4.0  --   --   --   --  4.9 4.2  CL  --  101  --   --   --   --   --  104 99  CO2  --  26  --   --   --   --   --  30 33*  GLUCOSE  --  125*  --   --   --   --   --  83 91  BUN  --  13  --   --   --   --   --  18 17  CREATININE  --  1.24  --   --   --   --   --  1.14 1.10  CALCIUM  --  8.9  --   --   --   --   --  8.9 9.1  AST  --   --   --   --   --   --   --   --  25  ALT  --   --   --   --   --   --   --   --  22  ALKPHOS  --   --   --   --   --   --   --   --  51  BILITOT  --   --   --   --   --   --   --   --  1.3*  ALBUMIN  --   --   --   --   --   --   --   --  3.6  MG  --   --   --   --  1.9  --   --   --  2.1  PROCALCITON  --   --   --  6.86  --   --   --   --   --   LATICACIDVEN  --   --   --  4.7*  --  4.3*  --   --   --   BNP  --  1,267.5*  --   --   --   --  1,915.9*  --  554.9*    ------------------------------------------------------------------------------------------------------------------ Recent Labs    06/19/22 0925  CHOL 102  HDL 36*  LDLCALC 62  TRIG 22  CHOLHDL 2.8    No results found for: "HGBA1C"  No results for input(s): "TSH", "T4TOTAL", "T3FREE", "THYROIDAB" in the last 72 hours.  Invalid input(s): "FREET3" ------------------------------------------------------------------------------------------------------------------ ID Labs Recent Labs  Lab 06/18/22 1351 06/18/22 1826 06/18/22 2011 06/19/22 0925 06/20/22 0253  WBC 12.9*  --   --   --  10.1  PLT 182  --   --   --  190  PROCALCITON  --  6.86  --   --   --   LATICACIDVEN  --  4.7* 4.3*  --   --   CREATININE 1.24  --   --  1.14 1.10   Cardiac Enzymes No results for input(s): "CKMB", "TROPONINI", "MYOGLOBIN" in the last 168 hours.  Invalid input(s): "CK"  Radiology Reports ECHOCARDIOGRAM COMPLETE  Result Date:  06/19/2022    ECHOCARDIOGRAM REPORT   Patient Name:   NNAEMEKA SAMSON Date of Exam: 06/19/2022 Medical Rec #:  [id number]   Height:       73.0 in Accession #:    [id number]  Weight:       228.6 lb Date of Birth:  Mar 18, 1941   BSA:          2.277 m Patient Age:    85 years    BP:           129/75 mmHg Patient Gender: M           HR:           79 bpm. Exam Location:  Inpatient Procedure: 2D Echo, Cardiac Doppler, Color Doppler and Intracardiac            Opacification Agent Indications:    Cardiomegaly  History:  Patient has prior history of Echocardiogram examinations, most                 recent 09/14/2020. Stroke; Risk Factors:Former Smoker and                 Hypertension. Cancer.  Sonographer:    Eartha Inch Referring Phys: [id number] RONDELL A SMITH  Sonographer Comments: Technically difficult study due to poor echo windows. Image acquisition challenging due to patient body habitus. IMPRESSIONS  1. Left ventricular ejection fraction, by estimation, is 30 to 35%. The left ventricle has moderately decreased function. The left ventricle demonstrates regional wall motion abnormalities (see scoring diagram/findings for description). Indeterminate diastolic filling due to E-A fusion. There is severe hypokinesis of the left ventricular, entire inferoseptal wall and inferior wall.  2. Right ventricular systolic function is severely reduced. The right ventricular size is severely enlarged. Tricuspid regurgitation signal is inadequate for assessing PA pressure.  3. Left atrial size was mildly dilated.  4. Right atrial size was mildly dilated.  5. The mitral valve is normal in structure. Trivial mitral valve regurgitation.  6. The aortic valve is tricuspid. There is mild calcification of the aortic valve. Aortic valve regurgitation is not visualized. Aortic valve sclerosis/calcification is present, without any evidence of aortic stenosis.  7. The inferior vena cava is dilated in size with <50% respiratory variability,  suggesting right atrial pressure of 15 mmHg. FINDINGS  Left Ventricle: Left ventricular ejection fraction, by estimation, is 30 to 35%. The left ventricle has moderately decreased function. The left ventricle demonstrates regional wall motion abnormalities. Severe hypokinesis of the left ventricular, entire  inferoseptal wall and inferior wall. Definity contrast agent was given IV to delineate the left ventricular endocardial borders. The left ventricular internal cavity size was normal in size. There is no left ventricular hypertrophy. Abnormal (paradoxical) septal motion, consistent with left bundle branch block. Indeterminate diastolic filling due to E-A fusion. Right Ventricle: The right ventricular size is severely enlarged. Right vetricular wall thickness was not well visualized. Right ventricular systolic function is severely reduced. Tricuspid regurgitation signal is inadequate for assessing PA pressure. The tricuspid regurgitant velocity is 2.31 m/s, and with an assumed right atrial pressure of 15 mmHg, the estimated right ventricular systolic pressure is 16.0 mmHg. Left Atrium: Left atrial size was mildly dilated. Right Atrium: Right atrial size was mildly dilated. Pericardium: There is no evidence of pericardial effusion. Mitral Valve: The mitral valve is normal in structure. Trivial mitral valve regurgitation. Tricuspid Valve: The tricuspid valve is normal in structure. Tricuspid valve regurgitation is mild. Aortic Valve: The aortic valve is tricuspid. There is mild calcification of the aortic valve. Aortic valve regurgitation is not visualized. Aortic valve sclerosis/calcification is present, without any evidence of aortic stenosis. Pulmonic Valve: The pulmonic valve was not well visualized. Pulmonic valve regurgitation is not visualized. Aorta: The aortic root is normal in size and structure. Venous: The inferior vena cava is dilated in size with less than 50% respiratory variability, suggesting right  atrial pressure of 15 mmHg. IAS/Shunts: The interatrial septum was not well visualized.  LEFT VENTRICLE PLAX 2D LVIDd:         5.10 cm   Diastology LVIDs:         4.20 cm   LV e' medial:    8.03 cm/s LV PW:         1.00 cm   LV E/e' medial:  14.1 LV IVS:        0.80 cm  LV e' lateral:   15.60 cm/s LVOT diam:     2.60 cm   LV E/e' lateral: 7.2 LV SV:         66 LV SV Index:   29 LVOT Area:     5.31 cm  RIGHT VENTRICLE            IVC RV S prime:     4.95 cm/s  IVC diam: 2.40 cm TAPSE (M-mode): 1.1 cm LEFT ATRIUM             Index        RIGHT ATRIUM           Index LA diam:        4.00 cm 1.76 cm/m   RA Area:     22.60 cm LA Vol (A2C):   45.8 ml 20.11 ml/m  RA Volume:   80.00 ml  35.13 ml/m LA Vol (A4C):   42.8 ml 18.79 ml/m LA Biplane Vol: 44.6 ml 19.58 ml/m  AORTIC VALVE LVOT Vmax:   77.50 cm/s LVOT Vmean:  59.800 cm/s LVOT VTI:    0.125 m  AORTA Ao Root diam: 3.50 cm MITRAL VALVE                TRICUSPID VALVE MV Area (PHT): 4.36 cm     TR Peak grad:   21.3 mmHg MV Decel Time: 174 msec     TR Vmax:        231.00 cm/s MV E velocity: 113.00 cm/s                             SHUNTS                             Systemic VTI:  0.12 m                             Systemic Diam: 2.60 cm Dani Gobble Croitoru MD Electronically signed by Sanda Klein MD Signature Date/Time: 06/19/2022/2:14:58 PM    Final    CT Angio Chest PE W and/or Wo Contrast  Result Date: 06/18/2022 CLINICAL DATA:  Pulmonary embolism (PE) suspected, high prob Shortness of breath.  Lethargy. EXAM: CT ANGIOGRAPHY CHEST WITH CONTRAST TECHNIQUE: Multidetector CT imaging of the chest was performed using the standard protocol during bolus administration of intravenous contrast. Multiplanar CT image reconstructions and MIPs were obtained to evaluate the vascular anatomy. RADIATION DOSE REDUCTION: This exam was performed according to the departmental dose-optimization program which includes automated exposure control, adjustment of the mA and/or kV  according to patient size and/or use of iterative reconstruction technique. CONTRAST:  68m OMNIPAQUE IOHEXOL 350 MG/ML SOLN COMPARISON:  Radiograph earlier today. FINDINGS: Cardiovascular: Examination is diagnostic to the level of the segmental pulmonary arteries. The subsegmental branches are not well assessed. Allowing for this limitation, there are no filling defects within the pulmonary arteries to suggest pulmonary embolus. Dilated main pulmonary artery 3.7 cm. Moderate multi chamber cardiomegaly. No pericardial effusion. There are coronary artery calcifications. Atherosclerosis of the thoracic aorta. No aneurysm. Cannot assess for dissection given phase of contrast tailored to pulmonary artery assessment. Mediastinum/Nodes: Mild multifocal mediastinal and hilar adenopathy and shotty lymph nodes. For example AP window node measures 14 mm, series 5, image 43. right hilar node measures 13 mm, series 5, image 60. Left hilar nodes measure up to  13 mm series 5, image 64. The esophagus is decompressed. No visualized thyroid nodule. No bulky axillary adenopathy. Lungs/Pleura: Elevated left hemidiaphragm with adjacent compressive atelectasis in the lingula and left lower lobe. There is also mild dependent atelectasis within both lungs. Ill-defined opacity in the medial aspect of the right lower lobe. No pleural fluid. No features of pulmonary edema. Minimal retained mucus in the trachea. Imaging obtained in expiration limiting assessment of the trachea and central bronchi. No pulmonary mass. Upper Abdomen: No acute findings. Simple left renal cyst. No follow-up imaging is needed. Musculoskeletal: Thoracic spondylosis with multilevel endplate spurring. Prominent Schmorl's node involves superior endplate of Z76. Review of the MIP images confirms the above findings. IMPRESSION: 1. No pulmonary embolus. 2. Ill-defined opacity in the medial aspect of the right lower lobe may be atelectasis or pneumonia. 3. Moderate multi  chamber cardiomegaly with coronary artery calcifications. 4. Dilated main pulmonary artery suggesting pulmonary arterial hypertension. 5. Mild multifocal mediastinal and hilar adenopathy and shotty lymph nodes, likely reactive. 6. Elevated left hemidiaphragm with adjacent compressive atelectasis in the lingula and left lower lobe. Aortic Atherosclerosis (ICD10-I70.0). Electronically Signed   By: Keith Rake M.D.   On: 06/18/2022 16:14   DG Chest Portable 1 View  Result Date: 06/18/2022 CLINICAL DATA:  Shortness of breath. EXAM: PORTABLE CHEST 1 VIEW COMPARISON:  09/13/2020 FINDINGS: There is rotational artifact. Apparent cardiac enlargement may reflect rotational artifact. Pulmonary vascular congestion is noted. Unchanged chronic asymmetric elevation of the left hemidiaphragm with overlying scar versus atelectasis. No signs pleural effusion or frank interstitial edema. No airspace opacities. IMPRESSION: 1. Cardiac enlargement, increased from the previous exam. 2. Pulmonary vascular congestion. 3. Chronic asymmetric elevation of the left hemidiaphragm with overlying atelectasis or scar. Electronically Signed   By: Kerby Moors M.D.   On: 06/18/2022 12:38

## 2022-06-20 NOTE — Evaluation (Signed)
Physical Therapy Evaluation Patient Details Name: Tommy Bauer MRN: [id number] DOB: [birth year] Today's Date: 06/20/2022  History of Present Illness  81 yo male presents from [facility] with ShOB, lethargy due to acute respiratory failure and possible sepsis secondary to RLL PNA. Ptalso with HF exacerbation. PMH includes hypertension, previous CVA, hyperlipidemia, dementia, central retinal occlusion.  Clinical Impression   Pt presents with generalized weakness, impaired balance with pt-expressed fear of falling during session, and impaired activity tolerance. Pt to benefit from acute PT to address deficits. Pt tolerated EOB stepping only, overall requiring light physical assist for mobility. PT recommend return to SNF level of care post-acutely. PT to progress mobility as tolerated, and will continue to follow acutely.         Recommendations for follow up therapy are one component of a multi-disciplinary discharge planning process, led by the attending physician.  Recommendations may be updated based on patient status, additional functional criteria and insurance authorization.  Follow Up Recommendations Skilled nursing-short term rehab (<3 hours/day) Can patient physically be transported by private vehicle: Yes    Assistance Recommended at Discharge Frequent or constant Supervision/Assistance  Patient can return home with the following  A little help with walking and/or transfers;A little help with bathing/dressing/bathroom    Equipment Recommendations None recommended by PT  Recommendations for Other Services       Functional Status Assessment Patient has had a recent decline in their functional status and demonstrates the ability to make significant improvements in function in a reasonable and predictable amount of time.     Precautions / Restrictions Precautions Precautions: Fall Precaution Comments: on 8LO2, VSS Restrictions Weight Bearing Restrictions: No       Mobility  Bed Mobility Overal bed mobility: Needs Assistance Bed Mobility: Supine to Sit, Sit to Supine     Supine to sit: Min assist, HOB elevated Sit to supine: Min assist, HOB elevated   General bed mobility comments: assist for LE and truncal management    Transfers Overall transfer level: Needs assistance Equipment used: Rolling Level (2 wheels) Transfers: Sit to/from Stand, Bed to chair/wheelchair/BSC Sit to Stand: Min assist   Step pivot transfers: Min assist       General transfer comment: assist for rise and steady, lateral stepping towards [facility]    Ambulation/Gait               General Gait Details: pt declines  Stairs            Wheelchair Mobility    Modified Rankin (Stroke Patients Only)       Balance Overall balance assessment: Needs assistance Sitting-balance support: Feet supported Sitting balance-Leahy Scale: Fair     Standing balance support: Bilateral upper extremity supported, During functional activity, Reliant on assistive device for balance Standing balance-Leahy Scale: Poor                               Pertinent Vitals/Pain Pain Assessment Pain Assessment: No/denies pain Faces Pain Scale: No hurt Pain Intervention(s): Monitored during session    Home Living Family/patient expects to be discharged to:: Skilled nursing facility                        Prior Function Prior Level of Function : Needs assist;Patient poor historian/Family not available             Mobility Comments: pt states he walks with a [place] ADLs  Comments: pt reports no assist for dressing and bathing, but then when PT asked pt to don his socks pt states "someone does that for me", pt likely needs help for all ADLs     Hand Dominance   Dominant Hand: Right    Extremity/Trunk Assessment   Upper Extremity Assessment Upper Extremity Assessment: Defer to OT evaluation    Lower Extremity Assessment Lower Extremity  Assessment: Generalized weakness    Cervical / Trunk Assessment Cervical / Trunk Assessment: Normal  Communication   Communication: No difficulties  Cognition Arousal/Alertness: Awake/alert Behavior During Therapy: WFL for tasks assessed/performed Overall Cognitive Status: History of cognitive impairments - at baseline                                 General Comments: history of dementia, oriented to self but not time, place, or situation. Pt with no recall of where he is after PT tells him x5 minutes later. Pt follows one-step commands with frequent prompting, easily distracted        General Comments      Exercises     Assessment/Plan    PT Assessment Patient needs continued PT services  PT Problem List Decreased strength;Decreased mobility;Decreased safety awareness;Decreased activity tolerance;Decreased cognition;Decreased knowledge of use of DME;Decreased balance;Cardiopulmonary status limiting activity;Pain       PT Treatment Interventions DME instruction;Therapeutic activities;Gait training;Patient/family education;Balance training;Therapeutic exercise;Functional mobility training    PT Goals (Current goals can be found in the Care Plan section)  Acute Rehab PT Goals PT Goal Formulation: Patient unable to participate in goal setting Time For Goal Achievement: 07/04/22 Potential to Achieve Goals: Good    Frequency Min 2X/week     Co-evaluation               AM-PAC PT "6 Clicks" Mobility  Outcome Measure Help needed turning from your back to your side while in a flat bed without using bedrails?: A Little Help needed moving from lying on your back to sitting on the side of a flat bed without using bedrails?: A Little Help needed moving to and from a bed to a chair (including a wheelchair)?: A Little Help needed standing up from a chair using your arms (e.g., wheelchair or bedside chair)?: A Little Help needed to walk in hospital room?: A  Lot Help needed climbing 3-5 steps with a railing? : A Lot 6 Click Score: 16    End of Session Equipment Utilized During Treatment: Oxygen Activity Tolerance: Patient tolerated treatment well;Patient limited by fatigue Patient left: in bed;with call bell/phone within reach;with bed alarm set Nurse Communication: Mobility status PT Visit Diagnosis: Unsteadiness on feet (R26.81);Muscle weakness (generalized) (M62.81)    Time: 4580-9983 PT Time Calculation (min) (ACUTE ONLY): 23 min   Charges:   PT Evaluation $PT Eval Low Complexity: 1 Low        Jancy Sprankle S, PT DPT Acute Rehabilitation Services Pager [phone]  Office [phone]   Virginia E Ruffin Pyo 06/20/2022, 1:22 PM

## 2022-06-20 NOTE — Progress Notes (Signed)
Progress Note  Patient Name: Tommy Bauer Date of Encounter: 06/20/2022  [facility] Cardiologist: New to [employer]  Subjective   He has no CP, SOB or Palpitations. He feels back to normal. He notes that no one else help him make medical decisions but that his ex-wife checks in on him.  Inpatient Medications    Scheduled Meds:  aspirin EC  81 mg Oral Daily   atorvastatin  40 mg Oral QPM   enoxaparin (LOVENOX) injection  40 mg Subcutaneous M19Q   folic acid  1 mg Oral Daily   furosemide  40 mg Intravenous BID   hydrALAZINE  25 mg Oral Q8H   latanoprost  1 drop Both Eyes QHS   pantoprazole  40 mg Oral Daily   potassium chloride  20 mEq Oral Daily   simethicone  120 mg Oral BID   sodium chloride flush  3 mL Intravenous Q12H   thiamine  100 mg Oral Daily   Continuous Infusions:  cefTRIAXone (ROCEPHIN)  IV     doxycycline (VIBRAMYCIN) IV 100 mg (06/20/22 0600)   PRN Meds: acetaminophen **OR** acetaminophen, albuterol   Vital Signs    Vitals:   06/19/22 2352 06/20/22 0400 06/20/22 0600 06/20/22 0729  BP: (!) 137/91  132/80 131/80  Pulse: 80   83  Resp: 16   20  Temp:  97.9 F (36.6 C)  98.9 F (37.2 C)  TempSrc:  Oral  Oral  SpO2: 97% 93%  (!) 86%  Weight:      Height:        Intake/Output Summary (Last 24 hours) at 06/20/2022 0740 Last data filed at 06/20/2022 0600 Gross per 24 hour  Intake 500 ml  Output 4700 ml  Net -4200 ml      06/18/2022    7:50 PM 06/18/2022   11:00 AM 09/20/2021   10:23 AM  Last 3 Weights  Weight (lbs) 228 lb 9.9 oz 220 lb 0.3 oz 220 lb  Weight (kg) 103.7 kg 99.8 kg 99.791 kg      Telemetry    SR, 1st degree av block - Personally Reviewed  Physical Exam   GEN: No acute distress.   Neck: + JVD at 45 degrees Cardiac: RRR, no murmurs, rubs, or gallops.  Respiratory: Crackles bilaterally GI: Soft, nontender, non-distended  MS: 2+ bilaterally LE edema, skin is warm Neuro:  Oriented to person and place  Psych: Normal  affect   Labs    High Sensitivity Troponin:   Recent Labs  Lab 06/18/22 1121 06/18/22 1302  TROPONINIHS 133* 134*     Chemistry Recent Labs  Lab 06/18/22 1351 06/18/22 1748 06/18/22 2008 06/19/22 0925 06/20/22 0253  NA 140 141  --  143 142  K 3.9 4.0  --  4.9 4.2  CL 101  --   --  104 99  CO2 26  --   --  30 33*  GLUCOSE 125*  --   --  83 91  BUN 13  --   --  18 17  CREATININE 1.24  --   --  1.14 1.10  CALCIUM 8.9  --   --  8.9 9.1  MG  --   --  1.9  --  2.1  PROT  --   --   --   --  6.9  ALBUMIN  --   --   --   --  3.6  AST  --   --   --   --  25  ALT  --   --   --   --  22  ALKPHOS  --   --   --   --  51  BILITOT  --   --   --   --  1.3*  GFRNONAA 58*  --   --  >60 >60  ANIONGAP 13  --   --  9 10    Lipids  Recent Labs  Lab 06/19/22 0925  CHOL 102  TRIG 22  HDL 36*  LDLCALC 62  CHOLHDL 2.8    Hematology Recent Labs  Lab 06/18/22 1351 06/18/22 1748 06/20/22 0253  WBC 12.9*  --  10.1  RBC 5.44  --  5.60  HGB 14.9 16.7 15.3  HCT 49.7 49.0 51.1  MCV 91.4  --  91.3  MCH 27.4  --  27.3  MCHC 30.0  --  29.9*  RDW 17.4*  --  17.6*  PLT 182  --  190   Thyroid No results for input(s): "TSH", "FREET4" in the last 168 hours.  BNP Recent Labs  Lab 06/18/22 1351 06/18/22 2225 06/20/22 0253  BNP 1,267.5* 1,915.9* 554.9*    DDimer No results for input(s): "DDIMER" in the last 168 hours.   Radiology    ECHOCARDIOGRAM COMPLETE  Result Date: 06/19/2022    ECHOCARDIOGRAM REPORT   Patient Name:   Tommy Bauer Date of Exam: 06/19/2022 Medical Rec #:  [id number]   Height:       73.0 in Accession #:    [id number]  Weight:       228.6 lb Date of Birth:  1941-06-21   BSA:          2.277 m Patient Age:    89 years    BP:           129/75 mmHg Patient Gender: M           HR:           79 bpm. Exam Location:  Inpatient Procedure: 2D Echo, Cardiac Doppler, Color Doppler and Intracardiac            Opacification Agent Indications:    Cardiomegaly  History:        Patient  has prior history of Echocardiogram examinations, most                 recent 09/14/2020. Stroke; Risk Factors:Former Smoker and                 Hypertension. Cancer.  Sonographer:    Eartha Inch Referring Phys: [id number] RONDELL A SMITH  Sonographer Comments: Technically difficult study due to poor echo windows. Image acquisition challenging due to patient body habitus. IMPRESSIONS  1. Left ventricular ejection fraction, by estimation, is 30 to 35%. The left ventricle has moderately decreased function. The left ventricle demonstrates regional wall motion abnormalities (see scoring diagram/findings for description). Indeterminate diastolic filling due to E-A fusion. There is severe hypokinesis of the left ventricular, entire inferoseptal wall and inferior wall.  2. Right ventricular systolic function is severely reduced. The right ventricular size is severely enlarged. Tricuspid regurgitation signal is inadequate for assessing PA pressure.  3. Left atrial size was mildly dilated.  4. Right atrial size was mildly dilated.  5. The mitral valve is normal in structure. Trivial mitral valve regurgitation.  6. The aortic valve is tricuspid. There is mild calcification of the aortic valve. Aortic valve regurgitation is not visualized. Aortic valve sclerosis/calcification is present, without any evidence  of aortic stenosis.  7. The inferior vena cava is dilated in size with <50% respiratory variability, suggesting right atrial pressure of 15 mmHg. FINDINGS  Left Ventricle: Left ventricular ejection fraction, by estimation, is 30 to 35%. The left ventricle has moderately decreased function. The left ventricle demonstrates regional wall motion abnormalities. Severe hypokinesis of the left ventricular, entire  inferoseptal wall and inferior wall. Definity contrast agent was given IV to delineate the left ventricular endocardial borders. The left ventricular internal cavity size was normal in size. There is no left  ventricular hypertrophy. Abnormal (paradoxical) septal motion, consistent with left bundle branch block. Indeterminate diastolic filling due to E-A fusion. Right Ventricle: The right ventricular size is severely enlarged. Right vetricular wall thickness was not well visualized. Right ventricular systolic function is severely reduced. Tricuspid regurgitation signal is inadequate for assessing PA pressure. The tricuspid regurgitant velocity is 2.31 m/s, and with an assumed right atrial pressure of 15 mmHg, the estimated right ventricular systolic pressure is 49.7 mmHg. Left Atrium: Left atrial size was mildly dilated. Right Atrium: Right atrial size was mildly dilated. Pericardium: There is no evidence of pericardial effusion. Mitral Valve: The mitral valve is normal in structure. Trivial mitral valve regurgitation. Tricuspid Valve: The tricuspid valve is normal in structure. Tricuspid valve regurgitation is mild. Aortic Valve: The aortic valve is tricuspid. There is mild calcification of the aortic valve. Aortic valve regurgitation is not visualized. Aortic valve sclerosis/calcification is present, without any evidence of aortic stenosis. Pulmonic Valve: The pulmonic valve was not well visualized. Pulmonic valve regurgitation is not visualized. Aorta: The aortic root is normal in size and structure. Venous: The inferior vena cava is dilated in size with less than 50% respiratory variability, suggesting right atrial pressure of 15 mmHg. IAS/Shunts: The interatrial septum was not well visualized.  LEFT VENTRICLE PLAX 2D LVIDd:         5.10 cm   Diastology LVIDs:         4.20 cm   LV e' medial:    8.03 cm/s LV PW:         1.00 cm   LV E/e' medial:  14.1 LV IVS:        0.80 cm   LV e' lateral:   15.60 cm/s LVOT diam:     2.60 cm   LV E/e' lateral: 7.2 LV SV:         66 LV SV Index:   29 LVOT Area:     5.31 cm  RIGHT VENTRICLE            IVC RV S prime:     4.95 cm/s  IVC diam: 2.40 cm TAPSE (M-mode): 1.1 cm LEFT ATRIUM              Index        RIGHT ATRIUM           Index LA diam:        4.00 cm 1.76 cm/m   RA Area:     22.60 cm LA Vol (A2C):   45.8 ml 20.11 ml/m  RA Volume:   80.00 ml  35.13 ml/m LA Vol (A4C):   42.8 ml 18.79 ml/m LA Biplane Vol: 44.6 ml 19.58 ml/m  AORTIC VALVE LVOT Vmax:   77.50 cm/s LVOT Vmean:  59.800 cm/s LVOT VTI:    0.125 m  AORTA Ao Root diam: 3.50 cm MITRAL VALVE  TRICUSPID VALVE MV Area (PHT): 4.36 cm     TR Peak grad:   21.3 mmHg MV Decel Time: 174 msec     TR Vmax:        231.00 cm/s MV E velocity: 113.00 cm/s                             SHUNTS                             Systemic VTI:  0.12 m                             Systemic Diam: 2.60 cm Dani Gobble Croitoru MD Electronically signed by Sanda Klein MD Signature Date/Time: 06/19/2022/2:14:58 PM    Final    CT Angio Chest PE W and/or Wo Contrast  Result Date: 06/18/2022 CLINICAL DATA:  Pulmonary embolism (PE) suspected, high prob Shortness of breath.  Lethargy. EXAM: CT ANGIOGRAPHY CHEST WITH CONTRAST TECHNIQUE: Multidetector CT imaging of the chest was performed using the standard protocol during bolus administration of intravenous contrast. Multiplanar CT image reconstructions and MIPs were obtained to evaluate the vascular anatomy. RADIATION DOSE REDUCTION: This exam was performed according to the departmental dose-optimization program which includes automated exposure control, adjustment of the mA and/or kV according to patient size and/or use of iterative reconstruction technique. CONTRAST:  22m OMNIPAQUE IOHEXOL 350 MG/ML SOLN COMPARISON:  Radiograph earlier today. FINDINGS: Cardiovascular: Examination is diagnostic to the level of the segmental pulmonary arteries. The subsegmental branches are not well assessed. Allowing for this limitation, there are no filling defects within the pulmonary arteries to suggest pulmonary embolus. Dilated main pulmonary artery 3.7 cm. Moderate multi chamber cardiomegaly. No pericardial  effusion. There are coronary artery calcifications. Atherosclerosis of the thoracic aorta. No aneurysm. Cannot assess for dissection given phase of contrast tailored to pulmonary artery assessment. Mediastinum/Nodes: Mild multifocal mediastinal and hilar adenopathy and shotty lymph nodes. For example AP window node measures 14 mm, series 5, image 43. right hilar node measures 13 mm, series 5, image 60. Left hilar nodes measure up to 13 mm series 5, image 64. The esophagus is decompressed. No visualized thyroid nodule. No bulky axillary adenopathy. Lungs/Pleura: Elevated left hemidiaphragm with adjacent compressive atelectasis in the lingula and left lower lobe. There is also mild dependent atelectasis within both lungs. Ill-defined opacity in the medial aspect of the right lower lobe. No pleural fluid. No features of pulmonary edema. Minimal retained mucus in the trachea. Imaging obtained in expiration limiting assessment of the trachea and central bronchi. No pulmonary mass. Upper Abdomen: No acute findings. Simple left renal cyst. No follow-up imaging is needed. Musculoskeletal: Thoracic spondylosis with multilevel endplate spurring. Prominent Schmorl's node involves superior endplate of TD66 Review of the MIP images confirms the above findings. IMPRESSION: 1. No pulmonary embolus. 2. Ill-defined opacity in the medial aspect of the right lower lobe may be atelectasis or pneumonia. 3. Moderate multi chamber cardiomegaly with coronary artery calcifications. 4. Dilated main pulmonary artery suggesting pulmonary arterial hypertension. 5. Mild multifocal mediastinal and hilar adenopathy and shotty lymph nodes, likely reactive. 6. Elevated left hemidiaphragm with adjacent compressive atelectasis in the lingula and left lower lobe. Aortic Atherosclerosis (ICD10-I70.0). Electronically Signed   By: MKeith RakeM.D.   On: 06/18/2022 16:14   DG Chest Portable 1 View  Result Date: 06/18/2022 CLINICAL  DATA:  Shortness  of breath. EXAM: PORTABLE CHEST 1 VIEW COMPARISON:  09/13/2020 FINDINGS: There is rotational artifact. Apparent cardiac enlargement may reflect rotational artifact. Pulmonary vascular congestion is noted. Unchanged chronic asymmetric elevation of the left hemidiaphragm with overlying scar versus atelectasis. No signs pleural effusion or frank interstitial edema. No airspace opacities. IMPRESSION: 1. Cardiac enlargement, increased from the previous exam. 2. Pulmonary vascular congestion. 3. Chronic asymmetric elevation of the left hemidiaphragm with overlying atelectasis or scar. Electronically Signed   By: Kerby Moors M.D.   On: 06/18/2022 12:38    Cardiac Studies     Patient Profile     81 y.o. male with medical history significant of hypertension, hyperlipidemia, CVA, central retinal artery occlusion, prostate caner s/p radiation, progressive dementia and prior tobacco abuse who presented after being found to be hypoxic by facility staff. Normally he was not on oxygen. His ex wife who comes to visit him regularly notes that his memory has been on the decline, but he is able to recognize her and their three children.  She is states he usually does not remember his age.   Assessment & Plan     Heart Failure with Reduced Ejection Fraction (biventricular with ischemic changes) Dementia Sepsis and PNA HTN and HLD -Acute on chronic with mixed picture hypoxic respiratory failure;  - attempting to reach out to family to discuss results of testing (he has given my permission to call his ex wife) - given his dementia, despite having ischemic changes, given his lack of symptoms medical therapy is likely appropriate - PNA therapy as per primary - Continue lasix 40 IV BID - adding SGLT2i today - will add BB last given new HF and 1st HB; may add this as outpatient - will attempt to add MRA or ARN tomorrow based on pressure - goal will be with PNA and HF therapy to wean off O2 - elevated troponin  likely demand but he likely has underlying CAD      For questions or updates, please contact [facility] Please consult [email or web address] for contact info under        Signed, Werner Lean, MD  06/20/2022, 7:40 AM

## 2022-06-21 ENCOUNTER — Inpatient Hospital Stay (HOSPITAL_COMMUNITY): Payer: Medicare Other

## 2022-06-21 DIAGNOSIS — J9601 Acute respiratory failure with hypoxia: Secondary | ICD-10-CM | POA: Diagnosis not present

## 2022-06-21 DIAGNOSIS — J9602 Acute respiratory failure with hypercapnia: Secondary | ICD-10-CM | POA: Diagnosis not present

## 2022-06-21 LAB — CBC WITH DIFFERENTIAL/PLATELET
Abs Immature Granulocytes: 0.02 10*3/uL (ref 0.00–0.07)
Basophils Absolute: 0 10*3/uL (ref 0.0–0.1)
Basophils Relative: 0 %
Eosinophils Absolute: 0.3 10*3/uL (ref 0.0–0.5)
Eosinophils Relative: 4 %
HCT: 49.1 % (ref 39.0–52.0)
Hemoglobin: 14.9 g/dL (ref 13.0–17.0)
Immature Granulocytes: 0 %
Lymphocytes Relative: 13 %
Lymphs Abs: 0.9 10*3/uL (ref 0.7–4.0)
MCH: 27.7 pg (ref 26.0–34.0)
MCHC: 30.3 g/dL (ref 30.0–36.0)
MCV: 91.4 fL (ref 80.0–100.0)
Monocytes Absolute: 1 10*3/uL (ref 0.1–1.0)
Monocytes Relative: 14 %
Neutro Abs: 4.8 10*3/uL (ref 1.7–7.7)
Neutrophils Relative %: 69 %
Platelets: 175 10*3/uL (ref 150–400)
RBC: 5.37 MIL/uL (ref 4.22–5.81)
RDW: 16.9 % — ABNORMAL HIGH (ref 11.5–15.5)
WBC: 7 10*3/uL (ref 4.0–10.5)
nRBC: 0 % (ref 0.0–0.2)

## 2022-06-21 LAB — COMPREHENSIVE METABOLIC PANEL
ALT: 19 U/L (ref 0–44)
AST: 19 U/L (ref 15–41)
Albumin: 3.3 g/dL — ABNORMAL LOW (ref 3.5–5.0)
Alkaline Phosphatase: 47 U/L (ref 38–126)
Anion gap: 9 (ref 5–15)
BUN: 19 mg/dL (ref 8–23)
CO2: 38 mmol/L — ABNORMAL HIGH (ref 22–32)
Calcium: 9.2 mg/dL (ref 8.9–10.3)
Chloride: 95 mmol/L — ABNORMAL LOW (ref 98–111)
Creatinine, Ser: 1.53 mg/dL — ABNORMAL HIGH (ref 0.61–1.24)
GFR, Estimated: 45 mL/min — ABNORMAL LOW (ref >60)
Glucose, Bld: 89 mg/dL (ref 70–99)
Potassium: 4.4 mmol/L (ref 3.5–5.1)
Sodium: 142 mmol/L (ref 135–145)
Total Bilirubin: 1.3 mg/dL — ABNORMAL HIGH (ref 0.3–1.2)
Total Protein: 6.7 g/dL (ref 6.5–8.1)

## 2022-06-21 LAB — URINALYSIS, ROUTINE W REFLEX MICROSCOPIC
Bacteria, UA: NONE SEEN
Bilirubin Urine: NEGATIVE
Glucose, UA: >=500 mg/dL — AB
Hgb urine dipstick: NEGATIVE
Ketones, ur: NEGATIVE mg/dL
Leukocytes,Ua: NEGATIVE
Nitrite: NEGATIVE
Protein, ur: NEGATIVE mg/dL
Specific Gravity, Urine: 1.016 (ref 1.005–1.030)
pH: 7 (ref 5.0–8.0)

## 2022-06-21 LAB — URIC ACID: Uric Acid, Serum: 8 mg/dL (ref 3.7–8.6)

## 2022-06-21 LAB — CREATININE, URINE, RANDOM: Creatinine, Urine: 103 mg/dL

## 2022-06-21 LAB — BRAIN NATRIURETIC PEPTIDE: B Natriuretic Peptide: 406.8 pg/mL — ABNORMAL HIGH (ref 0.0–100.0)

## 2022-06-21 LAB — SODIUM, URINE, RANDOM: Sodium, Ur: 117 mmol/L

## 2022-06-21 LAB — OSMOLALITY, URINE: Osmolality, Ur: 615 mOsm/kg (ref 300–900)

## 2022-06-21 LAB — LEGIONELLA PNEUMOPHILA SEROGP 1 UR AG: L. pneumophila Serogp 1 Ur Ag: NEGATIVE

## 2022-06-21 LAB — OSMOLALITY: Osmolality: 301 mOsm/kg — ABNORMAL HIGH (ref 275–295)

## 2022-06-21 LAB — MAGNESIUM: Magnesium: 2.1 mg/dL (ref 1.7–2.4)

## 2022-06-21 MED ORDER — CARVEDILOL 6.25 MG PO TABS
6.2500 mg | ORAL_TABLET | Freq: Two times a day (BID) | ORAL | Status: DC
  Administered 2022-06-21 – 2022-06-22 (×2): 6.25 mg via ORAL
  Filled 2022-06-21 (×2): qty 1

## 2022-06-21 MED ORDER — LACTATED RINGERS IV SOLN: INTRAVENOUS | Status: AC

## 2022-06-21 MED ORDER — EMPAGLIFLOZIN 10 MG PO TABS
10.0000 mg | ORAL_TABLET | Freq: Every day | ORAL | Status: DC
  Administered 2022-06-22 – 2022-06-28 (×7): 10 mg via ORAL
  Filled 2022-06-21 (×7): qty 1

## 2022-06-21 MED ORDER — ISOSORBIDE MONONITRATE ER 30 MG PO TB24
15.0000 mg | ORAL_TABLET | Freq: Every day | ORAL | Status: DC
  Administered 2022-06-21 – 2022-06-25 (×5): 15 mg via ORAL
  Filled 2022-06-21 (×6): qty 1

## 2022-06-21 NOTE — Progress Notes (Addendum)
PROGRESS NOTE                                                                                                                                                                                                             Patient Demographics:    Shariff Lasky, is a 81 y.o. male, DOB - [birth year], MOQ:[id number]  Outpatient Primary MD for the patient is Garwin Brothers, MD    LOS - 3  Admit date - 06/18/2022    Chief Complaint  Patient presents with   Shortness of Breath       Brief Narrative (HPI from H&P)   81 y.o. male with medical history significant of hypertension, hyperlipidemia, CVA, central retinal artery occlusion, prostate caner s/p radiation, progressive dementia and prior tobacco abuse who presented after being found to be hypoxic by facility staff. Normally he was not on oxygen. His ex wife who comes to visit him regularly notes that his memory has been on the decline, but he is able to recognize her and their three children.  She is states he usually does not remember his age and currently cannot tell me why he is here.  He sustained a wound on the right side of his head after being hospitalized for abscess and cellulitis of the scalp last year.  Ever since that time he has been at the [facility].   His work-up was consistent with pneumonia and CHF, he was seen by cardiology and admitted to the hospital.   Subjective:   Patient in bed, appears comfortable, denies any headache, no fever, no chest pain or pressure, much improved shortness of breath , no abdominal pain. No new focal weakness.    Assessment  & Plan :    Acute hypoxic respiratory failure secondary to pneumonia and acute on chronic diastolic CHF.  He had subjective fevers, leukocytosis, elevated procalcitonin along with CT evidence of possible right lower lobe pneumonia.  His work-up is also consistent with fluid overload and CHF  decompensation.  Continue on empiric antibiotics for total of 5 days, de-escalate to oral, speech therapy to evaluate to rule out ongoing aspiration, feeding assistance and aspiration precautions, diuretics as tolerated.  Continue oxygen supplementation and supportive care.  Encouraged to sit up in chair in the daytime use I-S and flutter valve for pulmonary toiletry.    Acute  on chronic combined systolic and diastolic CHF.  Last known EF 55%, EF has not dropped to 35% with wall motion abnormalities, cardiology is following, patient on combination of Coreg, Imdur, hydralazine, Jardiance, diuretics as tolerated, has been adequately diuresed.  Renal function worsened a little bit on 06/21/2022 hence withhold further diuretics, peripheral edema almost completely resolved, minimal Rales.  Gentle hydration for 500 cc on 06/21/2022.  Poor candidate for invasive testing due to underlying dementia advanced age and borderline renal function.    Non-ACS pattern elevation of troponin.  Trend is flat likely due to demand mismatch caused by hypoxia from #1 above.  Continue aspirin, low-dose beta-blocker if tolerated by blood pressure and heart rate, continue statin for secondary prevention.  Gradually progressive dementia.  At risk for delirium.  Minimize narcotics and benzodiazepines.  PT, OT.  Post discharge go back to SNF.  Dyslipidemia.  On statin.  Hx of stroke.  Continue combination of aspirin and statin for secondary prevention.  History of prostate cancer.  Patient s/p radiation treatment about 11 years ago now thought to be in remission.  Outpatient follow-up by PCP.  Mild AKI on CKD stage III A.  Baseline creatinine around 1.2.  Hold further diuretics and nephrotoxins on 06/21/2022, gentle hydration with 500 cc of IV fluid and monitor.  Check UA, Ur lytes & bladder scan postvoid.      Condition - Extremely Guarded  Family Communication  :  None present  Called ex wife Camie on 06/19/2022    Code  Status :  Full  Consults  :  Cards  PUD Prophylaxis : PPI   Procedures  :     TTE -  1. Left ventricular ejection fraction, by estimation, is 30 to 35%. The left ventricle has moderately decreased function. The left ventricle demonstrates regional wall motion abnormalities (see scoring diagram/findings for description). Indeterminate diastolic filling due to E-A fusion. There is severe hypokinesis of the left ventricular, entire inferoseptal wall and inferior wall.  2. Right ventricular systolic function is severely reduced. The right ventricular size is severely enlarged. Tricuspid regurgitation signal is inadequate for assessing PA pressure.  3. Left atrial size was mildly dilated.  4. Right atrial size was mildly dilated.  5. The mitral valve is normal in structure. Trivial mitral valve regurgitation.  6. The aortic valve is tricuspid. There is mild calcification of the aortic valve. Aortic valve regurgitation is not visualized. Aortic valve sclerosis/calcification is present, without any evidence of aortic stenosis.  7. The inferior vena cava is dilated in size with <50% respiratory variability, suggesting right atrial pressure of 15 mmHg.  CTA - 1. No pulmonary embolus. 2. Ill-defined opacity in the medial aspect of the right lower lobe may be atelectasis or pneumonia. 3. Moderate multi chamber cardiomegaly with coronary artery calcifications. 4. Dilated main pulmonary artery suggesting pulmonary arterial hypertension. 5. Mild multifocal mediastinal and hilar adenopathy and shotty lymph nodes, likely reactive. 6. Elevated left hemidiaphragm with adjacent compressive atelectasis in the lingula and left lower lobe. Aortic Atherosclerosis.      Disposition Plan  :    Status is: Inpatient  DVT Prophylaxis  :    Place TED hose Start: 06/21/22 0556 Place TED hose Start: 06/19/22 0832 enoxaparin (LOVENOX) injection 40 mg Start: 06/18/22 1800    Lab Results  Component Value Date   PLT 175  06/21/2022    Diet :  Diet Order             Diet Heart  Room service appropriate? Yes; Fluid consistency: Thin; Fluid restriction: 1500 mL Fluid  Diet effective now                    Inpatient Medications  Scheduled Meds:  aspirin EC  81 mg Oral Daily   atorvastatin  40 mg Oral QPM   carvedilol  3.125 mg Oral BID WC   cephALEXin  500 mg Oral Q12H   doxycycline  100 mg Oral Q12H   [START ON 06/22/2022] empagliflozin  10 mg Oral Daily   enoxaparin (LOVENOX) injection  40 mg Subcutaneous Q68T   folic acid  1 mg Oral Daily   hydrALAZINE  25 mg Oral Q8H   isosorbide mononitrate  15 mg Oral Daily   latanoprost  1 drop Both Eyes QHS   pantoprazole  40 mg Oral Daily   simethicone  120 mg Oral BID   sodium chloride flush  3 mL Intravenous Q12H   thiamine  100 mg Oral Daily   Continuous Infusions:  lactated ringers     PRN Meds:.acetaminophen **OR** acetaminophen, albuterol  Time Spent in minutes  30   Lala Lund M.D on 06/21/2022 at 10:05 AM  To page go to [email or web address]   [facility] -  Office  [phone]  See all Orders from today for further details    Objective:   Vitals:   06/21/22 0000 06/21/22 0400 06/21/22 0639 06/21/22 0800  BP: 125/82 (!) 167/79  (!) 134/90  Pulse: 71 71 72 66  Resp: 19 18 (!) 24 (!) 22  Temp: 99.1 F (37.3 C) 98.3 F (36.8 C)  97.9 F (36.6 C)  TempSrc: Oral Oral  Oral  SpO2: 96% 94% 93% 93%  Weight:      Height:        Wt Readings from Last 3 Encounters:  06/18/22 103.7 kg  09/20/21 99.8 kg  02/12/21 102.1 kg     Intake/Output Summary (Last 24 hours) at 06/21/2022 1005 Last data filed at 06/21/2022 0500 Gross per 24 hour  Intake --  Output 1350 ml  Net -1350 ml     Physical Exam  Awake Alert, No new F.N deficits, Normal affect [place].AT,PERRAL Supple Neck, No JVD,   Symmetrical Chest wall movement, Good air movement bilaterally, few rales RRR,No Gallops, Rubs or new Murmurs,  +ve B.Sounds, Abd Soft,  No tenderness,   No Cyanosis, no edema    Data Review:    CBC Recent Labs  Lab 06/18/22 1134 06/18/22 1351 06/18/22 1748 06/20/22 0253 06/21/22 0303  WBC  --  12.9*  --  10.1 7.0  HGB 17.7* 14.9 16.7 15.3 14.9  HCT 52.0 49.7 49.0 51.1 49.1  PLT  --  182  --  190 175  MCV  --  91.4  --  91.3 91.4  MCH  --  27.4  --  27.3 27.7  MCHC  --  30.0  --  29.9* 30.3  RDW  --  17.4*  --  17.6* 16.9*  LYMPHSABS  --  0.3*  --  0.8 0.9  MONOABS  --  0.5  --  1.0 1.0  EOSABS  --  0.0  --  0.2 0.3  BASOSABS  --  0.0  --  0.0 0.0    Electrolytes Recent Labs  Lab 06/18/22 1351 06/18/22 1748 06/18/22 1826 06/18/22 2008 06/18/22 2011 06/18/22 2225 06/19/22 0925 06/20/22 0253 06/21/22 0303  NA 140 141  --   --   --   --  143 142 142  K 3.9 4.0  --   --   --   --  4.9 4.2 4.4  CL 101  --   --   --   --   --  104 99 95*  CO2 26  --   --   --   --   --  30 33* 38*  GLUCOSE 125*  --   --   --   --   --  83 91 89  BUN 13  --   --   --   --   --  '18 17 19  '$ CREATININE 1.24  --   --   --   --   --  1.14 1.10 1.53*  CALCIUM 8.9  --   --   --   --   --  8.9 9.1 9.2  AST  --   --   --   --   --   --   --  25 19  ALT  --   --   --   --   --   --   --  22 19  ALKPHOS  --   --   --   --   --   --   --  51 47  BILITOT  --   --   --   --   --   --   --  1.3* 1.3*  ALBUMIN  --   --   --   --   --   --   --  3.6 3.3*  MG  --   --   --  1.9  --   --   --  2.1 2.1  PROCALCITON  --   --  6.86  --   --   --   --   --   --   LATICACIDVEN  --   --  4.7*  --  4.3*  --   --   --   --   BNP 1,267.5*  --   --   --   --  1,915.9*  --  554.9* 406.8*    ------------------------------------------------------------------------------------------------------------------ Recent Labs    06/19/22 0925  CHOL 102  HDL 36*  LDLCALC 62  TRIG 22  CHOLHDL 2.8    Radiology Reports DG Chest Port 1 View  Result Date: 06/21/2022 CLINICAL DATA:  81 year old male with history of acute respiratory failure with  hypoxia and hypercapnia. EXAM: PORTABLE CHEST 1 VIEW COMPARISON:  Chest x-ray 06/18/2022. FINDINGS: Elevation of the left hemidiaphragm. Opacity at the left base which may reflect atelectasis and/or consolidation. Possible small left pleural effusion. No definite right pleural effusion. No pneumothorax. No evidence of pulmonary edema. Heart size is mildly enlarged. Upper mediastinal contours are within normal limits. IMPRESSION: 1. Chronic elevation of the left hemidiaphragm with atelectasis and/or consolidation in the left lung base, with potential small left pleural effusion. 2. Cardiomegaly. Electronically Signed   By: Vinnie Langton M.D.   On: 06/21/2022 06:41   ECHOCARDIOGRAM COMPLETE  Result Date: 06/19/2022    ECHOCARDIOGRAM REPORT   Patient Name:   MADHAV MOHON Date of Exam: 06/19/2022 Medical Rec #:  [id number]   Height:       73.0 in Accession #:    [id number]  Weight:       228.6 lb Date of Birth:  08/11/1941   BSA:          2.277 m Patient Age:  81 years    BP:           129/75 mmHg Patient Gender: M           HR:           79 bpm. Exam Location:  Inpatient Procedure: 2D Echo, Cardiac Doppler, Color Doppler and Intracardiac            Opacification Agent Indications:    Cardiomegaly  History:        Patient has prior history of Echocardiogram examinations, most                 recent 09/14/2020. Stroke; Risk Factors:Former Smoker and                 Hypertension. Cancer.  Sonographer:    Eartha Inch Referring Phys: [id number] RONDELL A SMITH  Sonographer Comments: Technically difficult study due to poor echo windows. Image acquisition challenging due to patient body habitus. IMPRESSIONS  1. Left ventricular ejection fraction, by estimation, is 30 to 35%. The left ventricle has moderately decreased function. The left ventricle demonstrates regional wall motion abnormalities (see scoring diagram/findings for description). Indeterminate diastolic filling due to E-A fusion. There is severe hypokinesis  of the left ventricular, entire inferoseptal wall and inferior wall.  2. Right ventricular systolic function is severely reduced. The right ventricular size is severely enlarged. Tricuspid regurgitation signal is inadequate for assessing PA pressure.  3. Left atrial size was mildly dilated.  4. Right atrial size was mildly dilated.  5. The mitral valve is normal in structure. Trivial mitral valve regurgitation.  6. The aortic valve is tricuspid. There is mild calcification of the aortic valve. Aortic valve regurgitation is not visualized. Aortic valve sclerosis/calcification is present, without any evidence of aortic stenosis.  7. The inferior vena cava is dilated in size with <50% respiratory variability, suggesting right atrial pressure of 15 mmHg. FINDINGS  Left Ventricle: Left ventricular ejection fraction, by estimation, is 30 to 35%. The left ventricle has moderately decreased function. The left ventricle demonstrates regional wall motion abnormalities. Severe hypokinesis of the left ventricular, entire  inferoseptal wall and inferior wall. Definity contrast agent was given IV to delineate the left ventricular endocardial borders. The left ventricular internal cavity size was normal in size. There is no left ventricular hypertrophy. Abnormal (paradoxical) septal motion, consistent with left bundle branch block. Indeterminate diastolic filling due to E-A fusion. Right Ventricle: The right ventricular size is severely enlarged. Right vetricular wall thickness was not well visualized. Right ventricular systolic function is severely reduced. Tricuspid regurgitation signal is inadequate for assessing PA pressure. The tricuspid regurgitant velocity is 2.31 m/s, and with an assumed right atrial pressure of 15 mmHg, the estimated right ventricular systolic pressure is 29.4 mmHg. Left Atrium: Left atrial size was mildly dilated. Right Atrium: Right atrial size was mildly dilated. Pericardium: There is no evidence of  pericardial effusion. Mitral Valve: The mitral valve is normal in structure. Trivial mitral valve regurgitation. Tricuspid Valve: The tricuspid valve is normal in structure. Tricuspid valve regurgitation is mild. Aortic Valve: The aortic valve is tricuspid. There is mild calcification of the aortic valve. Aortic valve regurgitation is not visualized. Aortic valve sclerosis/calcification is present, without any evidence of aortic stenosis. Pulmonic Valve: The pulmonic valve was not well visualized. Pulmonic valve regurgitation is not visualized. Aorta: The aortic root is normal in size and structure. Venous: The inferior vena cava is dilated in size with less than 50% respiratory variability, suggesting right  atrial pressure of 15 mmHg. IAS/Shunts: The interatrial septum was not well visualized.  LEFT VENTRICLE PLAX 2D LVIDd:         5.10 cm   Diastology LVIDs:         4.20 cm   LV e' medial:    8.03 cm/s LV PW:         1.00 cm   LV E/e' medial:  14.1 LV IVS:        0.80 cm   LV e' lateral:   15.60 cm/s LVOT diam:     2.60 cm   LV E/e' lateral: 7.2 LV SV:         66 LV SV Index:   29 LVOT Area:     5.31 cm  RIGHT VENTRICLE            IVC RV S prime:     4.95 cm/s  IVC diam: 2.40 cm TAPSE (M-mode): 1.1 cm LEFT ATRIUM             Index        RIGHT ATRIUM           Index LA diam:        4.00 cm 1.76 cm/m   RA Area:     22.60 cm LA Vol (A2C):   45.8 ml 20.11 ml/m  RA Volume:   80.00 ml  35.13 ml/m LA Vol (A4C):   42.8 ml 18.79 ml/m LA Biplane Vol: 44.6 ml 19.58 ml/m  AORTIC VALVE LVOT Vmax:   77.50 cm/s LVOT Vmean:  59.800 cm/s LVOT VTI:    0.125 m  AORTA Ao Root diam: 3.50 cm MITRAL VALVE                TRICUSPID VALVE MV Area (PHT): 4.36 cm     TR Peak grad:   21.3 mmHg MV Decel Time: 174 msec     TR Vmax:        231.00 cm/s MV E velocity: 113.00 cm/s                             SHUNTS                             Systemic VTI:  0.12 m                             Systemic Diam: 2.60 cm Dani Gobble Croitoru MD  Electronically signed by Sanda Klein MD Signature Date/Time: 06/19/2022/2:14:58 PM    Final    CT Angio Chest PE W and/or Wo Contrast  Result Date: 06/18/2022 CLINICAL DATA:  Pulmonary embolism (PE) suspected, high prob Shortness of breath.  Lethargy. EXAM: CT ANGIOGRAPHY CHEST WITH CONTRAST TECHNIQUE: Multidetector CT imaging of the chest was performed using the standard protocol during bolus administration of intravenous contrast. Multiplanar CT image reconstructions and MIPs were obtained to evaluate the vascular anatomy. RADIATION DOSE REDUCTION: This exam was performed according to the departmental dose-optimization program which includes automated exposure control, adjustment of the mA and/or kV according to patient size and/or use of iterative reconstruction technique. CONTRAST:  18m OMNIPAQUE IOHEXOL 350 MG/ML SOLN COMPARISON:  Radiograph earlier today. FINDINGS: Cardiovascular: Examination is diagnostic to the level of the segmental pulmonary arteries. The subsegmental branches are not well assessed. Allowing for this limitation, there are no filling defects  within the pulmonary arteries to suggest pulmonary embolus. Dilated main pulmonary artery 3.7 cm. Moderate multi chamber cardiomegaly. No pericardial effusion. There are coronary artery calcifications. Atherosclerosis of the thoracic aorta. No aneurysm. Cannot assess for dissection given phase of contrast tailored to pulmonary artery assessment. Mediastinum/Nodes: Mild multifocal mediastinal and hilar adenopathy and shotty lymph nodes. For example AP window node measures 14 mm, series 5, image 43. right hilar node measures 13 mm, series 5, image 60. Left hilar nodes measure up to 13 mm series 5, image 64. The esophagus is decompressed. No visualized thyroid nodule. No bulky axillary adenopathy. Lungs/Pleura: Elevated left hemidiaphragm with adjacent compressive atelectasis in the lingula and left lower lobe. There is also mild dependent  atelectasis within both lungs. Ill-defined opacity in the medial aspect of the right lower lobe. No pleural fluid. No features of pulmonary edema. Minimal retained mucus in the trachea. Imaging obtained in expiration limiting assessment of the trachea and central bronchi. No pulmonary mass. Upper Abdomen: No acute findings. Simple left renal cyst. No follow-up imaging is needed. Musculoskeletal: Thoracic spondylosis with multilevel endplate spurring. Prominent Schmorl's node involves superior endplate of K93. Review of the MIP images confirms the above findings. IMPRESSION: 1. No pulmonary embolus. 2. Ill-defined opacity in the medial aspect of the right lower lobe may be atelectasis or pneumonia. 3. Moderate multi chamber cardiomegaly with coronary artery calcifications. 4. Dilated main pulmonary artery suggesting pulmonary arterial hypertension. 5. Mild multifocal mediastinal and hilar adenopathy and shotty lymph nodes, likely reactive. 6. Elevated left hemidiaphragm with adjacent compressive atelectasis in the lingula and left lower lobe. Aortic Atherosclerosis (ICD10-I70.0). Electronically Signed   By: Keith Rake M.D.   On: 06/18/2022 16:14   DG Chest Portable 1 View  Result Date: 06/18/2022 CLINICAL DATA:  Shortness of breath. EXAM: PORTABLE CHEST 1 VIEW COMPARISON:  09/13/2020 FINDINGS: There is rotational artifact. Apparent cardiac enlargement may reflect rotational artifact. Pulmonary vascular congestion is noted. Unchanged chronic asymmetric elevation of the left hemidiaphragm with overlying scar versus atelectasis. No signs pleural effusion or frank interstitial edema. No airspace opacities. IMPRESSION: 1. Cardiac enlargement, increased from the previous exam. 2. Pulmonary vascular congestion. 3. Chronic asymmetric elevation of the left hemidiaphragm with overlying atelectasis or scar. Electronically Signed   By: Kerby Moors M.D.   On: 06/18/2022 12:38

## 2022-06-21 NOTE — Plan of Care (Signed)
  Problem: Clinical Measurements: Goal: Diagnostic test results will improve Outcome: Progressing Goal: Respiratory complications will improve Outcome: Progressing Goal: Cardiovascular complication will be avoided Outcome: Progressing   Problem: Activity: Goal: Risk for activity intolerance will decrease Outcome: Progressing   Problem: Elimination: Goal: Will not experience complications related to bowel motility Outcome: Progressing

## 2022-06-21 NOTE — Progress Notes (Signed)
Progress Note  Patient Name: Deno Sida Date of Encounter: 06/21/2022  [facility] Cardiologist: New to [employer]  Subjective   In interim, seen by PT and OT. Was able to reach Ex-wife [name], who notes that he stays and at a rest home and has dementia.  He has three daughters but the are unable to provider care for him; she visits him and assists when he his in the hospital.  She has some medical problems and is going to PCP today.  Hopes to be there tomorrow. Patient notes that he has been UOB with no symptoms  Inpatient Medications    Scheduled Meds:  aspirin EC  81 mg Oral Daily   atorvastatin  40 mg Oral QPM   carvedilol  3.125 mg Oral BID WC   cephALEXin  500 mg Oral Q12H   doxycycline  100 mg Oral Q12H   [START ON 06/22/2022] empagliflozin  10 mg Oral Daily   enoxaparin (LOVENOX) injection  40 mg Subcutaneous G92J   folic acid  1 mg Oral Daily   hydrALAZINE  25 mg Oral Q8H   isosorbide mononitrate  15 mg Oral Daily   latanoprost  1 drop Both Eyes QHS   pantoprazole  40 mg Oral Daily   simethicone  120 mg Oral BID   sodium chloride flush  3 mL Intravenous Q12H   thiamine  100 mg Oral Daily   Continuous Infusions:  lactated ringers     PRN Meds: acetaminophen **OR** acetaminophen, albuterol   Vital Signs    Vitals:   06/21/22 0000 06/21/22 0400 06/21/22 0639 06/21/22 0800  BP: 125/82 (!) 167/79  (!) 134/90  Pulse: 71 71 72 66  Resp: 19 18 (!) 24 (!) 22  Temp: 99.1 F (37.3 C) 98.3 F (36.8 C)  97.9 F (36.6 C)  TempSrc: Oral Oral  Oral  SpO2: 96% 94% 93% 93%  Weight:      Height:        Intake/Output Summary (Last 24 hours) at 06/21/2022 1013 Last data filed at 06/21/2022 0500 Gross per 24 hour  Intake --  Output 1350 ml  Net -1350 ml      06/18/2022    7:50 PM 06/18/2022   11:00 AM 09/20/2021   10:23 AM  Last 3 Weights  Weight (lbs) 228 lb 9.9 oz 220 lb 0.3 oz 220 lb  Weight (kg) 103.7 kg 99.8 kg 99.791 kg      Telemetry    No true  NSVT or VTACH but frequent ectopy; 2100 Mobitz Type ! HB- Personally Reviewed  Physical Exam   GEN: No acute distress.   Cardiac: RRR, no murmurs, rubs, or gallops.  Respiratory: Crackles bilaterally in the bases GI: Soft, nontender, non-distended  MS: Non pitting edema LE edema, skin is warm Neuro:  Oriented to person and place, able to recount the past and remembers his ex wife Psych: Normal affect   Labs    High Sensitivity Troponin:   Recent Labs  Lab 06/18/22 1121 06/18/22 1302  TROPONINIHS 133* 134*     Chemistry Recent Labs  Lab 06/18/22 2008 06/19/22 0925 06/20/22 0253 06/21/22 0303  NA  --  143 142 142  K  --  4.9 4.2 4.4  CL  --  104 99 95*  CO2  --  30 33* 38*  GLUCOSE  --  83 91 89  BUN  --  '18 17 19  '$ CREATININE  --  1.14 1.10 1.53*  CALCIUM  --  8.9 9.1 9.2  MG 1.9  --  2.1 2.1  PROT  --   --  6.9 6.7  ALBUMIN  --   --  3.6 3.3*  AST  --   --  25 19  ALT  --   --  22 19  ALKPHOS  --   --  51 47  BILITOT  --   --  1.3* 1.3*  GFRNONAA  --  >60 >60 45*  ANIONGAP  --  '9 10 9    '$ Lipids  Recent Labs  Lab 06/19/22 0925  CHOL 102  TRIG 22  HDL 36*  LDLCALC 62  CHOLHDL 2.8    Hematology Recent Labs  Lab 06/18/22 1351 06/18/22 1748 06/20/22 0253 06/21/22 0303  WBC 12.9*  --  10.1 7.0  RBC 5.44  --  5.60 5.37  HGB 14.9 16.7 15.3 14.9  HCT 49.7 49.0 51.1 49.1  MCV 91.4  --  91.3 91.4  MCH 27.4  --  27.3 27.7  MCHC 30.0  --  29.9* 30.3  RDW 17.4*  --  17.6* 16.9*  PLT 182  --  190 175   Thyroid No results for input(s): "TSH", "FREET4" in the last 168 hours.  BNP Recent Labs  Lab 06/18/22 2225 06/20/22 0253 06/21/22 0303  BNP 1,915.9* 554.9* 406.8*    DDimer No results for input(s): "DDIMER" in the last 168 hours.   Radiology    DG Chest Port 1 View  Result Date: 06/21/2022 CLINICAL DATA:  81 year old male with history of acute respiratory failure with hypoxia and hypercapnia. EXAM: PORTABLE CHEST 1 VIEW COMPARISON:  Chest x-ray  06/18/2022. FINDINGS: Elevation of the left hemidiaphragm. Opacity at the left base which may reflect atelectasis and/or consolidation. Possible small left pleural effusion. No definite right pleural effusion. No pneumothorax. No evidence of pulmonary edema. Heart size is mildly enlarged. Upper mediastinal contours are within normal limits. IMPRESSION: 1. Chronic elevation of the left hemidiaphragm with atelectasis and/or consolidation in the left lung base, with potential small left pleural effusion. 2. Cardiomegaly. Electronically Signed   By: Vinnie Langton M.D.   On: 06/21/2022 06:41   ECHOCARDIOGRAM COMPLETE  Result Date: 06/19/2022    ECHOCARDIOGRAM REPORT   Patient Name:   MICKAEL MCNUTT Date of Exam: 06/19/2022 Medical Rec #:  [id number]   Height:       73.0 in Accession #:    [id number]  Weight:       228.6 lb Date of Birth:  August 12, 1941   BSA:          2.277 m Patient Age:    30 years    BP:           129/75 mmHg Patient Gender: M           HR:           79 bpm. Exam Location:  Inpatient Procedure: 2D Echo, Cardiac Doppler, Color Doppler and Intracardiac            Opacification Agent Indications:    Cardiomegaly  History:        Patient has prior history of Echocardiogram examinations, most                 recent 09/14/2020. Stroke; Risk Factors:Former Smoker and                 Hypertension. Cancer.  Sonographer:    Eartha Inch Referring Phys: [id number] RONDELL A SMITH  Sonographer Comments: Technically difficult  study due to poor echo windows. Image acquisition challenging due to patient body habitus. IMPRESSIONS  1. Left ventricular ejection fraction, by estimation, is 30 to 35%. The left ventricle has moderately decreased function. The left ventricle demonstrates regional wall motion abnormalities (see scoring diagram/findings for description). Indeterminate diastolic filling due to E-A fusion. There is severe hypokinesis of the left ventricular, entire inferoseptal wall and inferior wall.  2. Right  ventricular systolic function is severely reduced. The right ventricular size is severely enlarged. Tricuspid regurgitation signal is inadequate for assessing PA pressure.  3. Left atrial size was mildly dilated.  4. Right atrial size was mildly dilated.  5. The mitral valve is normal in structure. Trivial mitral valve regurgitation.  6. The aortic valve is tricuspid. There is mild calcification of the aortic valve. Aortic valve regurgitation is not visualized. Aortic valve sclerosis/calcification is present, without any evidence of aortic stenosis.  7. The inferior vena cava is dilated in size with <50% respiratory variability, suggesting right atrial pressure of 15 mmHg. FINDINGS  Left Ventricle: Left ventricular ejection fraction, by estimation, is 30 to 35%. The left ventricle has moderately decreased function. The left ventricle demonstrates regional wall motion abnormalities. Severe hypokinesis of the left ventricular, entire  inferoseptal wall and inferior wall. Definity contrast agent was given IV to delineate the left ventricular endocardial borders. The left ventricular internal cavity size was normal in size. There is no left ventricular hypertrophy. Abnormal (paradoxical) septal motion, consistent with left bundle branch block. Indeterminate diastolic filling due to E-A fusion. Right Ventricle: The right ventricular size is severely enlarged. Right vetricular wall thickness was not well visualized. Right ventricular systolic function is severely reduced. Tricuspid regurgitation signal is inadequate for assessing PA pressure. The tricuspid regurgitant velocity is 2.31 m/s, and with an assumed right atrial pressure of 15 mmHg, the estimated right ventricular systolic pressure is 33.5 mmHg. Left Atrium: Left atrial size was mildly dilated. Right Atrium: Right atrial size was mildly dilated. Pericardium: There is no evidence of pericardial effusion. Mitral Valve: The mitral valve is normal in structure.  Trivial mitral valve regurgitation. Tricuspid Valve: The tricuspid valve is normal in structure. Tricuspid valve regurgitation is mild. Aortic Valve: The aortic valve is tricuspid. There is mild calcification of the aortic valve. Aortic valve regurgitation is not visualized. Aortic valve sclerosis/calcification is present, without any evidence of aortic stenosis. Pulmonic Valve: The pulmonic valve was not well visualized. Pulmonic valve regurgitation is not visualized. Aorta: The aortic root is normal in size and structure. Venous: The inferior vena cava is dilated in size with less than 50% respiratory variability, suggesting right atrial pressure of 15 mmHg. IAS/Shunts: The interatrial septum was not well visualized.  LEFT VENTRICLE PLAX 2D LVIDd:         5.10 cm   Diastology LVIDs:         4.20 cm   LV e' medial:    8.03 cm/s LV PW:         1.00 cm   LV E/e' medial:  14.1 LV IVS:        0.80 cm   LV e' lateral:   15.60 cm/s LVOT diam:     2.60 cm   LV E/e' lateral: 7.2 LV SV:         66 LV SV Index:   29 LVOT Area:     5.31 cm  RIGHT VENTRICLE            IVC RV S prime:  4.95 cm/s  IVC diam: 2.40 cm TAPSE (M-mode): 1.1 cm LEFT ATRIUM             Index        RIGHT ATRIUM           Index LA diam:        4.00 cm 1.76 cm/m   RA Area:     22.60 cm LA Vol (A2C):   45.8 ml 20.11 ml/m  RA Volume:   80.00 ml  35.13 ml/m LA Vol (A4C):   42.8 ml 18.79 ml/m LA Biplane Vol: 44.6 ml 19.58 ml/m  AORTIC VALVE LVOT Vmax:   77.50 cm/s LVOT Vmean:  59.800 cm/s LVOT VTI:    0.125 m  AORTA Ao Root diam: 3.50 cm MITRAL VALVE                TRICUSPID VALVE MV Area (PHT): 4.36 cm     TR Peak grad:   21.3 mmHg MV Decel Time: 174 msec     TR Vmax:        231.00 cm/s MV E velocity: 113.00 cm/s                             SHUNTS                             Systemic VTI:  0.12 m                             Systemic Diam: 2.60 cm Dani Gobble Croitoru MD Electronically signed by Sanda Klein MD Signature Date/Time: 06/19/2022/2:14:58 PM     Final      Patient Profile     81 y.o. male with medical history significant of hypertension, hyperlipidemia, CVA, central retinal artery occlusion, prostate caner s/p radiation, progressive dementia and prior tobacco abuse who presented after being found to be hypoxic by facility staff. Normally he was not on oxygen. His ex wife who comes to visit him regularly notes that his memory has been on the decline, but he is able to recognize her and their three children.  She is states he usually does not remember his age.   Assessment & Plan     Heart Failure with Reduced Ejection Fraction (biventricular with ischemic changes) Dementia Sepsis and PNA HTN and HLD Frequent PVCs - acute on chronic with mixed picture hypoxic respiratory failure;  - given his dementia, we will proceed with medical therapy but will defer ischemic evaluation unless having CP - PNA therapy as per primary - BNP elevated but dramatically improved; Lasix held today may need low dose tomorrow - increase coreg to 6.25 mg PO BID today given PVCs - continue SGLT2i; will not add MRA or ANRI until AKI resolves (may be outpatient - elevated troponin likely demand but he likely has underlying CAD    For questions or updates, please contact [facility] Please consult [email or web address] for contact info under        Signed, Werner Lean, MD  06/21/2022, 10:13 AM

## 2022-06-21 NOTE — Care Management Important Message (Signed)
Important Message  Patient Details  Name: Tommy Bauer MRN: [id number] Date of Birth: 02-03-1941   Medicare Important Message Given:  Yes     Bree Heinzelman Montine Circle 06/21/2022, 2:55 PM

## 2022-06-22 ENCOUNTER — Other Ambulatory Visit (HOSPITAL_COMMUNITY): Payer: Self-pay

## 2022-06-22 DIAGNOSIS — J9602 Acute respiratory failure with hypercapnia: Secondary | ICD-10-CM | POA: Diagnosis not present

## 2022-06-22 DIAGNOSIS — J9601 Acute respiratory failure with hypoxia: Secondary | ICD-10-CM | POA: Diagnosis not present

## 2022-06-22 DIAGNOSIS — I5033 Acute on chronic diastolic (congestive) heart failure: Secondary | ICD-10-CM | POA: Diagnosis not present

## 2022-06-22 DIAGNOSIS — J189 Pneumonia, unspecified organism: Secondary | ICD-10-CM | POA: Diagnosis not present

## 2022-06-22 LAB — CBC WITH DIFFERENTIAL/PLATELET
Abs Immature Granulocytes: 0.01 10*3/uL (ref 0.00–0.07)
Basophils Absolute: 0 10*3/uL (ref 0.0–0.1)
Basophils Relative: 0 %
Eosinophils Absolute: 0.2 10*3/uL (ref 0.0–0.5)
Eosinophils Relative: 4 %
HCT: 47.4 % (ref 39.0–52.0)
Hemoglobin: 14.5 g/dL (ref 13.0–17.0)
Immature Granulocytes: 0 %
Lymphocytes Relative: 15 %
Lymphs Abs: 0.7 10*3/uL (ref 0.7–4.0)
MCH: 27.9 pg (ref 26.0–34.0)
MCHC: 30.6 g/dL (ref 30.0–36.0)
MCV: 91.2 fL (ref 80.0–100.0)
Monocytes Absolute: 0.7 10*3/uL (ref 0.1–1.0)
Monocytes Relative: 15 %
Neutro Abs: 3.1 10*3/uL (ref 1.7–7.7)
Neutrophils Relative %: 66 %
Platelets: 164 10*3/uL (ref 150–400)
RBC: 5.2 MIL/uL (ref 4.22–5.81)
RDW: 16.2 % — ABNORMAL HIGH (ref 11.5–15.5)
WBC: 4.8 10*3/uL (ref 4.0–10.5)
nRBC: 0 % (ref 0.0–0.2)

## 2022-06-22 LAB — COMPREHENSIVE METABOLIC PANEL
ALT: 16 U/L (ref 0–44)
AST: 16 U/L (ref 15–41)
Albumin: 3.1 g/dL — ABNORMAL LOW (ref 3.5–5.0)
Alkaline Phosphatase: 45 U/L (ref 38–126)
Anion gap: 8 (ref 5–15)
BUN: 15 mg/dL (ref 8–23)
CO2: 33 mmol/L — ABNORMAL HIGH (ref 22–32)
Calcium: 8.9 mg/dL (ref 8.9–10.3)
Chloride: 97 mmol/L — ABNORMAL LOW (ref 98–111)
Creatinine, Ser: 1.16 mg/dL (ref 0.61–1.24)
GFR, Estimated: >60 mL/min (ref >60)
Glucose, Bld: 95 mg/dL (ref 70–99)
Potassium: 3.8 mmol/L (ref 3.5–5.1)
Sodium: 138 mmol/L (ref 135–145)
Total Bilirubin: 1.6 mg/dL — ABNORMAL HIGH (ref 0.3–1.2)
Total Protein: 6.1 g/dL — ABNORMAL LOW (ref 6.5–8.1)

## 2022-06-22 LAB — BRAIN NATRIURETIC PEPTIDE: B Natriuretic Peptide: 431.3 pg/mL — ABNORMAL HIGH (ref 0.0–100.0)

## 2022-06-22 LAB — MAGNESIUM: Magnesium: 1.8 mg/dL (ref 1.7–2.4)

## 2022-06-22 MED ORDER — FUROSEMIDE 40 MG PO TABS: 40.0000 mg | ORAL_TABLET | Freq: Every day | ORAL | Status: DC

## 2022-06-22 MED ORDER — CARVEDILOL 12.5 MG PO TABS
12.5000 mg | ORAL_TABLET | Freq: Two times a day (BID) | ORAL | Status: DC
  Administered 2022-06-22 – 2022-06-25 (×6): 12.5 mg via ORAL
  Filled 2022-06-22 (×6): qty 1

## 2022-06-22 MED ORDER — FUROSEMIDE 40 MG PO TABS
40.0000 mg | ORAL_TABLET | Freq: Two times a day (BID) | ORAL | Status: DC
  Administered 2022-06-22 – 2022-06-23 (×3): 40 mg via ORAL
  Filled 2022-06-22 (×3): qty 1

## 2022-06-22 MED ORDER — MAGNESIUM SULFATE 2 GM/50ML IV SOLN
2.0000 g | Freq: Once | INTRAVENOUS | Status: AC
  Administered 2022-06-22: 2 g via INTRAVENOUS
  Filled 2022-06-22: qty 50

## 2022-06-22 MED ORDER — POTASSIUM CHLORIDE 20 MEQ PO PACK
20.0000 meq | PACK | Freq: Two times a day (BID) | ORAL | Status: DC
  Administered 2022-06-22 – 2022-06-28 (×13): 20 meq via ORAL
  Filled 2022-06-22 (×13): qty 1

## 2022-06-22 MED ORDER — BUTALBITAL-APAP-CAFFEINE 50-325-40 MG PO TABS
2.0000 | ORAL_TABLET | Freq: Four times a day (QID) | ORAL | Status: DC | PRN
  Administered 2022-06-22: 2 via ORAL
  Filled 2022-06-22: qty 2

## 2022-06-22 NOTE — Progress Notes (Signed)
PROGRESS NOTE                                                                                                                                                                                                             Patient Demographics:    Tommy Bauer, is a 81 y.o. male, DOB - 1941-02-20, IOE:[id number]  Outpatient Primary MD for the patient is Garwin Brothers, MD    LOS - 4  Admit date - 06/18/2022    Chief Complaint  Patient presents with   Shortness of Breath       Brief Narrative (HPI from H&P)     81 y.o. male with medical history significant of hypertension, hyperlipidemia, CVA, central retinal artery occlusion, prostate caner s/p radiation, progressive dementia and prior tobacco abuse who presented after being found to be hypoxic by facility staff. Normally he was not on oxygen. His ex wife who comes to visit him regularly notes that his memory has been on the decline, but he is able to recognize her and their three children.  She is states he usually does not remember his age and currently cannot tell me why he is here.  He sustained a wound on the right side of his head after being hospitalized for abscess and cellulitis of the scalp last year.  Ever since that time he has been at the [facility].   His work-up was consistent with pneumonia and CHF, he was seen by cardiology and admitted to the hospital.   Subjective:   Patient denies any dyspnea, chest pain, no fever, no chills, he complains of headache   Assessment  & Plan :    Acute hypoxic respiratory failure secondary to pneumonia and acute on chronic diastolic CHF.  He had subjective fevers, leukocytosis, elevated procalcitonin along with CT evidence of possible right lower lobe pneumonia.  His work-up is also consistent with fluid overload and CHF decompensation. -Treated initially with IV antibiotics, improving, he is to transition to oral regimen  currently . - speech therapy to evaluate to rule out ongoing aspiration, feeding assistance and aspiration precautions, diuretics as tolerated.  Continue oxygen supplementation and supportive care.  Encouraged to sit up in chair in the daytime use I-S and flutter valve for pulmonary toiletry.    Acute on chronic combined systolic and diastolic CHF.   -Last  known EF 55%, EF has not dropped to 35% with wall motion abnormalities, cardiology is following, currently on diuretics, which include Jardiance, Lasix and Aldactone, on hydralazine, heart rate stable we will try and add low-dose Coreg.  Cardiology on board defer further management or work-up to cardiology.  He is currently transition to oral Lasix.   Non-ACS pattern elevation of troponin.  Trend is flat likely due to demand mismatch caused by hypoxia from #1 above.  Continue aspirin, low-dose beta-blocker if tolerated by blood pressure and heart rate, continue statin for secondary prevention.  Gradually progressive dementia.  At risk for delirium.  Minimize narcotics and benzodiazepines.  PT, OT.  Post discharge go back to SNF.  Dyslipidemia.  On statin.  Hx of stroke.  Continue combination of aspirin and statin for secondary prevention.  History of prostate cancer.  Patient s/p radiation treatment about 11 years ago now thought to be in remission.  Outpatient follow-up by PCP.  CKD stage III A.  Baseline creatinine around 1.2.  Monitor.  Headache resolved after receiving Fioricet      Condition - Extremely Guarded  Family Communication  :  None present  Called ex wife Tommy Bauer on 06/19/2022    Code Status :  Full  Consults  :  Cards  PUD Prophylaxis : PPI   Procedures  :     TTE -  1. Left ventricular ejection fraction, by estimation, is 30 to 35%. The left ventricle has moderately decreased function. The left ventricle demonstrates regional wall motion abnormalities (see scoring diagram/findings for description). Indeterminate  diastolic filling due to E-A fusion. There is severe hypokinesis of the left ventricular, entire inferoseptal wall and inferior wall.  2. Right ventricular systolic function is severely reduced. The right ventricular size is severely enlarged. Tricuspid regurgitation signal is inadequate for assessing PA pressure.  3. Left atrial size was mildly dilated.  4. Right atrial size was mildly dilated.  5. The mitral valve is normal in structure. Trivial mitral valve regurgitation.  6. The aortic valve is tricuspid. There is mild calcification of the aortic valve. Aortic valve regurgitation is not visualized. Aortic valve sclerosis/calcification is present, without any evidence of aortic stenosis.  7. The inferior vena cava is dilated in size with <50% respiratory variability, suggesting right atrial pressure of 15 mmHg.  CTA - 1. No pulmonary embolus. 2. Ill-defined opacity in the medial aspect of the right lower lobe may be atelectasis or pneumonia. 3. Moderate multi chamber cardiomegaly with coronary artery calcifications. 4. Dilated main pulmonary artery suggesting pulmonary arterial hypertension. 5. Mild multifocal mediastinal and hilar adenopathy and shotty lymph nodes, likely reactive. 6. Elevated left hemidiaphragm with adjacent compressive atelectasis in the lingula and left lower lobe. Aortic Atherosclerosis.      Disposition Plan  :    Status is: Inpatient  DVT Prophylaxis  :    Place TED hose Start: 06/21/22 0556 Place TED hose Start: 06/19/22 0832 enoxaparin (LOVENOX) injection 40 mg Start: 06/18/22 1800    Lab Results  Component Value Date   PLT 164 06/22/2022    Diet :  Diet Order             Diet Heart Room service appropriate? Yes; Fluid consistency: Thin; Fluid restriction: 1500 mL Fluid  Diet effective now                    Inpatient Medications  Scheduled Meds:  aspirin EC  81 mg Oral Daily   atorvastatin  40 mg Oral QPM   carvedilol  12.5 mg Oral BID WC    cephALEXin  500 mg Oral Q12H   doxycycline  100 mg Oral Q12H   empagliflozin  10 mg Oral Daily   enoxaparin (LOVENOX) injection  40 mg Subcutaneous P32R   folic acid  1 mg Oral Daily   furosemide  40 mg Oral BID   hydrALAZINE  25 mg Oral Q8H   isosorbide mononitrate  15 mg Oral Daily   latanoprost  1 drop Both Eyes QHS   pantoprazole  40 mg Oral Daily   potassium chloride  20 mEq Oral BID   simethicone  120 mg Oral BID   sodium chloride flush  3 mL Intravenous Q12H   thiamine  100 mg Oral Daily   Continuous Infusions:   PRN Meds:.acetaminophen **OR** acetaminophen, albuterol, butalbital-acetaminophen-caffeine  Time Spent in minutes  30   Phillips Climes M.D on 06/22/2022 at 3:42 PM  To page go to [email or web address]   [facility] -  Office  [phone]  See all Orders from today for further details    Objective:   Vitals:   06/22/22 0500 06/22/22 0800 06/22/22 0827 06/22/22 1225  BP:  (!) 159/81  122/81  Pulse:  83 82 77  Resp:  20 18 (!) 22  Temp:  98.7 F (37.1 C)  98.3 F (36.8 C)  TempSrc:  Oral  Oral  SpO2:  94% 95% 94%  Weight: 99.1 kg     Height:        Wt Readings from Last 3 Encounters:  06/22/22 99.1 kg  09/20/21 99.8 kg  02/12/21 102.1 kg     Intake/Output Summary (Last 24 hours) at 06/22/2022 1542 Last data filed at 06/22/2022 0900 Gross per 24 hour  Intake 948.85 ml  Output 300 ml  Net 648.85 ml     Physical Exam  Awake, pleasantly confused, No new F.N deficits, Normal affect Awake Alert, Oriented X 3, No new F.N deficits, Normal affect Symmetrical Chest wall movement, Good air movement bilaterally, CTAB RRR,No Gallops,Rubs or new Murmurs, No Parasternal Heave +ve B.Sounds, Abd Soft, No tenderness, No rebound - guarding or rigidity. No Cyanosis, Clubbing ,no pitting  edema, No new Rash or bruise      Data Review:    CBC Recent Labs  Lab 06/18/22 1351 06/18/22 1748 06/20/22 0253 06/21/22 0303 06/22/22 0425  WBC 12.9*  --   10.1 7.0 4.8  HGB 14.9 16.7 15.3 14.9 14.5  HCT 49.7 49.0 51.1 49.1 47.4  PLT 182  --  190 175 164  MCV 91.4  --  91.3 91.4 91.2  MCH 27.4  --  27.3 27.7 27.9  MCHC 30.0  --  29.9* 30.3 30.6  RDW 17.4*  --  17.6* 16.9* 16.2*  LYMPHSABS 0.3*  --  0.8 0.9 0.7  MONOABS 0.5  --  1.0 1.0 0.7  EOSABS 0.0  --  0.2 0.3 0.2  BASOSABS 0.0  --  0.0 0.0 0.0    Electrolytes Recent Labs  Lab 06/18/22 1351 06/18/22 1748 06/18/22 1826 06/18/22 2008 06/18/22 2011 06/18/22 2225 06/19/22 0925 06/20/22 0253 06/21/22 0303 06/22/22 0425  NA 140 141  --   --   --   --  143 142 142 138  K 3.9 4.0  --   --   --   --  4.9 4.2 4.4 3.8  CL 101  --   --   --   --   --  104 99 95* 97*  CO2 26  --   --   --   --   --  30 33* 38* 33*  GLUCOSE 125*  --   --   --   --   --  83 91 89 95  BUN 13  --   --   --   --   --  '18 17 19 15  '$ CREATININE 1.24  --   --   --   --   --  1.14 1.10 1.53* 1.16  CALCIUM 8.9  --   --   --   --   --  8.9 9.1 9.2 8.9  AST  --   --   --   --   --   --   --  '25 19 16  '$ ALT  --   --   --   --   --   --   --  '22 19 16  '$ ALKPHOS  --   --   --   --   --   --   --  51 47 45  BILITOT  --   --   --   --   --   --   --  1.3* 1.3* 1.6*  ALBUMIN  --   --   --   --   --   --   --  3.6 3.3* 3.1*  MG  --   --   --  1.9  --   --   --  2.1 2.1 1.8  PROCALCITON  --   --  6.86  --   --   --   --   --   --   --   LATICACIDVEN  --   --  4.7*  --  4.3*  --   --   --   --   --   BNP 1,267.5*  --   --   --   --  1,915.9*  --  554.9* 406.8* 431.3*    ------------------------------------------------------------------------------------------------------------------ No results for input(s): "CHOL", "HDL", "LDLCALC", "TRIG", "CHOLHDL", "LDLDIRECT" in the last 72 hours.   No results found for: "HGBA1C"  No results for input(s): "TSH", "T4TOTAL", "T3FREE", "THYROIDAB" in the last 72 hours.  Invalid input(s):  "FREET3" ------------------------------------------------------------------------------------------------------------------ ID Labs Recent Labs  Lab 06/18/22 1351 06/18/22 1826 06/18/22 2011 06/19/22 0925 06/20/22 0253 06/21/22 0303 06/22/22 0425  WBC 12.9*  --   --   --  10.1 7.0 4.8  PLT 182  --   --   --  190 175 164  PROCALCITON  --  6.86  --   --   --   --   --   LATICACIDVEN  --  4.7* 4.3*  --   --   --   --   CREATININE 1.24  --   --  1.14 1.10 1.53* 1.16   Cardiac Enzymes No results for input(s): "CKMB", "TROPONINI", "MYOGLOBIN" in the last 168 hours.  Invalid input(s): "CK"  Radiology Reports DG Chest Port 1 View  Result Date: 06/21/2022 CLINICAL DATA:  81 year old male with history of acute respiratory failure with hypoxia and hypercapnia. EXAM: PORTABLE CHEST 1 VIEW COMPARISON:  Chest x-ray 06/18/2022. FINDINGS: Elevation of the left hemidiaphragm. Opacity at the left base which may reflect atelectasis and/or consolidation. Possible small left pleural effusion. No definite right pleural effusion. No pneumothorax. No evidence of pulmonary edema. Heart size is mildly enlarged. Upper mediastinal contours are within normal limits.  IMPRESSION: 1. Chronic elevation of the left hemidiaphragm with atelectasis and/or consolidation in the left lung base, with potential small left pleural effusion. 2. Cardiomegaly. Electronically Signed   By: Vinnie Langton M.D.   On: 06/21/2022 06:41   ECHOCARDIOGRAM COMPLETE  Result Date: 06/19/2022    ECHOCARDIOGRAM REPORT   Patient Name:   Tommy Bauer Date of Exam: 06/19/2022 Medical Rec #:  [id number]   Height:       73.0 in Accession #:    [id number]  Weight:       228.6 lb Date of Birth:  04-16-1941   BSA:          2.277 m Patient Age:    58 years    BP:           129/75 mmHg Patient Gender: M           HR:           79 bpm. Exam Location:  Inpatient Procedure: 2D Echo, Cardiac Doppler, Color Doppler and Intracardiac            Opacification  Agent Indications:    Cardiomegaly  History:        Patient has prior history of Echocardiogram examinations, most                 recent 09/14/2020. Stroke; Risk Factors:Former Smoker and                 Hypertension. Cancer.  Sonographer:    Eartha Inch Referring Phys: [id number] RONDELL A SMITH  Sonographer Comments: Technically difficult study due to poor echo windows. Image acquisition challenging due to patient body habitus. IMPRESSIONS  1. Left ventricular ejection fraction, by estimation, is 30 to 35%. The left ventricle has moderately decreased function. The left ventricle demonstrates regional wall motion abnormalities (see scoring diagram/findings for description). Indeterminate diastolic filling due to E-A fusion. There is severe hypokinesis of the left ventricular, entire inferoseptal wall and inferior wall.  2. Right ventricular systolic function is severely reduced. The right ventricular size is severely enlarged. Tricuspid regurgitation signal is inadequate for assessing PA pressure.  3. Left atrial size was mildly dilated.  4. Right atrial size was mildly dilated.  5. The mitral valve is normal in structure. Trivial mitral valve regurgitation.  6. The aortic valve is tricuspid. There is mild calcification of the aortic valve. Aortic valve regurgitation is not visualized. Aortic valve sclerosis/calcification is present, without any evidence of aortic stenosis.  7. The inferior vena cava is dilated in size with <50% respiratory variability, suggesting right atrial pressure of 15 mmHg. FINDINGS  Left Ventricle: Left ventricular ejection fraction, by estimation, is 30 to 35%. The left ventricle has moderately decreased function. The left ventricle demonstrates regional wall motion abnormalities. Severe hypokinesis of the left ventricular, entire  inferoseptal wall and inferior wall. Definity contrast agent was given IV to delineate the left ventricular endocardial borders. The left ventricular internal  cavity size was normal in size. There is no left ventricular hypertrophy. Abnormal (paradoxical) septal motion, consistent with left bundle branch block. Indeterminate diastolic filling due to E-A fusion. Right Ventricle: The right ventricular size is severely enlarged. Right vetricular wall thickness was not well visualized. Right ventricular systolic function is severely reduced. Tricuspid regurgitation signal is inadequate for assessing PA pressure. The tricuspid regurgitant velocity is 2.31 m/s, and with an assumed right atrial pressure of 15 mmHg, the estimated right ventricular systolic pressure is 06.2 mmHg. Left Atrium: Left atrial size  was mildly dilated. Right Atrium: Right atrial size was mildly dilated. Pericardium: There is no evidence of pericardial effusion. Mitral Valve: The mitral valve is normal in structure. Trivial mitral valve regurgitation. Tricuspid Valve: The tricuspid valve is normal in structure. Tricuspid valve regurgitation is mild. Aortic Valve: The aortic valve is tricuspid. There is mild calcification of the aortic valve. Aortic valve regurgitation is not visualized. Aortic valve sclerosis/calcification is present, without any evidence of aortic stenosis. Pulmonic Valve: The pulmonic valve was not well visualized. Pulmonic valve regurgitation is not visualized. Aorta: The aortic root is normal in size and structure. Venous: The inferior vena cava is dilated in size with less than 50% respiratory variability, suggesting right atrial pressure of 15 mmHg. IAS/Shunts: The interatrial septum was not well visualized.  LEFT VENTRICLE PLAX 2D LVIDd:         5.10 cm   Diastology LVIDs:         4.20 cm   LV e' medial:    8.03 cm/s LV PW:         1.00 cm   LV E/e' medial:  14.1 LV IVS:        0.80 cm   LV e' lateral:   15.60 cm/s LVOT diam:     2.60 cm   LV E/e' lateral: 7.2 LV SV:         66 LV SV Index:   29 LVOT Area:     5.31 cm  RIGHT VENTRICLE            IVC RV S prime:     4.95 cm/s  IVC  diam: 2.40 cm TAPSE (M-mode): 1.1 cm LEFT ATRIUM             Index        RIGHT ATRIUM           Index LA diam:        4.00 cm 1.76 cm/m   RA Area:     22.60 cm LA Vol (A2C):   45.8 ml 20.11 ml/m  RA Volume:   80.00 ml  35.13 ml/m LA Vol (A4C):   42.8 ml 18.79 ml/m LA Biplane Vol: 44.6 ml 19.58 ml/m  AORTIC VALVE LVOT Vmax:   77.50 cm/s LVOT Vmean:  59.800 cm/s LVOT VTI:    0.125 m  AORTA Ao Root diam: 3.50 cm MITRAL VALVE                TRICUSPID VALVE MV Area (PHT): 4.36 cm     TR Peak grad:   21.3 mmHg MV Decel Time: 174 msec     TR Vmax:        231.00 cm/s MV E velocity: 113.00 cm/s                             SHUNTS                             Systemic VTI:  0.12 m                             Systemic Diam: 2.60 cm Dani Gobble Croitoru MD Electronically signed by Sanda Klein MD Signature Date/Time: 06/19/2022/2:14:58 PM    Final    CT Angio Chest PE W and/or Wo Contrast  Result Date: 06/18/2022 CLINICAL DATA:  Pulmonary embolism (PE) suspected, high  prob Shortness of breath.  Lethargy. EXAM: CT ANGIOGRAPHY CHEST WITH CONTRAST TECHNIQUE: Multidetector CT imaging of the chest was performed using the standard protocol during bolus administration of intravenous contrast. Multiplanar CT image reconstructions and MIPs were obtained to evaluate the vascular anatomy. RADIATION DOSE REDUCTION: This exam was performed according to the departmental dose-optimization program which includes automated exposure control, adjustment of the mA and/or kV according to patient size and/or use of iterative reconstruction technique. CONTRAST:  21m OMNIPAQUE IOHEXOL 350 MG/ML SOLN COMPARISON:  Radiograph earlier today. FINDINGS: Cardiovascular: Examination is diagnostic to the level of the segmental pulmonary arteries. The subsegmental branches are not well assessed. Allowing for this limitation, there are no filling defects within the pulmonary arteries to suggest pulmonary embolus. Dilated main pulmonary artery 3.7 cm.  Moderate multi chamber cardiomegaly. No pericardial effusion. There are coronary artery calcifications. Atherosclerosis of the thoracic aorta. No aneurysm. Cannot assess for dissection given phase of contrast tailored to pulmonary artery assessment. Mediastinum/Nodes: Mild multifocal mediastinal and hilar adenopathy and shotty lymph nodes. For example AP window node measures 14 mm, series 5, image 43. right hilar node measures 13 mm, series 5, image 60. Left hilar nodes measure up to 13 mm series 5, image 64. The esophagus is decompressed. No visualized thyroid nodule. No bulky axillary adenopathy. Lungs/Pleura: Elevated left hemidiaphragm with adjacent compressive atelectasis in the lingula and left lower lobe. There is also mild dependent atelectasis within both lungs. Ill-defined opacity in the medial aspect of the right lower lobe. No pleural fluid. No features of pulmonary edema. Minimal retained mucus in the trachea. Imaging obtained in expiration limiting assessment of the trachea and central bronchi. No pulmonary mass. Upper Abdomen: No acute findings. Simple left renal cyst. No follow-up imaging is needed. Musculoskeletal: Thoracic spondylosis with multilevel endplate spurring. Prominent Schmorl's node involves superior endplate of TO67 Review of the MIP images confirms the above findings. IMPRESSION: 1. No pulmonary embolus. 2. Ill-defined opacity in the medial aspect of the right lower lobe may be atelectasis or pneumonia. 3. Moderate multi chamber cardiomegaly with coronary artery calcifications. 4. Dilated main pulmonary artery suggesting pulmonary arterial hypertension. 5. Mild multifocal mediastinal and hilar adenopathy and shotty lymph nodes, likely reactive. 6. Elevated left hemidiaphragm with adjacent compressive atelectasis in the lingula and left lower lobe. Aortic Atherosclerosis (ICD10-I70.0). Electronically Signed   By: MKeith RakeM.D.   On: 06/18/2022 16:14

## 2022-06-22 NOTE — Progress Notes (Signed)
Occupational Therapy Treatment Patient Details Name: Tommy Bauer MRN: [id number] DOB: 01-May-1941 Today's Date: 06/22/2022   History of present illness 81 yo male presents from [facility] with ShOB, lethargy due to acute respiratory failure and possible sepsis secondary to RLL PNA. Ptalso with HF exacerbation. PMH includes hypertension, previous CVA, hyperlipidemia, dementia, central retinal occlusion.   OT comments  Min assist for supine to sit. Pt completed grooming in sitting at EOB with supervision, stood with min assist for pericare and to change bed linen. Declined sitting up in chair despite encouragement. Pt's ex wife in room with POA papers, RN aware. Pt on 1L 02 with Sp02 of 94%.   Recommendations for follow up therapy are one component of a multi-disciplinary discharge planning process, led by the attending physician.  Recommendations may be updated based on patient status, additional functional criteria and insurance authorization.    Follow Up Recommendations  Skilled nursing-short term rehab (<3 hours/day)    Assistance Recommended at Discharge Frequent or constant Supervision/Assistance  Patient can return home with the following  A little help with walking and/or transfers;A lot of help with bathing/dressing/bathroom;Assistance with cooking/housework;Direct supervision/assist for medications management;Direct supervision/assist for financial management;Assist for transportation;Help with stairs or ramp for entrance   Equipment Recommendations  None recommended by OT    Recommendations for Other Services      Precautions / Restrictions Precautions Precautions: Fall Precaution Comments: on 1L 02 Restrictions Weight Bearing Restrictions: No       Mobility Bed Mobility Overal bed mobility: Needs Assistance Bed Mobility: Supine to Sit, Sit to Supine     Supine to sit: Min assist, HOB elevated Sit to supine: Min assist, HOB elevated   General bed mobility  comments: assist for LE and truncal management    Transfers Overall transfer level: Needs assistance Equipment used: Rolling Fruge (2 wheels) Transfers: Sit to/from Stand Sit to Stand: Min assist           General transfer comment: stood at bedside for pericare and to change bed pad     Balance Overall balance assessment: Needs assistance   Sitting balance-Leahy Scale: Fair     Standing balance support: Bilateral upper extremity supported, During functional activity, Reliant on assistive device for balance Standing balance-Leahy Scale: Poor                             ADL either performed or assessed with clinical judgement   ADL Overall ADL's : Needs assistance/impaired     Grooming: Wash/dry hands;Wash/dry face;Oral care;Sitting;Supervision/safety           Upper Body Dressing : Minimal assistance;Sitting           Toileting- Clothing Manipulation and Hygiene: Total assistance;Sit to/from stand              Extremity/Trunk Assessment              Vision       Perception     Praxis      Cognition Arousal/Alertness: Awake/alert Behavior During Therapy: WFL for tasks assessed/performed Overall Cognitive Status: History of cognitive impairments - at baseline                                          Exercises      Shoulder Instructions       General Comments  Pertinent Vitals/ Pain       Pain Assessment Pain Assessment: Faces Faces Pain Scale: Hurts little more Pain Location: LEs with touch to ankles Pain Descriptors / Indicators: Grimacing, Guarding Pain Intervention(s): Monitored during session, Repositioned  Home Living                                          Prior Functioning/Environment              Frequency  Min 2X/week        Progress Toward Goals  OT Goals(current goals can now be found in the care plan section)  Progress towards OT goals: Progressing  toward goals  Acute Rehab OT Goals OT Goal Formulation: With patient Time For Goal Achievement: 07/04/22 Potential to Achieve Goals: Good  Plan Discharge plan remains appropriate    Co-evaluation                 AM-PAC OT "6 Clicks" Daily Activity     Outcome Measure   Help from another person eating meals?: None Help from another person taking care of personal grooming?: A Little Help from another person toileting, which includes using toliet, bedpan, or urinal?: Total Help from another person bathing (including washing, rinsing, drying)?: A Lot Help from another person to put on and taking off regular upper body clothing?: A Little Help from another person to put on and taking off regular lower body clothing?: Total 6 Click Score: 14    End of Session Equipment Utilized During Treatment: Rolling Kriegel (2 wheels);Gait belt;Oxygen (1L)  OT Visit Diagnosis: Unsteadiness on feet (R26.81);Other abnormalities of gait and mobility (R26.89);Muscle weakness (generalized) (M62.81);Other symptoms and signs involving cognitive function   Activity Tolerance Patient tolerated treatment well   Patient Left in bed;with call bell/phone within reach;with bed alarm set;with family/visitor present   Nurse Communication Other (comment) (ex wife in room with POA documents)        Time: 1113-1130 OT Time Calculation (min): 17 min  Charges: OT General Charges $OT Visit: 1 Visit OT Treatments $Self Care/Home Management : 8-22 mins  Cleta Alberts, OTR/L Acute Rehabilitation Services Office: [phone]   Malka So 06/22/2022, 1:48 PM

## 2022-06-22 NOTE — Progress Notes (Signed)
Progress Note  Patient Name: Tommy Bauer Date of Encounter: 06/22/2022  [facility] Cardiologist: New to [facility]  Subjective   Creatinine has improved in interim. Patient notes that he has had some leg pain, worse when I assessed for LE edema No symptoms with PVCs. Notes headache.  Inpatient Medications    Scheduled Meds:  aspirin EC  81 mg Oral Daily   atorvastatin  40 mg Oral QPM   carvedilol  6.25 mg Oral BID WC   cephALEXin  500 mg Oral Q12H   doxycycline  100 mg Oral Q12H   empagliflozin  10 mg Oral Daily   enoxaparin (LOVENOX) injection  40 mg Subcutaneous E52D   folic acid  1 mg Oral Daily   hydrALAZINE  25 mg Oral Q8H   isosorbide mononitrate  15 mg Oral Daily   latanoprost  1 drop Both Eyes QHS   pantoprazole  40 mg Oral Daily   simethicone  120 mg Oral BID   sodium chloride flush  3 mL Intravenous Q12H   thiamine  100 mg Oral Daily   Continuous Infusions:   PRN Meds: acetaminophen **OR** acetaminophen, albuterol   Vital Signs    Vitals:   06/22/22 0400 06/22/22 0500 06/22/22 0800 06/22/22 0827  BP: (!) 150/63  (!) 159/81   Pulse: 70  83 82  Resp: '16  20 18  '$ Temp: 98.2 F (36.8 C)  98.7 F (37.1 C)   TempSrc: Oral  Oral   SpO2: 94%  94% 95%  Weight:  99.1 kg    Height:        Intake/Output Summary (Last 24 hours) at 06/22/2022 0838 Last data filed at 06/22/2022 0328 Gross per 24 hour  Intake 588.85 ml  Output 300 ml  Net 288.85 ml      06/22/2022    5:00 AM 06/18/2022    7:50 PM 06/18/2022   11:00 AM  Last 3 Weights  Weight (lbs) 218 lb 7.6 oz 228 lb 9.9 oz 220 lb 0.3 oz  Weight (kg) 99.1 kg 103.7 kg 99.8 kg      Telemetry    SR with Frequent PVCs and 1 6 beat run of NSVT- Personally Reviewed  Physical Exam   GEN: No acute distress.   Cardiac: RRR, no murmurs, rubs, or gallops.  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 Psych: Normal affect   Labs    High Sensitivity Troponin:   Recent Labs  Lab 06/18/22 1121 06/18/22 1302  TROPONINIHS 133* 134*     Chemistry Recent Labs  Lab 06/20/22 0253 06/21/22 0303 06/22/22 0425  NA 142 142 138  K 4.2 4.4 3.8  CL 99 95* 97*  CO2 33* 38* 33*  GLUCOSE 91 89 95  BUN '17 19 15  '$ CREATININE 1.10 1.53* 1.16  CALCIUM 9.1 9.2 8.9  MG 2.1 2.1 1.8  PROT 6.9 6.7 6.1*  ALBUMIN 3.6 3.3* 3.1*  AST '25 19 16  '$ ALT '22 19 16  '$ ALKPHOS 51 47 45  BILITOT 1.3* 1.3* 1.6*  GFRNONAA >60 45* >60  ANIONGAP '10 9 8    '$ Lipids  Recent Labs  Lab 06/19/22 0925  CHOL 102  TRIG 22  HDL 36*  LDLCALC 62  CHOLHDL 2.8    Hematology Recent Labs  Lab 06/20/22 0253 06/21/22 0303 06/22/22 0425  WBC 10.1 7.0  4.8  RBC 5.60 5.37 5.20  HGB 15.3 14.9 14.5  HCT 51.1 49.1 47.4  MCV 91.3 91.4 91.2  MCH 27.3 27.7 27.9  MCHC 29.9* 30.3 30.6  RDW 17.6* 16.9* 16.2*  PLT 190 175 164   Thyroid No results for input(s): "TSH", "FREET4" in the last 168 hours.  BNP Recent Labs  Lab 06/20/22 0253 06/21/22 0303 06/22/22 0425  BNP 554.9* 406.8* 431.3*    DDimer No results for input(s): "DDIMER" in the last 168 hours.   Radiology    DG Chest Port 1 View  Result Date: 06/21/2022 CLINICAL DATA:  81 year old male with history of acute respiratory failure with hypoxia and hypercapnia. EXAM: PORTABLE CHEST 1 VIEW COMPARISON:  Chest x-ray 06/18/2022. FINDINGS: Elevation of the left hemidiaphragm. Opacity at the left base which may reflect atelectasis and/or consolidation. Possible small left pleural effusion. No definite right pleural effusion. No pneumothorax. No evidence of pulmonary edema. Heart size is mildly enlarged. Upper mediastinal contours are within normal limits. IMPRESSION: 1. Chronic elevation of the left hemidiaphragm with atelectasis and/or consolidation in the left lung base, with potential small left pleural effusion. 2.  Cardiomegaly. Electronically Signed   By: Vinnie Langton M.D.   On: 06/21/2022 06:41     Patient Profile     81 y.o. male with medical history significant of hypertension, hyperlipidemia, CVA, central retinal artery occlusion, prostate caner s/p radiation, progressive dementia and prior tobacco abuse who presented after being found to be hypoxic by facility staff. Normally he was not on oxygen. His ex wife who comes to visit him regularly notes that his memory has been on the decline, but he is able to recognize her and their three children.  She is states he usually does not remember his age.   Assessment & Plan     Heart Failure with Reduced Ejection Fraction (biventricular with ischemic changes) Dementia Sepsis and PNA HTN and HLD Frequent PVCs - acute on chronic with mixed picture hypoxic respiratory failure;  - given his dementia, we will proceed with medical therapy but will defer ischemic evaluation unless having CP, see 06/21/22 notes for family discussion - PNA therapy as per primary - starting lasix 40 mg PO BID, if significant AKI again will switch to his home dose of 40 mg PO daily - increase coreg to 12.'5mg'$  PO BID today  - continue SGLT2i; will not add MRA or ANRI presently - elevated troponin likely demand but he likely has underlying CAD - K goal of 4 Mg goal of 2 with Frequent PVCs; repleting - if myalgias persistent given his long term outcomes we could consider trial off his atorvastatin    For questions or updates, please contact [facility] Please consult [email or web address] for contact info under        Signed, Werner Lean, MD  06/22/2022, 8:38 AM

## 2022-06-23 DIAGNOSIS — J9601 Acute respiratory failure with hypoxia: Secondary | ICD-10-CM | POA: Diagnosis not present

## 2022-06-23 DIAGNOSIS — I5033 Acute on chronic diastolic (congestive) heart failure: Secondary | ICD-10-CM | POA: Diagnosis not present

## 2022-06-23 DIAGNOSIS — J9602 Acute respiratory failure with hypercapnia: Secondary | ICD-10-CM | POA: Diagnosis not present

## 2022-06-23 LAB — COMPREHENSIVE METABOLIC PANEL
ALT: 14 U/L (ref 0–44)
AST: 14 U/L — ABNORMAL LOW (ref 15–41)
Albumin: 3 g/dL — ABNORMAL LOW (ref 3.5–5.0)
Alkaline Phosphatase: 44 U/L (ref 38–126)
Anion gap: 6 (ref 5–15)
BUN: 18 mg/dL (ref 8–23)
CO2: 35 mmol/L — ABNORMAL HIGH (ref 22–32)
Calcium: 8.7 mg/dL — ABNORMAL LOW (ref 8.9–10.3)
Chloride: 97 mmol/L — ABNORMAL LOW (ref 98–111)
Creatinine, Ser: 1.28 mg/dL — ABNORMAL HIGH (ref 0.61–1.24)
GFR, Estimated: 56 mL/min — ABNORMAL LOW (ref >60)
Glucose, Bld: 106 mg/dL — ABNORMAL HIGH (ref 70–99)
Potassium: 3.9 mmol/L (ref 3.5–5.1)
Sodium: 138 mmol/L (ref 135–145)
Total Bilirubin: 1.1 mg/dL (ref 0.3–1.2)
Total Protein: 6.3 g/dL — ABNORMAL LOW (ref 6.5–8.1)

## 2022-06-23 LAB — CBC WITH DIFFERENTIAL/PLATELET
Abs Immature Granulocytes: 0.02 10*3/uL (ref 0.00–0.07)
Basophils Absolute: 0 10*3/uL (ref 0.0–0.1)
Basophils Relative: 0 %
Eosinophils Absolute: 0.3 10*3/uL (ref 0.0–0.5)
Eosinophils Relative: 5 %
HCT: 45.7 % (ref 39.0–52.0)
Hemoglobin: 13.7 g/dL (ref 13.0–17.0)
Immature Granulocytes: 0 %
Lymphocytes Relative: 17 %
Lymphs Abs: 0.9 10*3/uL (ref 0.7–4.0)
MCH: 27 pg (ref 26.0–34.0)
MCHC: 30 g/dL (ref 30.0–36.0)
MCV: 90.1 fL (ref 80.0–100.0)
Monocytes Absolute: 0.9 10*3/uL (ref 0.1–1.0)
Monocytes Relative: 16 %
Neutro Abs: 3.3 10*3/uL (ref 1.7–7.7)
Neutrophils Relative %: 62 %
Platelets: 178 10*3/uL (ref 150–400)
RBC: 5.07 MIL/uL (ref 4.22–5.81)
RDW: 16.1 % — ABNORMAL HIGH (ref 11.5–15.5)
WBC: 5.4 10*3/uL (ref 4.0–10.5)
nRBC: 0 % (ref 0.0–0.2)

## 2022-06-23 LAB — CULTURE, BLOOD (ROUTINE X 2)
Culture: NO GROWTH
Culture: NO GROWTH
Special Requests: ADEQUATE

## 2022-06-23 LAB — MAGNESIUM: Magnesium: 2.2 mg/dL (ref 1.7–2.4)

## 2022-06-23 LAB — BRAIN NATRIURETIC PEPTIDE: B Natriuretic Peptide: 602.8 pg/mL — ABNORMAL HIGH (ref 0.0–100.0)

## 2022-06-23 MED ORDER — FUROSEMIDE 10 MG/ML IJ SOLN
40.0000 mg | Freq: Two times a day (BID) | INTRAMUSCULAR | Status: DC
  Administered 2022-06-23: 40 mg via INTRAVENOUS
  Filled 2022-06-23: qty 4

## 2022-06-23 NOTE — Progress Notes (Signed)
RT went to see if pt was ready to be placed on cpap for the  night, pt wasn't ready. RT told pt to call when ready.

## 2022-06-23 NOTE — Progress Notes (Signed)
Progress Note  Patient Name: Tommy Bauer Date of Encounter: 06/23/2022  [facility] Cardiologist: New to [facility]  Subjective   Patient seen this AM after taking off his O2. Resting Sats 77%.  Resolved with return to O2.  Inpatient Medications    Scheduled Meds:  aspirin EC  81 mg Oral Daily   atorvastatin  40 mg Oral QPM   carvedilol  12.5 mg Oral BID WC   cephALEXin  500 mg Oral Q12H   empagliflozin  10 mg Oral Daily   enoxaparin (LOVENOX) injection  40 mg Subcutaneous C78L   folic acid  1 mg Oral Daily   furosemide  40 mg Oral BID   hydrALAZINE  25 mg Oral Q8H   isosorbide mononitrate  15 mg Oral Daily   latanoprost  1 drop Both Eyes QHS   pantoprazole  40 mg Oral Daily   potassium chloride  20 mEq Oral BID   simethicone  120 mg Oral BID   sodium chloride flush  3 mL Intravenous Q12H   thiamine  100 mg Oral Daily   Continuous Infusions:   PRN Meds: acetaminophen **OR** acetaminophen, albuterol, butalbital-acetaminophen-caffeine   Vital Signs    Vitals:   06/22/22 2325 06/23/22 0400 06/23/22 0404 06/23/22 0536  BP: 116/85 119/68 109/77 117/73  Pulse: 76 66 66   Resp: 20 (!) 28 (!) 25   Temp: 97.6 F (36.4 C)  97.8 F (36.6 C)   TempSrc: Oral  Oral   SpO2: 93% 98% 97%   Weight:      Height:        Intake/Output Summary (Last 24 hours) at 06/23/2022 0811 Last data filed at 06/23/2022 0311 Gross per 24 hour  Intake 600 ml  Output 1300 ml  Net -700 ml      06/22/2022    5:00 AM 06/18/2022    7:50 PM 06/18/2022   11:00 AM  Last 3 Weights  Weight (lbs) 218 lb 7.6 oz 228 lb 9.9 oz 220 lb 0.3 oz  Weight (kg) 99.1 kg 103.7 kg 99.8 kg      Telemetry    SR with Frequent PVCs (NSVT listed are PVC triplets)- Personally Reviewed  Physical Exam   GEN: No acute distress.   Cardiac: RRR, no murmurs, rubs, or gallops.  Respiratory: CTAB good effort, occasional wheeze GI: Soft, nontender, non-distended  MS: non pitting edema, skin is warm Neuro:   Oriented to person and place and time Psych: Normal affect   Labs    High Sensitivity Troponin:   Recent Labs  Lab 06/18/22 1121 06/18/22 1302  TROPONINIHS 133* 134*     Chemistry Recent Labs  Lab 06/21/22 0303 06/22/22 0425 06/23/22 0344  NA 142 138 138  K 4.4 3.8 3.9  CL 95* 97* 97*  CO2 38* 33* 35*  GLUCOSE 89 95 106*  BUN '19 15 18  '$ CREATININE 1.53* 1.16 1.28*  CALCIUM 9.2 8.9 8.7*  MG 2.1 1.8 2.2  PROT 6.7 6.1* 6.3*  ALBUMIN 3.3* 3.1* 3.0*  AST 19 16 14*  ALT '19 16 14  '$ ALKPHOS 47 45 44  BILITOT 1.3* 1.6* 1.1  GFRNONAA 45* >60 56*  ANIONGAP '9 8 6    '$ Lipids  Recent Labs  Lab 06/19/22 0925  CHOL 102  TRIG 22  HDL 36*  LDLCALC 62  CHOLHDL 2.8    Hematology Recent Labs  Lab 06/21/22 0303 06/22/22 0425 06/23/22 0344  WBC 7.0 4.8 5.4  RBC 5.37 5.20 5.07  HGB 14.9 14.5 13.7  HCT 49.1 47.4 45.7  MCV 91.4 91.2 90.1  MCH 27.7 27.9 27.0  MCHC 30.3 30.6 30.0  RDW 16.9* 16.2* 16.1*  PLT 175 164 178   Thyroid No results for input(s): "TSH", "FREET4" in the last 168 hours.  BNP Recent Labs  Lab 06/21/22 0303 06/22/22 0425 06/23/22 0344  BNP 406.8* 431.3* 602.8*    DDimer No results for input(s): "DDIMER" in the last 168 hours.   Radiology    No results found.   Patient Profile     81 y.o. male with medical history significant of hypertension, hyperlipidemia, CVA, central retinal artery occlusion, prostate caner s/p radiation, progressive dementia and prior tobacco abuse who presented after being found to be hypoxic by facility staff. Normally he was not on oxygen. His ex wife who comes to visit him regularly notes that his memory has been on the decline, but he is able to recognize her and their three children.  She is states he usually does not remember his age.   Assessment & Plan     Heart Failure with Reduced Ejection Fraction (biventricular with ischemic changes) Dementia Sepsis and PNA HTN and HLD Frequent PVCs - acute on chronic  with mixed picture hypoxic respiratory failure;  - given his dementia, we will proceed with medical therapy but will defer ischemic evaluation unless having CP, see 06/21/22 notes for family discussion - PNA therapy as per primary - concerned that we may have been unmasking CKD; given his level of hypoxia will return to IV diuretics - Coreg to 12.'5mg'$  PO BID - continue SGLT2i; will not add MRA or ANRI presently but may attempt in future when creatinine is stable - elevated troponin likely demand but he likely has underlying CAD - K goal of 4 Mg goal of 2  - if myalgias persistent given his long term outcomes we could consider trial off his atorvastatin    For questions or updates, please contact [facility] Please consult [email or web address] for contact info under        Signed, Werner Lean, MD  06/23/2022, 8:11 AM

## 2022-06-23 NOTE — Progress Notes (Signed)
PROGRESS NOTE                                                                                                                                                                                                             Patient Demographics:    Tommy Bauer, is a 81 y.o. male, DOB - [birth year], UKG:[id number]  Outpatient Primary MD for the patient is Garwin Brothers, MD    LOS - 5  Admit date - 06/18/2022    Chief Complaint  Patient presents with   Shortness of Breath       Brief Narrative (HPI from H&P)     81 y.o. male with medical history significant of hypertension, hyperlipidemia, CVA, central retinal artery occlusion, prostate caner s/p radiation, progressive dementia and prior tobacco abuse who presented after being found to be hypoxic by facility staff. Normally he was not on oxygen. His ex wife who comes to visit him regularly notes that his memory has been on the decline, but he is able to recognize her and their three children.  She is states he usually does not remember his age and currently cannot tell me why he is here.  He sustained a wound on the right side of his head after being hospitalized for abscess and cellulitis of the scalp last year.  Ever since that time he has been at the [place] care.   His work-up was consistent with pneumonia and CHF, he was seen by cardiology and admitted to the hospital.   Subjective:   He denies any headache today, no nausea, no vomiting   Assessment  & Plan :    Acute hypoxic respiratory failure secondary to pneumonia and acute on chronic combined CHF.  He had subjective fevers, leukocytosis, elevated procalcitonin along with CT evidence of possible right lower lobe pneumonia.  His work-up is also consistent with fluid overload and CHF decompensation. -Treated initially with IV antibiotics, improving, he is to transition to oral regimen currently . - speech therapy to  evaluate to rule out ongoing aspiration, feeding assistance and aspiration precautions, diuretics as tolerated.  Continue oxygen supplementation and supportive care.  Encouraged to sit up in chair in the daytime use I-S and flutter valve for pulmonary toiletry.    Acute on chronic combined systolic and diastolic CHF.   -Last known EF 55%, EF has  not dropped to 35% with wall motion abnormalities, cardiology is following, currently on diuretics, which include Jardiance, Lasix and Aldactone, on hydralazine, heart rate stable we will try and add low-dose Coreg.  Cardiology on board defer further management or work-up to cardiology.   -Remains with significant oxygen requirement, mildly tachypneic and BNP is trending up, so he is transition back to IV Lasix 40 mg IV twice daily   Non-ACS pattern elevation of troponin.  Trend is flat likely due to demand mismatch caused by hypoxia from #1 above.  Continue aspirin, low-dose beta-blocker if tolerated by blood pressure and heart rate, continue statin for secondary prevention.  Gradually progressive dementia.  At risk for delirium.  Minimize narcotics and benzodiazepines.  PT, OT.  Post discharge go back to SNF.  Dyslipidemia.  On statin.  Hx of stroke.  Continue combination of aspirin and statin for secondary prevention.  History of prostate cancer.  Patient s/p radiation treatment about 11 years ago now thought to be in remission.  Outpatient follow-up by PCP.  CKD stage III A.  Baseline creatinine around 1.2.  Monitor.  Headache resolved after receiving Fioricet      Condition - Extremely Guarded  Family Communication  :  None present  Called ex wife Camie on 06/19/2022    Code Status :  Full  Consults  :  Cards  PUD Prophylaxis : PPI   Procedures  :     TTE -  1. Left ventricular ejection fraction, by estimation, is 30 to 35%. The left ventricle has moderately decreased function. The left ventricle demonstrates regional wall motion  abnormalities (see scoring diagram/findings for description). Indeterminate diastolic filling due to E-A fusion. There is severe hypokinesis of the left ventricular, entire inferoseptal wall and inferior wall.  2. Right ventricular systolic function is severely reduced. The right ventricular size is severely enlarged. Tricuspid regurgitation signal is inadequate for assessing PA pressure.  3. Left atrial size was mildly dilated.  4. Right atrial size was mildly dilated.  5. The mitral valve is normal in structure. Trivial mitral valve regurgitation.  6. The aortic valve is tricuspid. There is mild calcification of the aortic valve. Aortic valve regurgitation is not visualized. Aortic valve sclerosis/calcification is present, without any evidence of aortic stenosis.  7. The inferior vena cava is dilated in size with <50% respiratory variability, suggesting right atrial pressure of 15 mmHg.  CTA - 1. No pulmonary embolus. 2. Ill-defined opacity in the medial aspect of the right lower lobe may be atelectasis or pneumonia. 3. Moderate multi chamber cardiomegaly with coronary artery calcifications. 4. Dilated main pulmonary artery suggesting pulmonary arterial hypertension. 5. Mild multifocal mediastinal and hilar adenopathy and shotty lymph nodes, likely reactive. 6. Elevated left hemidiaphragm with adjacent compressive atelectasis in the lingula and left lower lobe. Aortic Atherosclerosis.      Disposition Plan  :    Status is: Inpatient  DVT Prophylaxis  :    Place TED hose Start: 06/21/22 0556 Place TED hose Start: 06/19/22 0832 enoxaparin (LOVENOX) injection 40 mg Start: 06/18/22 1800    Lab Results  Component Value Date   PLT 178 06/23/2022    Diet :  Diet Order             Diet Heart Room service appropriate? Yes; Fluid consistency: Thin; Fluid restriction: 1500 mL Fluid  Diet effective now                    Inpatient Medications  Scheduled Meds:  aspirin EC  81 mg Oral Daily    atorvastatin  40 mg Oral QPM   carvedilol  12.5 mg Oral BID WC   empagliflozin  10 mg Oral Daily   enoxaparin (LOVENOX) injection  40 mg Subcutaneous G29B   folic acid  1 mg Oral Daily   furosemide  40 mg Intravenous BID   hydrALAZINE  25 mg Oral Q8H   isosorbide mononitrate  15 mg Oral Daily   latanoprost  1 drop Both Eyes QHS   pantoprazole  40 mg Oral Daily   potassium chloride  20 mEq Oral BID   simethicone  120 mg Oral BID   sodium chloride flush  3 mL Intravenous Q12H   thiamine  100 mg Oral Daily   Continuous Infusions:   PRN Meds:.acetaminophen **OR** acetaminophen, albuterol, butalbital-acetaminophen-caffeine  Time Spent in minutes  30   Phillips Climes M.D on 06/23/2022 at 2:23 PM  To page go to [email or web address]   [facility] -  Office  [phone]  See all Orders from today for further details    Objective:   Vitals:   06/23/22 0404 06/23/22 0536 06/23/22 0800 06/23/22 1200  BP: 109/77 117/73 121/77 107/72  Pulse: 66  66 66  Resp: (!) 25  20 (!) 25  Temp: 97.8 F (36.6 C)  98.2 F (36.8 C) 98 F (36.7 C)  TempSrc: Oral  Oral Oral  SpO2: 97%  94% 97%  Weight:      Height:        Wt Readings from Last 3 Encounters:  06/22/22 99.1 kg  09/20/21 99.8 kg  02/12/21 102.1 kg     Intake/Output Summary (Last 24 hours) at 06/23/2022 1423 Last data filed at 06/23/2022 1145 Gross per 24 hour  Intake 240 ml  Output 2300 ml  Net -2060 ml     Physical Exam \ Awake Alert, frail, deconditioned Symmetrical Chest wall movement, diminished air entry at the bases RRR,No Gallops,Rubs or new Murmurs, No Parasternal Heave +ve B.Sounds, Abd Soft, No tenderness, No rebound - guarding or rigidity. No Cyanosis, Clubbing or edema, No new Rash or bruise      Data Review:    CBC Recent Labs  Lab 06/18/22 1351 06/18/22 1748 06/20/22 0253 06/21/22 0303 06/22/22 0425 06/23/22 0344  WBC 12.9*  --  10.1 7.0 4.8 5.4  HGB 14.9 16.7 15.3 14.9 14.5 13.7   HCT 49.7 49.0 51.1 49.1 47.4 45.7  PLT 182  --  190 175 164 178  MCV 91.4  --  91.3 91.4 91.2 90.1  MCH 27.4  --  27.3 27.7 27.9 27.0  MCHC 30.0  --  29.9* 30.3 30.6 30.0  RDW 17.4*  --  17.6* 16.9* 16.2* 16.1*  LYMPHSABS 0.3*  --  0.8 0.9 0.7 0.9  MONOABS 0.5  --  1.0 1.0 0.7 0.9  EOSABS 0.0  --  0.2 0.3 0.2 0.3  BASOSABS 0.0  --  0.0 0.0 0.0 0.0    Electrolytes Recent Labs  Lab 06/18/22 1826 06/18/22 2008 06/18/22 2011 06/18/22 2225 06/19/22 0925 06/20/22 0253 06/21/22 0303 06/22/22 0425 06/23/22 0344  NA  --   --   --   --  143 142 142 138 138  K  --   --   --   --  4.9 4.2 4.4 3.8 3.9  CL  --   --   --   --  104 99 95* 97* 97*  CO2  --   --   --   --  30 33* 38* 33* 35*  GLUCOSE  --   --   --   --  83 91 89 95 106*  BUN  --   --   --   --  '18 17 19 15 18  '$ CREATININE  --   --   --   --  1.14 1.10 1.53* 1.16 1.28*  CALCIUM  --   --   --   --  8.9 9.1 9.2 8.9 8.7*  AST  --   --   --   --   --  '25 19 16 '$ 14*  ALT  --   --   --   --   --  '22 19 16 14  '$ ALKPHOS  --   --   --   --   --  51 47 45 44  BILITOT  --   --   --   --   --  1.3* 1.3* 1.6* 1.1  ALBUMIN  --   --   --   --   --  3.6 3.3* 3.1* 3.0*  MG  --  1.9  --   --   --  2.1 2.1 1.8 2.2  PROCALCITON 6.86  --   --   --   --   --   --   --   --   LATICACIDVEN 4.7*  --  4.3*  --   --   --   --   --   --   BNP  --   --   --  1,915.9*  --  554.9* 406.8* 431.3* 602.8*    ------------------------------------------------------------------------------------------------------------------ No results for input(s): "CHOL", "HDL", "LDLCALC", "TRIG", "CHOLHDL", "LDLDIRECT" in the last 72 hours.   No results found for: "HGBA1C"  No results for input(s): "TSH", "T4TOTAL", "T3FREE", "THYROIDAB" in the last 72 hours.  Invalid input(s): "FREET3" ------------------------------------------------------------------------------------------------------------------ ID Labs Recent Labs  Lab 06/18/22 1351 06/18/22 1826  06/18/22 2011 06/19/22 0925 06/20/22 0253 06/21/22 0303 06/22/22 0425 06/23/22 0344  WBC 12.9*  --   --   --  10.1 7.0 4.8 5.4  PLT 182  --   --   --  190 175 164 178  PROCALCITON  --  6.86  --   --   --   --   --   --   LATICACIDVEN  --  4.7* 4.3*  --   --   --   --   --   CREATININE 1.24  --   --  1.14 1.10 1.53* 1.16 1.28*   Cardiac Enzymes No results for input(s): "CKMB", "TROPONINI", "MYOGLOBIN" in the last 168 hours.  Invalid input(s): "CK"  Radiology Reports DG Chest Port 1 View  Result Date: 06/21/2022 CLINICAL DATA:  81 year old male with history of acute respiratory failure with hypoxia and hypercapnia. EXAM: PORTABLE CHEST 1 VIEW COMPARISON:  Chest x-ray 06/18/2022. FINDINGS: Elevation of the left hemidiaphragm. Opacity at the left base which may reflect atelectasis and/or consolidation. Possible small left pleural effusion. No definite right pleural effusion. No pneumothorax. No evidence of pulmonary edema. Heart size is mildly enlarged. Upper mediastinal contours are within normal limits. IMPRESSION: 1. Chronic elevation of the left hemidiaphragm with atelectasis and/or consolidation in the left lung base, with potential small left pleural effusion. 2. Cardiomegaly. Electronically Signed   By: Vinnie Langton M.D.   On: 06/21/2022 06:41

## 2022-06-23 NOTE — TOC Initial Note (Signed)
Transition of Care [name]) - Initial/Assessment Note    Patient Details  Name: Tommy Bauer MRN: [id number] Date of Birth: [birth year]  [facility] CM/SW Contact:    Coralee Pesa, [place] Phone Number: 06/23/2022, 9:55 AM  Clinical Narrative:                 CSW spoke with Tommy Bauer, pt's ex wife, who is currently his POA. She states daughter is not able to be his primary contact.She confirms pt is from LTC at [facility] and has no concerns about him returning at discharge. Pt will need PTAR transport. CSW followed up with [facility] who confirmed no barriers to pt returning. TOC will continue to follow for DC needs.  Expected Discharge Plan: Skilled Nursing Facility Barriers to Discharge: Continued Medical Work up   Patient Goals and CMS Choice Patient states their goals for this hospitalization and ongoing recovery are:: Pt disoriented and unable to participate in goal setting at this time. CMS Medicare.gov Compare Post Acute Care list provided to:: Patient Represenative (must comment) Tommy Bauer (Spouse)) Choice offered to / list presented to : Spouse  Expected Discharge Plan and Services Expected Discharge Plan: [place] Choice: [place] Living arrangements for the past 2 months: [place]                                      Prior Living Arrangements/Services Living arrangements for the past 2 months: [place] Lives with:: Facility Resident Patient language and need for interpreter reviewed:: Yes Do you feel safe going back to the place where you live?: Yes      Need for Family Participation in Patient Care: Yes (Comment) Care giver support system in place?: Yes (comment)   Criminal Activity/Legal Involvement Pertinent to Current Situation/Hospitalization: No - Comment as needed  Activities of Daily Living      Permission Sought/Granted Permission sought to share information  with : Family Supports Permission granted to share information with : Yes, Verbal Permission Granted  Share Information with NAME: Tommy Bauer     Permission granted to share info w Relationship: Spouse  Permission granted to share info w Contact Information: [phone]  Emotional Assessment Appearance:: Appears stated age Attitude/Demeanor/Rapport: Unable to Assess Affect (typically observed): Unable to Assess Orientation: : Oriented to Self Alcohol / Substance Use: Not Applicable Psych Involvement: No (comment)  Admission diagnosis:  Acute respiratory failure with hypoxia ([place]) [J96.01] Patient Active Problem List   Diagnosis Date Noted   Acute on chronic diastolic heart failure ([place])    Acute respiratory failure with hypoxia and hypercapnia ([place]) 06/18/2022   (HFpEF) heart failure with preserved ejection fraction ([place]) 06/18/2022   Elevated troponin 06/18/2022   Cerebrovascular disease 09/20/2021   PICC (peripherally inserted central catheter) in place 02/24/2021   Scalp wound 02/02/2021   Abscess or cellulitis of scalp 02/02/2021   Osteomyelitis ([place]) 02/02/2021   Bradycardia 02/02/2021   Hypoxia, sleep related 02/02/2021   Abnormal EKG 02/02/2021   Hyperlipidemia 02/02/2021   History of stroke 02/02/2021   Atrioventricular block, Mobitz type 1, Wenckebach    Dementia without behavioral disturbance ([place]) 12/14/2020   Gait abnormality 12/14/2020   Abnormal EEG 12/14/2020   Hypothermia 09/17/2020   Sepsis due to pneumonia ([place]) 09/13/2020   AMS (altered mental status) 09/13/2020   AKI (acute kidney injury) ([place]) 09/13/2020  Conjunctivitis 09/13/2020   Rhabdomyolysis 09/13/2020   Central retinal artery occlusion 07/15/2015   81 year-old gentleman s/p radical prostatectomy for pT3a adenocarcinoma with a Gleason's Score of 4+3, with extracapsular extension noted on pathology, and rising PSA of 0.36 05/19/2010   TOBACCO ABUSE 11/06/2009   CARPAL TUNNEL SYNDROME 10/15/2008    BACK PAIN 07/16/2008   INGUINAL HERNIA 12/04/2007   Cardiovascular disease 11/12/2007   ERECTILE DYSFUNCTION, ORGANIC 07/11/2007   HEADACHE 07/11/2007   Essential hypertension 06/08/2007   History of colonic polyps 06/08/2007   PCP:  Tommy Brothers, MD Pharmacy:   [facility] [place] [address] [facility] [address] [place] [address] Phone: [phone] Fax: [phone]     Social Determinants of Health (SDOH) Interventions    Readmission Risk Interventions     No data to display

## 2022-06-23 NOTE — Plan of Care (Signed)

## 2022-06-23 NOTE — Progress Notes (Signed)
Physical Therapy Treatment Patient Details Name: Tommy Bauer MRN: [id number] DOB: [birth year] Today's Date: 06/23/2022   History of Present Illness 81 yo male presents from [place] with ShOB, lethargy due to acute respiratory failure and possible sepsis secondary to RLL PNA. Ptalso with HF exacerbation. PMH includes hypertension, previous CVA, hyperlipidemia, dementia, central retinal occlusion.    PT Comments    Pt requires mod encouragement to mobilize today, states his body does not want to participate. Pt requires very increased time and effort to perform bed mobility and transfer OOB, pt complaining of scrotal pain and generalized weakness as reason for difficulty. However, pt with improved processing time and orientation today and joked with PT throughout session. VSS on 2LO2 throughout session.      Recommendations for follow up therapy are one component of a multi-disciplinary discharge planning process, led by the attending physician.  Recommendations may be updated based on patient status, additional functional criteria and insurance authorization.  Follow Up Recommendations  Skilled nursing-short term rehab (<3 hours/day) Can patient physically be transported by private vehicle: Yes   Assistance Recommended at Discharge Frequent or constant Supervision/Assistance  Patient can return home with the following A little help with walking and/or transfers;A little help with bathing/dressing/bathroom   Equipment Recommendations       Recommendations for Other Services       Precautions / Restrictions Precautions Precautions: Fall Precaution Comments: on 2L02 Restrictions Weight Bearing Restrictions: No     Mobility  Bed Mobility Overal bed mobility: Needs Assistance Bed Mobility: Supine to Sit, Sit to Supine     Supine to sit: Mod assist     General bed mobility comments: assist for scooting to EOB, trunk elevation, VERY increased time and multiple  stop/starts requiring mod encouragement to continue.    Transfers Overall transfer level: Needs assistance Equipment used: Rolling Stegmaier (2 wheels) Transfers: Sit to/from Stand Sit to Stand: Min assist   Step pivot transfers: Min assist       General transfer comment: assist for power up, rise, steadying, and pivotal steps to reach recliner. Pericare once standing performed by RN given pt BM - standing tolerance x5 minutes.    Ambulation/Gait                   Stairs             Wheelchair Mobility    Modified Rankin (Stroke Patients Only)       Balance Overall balance assessment: Needs assistance   Sitting balance-Leahy Scale: Fair     Standing balance support: Bilateral upper extremity supported, During functional activity, Reliant on assistive device for balance Standing balance-Leahy Scale: Poor                              Cognition Arousal/Alertness: Awake/alert Behavior During Therapy: WFL for tasks assessed/performed Overall Cognitive Status: History of cognitive impairments - at baseline                                 General Comments: history of dementia, but oriented to self and location. Pt with good sense of humor throughout session        Exercises General Exercises - Lower Extremity Long Arc Quad: AROM, Both, 15 reps, Seated Hip Flexion/Marching: AROM, Both, 15 reps, Seated    General Comments General comments (skin integrity, edema, etc.): vss on  2LO2      Pertinent Vitals/Pain Pain Assessment Pain Assessment: Faces Faces Pain Scale: Hurts little more Pain Location: scrotum during bed mobility Pain Descriptors / Indicators: Grimacing, Guarding Pain Intervention(s): Limited activity within patient's tolerance, Monitored during session, Repositioned    Home Living                          Prior Function            PT Goals (current goals can now be found in the care plan section)  Acute Rehab PT Goals PT Goal Formulation: With patient Time For Goal Achievement: 07/04/22 Potential to Achieve Goals: Good Progress towards PT goals: Progressing toward goals    Frequency    Min 2X/week      PT Plan Current plan remains appropriate    Co-evaluation              AM-PAC PT "6 Clicks" Mobility   Outcome Measure  Help needed turning from your back to your side while in a flat bed without using bedrails?: A Little Help needed moving from lying on your back to sitting on the side of a flat bed without using bedrails?: A Lot Help needed moving to and from a bed to a chair (including a wheelchair)?: A Lot Help needed standing up from a chair using your arms (e.g., wheelchair or bedside chair)?: A Little Help needed to walk in hospital room?: A Lot Help needed climbing 3-5 steps with a railing? : A Lot 6 Click Score: 14    End of Session Equipment Utilized During Treatment: Oxygen Activity Tolerance: Patient tolerated treatment well;Patient limited by fatigue Patient left: with call bell/phone within reach;in chair;with chair alarm set;with nursing/sitter in room Nurse Communication: Mobility status PT Visit Diagnosis: Unsteadiness on feet (R26.81);Muscle weakness (generalized) (M62.81)     Time: 2130-8657 PT Time Calculation (min) (ACUTE ONLY): 30 min  Charges:  $Therapeutic Activity: 8-22 mins $Neuromuscular Re-education: 8-22 mins                     Stacie Glaze, PT DPT Acute Rehabilitation Services Pager [phone]  Office [phone]    [name] 06/23/2022, 4:29 PM

## 2022-06-24 ENCOUNTER — Inpatient Hospital Stay (HOSPITAL_COMMUNITY): Payer: Medicare Other

## 2022-06-24 DIAGNOSIS — I5023 Acute on chronic systolic (congestive) heart failure: Secondary | ICD-10-CM

## 2022-06-24 DIAGNOSIS — J9601 Acute respiratory failure with hypoxia: Secondary | ICD-10-CM | POA: Diagnosis not present

## 2022-06-24 DIAGNOSIS — J189 Pneumonia, unspecified organism: Secondary | ICD-10-CM | POA: Diagnosis not present

## 2022-06-24 DIAGNOSIS — J9602 Acute respiratory failure with hypercapnia: Secondary | ICD-10-CM | POA: Diagnosis not present

## 2022-06-24 LAB — CBC WITH DIFFERENTIAL/PLATELET
Abs Immature Granulocytes: 0.03 10*3/uL (ref 0.00–0.07)
Basophils Absolute: 0 10*3/uL (ref 0.0–0.1)
Basophils Relative: 0 %
Eosinophils Absolute: 0.3 10*3/uL (ref 0.0–0.5)
Eosinophils Relative: 6 %
HCT: 47.3 % (ref 39.0–52.0)
Hemoglobin: 14.4 g/dL (ref 13.0–17.0)
Immature Granulocytes: 1 %
Lymphocytes Relative: 18 %
Lymphs Abs: 1.1 10*3/uL (ref 0.7–4.0)
MCH: 27.9 pg (ref 26.0–34.0)
MCHC: 30.4 g/dL (ref 30.0–36.0)
MCV: 91.5 fL (ref 80.0–100.0)
Monocytes Absolute: 0.8 10*3/uL (ref 0.1–1.0)
Monocytes Relative: 13 %
Neutro Abs: 3.7 10*3/uL (ref 1.7–7.7)
Neutrophils Relative %: 62 %
Platelets: 168 10*3/uL (ref 150–400)
RBC: 5.17 MIL/uL (ref 4.22–5.81)
RDW: 16.1 % — ABNORMAL HIGH (ref 11.5–15.5)
WBC: 6 10*3/uL (ref 4.0–10.5)
nRBC: 0 % (ref 0.0–0.2)

## 2022-06-24 LAB — COMPREHENSIVE METABOLIC PANEL
ALT: 14 U/L (ref 0–44)
AST: 18 U/L (ref 15–41)
Albumin: 3.2 g/dL — ABNORMAL LOW (ref 3.5–5.0)
Alkaline Phosphatase: 47 U/L (ref 38–126)
Anion gap: 7 (ref 5–15)
BUN: 19 mg/dL (ref 8–23)
CO2: 39 mmol/L — ABNORMAL HIGH (ref 22–32)
Calcium: 9 mg/dL (ref 8.9–10.3)
Chloride: 97 mmol/L — ABNORMAL LOW (ref 98–111)
Creatinine, Ser: 1.51 mg/dL — ABNORMAL HIGH (ref 0.61–1.24)
GFR, Estimated: 46 mL/min — ABNORMAL LOW (ref >60)
Glucose, Bld: 92 mg/dL (ref 70–99)
Potassium: 4.6 mmol/L (ref 3.5–5.1)
Sodium: 143 mmol/L (ref 135–145)
Total Bilirubin: 0.8 mg/dL (ref 0.3–1.2)
Total Protein: 6.6 g/dL (ref 6.5–8.1)

## 2022-06-24 LAB — MAGNESIUM: Magnesium: 2.2 mg/dL (ref 1.7–2.4)

## 2022-06-24 MED ORDER — FUROSEMIDE 10 MG/ML IJ SOLN
40.0000 mg | Freq: Two times a day (BID) | INTRAMUSCULAR | Status: DC
  Administered 2022-06-24 (×2): 40 mg via INTRAVENOUS
  Filled 2022-06-24 (×2): qty 4

## 2022-06-24 NOTE — Progress Notes (Signed)
PROGRESS NOTE                                                                                                                                                                                                             Patient Demographics:    Tommy Bauer, is a 81 y.o. male, DOB - 1941-01-29, FGH:[id number]  Outpatient Primary MD for the patient is Garwin Brothers, MD    LOS - 6  Admit date - 06/18/2022    Chief Complaint  Patient presents with   Shortness of Breath       Brief Narrative (HPI from H&P)     81 y.o. male with medical history significant of hypertension, hyperlipidemia, CVA, central retinal artery occlusion, prostate caner s/p radiation, progressive dementia and prior tobacco abuse who presented after being found to be hypoxic by facility staff. Normally he was not on oxygen. His ex wife who comes to visit him regularly notes that his memory has been on the decline, but he is able to recognize her and their three children.  She is states he usually does not remember his age and currently cannot tell me why he is here.  He sustained a wound on the right side of his head after being hospitalized for abscess and cellulitis of the scalp last year.  Ever since that time he has been at the [facility].   His work-up was consistent with pneumonia and CHF, he was seen by cardiology and admitted to the hospital.   Subjective:   He denies any headache today, no nausea, no vomiting   Assessment  & Plan :    Acute hypoxic respiratory failure secondary to pneumonia and acute on chronic combined CHF.  - He had subjective fevers, leukocytosis, elevated procalcitonin along with CT evidence of possible right lower lobe pneumonia.  -  His work-up is also consistent with fluid overload and CHF decompensation. -Treated initially with IV antibiotics, improving, he is transitioned to oral regimen currently . - seen by SLP.   Encouraged to sit up in chair in the daytime use I-S and flutter valve for pulmonary toiletry. -As well he remains with poor inspiratory effort, and left elevated hemidiaphragm which contributing to his hypoxia and oxygen requirement  Acute on chronic combined systolic and diastolic CHF.   -Last known EF 55%, EF has now  dropped to 35% with wall motion abnormalities, cardiology is following -Diuresis per cardiology, remains on IV Lasix 40 mg IV twice daily, may need to tolerate a new baseline creatinine for optimal volume control to help with his hypoxia and oxygen requirement   Non-ACS pattern elevation of troponin.  Trend is flat likely due to demand mismatch caused by hypoxia from #1 above.  Continue aspirin, low-dose beta-blocker if tolerated by blood pressure and heart rate, continue statin for secondary prevention.  Gradually progressive dementia.  At risk for delirium.  Minimize narcotics and benzodiazepines.  PT, OT.  Post discharge go back to SNF.  Dyslipidemia.  On statin.  Hx of stroke.  Continue combination of aspirin and statin for secondary prevention.  History of prostate cancer.  Patient s/p radiation treatment about 11 years ago now thought to be in remission.  Outpatient follow-up by PCP.  CKD stage III A.  Baseline creatinine around 1.2.  Monitor.  Headache resolved after receiving Fioricet      Condition - Extremely Guarded  Family Communication  :  None present  Called ex wife Camie on 06/19/2022    Code Status :  Full  Consults  :  Cards  PUD Prophylaxis : PPI   Procedures  :     TTE -  1. Left ventricular ejection fraction, by estimation, is 30 to 35%. The left ventricle has moderately decreased function. The left ventricle demonstrates regional wall motion abnormalities (see scoring diagram/findings for description). Indeterminate diastolic filling due to E-A fusion. There is severe hypokinesis of the left ventricular, entire inferoseptal wall and inferior  wall.  2. Right ventricular systolic function is severely reduced. The right ventricular size is severely enlarged. Tricuspid regurgitation signal is inadequate for assessing PA pressure.  3. Left atrial size was mildly dilated.  4. Right atrial size was mildly dilated.  5. The mitral valve is normal in structure. Trivial mitral valve regurgitation.  6. The aortic valve is tricuspid. There is mild calcification of the aortic valve. Aortic valve regurgitation is not visualized. Aortic valve sclerosis/calcification is present, without any evidence of aortic stenosis.  7. The inferior vena cava is dilated in size with <50% respiratory variability, suggesting right atrial pressure of 15 mmHg.  CTA - 1. No pulmonary embolus. 2. Ill-defined opacity in the medial aspect of the right lower lobe may be atelectasis or pneumonia. 3. Moderate multi chamber cardiomegaly with coronary artery calcifications. 4. Dilated main pulmonary artery suggesting pulmonary arterial hypertension. 5. Mild multifocal mediastinal and hilar adenopathy and shotty lymph nodes, likely reactive. 6. Elevated left hemidiaphragm with adjacent compressive atelectasis in the lingula and left lower lobe. Aortic Atherosclerosis.      Disposition Plan  :    Status is: Inpatient  DVT Prophylaxis  :    Place TED hose Start: 06/21/22 0556 Place TED hose Start: 06/19/22 0832 enoxaparin (LOVENOX) injection 40 mg Start: 06/18/22 1800    Lab Results  Component Value Date   PLT 168 06/24/2022    Diet :  Diet Order             Diet Heart Room service appropriate? Yes; Fluid consistency: Thin; Fluid restriction: 1500 mL Fluid  Diet effective now                    Inpatient Medications  Scheduled Meds:  aspirin EC  81 mg Oral Daily   atorvastatin  40 mg Oral QPM   carvedilol  12.5 mg Oral BID WC  empagliflozin  10 mg Oral Daily   enoxaparin (LOVENOX) injection  40 mg Subcutaneous Z36U   folic acid  1 mg Oral Daily    furosemide  40 mg Intravenous BID   hydrALAZINE  25 mg Oral Q8H   isosorbide mononitrate  15 mg Oral Daily   latanoprost  1 drop Both Eyes QHS   pantoprazole  40 mg Oral Daily   potassium chloride  20 mEq Oral BID   simethicone  120 mg Oral BID   sodium chloride flush  3 mL Intravenous Q12H   thiamine  100 mg Oral Daily   Continuous Infusions:   PRN Meds:.acetaminophen **OR** acetaminophen, albuterol, butalbital-acetaminophen-caffeine  Time Spent in minutes  30   Phillips Climes M.D on 06/24/2022 at 12:32 PM  To page go to [email or web address]   [facility] -  Office  [phone]  See all Orders from today for further details    Objective:   Vitals:   06/24/22 0800 06/24/22 0900 06/24/22 0951 06/24/22 1100  BP: (!) 150/93     Pulse: 65 66 70 71  Resp: 20 (!) 22 20 (!) 24  Temp: 98.3 F (36.8 C)     TempSrc: Oral     SpO2: 93% 90% 91% 100%  Weight:      Height:        Wt Readings from Last 3 Encounters:  06/24/22 96.8 kg  09/20/21 99.8 kg  02/12/21 102.1 kg     Intake/Output Summary (Last 24 hours) at 06/24/2022 1232 Last data filed at 06/24/2022 1000 Gross per 24 hour  Intake --  Output 2300 ml  Net -2300 ml     Physical Exam  Awake Alert, frail, deconditioned Symmetrical Chest wall movement, diminished air entry at the bases RRR,No Gallops,Rubs or new Murmurs, No Parasternal Heave +ve B.Sounds, Abd Soft, No tenderness, No rebound - guarding or rigidity. No Cyanosis, Clubbing or edema, No new Rash or bruise       Data Review:    CBC Recent Labs  Lab 06/20/22 0253 06/21/22 0303 06/22/22 0425 06/23/22 0344 06/24/22 0444  WBC 10.1 7.0 4.8 5.4 6.0  HGB 15.3 14.9 14.5 13.7 14.4  HCT 51.1 49.1 47.4 45.7 47.3  PLT 190 175 164 178 168  MCV 91.3 91.4 91.2 90.1 91.5  MCH 27.3 27.7 27.9 27.0 27.9  MCHC 29.9* 30.3 30.6 30.0 30.4  RDW 17.6* 16.9* 16.2* 16.1* 16.1*  LYMPHSABS 0.8 0.9 0.7 0.9 1.1  MONOABS 1.0 1.0 0.7 0.9 0.8  EOSABS 0.2 0.3 0.2  0.3 0.3  BASOSABS 0.0 0.0 0.0 0.0 0.0    Electrolytes Recent Labs  Lab 06/18/22 1826 06/18/22 2008 06/18/22 2011 06/18/22 2225 06/19/22 0925 06/20/22 0253 06/21/22 0303 06/22/22 0425 06/23/22 0344 06/24/22 0444  NA  --   --   --   --    < > 142 142 138 138 143  K  --   --   --   --    < > 4.2 4.4 3.8 3.9 4.6  CL  --   --   --   --    < > 99 95* 97* 97* 97*  CO2  --   --   --   --    < > 33* 38* 33* 35* 39*  GLUCOSE  --   --   --   --    < > 91 89 95 106* 92  BUN  --   --   --   --    < >  $'17 19 15 18 19  'B$ CREATININE  --   --   --   --    < > 1.10 1.53* 1.16 1.28* 1.51*  CALCIUM  --   --   --   --    < > 9.1 9.2 8.9 8.7* 9.0  AST  --   --   --   --   --  '25 19 16 '$ 14* 18  ALT  --   --   --   --   --  '22 19 16 14 14  '$ ALKPHOS  --   --   --   --   --  51 47 45 44 47  BILITOT  --   --   --   --   --  1.3* 1.3* 1.6* 1.1 0.8  ALBUMIN  --   --   --   --   --  3.6 3.3* 3.1* 3.0* 3.2*  MG  --    < >  --   --   --  2.1 2.1 1.8 2.2 2.2  PROCALCITON 6.86  --   --   --   --   --   --   --   --   --   LATICACIDVEN 4.7*  --  4.3*  --   --   --   --   --   --   --   BNP  --   --   --  1,915.9*  --  554.9* 406.8* 431.3* 602.8*  --    < > = values in this interval not displayed.    ------------------------------------------------------------------------------------------------------------------ No results for input(s): "CHOL", "HDL", "LDLCALC", "TRIG", "CHOLHDL", "LDLDIRECT" in the last 72 hours.   No results found for: "HGBA1C"  No results for input(s): "TSH", "T4TOTAL", "T3FREE", "THYROIDAB" in the last 72 hours.  Invalid input(s): "FREET3" ------------------------------------------------------------------------------------------------------------------ ID Labs Recent Labs  Lab 06/18/22 1826 06/18/22 2011 06/19/22 4818 06/20/22 0253 06/21/22 0303 06/22/22 0425 06/23/22 0344 06/24/22 0444  WBC  --   --   --  10.1 7.0 4.8 5.4 6.0  PLT  --   --   --  190 175 164 178 168   PROCALCITON 6.86  --   --   --   --   --   --   --   LATICACIDVEN 4.7* 4.3*  --   --   --   --   --   --   CREATININE  --   --    < > 1.10 1.53* 1.16 1.28* 1.51*   < > = values in this interval not displayed.   Cardiac Enzymes No results for input(s): "CKMB", "TROPONINI", "MYOGLOBIN" in the last 168 hours.  Invalid input(s): "CK"  Radiology Reports DG Chest Port 1 View  Result Date: 06/24/2022 CLINICAL DATA:  Hypoxia. EXAM: PORTABLE CHEST 1 VIEW COMPARISON:  June 21, 2022. FINDINGS: Stable cardiomegaly. Stable elevated left hemidiaphragm is noted. Minimal bibasilar subsegmental atelectasis is noted. Bony thorax is unremarkable. IMPRESSION: Stable elevated left hemidiaphragm. Minimal bibasilar subsegmental atelectasis. Electronically Signed   By: Marijo Conception M.D.   On: 06/24/2022 08:09   DG Chest Port 1 View  Result Date: 06/21/2022 CLINICAL DATA:  81 year old male with history of acute respiratory failure with hypoxia and hypercapnia. EXAM: PORTABLE CHEST 1 VIEW COMPARISON:  Chest x-ray 06/18/2022. FINDINGS: Elevation of the left hemidiaphragm. Opacity at the left base which may reflect atelectasis and/or consolidation. Possible small left pleural effusion. No definite right pleural  effusion. No pneumothorax. No evidence of pulmonary edema. Heart size is mildly enlarged. Upper mediastinal contours are within normal limits. IMPRESSION: 1. Chronic elevation of the left hemidiaphragm with atelectasis and/or consolidation in the left lung base, with potential small left pleural effusion. 2. Cardiomegaly. Electronically Signed   By: Vinnie Langton M.D.   On: 06/21/2022 06:41

## 2022-06-24 NOTE — Progress Notes (Signed)
Progress Note  Patient Name: Tommy Bauer Date of Encounter: 06/24/2022  [facility] Cardiologist: New to [employer]  Subjective   Creatine has once again increased on IV lasix.   Patient notes breathing feels better.  Feels like he can take a deep breath again.  Inpatient Medications    Scheduled Meds:  aspirin EC  81 mg Oral Daily   atorvastatin  40 mg Oral QPM   carvedilol  12.5 mg Oral BID WC   empagliflozin  10 mg Oral Daily   enoxaparin (LOVENOX) injection  40 mg Subcutaneous [id number]   folic acid  1 mg Oral Daily   furosemide  40 mg Intravenous BID   hydrALAZINE  25 mg Oral Q8H   isosorbide mononitrate  15 mg Oral Daily   latanoprost  1 drop Both Eyes QHS   pantoprazole  40 mg Oral Daily   potassium chloride  20 mEq Oral BID   simethicone  120 mg Oral BID   sodium chloride flush  3 mL Intravenous Q12H   thiamine  100 mg Oral Daily   Continuous Infusions:   PRN Meds: acetaminophen **OR** acetaminophen, albuterol, butalbital-acetaminophen-caffeine   Vital Signs    Vitals:   06/24/22 0500 06/24/22 0525 06/24/22 0528 06/24/22 0529  BP:  (!) 140/79    Pulse:   70   Resp:  (!) 23 (!) 22 (!) 23  Temp:      TempSrc:      SpO2:   96% 96%  Weight: 96.8 kg     Height:        Intake/Output Summary (Last 24 hours) at 06/24/2022 0740 Last data filed at 06/23/2022 2320 Gross per 24 hour  Intake --  Output 2800 ml  Net -2800 ml      06/24/2022    5:00 AM 06/22/2022    5:00 AM 06/18/2022    7:50 PM  Last 3 Weights  Weight (lbs) 213 lb 6.5 oz 218 lb 7.6 oz 228 lb 9.9 oz  Weight (kg) 96.8 kg 99.1 kg 103.7 kg      Telemetry    SR- VTACH is atcually PVCs- Personally Reviewed  Physical Exam   GEN: No acute distress.   Cardiac: RRR, no murmurs, rubs, or gallops.  Respiratory: CTAB good effort no crackles GI: Soft, nontender, non-distended  MS: non pitting edema, skin is warm Neuro:  Oriented to person and place Psych: Normal affect   Labs    High  Sensitivity Troponin:   Recent Labs  Lab 06/18/22 1121 06/18/22 1302  TROPONINIHS 133* 134*     Chemistry Recent Labs  Lab 06/22/22 0425 06/23/22 0344 06/24/22 0444  NA 138 138 143  K 3.8 3.9 4.6  CL 97* 97* 97*  CO2 33* 35* 39*  GLUCOSE 95 106* 92  BUN '15 18 19  '$ CREATININE 1.16 1.28* 1.51*  CALCIUM 8.9 8.7* 9.0  MG 1.8 2.2 2.2  PROT 6.1* 6.3* 6.6  ALBUMIN 3.1* 3.0* 3.2*  AST 16 14* 18  ALT '16 14 14  '$ ALKPHOS 45 44 47  BILITOT 1.6* 1.1 0.8  GFRNONAA >60 56* 46*  ANIONGAP '8 6 7    '$ Lipids  Recent Labs  Lab 06/19/22 0925  CHOL 102  TRIG 22  HDL 36*  LDLCALC 62  CHOLHDL 2.8    Hematology Recent Labs  Lab 06/22/22 0425 06/23/22 0344 06/24/22 0444  WBC 4.8 5.4 6.0  RBC 5.20 5.07 5.17  HGB 14.5 13.7 14.4  HCT 47.4 45.7 47.3  MCV 91.2 90.1 91.5  MCH 27.9 27.0 27.9  MCHC 30.6 30.0 30.4  RDW 16.2* 16.1* 16.1*  PLT 164 178 168   Thyroid No results for input(s): "TSH", "FREET4" in the last 168 hours.  BNP Recent Labs  Lab 06/21/22 0303 06/22/22 0425 06/23/22 0344  BNP 406.8* 431.3* 602.8*    DDimer No results for input(s): "DDIMER" in the last 168 hours.   Radiology    No results found.   Patient Profile     81 y.o. male with medical history significant of hypertension, hyperlipidemia, CVA, central retinal artery occlusion, prostate caner s/p radiation, progressive dementia and prior tobacco abuse who presented after being found to be hypoxic by facility staff. Normally he was not on oxygen. His ex wife who comes to visit him regularly notes that his memory has been on the decline, but he is able to recognize her and their three children.  She is states he usually does not remember his age.   Assessment & Plan     Heart Failure with Reduced Ejection Fraction (biventricular with ischemic changes) Dementia Sepsis and PNA HTN and HLD Frequent PVCs - acute on chronic with mixed picture hypoxic respiratory failure;  - given his dementia, we will  proceed with medical therapy but will defer ischemic evaluation unless having CP, see 06/21/22 notes for family discussion - PNA therapy as per primary - concerned that we may have been unmasking CKD; Will continue one day of IV diuresis and potential PO transition tomorrow - Coreg to 12.'5mg'$  PO BID - continue SGLT2i; will not add MRA or ANRI this admission unless dramatic improvement in kidney function while euvolemic - K goal of 4 Mg goal of 2  - myalgias resolved, I am concerned for underlying CAD and statin is reasonable    For questions or updates, please contact [facility] Please consult [email or web address] for contact info under        Signed, Werner Lean, MD  06/24/2022, 7:40 AM

## 2022-06-25 DIAGNOSIS — J9602 Acute respiratory failure with hypercapnia: Secondary | ICD-10-CM | POA: Diagnosis not present

## 2022-06-25 DIAGNOSIS — I5030 Unspecified diastolic (congestive) heart failure: Secondary | ICD-10-CM

## 2022-06-25 DIAGNOSIS — J189 Pneumonia, unspecified organism: Secondary | ICD-10-CM | POA: Diagnosis not present

## 2022-06-25 DIAGNOSIS — J9601 Acute respiratory failure with hypoxia: Secondary | ICD-10-CM | POA: Diagnosis not present

## 2022-06-25 DIAGNOSIS — I5023 Acute on chronic systolic (congestive) heart failure: Secondary | ICD-10-CM | POA: Diagnosis not present

## 2022-06-25 LAB — BASIC METABOLIC PANEL
Anion gap: 9 (ref 5–15)
BUN: 20 mg/dL (ref 8–23)
CO2: 37 mmol/L — ABNORMAL HIGH (ref 22–32)
Calcium: 9 mg/dL (ref 8.9–10.3)
Chloride: 94 mmol/L — ABNORMAL LOW (ref 98–111)
Creatinine, Ser: 1.27 mg/dL — ABNORMAL HIGH (ref 0.61–1.24)
GFR, Estimated: 57 mL/min — ABNORMAL LOW (ref >60)
Glucose, Bld: 91 mg/dL (ref 70–99)
Potassium: 3.5 mmol/L (ref 3.5–5.1)
Sodium: 140 mmol/L (ref 135–145)

## 2022-06-25 MED ORDER — CARVEDILOL 25 MG PO TABS: 25.0000 mg | ORAL_TABLET | Freq: Two times a day (BID) | ORAL | Status: DC

## 2022-06-25 MED ORDER — FUROSEMIDE 10 MG/ML IJ SOLN
40.0000 mg | Freq: Two times a day (BID) | INTRAMUSCULAR | Status: DC
Start: 2022-06-25 — End: 2022-06-27
  Administered 2022-06-25 – 2022-06-26 (×4): 40 mg via INTRAVENOUS
  Filled 2022-06-25 (×5): qty 4

## 2022-06-25 MED ORDER — SACUBITRIL-VALSARTAN 24-26 MG PO TABS
1.0000 | ORAL_TABLET | Freq: Two times a day (BID) | ORAL | Status: DC
Start: 2022-06-25 — End: 2022-06-28
  Administered 2022-06-25 – 2022-06-28 (×7): 1 via ORAL
  Filled 2022-06-25 (×7): qty 1

## 2022-06-25 MED ORDER — SPIRONOLACTONE 12.5 MG HALF TABLET
12.5000 mg | ORAL_TABLET | Freq: Every day | ORAL | Status: DC
  Administered 2022-06-25 – 2022-06-28 (×4): 12.5 mg via ORAL
  Filled 2022-06-25 (×4): qty 1

## 2022-06-25 NOTE — Progress Notes (Signed)
Progress Note  Patient Name: Tommy Bauer Date of Encounter: 06/25/2022  [facility] Cardiologist: New to [employer]  Subjective   No distress  Inpatient Medications    Scheduled Meds:  aspirin EC  81 mg Oral Daily   atorvastatin  40 mg Oral QPM   carvedilol  12.5 mg Oral BID WC   empagliflozin  10 mg Oral Daily   enoxaparin (LOVENOX) injection  40 mg Subcutaneous G38V   folic acid  1 mg Oral Daily   furosemide  40 mg Intravenous BID   hydrALAZINE  25 mg Oral Q8H   isosorbide mononitrate  15 mg Oral Daily   latanoprost  1 drop Both Eyes QHS   pantoprazole  40 mg Oral Daily   potassium chloride  20 mEq Oral BID   simethicone  120 mg Oral BID   sodium chloride flush  3 mL Intravenous Q12H   thiamine  100 mg Oral Daily   Continuous Infusions:   PRN Meds: acetaminophen **OR** acetaminophen, albuterol, butalbital-acetaminophen-caffeine   Vital Signs    Vitals:   06/25/22 0815 06/25/22 0900 06/25/22 1000 06/25/22 1100  BP:    (!) 139/92  Pulse: 66 74 69 70  Resp: 19 (!) 24 (!) 24 (!) 23  Temp:    (!) 97.5 F (36.4 C)  TempSrc:    Oral  SpO2: 100% 100% 100% 100%  Weight:      Height:        Intake/Output Summary (Last 24 hours) at 06/25/2022 1104 Last data filed at 06/25/2022 0900 Gross per 24 hour  Intake 720 ml  Output 2925 ml  Net -2205 ml      06/24/2022    5:00 AM 06/22/2022    5:00 AM 06/18/2022    7:50 PM  Last 3 Weights  Weight (lbs) 213 lb 6.5 oz 218 lb 7.6 oz 228 lb 9.9 oz  Weight (kg) 96.8 kg 99.1 kg 103.7 kg      Telemetry    Sinus rhythm, PVCs- Personally Reviewed  Physical Exam   GEN: No acute distress.   NECK: + JVD Cardiac: RRR, no murmurs, rubs, or gallops.  Respiratory: nl wob, decreased BS in the bases GI: Soft, nontender, non-distended  MS: non pitting edema, skin is warm Neuro:  Oriented to person and place Psych: Normal affect   Labs    High Sensitivity Troponin:   Recent Labs  Lab 06/18/22 1121 06/18/22 1302   TROPONINIHS 133* 134*     Chemistry Recent Labs  Lab 06/22/22 0425 06/23/22 0344 06/24/22 0444 06/25/22 0559  NA 138 138 143 140  K 3.8 3.9 4.6 3.5  CL 97* 97* 97* 94*  CO2 33* 35* 39* 37*  GLUCOSE 95 106* 92 91  BUN '15 18 19 20  '$ CREATININE 1.16 1.28* 1.51* 1.27*  CALCIUM 8.9 8.7* 9.0 9.0  MG 1.8 2.2 2.2  --   PROT 6.1* 6.3* 6.6  --   ALBUMIN 3.1* 3.0* 3.2*  --   AST 16 14* 18  --   ALT '16 14 14  '$ --   ALKPHOS 45 44 47  --   BILITOT 1.6* 1.1 0.8  --   GFRNONAA >60 56* 46* 57*  ANIONGAP '8 6 7 9    '$ Lipids  Recent Labs  Lab 06/19/22 0925  CHOL 102  TRIG 22  HDL 36*  LDLCALC 62  CHOLHDL 2.8    Hematology Recent Labs  Lab 06/22/22 0425 06/23/22 0344 06/24/22 0444  WBC 4.8  5.4 6.0  RBC 5.20 5.07 5.17  HGB 14.5 13.7 14.4  HCT 47.4 45.7 47.3  MCV 91.2 90.1 91.5  MCH 27.9 27.0 27.9  MCHC 30.6 30.0 30.4  RDW 16.2* 16.1* 16.1*  PLT 164 178 168   Thyroid No results for input(s): "TSH", "FREET4" in the last 168 hours.  BNP Recent Labs  Lab 06/21/22 0303 06/22/22 0425 06/23/22 0344  BNP 406.8* 431.3* 602.8*    DDimer No results for input(s): "DDIMER" in the last 168 hours.   Radiology    DG Chest Port 1 View  Result Date: 06/24/2022 CLINICAL DATA:  Hypoxia. EXAM: PORTABLE CHEST 1 VIEW COMPARISON:  June 21, 2022. FINDINGS: Stable cardiomegaly. Stable elevated left hemidiaphragm is noted. Minimal bibasilar subsegmental atelectasis is noted. Bony thorax is unremarkable. IMPRESSION: Stable elevated left hemidiaphragm. Minimal bibasilar subsegmental atelectasis. Electronically Signed   By: Marijo Conception M.D.   On: 06/24/2022 08:09     Patient Profile     81 y.o. male with medical history significant of hypertension, hyperlipidemia, CVA, central retinal artery occlusion, prostate caner s/p radiation, progressive dementia and prior tobacco abuse who presented after being found to be hypoxic by facility staff. Normally he was not on oxygen. His ex wife who  comes to visit him regularly notes that his memory has been on the decline, but he is able to recognize her and their three children.  She is states he usually does not remember his age.   Assessment & Plan     Heart Failure with Reduced Ejection Fraction (biventricular with ischemic changes) Dementia Sepsis and PNA HTN and HLD  HFrEF: here with decompensated HF. EF 30-35%; newly reduced Frequent PVCs - remains VO; continue IV lasix BID - given his dementia, we will proceed with medical therapy but will defer ischemic evaluation unless having CP, see 06/21/22 notes for family discussion - increase Coreg to 12.'5mg'$  PO BID to 25 mg BID with frequent PVCs - continue jardiance 10 mg daily,  - will start entresto 24-26 mg and low dose spironolactone 12.5 mg daily [crt stable, K 3.5] - will stop hydralazine/imdur to allow BP room for GDMT above. Will reserve these additions if BP can tolerate - K goal of 4 Mg goal of 2  - myalgias resolved, I am concerned for underlying CAD and statin is reasonable  PNA -per primary team  For questions or updates, please contact [place] Please consult [email or web address] for contact info under        Signed, Janina Mayo, MD  06/25/2022, 11:04 AM

## 2022-06-25 NOTE — Progress Notes (Signed)
PROGRESS NOTE                                                                                                                                                                                                             Patient Demographics:    Tommy Bauer, is a 81 y.o. male, DOB - June 04, 1941, INO:[id number]  Outpatient Primary MD for the patient is Garwin Brothers, MD    LOS - 7  Admit date - 06/18/2022    Chief Complaint  Patient presents with   Shortness of Breath       Brief Narrative (HPI from H&P)     81 y.o. male with medical history significant of hypertension, hyperlipidemia, CVA, central retinal artery occlusion, prostate caner s/p radiation, progressive dementia and prior tobacco abuse who presented after being found to be hypoxic by facility staff. Normally he was not on oxygen. His ex wife who comes to visit him regularly notes that his memory has been on the decline, but he is able to recognize her and their three children.  She is states he usually does not remember his age and currently cannot tell me why he is here.  He sustained a wound on the right side of his head after being hospitalized for abscess and cellulitis of the scalp last year.  Ever since that time he has been at the [facility].   His work-up was consistent with pneumonia and CHF, he was seen by cardiology and admitted to the hospital.   Subjective:   Denies any complaints today, no significant events overnight as discussed with staff   Assessment  & Plan :    Acute hypoxic respiratory failure secondary to pneumonia and acute on chronic combined CHF.  - He had subjective fevers, leukocytosis, elevated procalcitonin along with CT evidence of possible right lower lobe pneumonia.  -  His work-up is also consistent with fluid overload and CHF decompensation. -Treated initially with IV antibiotics, improving, he is transitioned to oral regimen  currently . - seen by SLP.  Encouraged to sit up in chair in the daytime use I-S and flutter valve for pulmonary toiletry. -As well he remains with poor inspiratory effort, and left elevated hemidiaphragm which contributing to his hypoxia and oxygen requirement  Acute on chronic combined systolic and diastolic CHF.   -Last known EF 55%,  EF has now  dropped to 35% with wall motion abnormalities. -Neurology, continue with IV Lasix 40 mg IV twice daily, renal function improving, management per cardiology, Coreg was increased, started on Jardiance, started on Entresto as well, hydralazine and Imdur has been discontinued to allow more room in blood pressure for other meds.     Non-ACS pattern elevation of troponin.  Trend is flat likely due to demand mismatch caused by hypoxia from #1 above.  Continue aspirin, low-dose beta-blocker if tolerated by blood pressure and heart rate, continue statin for secondary prevention.  Gradually progressive dementia.  At risk for delirium.  Minimize narcotics and benzodiazepines.  PT, OT.  Post discharge go back to SNF.  Dyslipidemia.  On statin.  Hx of stroke.  Continue combination of aspirin and statin for secondary prevention.  History of prostate cancer.  Patient s/p radiation treatment about 11 years ago now thought to be in remission.  Outpatient follow-up by PCP.  AKI on CKD stage III A.  Baseline creatinine around 1.2.  Monitor.  Headache resolved after receiving Fioricet      Condition - Extremely Guarded  Family Communication  :  None present  None at bedside  Code Status :  Full  Consults  :  Cards  PUD Prophylaxis : PPI   Procedures  :     TTE -  1. Left ventricular ejection fraction, by estimation, is 30 to 35%. The left ventricle has moderately decreased function. The left ventricle demonstrates regional wall motion abnormalities (see scoring diagram/findings for description). Indeterminate diastolic filling due to E-A fusion. There  is severe hypokinesis of the left ventricular, entire inferoseptal wall and inferior wall.  2. Right ventricular systolic function is severely reduced. The right ventricular size is severely enlarged. Tricuspid regurgitation signal is inadequate for assessing PA pressure.  3. Left atrial size was mildly dilated.  4. Right atrial size was mildly dilated.  5. The mitral valve is normal in structure. Trivial mitral valve regurgitation.  6. The aortic valve is tricuspid. There is mild calcification of the aortic valve. Aortic valve regurgitation is not visualized. Aortic valve sclerosis/calcification is present, without any evidence of aortic stenosis.  7. The inferior vena cava is dilated in size with <50% respiratory variability, suggesting right atrial pressure of 15 mmHg.  CTA - 1. No pulmonary embolus. 2. Ill-defined opacity in the medial aspect of the right lower lobe may be atelectasis or pneumonia. 3. Moderate multi chamber cardiomegaly with coronary artery calcifications. 4. Dilated main pulmonary artery suggesting pulmonary arterial hypertension. 5. Mild multifocal mediastinal and hilar adenopathy and shotty lymph nodes, likely reactive. 6. Elevated left hemidiaphragm with adjacent compressive atelectasis in the lingula and left lower lobe. Aortic Atherosclerosis.      Disposition Plan  :    Status is: Inpatient  DVT Prophylaxis  :    Place TED hose Start: 06/21/22 0556 Place TED hose Start: 06/19/22 0832 enoxaparin (LOVENOX) injection 40 mg Start: 06/18/22 1800    Lab Results  Component Value Date   PLT 168 06/24/2022    Diet :  Diet Order             Diet Heart Room service appropriate? Yes; Fluid consistency: Thin; Fluid restriction: 1500 mL Fluid  Diet effective now                    Inpatient Medications  Scheduled Meds:  aspirin EC  81 mg Oral Daily   atorvastatin  40 mg Oral  QPM   carvedilol  25 mg Oral BID WC   empagliflozin  10 mg Oral Daily   enoxaparin  (LOVENOX) injection  40 mg Subcutaneous C16L   folic acid  1 mg Oral Daily   furosemide  40 mg Intravenous BID   latanoprost  1 drop Both Eyes QHS   pantoprazole  40 mg Oral Daily   potassium chloride  20 mEq Oral BID   sacubitril-valsartan  1 tablet Oral BID   simethicone  120 mg Oral BID   sodium chloride flush  3 mL Intravenous Q12H   spironolactone  12.5 mg Oral Daily   thiamine  100 mg Oral Daily   Continuous Infusions:   PRN Meds:.acetaminophen **OR** acetaminophen, albuterol, butalbital-acetaminophen-caffeine  Time Spent in minutes  30   Phillips Climes M.D on 06/25/2022 at 1:35 PM  To page go to [email or web address]   [facility] -  Office  [phone]  See all Orders from today for further details    Objective:   Vitals:   06/25/22 0815 06/25/22 0900 06/25/22 1000 06/25/22 1100  BP:    (!) 139/92  Pulse: 66 74 69 70  Resp: 19 (!) 24 (!) 24 (!) 23  Temp:    (!) 97.5 F (36.4 C)  TempSrc:    Oral  SpO2: 100% 100% 100% 100%  Weight:      Height:        Wt Readings from Last 3 Encounters:  06/24/22 96.8 kg  09/20/21 99.8 kg  02/12/21 102.1 kg     Intake/Output Summary (Last 24 hours) at 06/25/2022 1335 Last data filed at 06/25/2022 1105 Gross per 24 hour  Intake 960 ml  Output 1875 ml  Net -915 ml     Physical Exam  Awake Alert, deconditioned Symmetrical Chest wall movement, diminished air entry at the bases RRR,No Gallops,Rubs or new Murmurs, No Parasternal Heave +ve B.Sounds, Abd Soft, No tenderness, No rebound - guarding or rigidity. No Cyanosis, Clubbing or edema, No new Rash or bruise        Data Review:    CBC Recent Labs  Lab 06/20/22 0253 06/21/22 0303 06/22/22 0425 06/23/22 0344 06/24/22 0444  WBC 10.1 7.0 4.8 5.4 6.0  HGB 15.3 14.9 14.5 13.7 14.4  HCT 51.1 49.1 47.4 45.7 47.3  PLT 190 175 164 178 168  MCV 91.3 91.4 91.2 90.1 91.5  MCH 27.3 27.7 27.9 27.0 27.9  MCHC 29.9* 30.3 30.6 30.0 30.4  RDW 17.6* 16.9* 16.2*  16.1* 16.1*  LYMPHSABS 0.8 0.9 0.7 0.9 1.1  MONOABS 1.0 1.0 0.7 0.9 0.8  EOSABS 0.2 0.3 0.2 0.3 0.3  BASOSABS 0.0 0.0 0.0 0.0 0.0    Electrolytes Recent Labs  Lab 06/18/22 1826 06/18/22 2008 06/18/22 2011 06/18/22 2225 06/19/22 0925 06/20/22 0253 06/21/22 0303 06/22/22 0425 06/23/22 0344 06/24/22 0444 06/25/22 0559  NA  --   --   --   --    < > 142 142 138 138 143 140  K  --   --   --   --    < > 4.2 4.4 3.8 3.9 4.6 3.5  CL  --   --   --   --    < > 99 95* 97* 97* 97* 94*  CO2  --   --   --   --    < > 33* 38* 33* 35* 39* 37*  GLUCOSE  --   --   --   --    < >  91 89 95 106* 92 91  BUN  --   --   --   --    < > '17 19 15 18 19 20  '$ CREATININE  --   --   --   --    < > 1.10 1.53* 1.16 1.28* 1.51* 1.27*  CALCIUM  --   --   --   --    < > 9.1 9.2 8.9 8.7* 9.0 9.0  AST  --   --   --   --   --  '25 19 16 '$ 14* 18  --   ALT  --   --   --   --   --  '22 19 16 14 14  '$ --   ALKPHOS  --   --   --   --   --  51 47 45 44 47  --   BILITOT  --   --   --   --   --  1.3* 1.3* 1.6* 1.1 0.8  --   ALBUMIN  --   --   --   --   --  3.6 3.3* 3.1* 3.0* 3.2*  --   MG  --    < >  --   --   --  2.1 2.1 1.8 2.2 2.2  --   PROCALCITON 6.86  --   --   --   --   --   --   --   --   --   --   LATICACIDVEN 4.7*  --  4.3*  --   --   --   --   --   --   --   --   BNP  --   --   --  1,915.9*  --  554.9* 406.8* 431.3* 602.8*  --   --    < > = values in this interval not displayed.    ------------------------------------------------------------------------------------------------------------------ No results for input(s): "CHOL", "HDL", "LDLCALC", "TRIG", "CHOLHDL", "LDLDIRECT" in the last 72 hours.   No results found for: "HGBA1C"  No results for input(s): "TSH", "T4TOTAL", "T3FREE", "THYROIDAB" in the last 72 hours.  Invalid input(s): "FREET3" ------------------------------------------------------------------------------------------------------------------ ID Labs Recent Labs  Lab 06/18/22 1826  06/18/22 2011 06/19/22 3893 06/20/22 0253 06/21/22 0303 06/22/22 0425 06/23/22 0344 06/24/22 0444 06/25/22 0559  WBC  --   --   --  10.1 7.0 4.8 5.4 6.0  --   PLT  --   --   --  190 175 164 178 168  --   PROCALCITON 6.86  --   --   --   --   --   --   --   --   LATICACIDVEN 4.7* 4.3*  --   --   --   --   --   --   --   CREATININE  --   --    < > 1.10 1.53* 1.16 1.28* 1.51* 1.27*   < > = values in this interval not displayed.   Cardiac Enzymes No results for input(s): "CKMB", "TROPONINI", "MYOGLOBIN" in the last 168 hours.  Invalid input(s): "CK"  Radiology Reports DG Chest Port 1 View  Result Date: 06/24/2022 CLINICAL DATA:  Hypoxia. EXAM: PORTABLE CHEST 1 VIEW COMPARISON:  June 21, 2022. FINDINGS: Stable cardiomegaly. Stable elevated left hemidiaphragm is noted. Minimal bibasilar subsegmental atelectasis is noted. Bony thorax is unremarkable. IMPRESSION: Stable elevated left hemidiaphragm. Minimal bibasilar subsegmental atelectasis. Electronically Signed   By: Sabino Dick  Jr M.D.   On: 06/24/2022 08:09

## 2022-06-26 DIAGNOSIS — I5020 Unspecified systolic (congestive) heart failure: Secondary | ICD-10-CM | POA: Diagnosis not present

## 2022-06-26 DIAGNOSIS — J9601 Acute respiratory failure with hypoxia: Secondary | ICD-10-CM | POA: Diagnosis not present

## 2022-06-26 DIAGNOSIS — I5023 Acute on chronic systolic (congestive) heart failure: Secondary | ICD-10-CM | POA: Diagnosis not present

## 2022-06-26 DIAGNOSIS — J9602 Acute respiratory failure with hypercapnia: Secondary | ICD-10-CM | POA: Diagnosis not present

## 2022-06-26 LAB — BASIC METABOLIC PANEL
Anion gap: 13 (ref 5–15)
BUN: 23 mg/dL (ref 8–23)
CO2: 34 mmol/L — ABNORMAL HIGH (ref 22–32)
Calcium: 9.1 mg/dL (ref 8.9–10.3)
Chloride: 94 mmol/L — ABNORMAL LOW (ref 98–111)
Creatinine, Ser: 1.29 mg/dL — ABNORMAL HIGH (ref 0.61–1.24)
GFR, Estimated: 56 mL/min — ABNORMAL LOW (ref >60)
Glucose, Bld: 93 mg/dL (ref 70–99)
Potassium: 4.1 mmol/L (ref 3.5–5.1)
Sodium: 141 mmol/L (ref 135–145)

## 2022-06-26 LAB — CBC
HCT: 49.6 % (ref 39.0–52.0)
Hemoglobin: 15.4 g/dL (ref 13.0–17.0)
MCH: 27.5 pg (ref 26.0–34.0)
MCHC: 31 g/dL (ref 30.0–36.0)
MCV: 88.6 fL (ref 80.0–100.0)
Platelets: 176 10*3/uL (ref 150–400)
RBC: 5.6 MIL/uL (ref 4.22–5.81)
RDW: 15.8 % — ABNORMAL HIGH (ref 11.5–15.5)
WBC: 5.8 10*3/uL (ref 4.0–10.5)
nRBC: 0 % (ref 0.0–0.2)

## 2022-06-26 MED ORDER — CARVEDILOL 25 MG PO TABS
25.0000 mg | ORAL_TABLET | Freq: Two times a day (BID) | ORAL | Status: DC
  Administered 2022-06-26 – 2022-06-27 (×2): 25 mg via ORAL
  Filled 2022-06-26 (×2): qty 1

## 2022-06-26 NOTE — Progress Notes (Addendum)
Progress Note  Patient Name: Tommy Bauer Date of Encounter: 06/26/2022  [facility] Cardiologist: New to [facility]  Subjective   No distress. Having abdominal pain. Wants to take a BM   Blood pressures in a good range Crt stable 1.27->1.29. K 4.1 220->218->213 Net - 410 L; total net negative 12L  Inpatient Medications    Scheduled Meds:  aspirin EC  81 mg Oral Daily   atorvastatin  40 mg Oral QPM   carvedilol  25 mg Oral BID WC   empagliflozin  10 mg Oral Daily   enoxaparin (LOVENOX) injection  40 mg Subcutaneous C12X   folic acid  1 mg Oral Daily   furosemide  40 mg Intravenous BID   latanoprost  1 drop Both Eyes QHS   pantoprazole  40 mg Oral Daily   potassium chloride  20 mEq Oral BID   sacubitril-valsartan  1 tablet Oral BID   simethicone  120 mg Oral BID   sodium chloride flush  3 mL Intravenous Q12H   spironolactone  12.5 mg Oral Daily   thiamine  100 mg Oral Daily   Continuous Infusions:   PRN Meds: acetaminophen **OR** acetaminophen, albuterol, butalbital-acetaminophen-caffeine   Vital Signs    Vitals:   06/25/22 1943 06/25/22 2247 06/25/22 2339 06/26/22 0335  BP: 108/70  90/69 119/70  Pulse: 66  70 61  Resp: '19  19 19  '$ Temp: 98 F (36.7 C)  97.9 F (36.6 C) 98.1 F (36.7 C)  TempSrc: Oral  Oral Oral  SpO2: 100% (!) 76% 95% 92%  Weight:      Height:        Intake/Output Summary (Last 24 hours) at 06/26/2022 0714 Last data filed at 06/25/2022 2055 Gross per 24 hour  Intake 840 ml  Output 1250 ml  Net -410 ml      06/24/2022    5:00 AM 06/22/2022    5:00 AM 06/18/2022    7:50 PM  Last 3 Weights  Weight (lbs) 213 lb 6.5 oz 218 lb 7.6 oz 228 lb 9.9 oz  Weight (kg) 96.8 kg 99.1 kg 103.7 kg      Telemetry    CHB, PVCs,  Personally Reviewed  Physical Exam   GEN: No acute distress.   NECK: no significant JVD Cardiac: RRR, no murmurs, rubs, or gallops.  Respiratory: nl wob, decreased BS in the bases GI: Soft, nontender,  non-distended  MS: non pitting edema, skin is warm Neuro:  Oriented to person and place Psych: Normal affect   Labs    High Sensitivity Troponin:   Recent Labs  Lab 06/18/22 1121 06/18/22 1302  TROPONINIHS 133* 134*     Chemistry Recent Labs  Lab 06/22/22 0425 06/23/22 0344 06/24/22 0444 06/25/22 0559 06/26/22 0300  NA 138 138 143 140 141  K 3.8 3.9 4.6 3.5 4.1  CL 97* 97* 97* 94* 94*  CO2 33* 35* 39* 37* 34*  GLUCOSE 95 106* 92 91 93  BUN '15 18 19 20 23  '$ CREATININE 1.16 1.28* 1.51* 1.27* 1.29*  CALCIUM 8.9 8.7* 9.0 9.0 9.1  MG 1.8 2.2 2.2  --   --   PROT 6.1* 6.3* 6.6  --   --   ALBUMIN 3.1* 3.0* 3.2*  --   --   AST 16 14* 18  --   --   ALT '16 14 14  '$ --   --   ALKPHOS 45 44 47  --   --   BILITOT 1.6*  1.1 0.8  --   --   GFRNONAA >60 56* 46* 57* 56*  ANIONGAP '8 6 7 9 13    '$ Lipids  Recent Labs  Lab 06/19/22 0925  CHOL 102  TRIG 22  HDL 36*  LDLCALC 62  CHOLHDL 2.8    Hematology Recent Labs  Lab 06/23/22 0344 06/24/22 0444 06/26/22 0300  WBC 5.4 6.0 5.8  RBC 5.07 5.17 5.60  HGB 13.7 14.4 15.4  HCT 45.7 47.3 49.6  MCV 90.1 91.5 88.6  MCH 27.0 27.9 27.5  MCHC 30.0 30.4 31.0  RDW 16.1* 16.1* 15.8*  PLT 178 168 176   Thyroid No results for input(s): "TSH", "FREET4" in the last 168 hours.  BNP Recent Labs  Lab 06/21/22 0303 06/22/22 0425 06/23/22 0344  BNP 406.8* 431.3* 602.8*    DDimer No results for input(s): "DDIMER" in the last 168 hours.   Radiology    DG Chest Port 1 View  Result Date: 06/24/2022 CLINICAL DATA:  Hypoxia. EXAM: PORTABLE CHEST 1 VIEW COMPARISON:  June 21, 2022. FINDINGS: Stable cardiomegaly. Stable elevated left hemidiaphragm is noted. Minimal bibasilar subsegmental atelectasis is noted. Bony thorax is unremarkable. IMPRESSION: Stable elevated left hemidiaphragm. Minimal bibasilar subsegmental atelectasis. Electronically Signed   By: Marijo Conception M.D.   On: 06/24/2022 08:09     Patient Profile     81 y.o. male  with medical history significant of hypertension, hyperlipidemia, CVA, central retinal artery occlusion, prostate caner s/p radiation, progressive dementia and prior tobacco abuse who presented after being found to be hypoxic by facility staff. Normally he was not on oxygen. His ex wife who comes to visit him regularly notes that his memory has been on the decline, but he is able to recognize her and their three children.  She is states he usually does not remember his age.   Assessment & Plan     Heart Failure with Reduced Ejection Fraction (biventricular with ischemic changes) Dementia Sepsis and PNA HTN and HLD  Prolonged AV block: Discussed with EP, has long 1st degree, he is conducting. P's not marching post PVC.   HFrEF: here with decompensated HF. EF 30-35%; newly reduced Frequent PVCs - VO improving; continue IV lasix BID, can transition to oral diuretics soon - given his dementia, we will proceed with medical therapy but will defer ischemic evaluation unless having CP, see 06/21/22 notes for family discussion - continue BB - continue jardiance 10 mg daily,  - started entresto 24-26 mg and low dose spironolactone 12.5 mg daily  - will stop hydralazine/imdur to allow BP room for GDMT above. Will reserve these additions if BP can tolerate - K goal of 4 Mg goal of 2  - myalgias resolved, I am concerned for underlying CAD and statin is reasonable  PNA -per primary team  With multiple co-morbidities, recommend discussion of code status change  For questions or updates, please contact [facility] Please consult [email or web address] for contact info under        Signed, Janina Mayo, MD  06/26/2022, 7:14 AM

## 2022-06-26 NOTE — Progress Notes (Signed)
PROGRESS NOTE                                                                                                                                                                                                             Patient Demographics:    Tommy Bauer, is a 81 y.o. male, DOB - 11-07-1940, VVO:[id number]  Outpatient Primary MD for the patient is Garwin Brothers, MD    LOS - 8  Admit date - 06/18/2022    Chief Complaint  Patient presents with   Shortness of Breath       Brief Narrative (HPI from H&P)     81 y.o. male with medical history significant of hypertension, hyperlipidemia, CVA, central retinal artery occlusion, prostate caner s/p radiation, progressive dementia and prior tobacco abuse who presented after being found to be hypoxic by facility staff. Normally he was not on oxygen. His ex wife who comes to visit him regularly notes that his memory has been on the decline, but he is able to recognize her and their three children.  She is states he usually does not remember his age and currently cannot tell me why he is here.  He sustained a wound on the right side of his head after being hospitalized for abscess and cellulitis of the scalp last year.  Ever since that time he has been at the [facility].   His work-up was consistent with pneumonia and CHF, he was seen by cardiology and admitted to the hospital.   Subjective:   Denies any complaints today, no significant events overnight as discussed with staff   Assessment  & Plan :    Acute hypoxic respiratory failure secondary to pneumonia and acute on chronic combined CHF.  - He had subjective fevers, leukocytosis, elevated procalcitonin along with CT evidence of possible right lower lobe pneumonia.  -  His work-up is also consistent with fluid overload and CHF decompensation. -Treated initially with IV antibiotics, improving, he is transitioned to oral regimen  currently . - seen by SLP.  Encouraged to sit up in chair in the daytime use I-S and flutter valve for pulmonary toiletry. -As well he remains with poor inspiratory effort, and left elevated hemidiaphragm which contributing to his hypoxia and oxygen requirement  Acute on chronic combined systolic and diastolic CHF.   -Last known EF 55%,  EF has now  dropped to 35% with wall motion abnormalities. -Cardiology input greatly appreciated, volume status improving, but still some evidence of volume overload, so we will continue with current dose of Lasix 40 mg IV twice daily specially his renal function is stable, so far he is -13 L this admission.. -management per cardiology, Coreg was increased, started on Jardiance, started on Entresto as well, hydralazine and Imdur has been discontinued to allow more room in blood pressure for other meds.     Non-ACS pattern elevation of troponin.  Trend is flat likely due to demand mismatch caused by hypoxia from #1 above.  Continue aspirin, low-dose beta-blocker if tolerated by blood pressure and heart rate, continue statin for secondary prevention.  Gradually progressive dementia.  At risk for delirium.  Minimize narcotics and benzodiazepines.  PT, OT.  Post discharge go back to SNF.  Dyslipidemia.  On statin.  Hx of stroke.  Continue combination of aspirin and statin for secondary prevention.  History of prostate cancer.  Patient s/p radiation treatment about 11 years ago now thought to be in remission.  Outpatient follow-up by PCP.  AKI on CKD stage III A.  Baseline creatinine around 1.2.  Monitor.  Headache resolved after receiving Fioricet      Condition - Extremely Guarded  Family Communication  :  None present  None at bedside  Code Status :  Full  Consults  :  Cards  PUD Prophylaxis : PPI   Procedures  :     TTE -  1. Left ventricular ejection fraction, by estimation, is 30 to 35%. The left ventricle has moderately decreased function.  The left ventricle demonstrates regional wall motion abnormalities (see scoring diagram/findings for description). Indeterminate diastolic filling due to E-A fusion. There is severe hypokinesis of the left ventricular, entire inferoseptal wall and inferior wall.  2. Right ventricular systolic function is severely reduced. The right ventricular size is severely enlarged. Tricuspid regurgitation signal is inadequate for assessing PA pressure.  3. Left atrial size was mildly dilated.  4. Right atrial size was mildly dilated.  5. The mitral valve is normal in structure. Trivial mitral valve regurgitation.  6. The aortic valve is tricuspid. There is mild calcification of the aortic valve. Aortic valve regurgitation is not visualized. Aortic valve sclerosis/calcification is present, without any evidence of aortic stenosis.  7. The inferior vena cava is dilated in size with <50% respiratory variability, suggesting right atrial pressure of 15 mmHg.  CTA - 1. No pulmonary embolus. 2. Ill-defined opacity in the medial aspect of the right lower lobe may be atelectasis or pneumonia. 3. Moderate multi chamber cardiomegaly with coronary artery calcifications. 4. Dilated main pulmonary artery suggesting pulmonary arterial hypertension. 5. Mild multifocal mediastinal and hilar adenopathy and shotty lymph nodes, likely reactive. 6. Elevated left hemidiaphragm with adjacent compressive atelectasis in the lingula and left lower lobe. Aortic Atherosclerosis.      Disposition Plan  :    Status is: Inpatient  DVT Prophylaxis  :    Place TED hose Start: 06/21/22 0556 Place TED hose Start: 06/19/22 0832 enoxaparin (LOVENOX) injection 40 mg Start: 06/18/22 1800    Lab Results  Component Value Date   PLT 176 06/26/2022    Diet :  Diet Order             Diet Heart Room service appropriate? Yes; Fluid consistency: Thin; Fluid restriction: 1500 mL Fluid  Diet effective now  Inpatient  Medications  Scheduled Meds:  aspirin EC  81 mg Oral Daily   atorvastatin  40 mg Oral QPM   carvedilol  25 mg Oral BID WC   empagliflozin  10 mg Oral Daily   enoxaparin (LOVENOX) injection  40 mg Subcutaneous F29W   folic acid  1 mg Oral Daily   furosemide  40 mg Intravenous BID   latanoprost  1 drop Both Eyes QHS   pantoprazole  40 mg Oral Daily   potassium chloride  20 mEq Oral BID   sacubitril-valsartan  1 tablet Oral BID   simethicone  120 mg Oral BID   sodium chloride flush  3 mL Intravenous Q12H   spironolactone  12.5 mg Oral Daily   thiamine  100 mg Oral Daily   Continuous Infusions:   PRN Meds:.acetaminophen **OR** acetaminophen, albuterol, butalbital-acetaminophen-caffeine  Time Spent in minutes  30   Phillips Climes M.D on 06/26/2022 at 2:55 PM  To page go to [email or web address]   [facility] -  Office  [phone]  See all Orders from today for further details    Objective:   Vitals:   06/26/22 0335 06/26/22 0757 06/26/22 0800 06/26/22 1200  BP: 119/70 (!) 140/79 135/74 119/75  Pulse: 61 62 65 67  Resp: '19 16 19 '$ (!) 22  Temp: 98.1 F (36.7 C) 98.4 F (36.9 C) 98 F (36.7 C) 98.2 F (36.8 C)  TempSrc: Oral Oral Oral Oral  SpO2: 92% 91% 90% 95%  Weight:      Height:        Wt Readings from Last 3 Encounters:  06/24/22 96.8 kg  09/20/21 99.8 kg  02/12/21 102.1 kg     Intake/Output Summary (Last 24 hours) at 06/26/2022 1455 Last data filed at 06/26/2022 1215 Gross per 24 hour  Intake 360 ml  Output 2350 ml  Net -1990 ml     Physical Exam  Awake Alert, Oriented X 3, No new F.N deficits, Normal affect Symmetrical Chest wall movement, diminished air entry at the lung bases RRR,No Gallops,Rubs or new Murmurs, No Parasternal Heave +ve B.Sounds, Abd Soft, No tenderness, No rebound - guarding or rigidity. No Cyanosis, Clubbing or edema, No new Rash or bruise        Data Review:    CBC Recent Labs  Lab 06/20/22 0253 06/21/22 0303  06/22/22 0425 06/23/22 0344 06/24/22 0444 06/26/22 0300  WBC 10.1 7.0 4.8 5.4 6.0 5.8  HGB 15.3 14.9 14.5 13.7 14.4 15.4  HCT 51.1 49.1 47.4 45.7 47.3 49.6  PLT 190 175 164 178 168 176  MCV 91.3 91.4 91.2 90.1 91.5 88.6  MCH 27.3 27.7 27.9 27.0 27.9 27.5  MCHC 29.9* 30.3 30.6 30.0 30.4 31.0  RDW 17.6* 16.9* 16.2* 16.1* 16.1* 15.8*  LYMPHSABS 0.8 0.9 0.7 0.9 1.1  --   MONOABS 1.0 1.0 0.7 0.9 0.8  --   EOSABS 0.2 0.3 0.2 0.3 0.3  --   BASOSABS 0.0 0.0 0.0 0.0 0.0  --     Electrolytes Recent Labs  Lab 06/20/22 0253 06/21/22 0303 06/22/22 0425 06/23/22 0344 06/24/22 0444 06/25/22 0559 06/26/22 0300  NA 142 142 138 138 143 140 141  K 4.2 4.4 3.8 3.9 4.6 3.5 4.1  CL 99 95* 97* 97* 97* 94* 94*  CO2 33* 38* 33* 35* 39* 37* 34*  GLUCOSE 91 89 95 106* 92 91 93  BUN '17 19 15 18 19 20 23  '$ CREATININE 1.10 1.53* 1.16 1.28* 1.51* 1.27* 1.29*  CALCIUM 9.1 9.2 8.9 8.7* 9.0 9.0 9.1  AST '25 19 16 '$ 14* 18  --   --   ALT '22 19 16 14 14  '$ --   --   ALKPHOS 51 47 45 44 47  --   --   BILITOT 1.3* 1.3* 1.6* 1.1 0.8  --   --   ALBUMIN 3.6 3.3* 3.1* 3.0* 3.2*  --   --   MG 2.1 2.1 1.8 2.2 2.2  --   --   BNP 554.9* 406.8* 431.3* 602.8*  --   --   --     ------------------------------------------------------------------------------------------------------------------ No results for input(s): "CHOL", "HDL", "LDLCALC", "TRIG", "CHOLHDL", "LDLDIRECT" in the last 72 hours.   No results found for: "HGBA1C"  No results for input(s): "TSH", "T4TOTAL", "T3FREE", "THYROIDAB" in the last 72 hours.  Invalid input(s): "FREET3" ------------------------------------------------------------------------------------------------------------------ ID Labs Recent Labs  Lab 06/21/22 0303 06/22/22 0425 06/23/22 0344 06/24/22 0444 06/25/22 0559 06/26/22 0300  WBC 7.0 4.8 5.4 6.0  --  5.8  PLT 175 164 178 168  --  176  CREATININE 1.53* 1.16 1.28* 1.51* 1.27* 1.29*   Cardiac Enzymes No results for  input(s): "CKMB", "TROPONINI", "MYOGLOBIN" in the last 168 hours.  Invalid input(s): "CK"  Radiology Reports DG Chest Port 1 View  Result Date: 06/24/2022 CLINICAL DATA:  Hypoxia. EXAM: PORTABLE CHEST 1 VIEW COMPARISON:  June 21, 2022. FINDINGS: Stable cardiomegaly. Stable elevated left hemidiaphragm is noted. Minimal bibasilar subsegmental atelectasis is noted. Bony thorax is unremarkable. IMPRESSION: Stable elevated left hemidiaphragm. Minimal bibasilar subsegmental atelectasis. Electronically Signed   By: Marijo Conception M.D.   On: 06/24/2022 08:09

## 2022-06-27 DIAGNOSIS — J9602 Acute respiratory failure with hypercapnia: Secondary | ICD-10-CM | POA: Diagnosis not present

## 2022-06-27 DIAGNOSIS — J9601 Acute respiratory failure with hypoxia: Secondary | ICD-10-CM | POA: Diagnosis not present

## 2022-06-27 DIAGNOSIS — I441 Atrioventricular block, second degree: Secondary | ICD-10-CM | POA: Diagnosis not present

## 2022-06-27 DIAGNOSIS — I5043 Acute on chronic combined systolic (congestive) and diastolic (congestive) heart failure: Secondary | ICD-10-CM

## 2022-06-27 LAB — BASIC METABOLIC PANEL
Anion gap: 9 (ref 5–15)
BUN: 17 mg/dL (ref 8–23)
CO2: 38 mmol/L — ABNORMAL HIGH (ref 22–32)
Calcium: 8.7 mg/dL — ABNORMAL LOW (ref 8.9–10.3)
Chloride: 93 mmol/L — ABNORMAL LOW (ref 98–111)
Creatinine, Ser: 1.19 mg/dL (ref 0.61–1.24)
GFR, Estimated: >60 mL/min (ref >60)
Glucose, Bld: 93 mg/dL (ref 70–99)
Potassium: 3.6 mmol/L (ref 3.5–5.1)
Sodium: 140 mmol/L (ref 135–145)

## 2022-06-27 MED ORDER — POTASSIUM CHLORIDE CRYS ER 20 MEQ PO TBCR: 20.0000 meq | EXTENDED_RELEASE_TABLET | Freq: Two times a day (BID) | ORAL | Status: AC

## 2022-06-27 MED ORDER — CARVEDILOL 25 MG PO TABS: 25.0000 mg | ORAL_TABLET | Freq: Two times a day (BID) | ORAL | Status: DC

## 2022-06-27 MED ORDER — PANTOPRAZOLE SODIUM 40 MG PO TBEC
40.0000 mg | DELAYED_RELEASE_TABLET | Freq: Every day | ORAL | Status: AC
Start: 2022-06-28 — End: ?

## 2022-06-27 MED ORDER — SPIRONOLACTONE 25 MG PO TABS
12.5000 mg | ORAL_TABLET | Freq: Every day | ORAL | Status: AC
Start: 2022-06-28 — End: ?

## 2022-06-27 MED ORDER — SACUBITRIL-VALSARTAN 24-26 MG PO TABS: 1.0000 | ORAL_TABLET | Freq: Two times a day (BID) | ORAL | Status: AC

## 2022-06-27 MED ORDER — ALBUTEROL SULFATE (2.5 MG/3ML) 0.083% IN NEBU
2.5000 mg | INHALATION_SOLUTION | RESPIRATORY_TRACT | 12 refills | Status: AC | PRN
Start: 2022-06-27 — End: ?

## 2022-06-27 MED ORDER — EMPAGLIFLOZIN 10 MG PO TABS
10.0000 mg | ORAL_TABLET | Freq: Every day | ORAL | Status: AC
Start: 2022-06-28 — End: ?

## 2022-06-27 MED ORDER — FUROSEMIDE 40 MG PO TABS
40.0000 mg | ORAL_TABLET | Freq: Every day | ORAL | Status: DC
  Administered 2022-06-27 – 2022-06-28 (×2): 40 mg via ORAL
  Filled 2022-06-27 (×2): qty 1

## 2022-06-27 NOTE — Progress Notes (Signed)
PROGRESS NOTE                                                                                                                                                                                                             Patient Demographics:    Tommy Bauer, is a 81 y.o. male, DOB - 21-Dec-1940, YNW:[id number]  Outpatient Primary MD for the patient is Garwin Brothers, MD    LOS - 9  Admit date - 06/18/2022    Chief Complaint  Patient presents with   Shortness of Breath       Brief Narrative (HPI from H&P)     81 y.o. male with medical history significant of hypertension, hyperlipidemia, CVA, central retinal artery occlusion, prostate caner s/p radiation, progressive dementia and prior tobacco abuse who presented after being found to be hypoxic by facility staff. Normally he was not on oxygen. His ex wife who comes to visit him regularly notes that his memory has been on the decline, but he is able to recognize her and their three children.  She is states he usually does not remember his age and currently cannot tell me why he is here.  He sustained a wound on the right side of his head after being hospitalized for abscess and cellulitis of the scalp last year.  Ever since that time he has been at the [facility].   His work-up was consistent with pneumonia and CHF, he was seen by cardiology and admitted to the hospital.   Subjective:   Denies any complaints today, no significant events overnight as discussed with staff   Assessment  & Plan :    Acute hypoxic respiratory failure secondary to pneumonia and acute on chronic combined CHF.  - He had subjective fevers, leukocytosis, elevated procalcitonin along with CT evidence of possible right lower lobe pneumonia.  -  His work-up is also consistent with fluid overload and CHF decompensation. -Treated initially with IV antibiotics, improving, he is transitioned to oral regimen  currently .  Further need of antibiotics on discharge. - seen by SLP.  Encouraged to sit up in chair in the daytime use I-S and flutter valve for pulmonary toiletry. -As well he remains with poor inspiratory effort, and left elevated hemidiaphragm which contributing to his hypoxia and oxygen requirement, he is currently on 2 L nasal cannula  as needed, please keep encouraging to use incentive spirometer and flutter valve at facility.   Acute on chronic combined systolic and diastolic CHF.   -Last known EF 55%, EF has now  dropped to 35% with wall motion abnormalities. -Cardiology input greatly appreciated, difficult volume overload where he received significant IV diuresis, overall his -15 L during hospital stay, medication has been adjusted by cardiology, he will be discharged on Jardiance, Coreg, Entresto and Aldactone , no he is appropriately diuresed to continue home dose Lasix 40 mg oral daily, I have lowered his potassium supplements as an outpatient given he is started on Entresto and Aldactone.   -Coreg  has been discontinued, please see discussion below  Bradycardia/second-degree heart block -This afternoon prior to discharge patient was noted to have 1 episode of bradycardia heart rate of 32, where he was noted to have second-degree AV block or every other beat is not conducted, discussed with cards, will hold Coreg for today, and monitor, and likely will resume at a lower dose tomorrow.     Non-ACS pattern elevation of troponin.  Trend is flat likely due to demand mismatch caused by hypoxia from #1 above.  Continue aspirin, low-dose beta-blocker if tolerated by blood pressure and heart rate, continue statin for secondary prevention.   Gradually progressive dementia.  At risk for delirium.  Minimize narcotics and benzodiazepines.  PT, OT.  Post discharge go back to SNF.   Dyslipidemia.  On statin.   Hx of stroke.  Continue combination of aspirin and statin for secondary prevention.    History of prostate cancer.  Patient s/p radiation treatment about 11 years ago now thought to be in remission.  Outpatient follow-up by PCP.   AKI on CKD stage III A.  Baseline creatinine around 1.2.  Monitor.  Renal function back to baseline.        Condition - Extremely Guarded  Family Communication  :  None present  None at bedside  Code Status :  Full  Consults  :  Cards  PUD Prophylaxis : PPI   Procedures  :     TTE -  1. Left ventricular ejection fraction, by estimation, is 30 to 35%. The left ventricle has moderately decreased function. The left ventricle demonstrates regional wall motion abnormalities (see scoring diagram/findings for description). Indeterminate diastolic filling due to E-A fusion. There is severe hypokinesis of the left ventricular, entire inferoseptal wall and inferior wall.  2. Right ventricular systolic function is severely reduced. The right ventricular size is severely enlarged. Tricuspid regurgitation signal is inadequate for assessing PA pressure.  3. Left atrial size was mildly dilated.  4. Right atrial size was mildly dilated.  5. The mitral valve is normal in structure. Trivial mitral valve regurgitation.  6. The aortic valve is tricuspid. There is mild calcification of the aortic valve. Aortic valve regurgitation is not visualized. Aortic valve sclerosis/calcification is present, without any evidence of aortic stenosis.  7. The inferior vena cava is dilated in size with <50% respiratory variability, suggesting right atrial pressure of 15 mmHg.  CTA - 1. No pulmonary embolus. 2. Ill-defined opacity in the medial aspect of the right lower lobe may be atelectasis or pneumonia. 3. Moderate multi chamber cardiomegaly with coronary artery calcifications. 4. Dilated main pulmonary artery suggesting pulmonary arterial hypertension. 5. Mild multifocal mediastinal and hilar adenopathy and shotty lymph nodes, likely reactive. 6. Elevated left hemidiaphragm with  adjacent compressive atelectasis in the lingula and left lower lobe. Aortic Atherosclerosis.  Disposition Plan  :    Status is: Inpatient  DVT Prophylaxis  :    Place TED hose Start: 06/21/22 0556 Place TED hose Start: 06/19/22 0832 enoxaparin (LOVENOX) injection 40 mg Start: 06/18/22 1800    Lab Results  Component Value Date   PLT 176 06/26/2022    Diet :  Diet Order             Diet - low sodium heart healthy           Diet Heart Room service appropriate? Yes; Fluid consistency: Thin; Fluid restriction: 1500 mL Fluid  Diet effective now                    Inpatient Medications  Scheduled Meds:  aspirin EC  81 mg Oral Daily   atorvastatin  40 mg Oral QPM   empagliflozin  10 mg Oral Daily   enoxaparin (LOVENOX) injection  40 mg Subcutaneous E56D   folic acid  1 mg Oral Daily   furosemide  40 mg Oral Daily   latanoprost  1 drop Both Eyes QHS   pantoprazole  40 mg Oral Daily   potassium chloride  20 mEq Oral BID   sacubitril-valsartan  1 tablet Oral BID   simethicone  120 mg Oral BID   sodium chloride flush  3 mL Intravenous Q12H   spironolactone  12.5 mg Oral Daily   thiamine  100 mg Oral Daily   Continuous Infusions:   PRN Meds:.acetaminophen **OR** acetaminophen, albuterol, butalbital-acetaminophen-caffeine  Time Spent in minutes  30   Phillips Climes M.D on 06/27/2022 at 5:09 PM  To page go to [email or web address]   [facility] -  Office  [phone]  See all Orders from today for further details    Objective:   Vitals:   06/27/22 0800 06/27/22 1200 06/27/22 1337 06/27/22 1600  BP: 122/72 109/69 126/86 117/72  Pulse: 63 (!) 59 60 69  Resp: 18 (!) '21 20 17  '$ Temp: 98 F (36.7 C) 98.3 F (36.8 C)  97.6 F (36.4 C)  TempSrc: Axillary Oral  Oral  SpO2: 92% 94% 94% 94%  Weight:      Height:        Wt Readings from Last 3 Encounters:  06/27/22 91.2 kg  09/20/21 99.8 kg  02/12/21 102.1 kg     Intake/Output Summary (Last 24  hours) at 06/27/2022 1709 Last data filed at 06/27/2022 1702 Gross per 24 hour  Intake --  Output 2800 ml  Net -2800 ml     Physical Exam  General: Pt is alert, awake, pleasant, communicative, Cardiovascular: RRR, S1/S2 +, no rubs, no gallops Respiratory: CTA bilaterally, no wheezing, no rhonchi Abdominal: Soft, NT, ND, bowel sounds + Extremities: no edema, no cyanosis       Data Review:    CBC Recent Labs  Lab 06/21/22 0303 06/22/22 0425 06/23/22 0344 06/24/22 0444 06/26/22 0300  WBC 7.0 4.8 5.4 6.0 5.8  HGB 14.9 14.5 13.7 14.4 15.4  HCT 49.1 47.4 45.7 47.3 49.6  PLT 175 164 178 168 176  MCV 91.4 91.2 90.1 91.5 88.6  MCH 27.7 27.9 27.0 27.9 27.5  MCHC 30.3 30.6 30.0 30.4 31.0  RDW 16.9* 16.2* 16.1* 16.1* 15.8*  LYMPHSABS 0.9 0.7 0.9 1.1  --   MONOABS 1.0 0.7 0.9 0.8  --   EOSABS 0.3 0.2 0.3 0.3  --   BASOSABS 0.0 0.0 0.0 0.0  --     Electrolytes Recent Labs  Lab  06/21/22 0303 06/22/22 0425 06/23/22 0344 06/24/22 0444 06/25/22 0559 06/26/22 0300 06/27/22 0745  NA 142 138 138 143 140 141 140  K 4.4 3.8 3.9 4.6 3.5 4.1 3.6  CL 95* 97* 97* 97* 94* 94* 93*  CO2 38* 33* 35* 39* 37* 34* 38*  GLUCOSE 89 95 106* 92 91 93 93  BUN '19 15 18 19 20 23 17  '$ CREATININE 1.53* 1.16 1.28* 1.51* 1.27* 1.29* 1.19  CALCIUM 9.2 8.9 8.7* 9.0 9.0 9.1 8.7*  AST 19 16 14* 18  --   --   --   ALT '19 16 14 14  '$ --   --   --   ALKPHOS 47 45 44 47  --   --   --   BILITOT 1.3* 1.6* 1.1 0.8  --   --   --   ALBUMIN 3.3* 3.1* 3.0* 3.2*  --   --   --   MG 2.1 1.8 2.2 2.2  --   --   --   BNP 406.8* 431.3* 602.8*  --   --   --   --     ------------------------------------------------------------------------------------------------------------------ No results for input(s): "CHOL", "HDL", "LDLCALC", "TRIG", "CHOLHDL", "LDLDIRECT" in the last 72 hours.   No results found for: "HGBA1C"  No results for input(s): "TSH", "T4TOTAL", "T3FREE", "THYROIDAB" in the last 72 hours.  Invalid  input(s): "FREET3" ------------------------------------------------------------------------------------------------------------------ ID Labs Recent Labs  Lab 06/21/22 0303 06/22/22 0425 06/23/22 0344 06/24/22 0444 06/25/22 0559 06/26/22 0300 06/27/22 0745  WBC 7.0 4.8 5.4 6.0  --  5.8  --   PLT 175 164 178 168  --  176  --   CREATININE 1.53* 1.16 1.28* 1.51* 1.27* 1.29* 1.19   Cardiac Enzymes No results for input(s): "CKMB", "TROPONINI", "MYOGLOBIN" in the last 168 hours.  Invalid input(s): "CK"  Radiology Reports DG Chest Port 1 View  Result Date: 06/24/2022 CLINICAL DATA:  Hypoxia. EXAM: PORTABLE CHEST 1 VIEW COMPARISON:  June 21, 2022. FINDINGS: Stable cardiomegaly. Stable elevated left hemidiaphragm is noted. Minimal bibasilar subsegmental atelectasis is noted. Bony thorax is unremarkable. IMPRESSION: Stable elevated left hemidiaphragm. Minimal bibasilar subsegmental atelectasis. Electronically Signed   By: Marijo Conception M.D.   On: 06/24/2022 08:09

## 2022-06-27 NOTE — Progress Notes (Signed)
Notified by CCMD that pt HR dropped to 34 and then the pts rhythm changed.  Vitals obtained - pt asymptomatic.  MD notified.  New orders received.  Will continue to monitor.

## 2022-06-27 NOTE — TOC Progression Note (Signed)
Transition of Care [facility]) - Progression Note    Patient Details  Name: Tommy Bauer MRN: [id number] Date of Birth: Mar 07, 1941  [facility] CM/SW [place], LCSW Phone Number: 06/27/2022, 1:55 PM  Clinical Narrative:    CSW notified by MD to cancel discharge. CSW updated [place].    Expected Discharge Plan: [place] Barriers to Discharge: Continued Medical Work up  Expected Discharge Plan and Services Expected Discharge Plan: [place] Choice: [place] arrangements for the past 2 months: [place] Expected Discharge Date: 06/27/22                                     Social Determinants of Health (SDOH) Interventions    Readmission Risk Interventions     No data to display

## 2022-06-27 NOTE — Discharge Summary (Signed)
Physician Discharge Summary  Cid Agena WIO:[id number] DOB: [birth year] DOA: 06/18/2022  PCP: Garwin Brothers, MD  Admit date: 06/18/2022 Discharge date: 06/27/2022  Admitted From: SNF Disposition:  SNF  Recommendations for Outpatient Follow-up:  Check CBC, BMP and 3 days, stable potassium level as he is started on Entresto and Aldactone. Patient to follow with cardiology as an outpatient regarding the diagnosis of acute systolic CHF Please keep encouraging to use incentive spirometry and flutter valve, patient on 2 L nasal cannula as needed, wean as tolerated    Discharge Condition:Stable CODE STATUS:FULL, Diet recommendation: Heart Healthy  Brief/Interim Summary:  81 y.o. male with medical history significant of hypertension, hyperlipidemia, CVA, central retinal artery occlusion, prostate caner s/p radiation, progressive dementia and prior tobacco abuse who presented after being found to be hypoxic by facility staff. Normally he was not on oxygen. His ex wife who comes to visit him regularly notes that his memory has been on the decline, but he is able to recognize her and their three children.  She is states he usually does not remember his age and currently cannot tell me why he is here.  He sustained a wound on the right side of his head after being hospitalized for abscess and cellulitis of the scalp last year.  Ever since that time he has been at the [place] care.   His work-up was consistent with pneumonia and  CHF, he was seen by cardiology and admitted to the hospital.please see discussion below.  Acute hypoxic respiratory failure secondary to pneumonia and acute on chronic combined CHF.  - He had subjective fevers, leukocytosis, elevated procalcitonin along with CT evidence of possible right lower lobe pneumonia.  -  His work-up is also consistent with fluid overload and CHF decompensation. -Treated initially with IV antibiotics, improving, he is transitioned to oral regimen  currently .  Further need of antibiotics on discharge. - seen by SLP.  Encouraged to sit up in chair in the daytime use I-S and flutter valve for pulmonary toiletry. -As well he remains with poor inspiratory effort, and left elevated hemidiaphragm which contributing to his hypoxia and oxygen requirement, he is currently on 2 L nasal cannula as needed, please keep encouraging to use incentive spirometer and flutter valve at facility.   Acute on chronic combined systolic and diastolic CHF.   -Last known EF 55%, EF has now  dropped to 35% with wall motion abnormalities. -Cardiology input greatly appreciated, difficult volume overload where he received significant IV diuresis, overall his -15 L during hospital stay, medication has been adjusted by cardiology, he will be discharged on Jardiance, Coreg, Entresto and Aldactone , no he is appropriately diuresed to continue home dose Lasix 40 mg oral daily, I have lowered his potassium supplements as an outpatient given he is started on Entresto and Aldactone.       Non-ACS pattern elevation of troponin.  Trend is flat likely due to demand mismatch caused by hypoxia from #1 above.  Continue aspirin, low-dose beta-blocker if tolerated by blood pressure and heart rate, continue statin for secondary prevention.   Gradually progressive dementia.  At risk for delirium.  Minimize narcotics and benzodiazepines.  PT, OT.  Post discharge go back to SNF.   Dyslipidemia.  On statin.   Hx of stroke.  Continue combination of aspirin and statin for secondary prevention.   History of prostate cancer.  Patient s/p radiation treatment about 11 years ago now thought to be in remission.  Outpatient follow-up by PCP.  AKI on CKD stage III A.  Baseline creatinine around 1.2.  Monitor.  Renal function back to baseline.        Discharge Diagnoses:  Principal Problem:   Acute respiratory failure with hypoxia and hypercapnia (HCC) Active Problems:   Sepsis due to  pneumonia (HCC)   (HFpEF) heart failure with preserved ejection fraction (HCC)   Elevated troponin   Abnormal EKG   Dementia without behavioral disturbance (HCC)   Essential hypertension   Hyperlipidemia   History of stroke   Acute on chronic diastolic heart failure [facility])    Discharge Instructions  Discharge Instructions     Diet - low sodium heart healthy   Complete by: As directed    Discharge instructions   Complete by: As directed    Follow with Primary MD Garwin Brothers, MD /SNF physician  Get CBC, CMP, checked  by Primary MD next visit.    Activity: As tolerated with Full fall precautions use [place]/cane & assistance as needed   Disposition SNF   Diet: Heart Healthy , with feeding assistance and aspiration precautions.  For Heart failure patients - Check your Weight same time everyday, if you gain over 2 pounds, or you develop in leg swelling, experience more shortness of breath or chest pain, call your Primary MD immediately. Follow Cardiac Low Salt Diet and 1.5 lit/day fluid restriction.   On your next visit with your primary care physician please Get Medicines reviewed and adjusted.   Please request your Prim.MD to go over all Hospital Tests and Procedure/Radiological results at the follow up, please get all Hospital records sent to your Prim MD by signing hospital release before you go home.   If you experience worsening of your admission symptoms, develop shortness of breath, life threatening emergency, suicidal or homicidal thoughts you must seek medical attention immediately by calling 911 or calling your MD immediately  if symptoms less severe.  You Must read complete instructions/literature along with all the possible adverse reactions/side effects for all the Medicines you take and that have been prescribed to you. Take any new Medicines after you have completely understood and accpet all the possible adverse reactions/side effects.   Do not drive, operating  heavy machinery, perform activities at heights, swimming or participation in water activities or provide baby sitting services if your were admitted for syncope or siezures until you have seen by Primary MD or a Neurologist and advised to do so again.  Do not drive when taking Pain medications.    Do not take more than prescribed Pain, Sleep and Anxiety Medications  Special Instructions: If you have smoked or chewed Tobacco  in the last 2 yrs please stop smoking, stop any regular Alcohol  and or any Recreational drug use.  Wear Seat belts while driving.   Please note  You were cared for by a hospitalist during your hospital stay. If you have any questions about your discharge medications or the care you received while you were in the hospital after you are discharged, you can call the unit and asked to speak with the hospitalist on call if the hospitalist that took care of you is not available. Once you are discharged, your primary care physician will handle any further medical issues. Please note that NO REFILLS for any discharge medications will be authorized once you are discharged, as it is imperative that you return to your primary care physician (or establish a relationship with a primary care physician if you do not have one)  for your aftercare needs so that they can reassess your need for medications and monitor your lab values.   Increase activity slowly   Complete by: As directed       Allergies as of 06/27/2022   No Known Allergies      Medication List     STOP taking these medications    hydrALAZINE 25 MG tablet Commonly known as: APRESOLINE   POTASSIUM CHLORIDE PO       TAKE these medications    acetaminophen 500 MG tablet Commonly known as: TYLENOL Take 500 mg by mouth every 12 (twelve) hours.   albuterol (2.5 MG/3ML) 0.083% nebulizer solution Commonly known as: PROVENTIL Take 3 mLs (2.5 mg total) by nebulization every 2 (two) hours as needed for wheezing or  shortness of breath.   aspirin 81 MG tablet Take 1 tablet (81 mg total) by mouth daily.   atorvastatin 40 MG tablet Commonly known as: LIPITOR Take 1 tablet (40 mg total) by mouth daily. What changed: when to take this   carvedilol 25 MG tablet Commonly known as: COREG Take 1 tablet (25 mg total) by mouth 2 (two) times daily with a meal.   empagliflozin 10 MG Tabs tablet Commonly known as: JARDIANCE Take 1 tablet (10 mg total) by mouth daily. Start taking on: June 28, 7168   folic acid 1 MG tablet Commonly known as: FOLVITE Take 1 mg by mouth daily.   furosemide 40 MG tablet Commonly known as: LASIX Take 40 mg by mouth daily.   latanoprost 0.005 % ophthalmic solution Commonly known as: XALATAN Place 1 drop into both eyes at bedtime.   pantoprazole 40 MG tablet Commonly known as: PROTONIX Take 1 tablet (40 mg total) by mouth daily. Start taking on: June 28, 2022   potassium chloride SA 20 MEQ tablet Commonly known as: KLOR-CON M Take 1 tablet (20 mEq total) by mouth 2 (two) times daily.   sacubitril-valsartan 24-26 MG Commonly known as: ENTRESTO Take 1 tablet by mouth 2 (two) times daily.   Simethicone 125 MG Caps Take 125 mg by mouth in the morning, at noon, and at bedtime.   spironolactone 25 MG tablet Commonly known as: ALDACTONE Take 0.5 tablets (12.5 mg total) by mouth daily. Start taking on: June 28, 2022   thiamine 100 MG tablet Commonly known as: VITAMIN B1 Take 100 mg by mouth daily.        Follow-up Information     Werner Lean, MD Follow up.   Specialty: Cardiology Contact information: [address] [place] [id number] [phone]                No Known Allergies  Consultations: Cardiology   Procedures/Studies: [facility] Chest Port 1 View  Result Date: 06/24/2022 CLINICAL DATA:  Hypoxia. EXAM: PORTABLE CHEST 1 VIEW COMPARISON:  June 21, 2022. FINDINGS: Stable cardiomegaly. Stable elevated left  hemidiaphragm is noted. Minimal bibasilar subsegmental atelectasis is noted. Bony thorax is unremarkable. IMPRESSION: Stable elevated left hemidiaphragm. Minimal bibasilar subsegmental atelectasis. Electronically Signed   By: Marijo Conception M.D.   On: 06/24/2022 08:09   DG Chest Port 1 View  Result Date: 06/21/2022 CLINICAL DATA:  81 year old male with history of acute respiratory failure with hypoxia and hypercapnia. EXAM: PORTABLE CHEST 1 VIEW COMPARISON:  Chest x-ray 06/18/2022. FINDINGS: Elevation of the left hemidiaphragm. Opacity at the left base which may reflect atelectasis and/or consolidation. Possible small left pleural effusion. No definite right pleural effusion. No pneumothorax.  No evidence of pulmonary edema. Heart size is mildly enlarged. Upper mediastinal contours are within normal limits. IMPRESSION: 1. Chronic elevation of the left hemidiaphragm with atelectasis and/or consolidation in the left lung base, with potential small left pleural effusion. 2. Cardiomegaly. Electronically Signed   By: Vinnie Langton M.D.   On: 06/21/2022 06:41   ECHOCARDIOGRAM COMPLETE  Result Date: 06/19/2022    ECHOCARDIOGRAM REPORT   Patient Name:   KASAI BELTRAN Date of Exam: 06/19/2022 Medical Rec #:  [id number]   Height:       73.0 in Accession #:    [id number]  Weight:       228.6 lb Date of Birth:  30-Jan-1941   BSA:          2.277 m Patient Age:    17 years    BP:           129/75 mmHg Patient Gender: M           HR:           79 bpm. Exam Location:  Inpatient Procedure: 2D Echo, Cardiac Doppler, Color Doppler and Intracardiac            Opacification Agent Indications:    Cardiomegaly  History:        Patient has prior history of Echocardiogram examinations, most                 recent 09/14/2020. Stroke; Risk Factors:Former Smoker and                 Hypertension. Cancer.  Sonographer:    Eartha Inch Referring Phys: [id number] RONDELL A SMITH  Sonographer Comments: Technically difficult study due to poor  echo windows. Image acquisition challenging due to patient body habitus. IMPRESSIONS  1. Left ventricular ejection fraction, by estimation, is 30 to 35%. The left ventricle has moderately decreased function. The left ventricle demonstrates regional wall motion abnormalities (see scoring diagram/findings for description). Indeterminate diastolic filling due to E-A fusion. There is severe hypokinesis of the left ventricular, entire inferoseptal wall and inferior wall.  2. Right ventricular systolic function is severely reduced. The right ventricular size is severely enlarged. Tricuspid regurgitation signal is inadequate for assessing PA pressure.  3. Left atrial size was mildly dilated.  4. Right atrial size was mildly dilated.  5. The mitral valve is normal in structure. Trivial mitral valve regurgitation.  6. The aortic valve is tricuspid. There is mild calcification of the aortic valve. Aortic valve regurgitation is not visualized. Aortic valve sclerosis/calcification is present, without any evidence of aortic stenosis.  7. The inferior vena cava is dilated in size with <50% respiratory variability, suggesting right atrial pressure of 15 mmHg. FINDINGS  Left Ventricle: Left ventricular ejection fraction, by estimation, is 30 to 35%. The left ventricle has moderately decreased function. The left ventricle demonstrates regional wall motion abnormalities. Severe hypokinesis of the left ventricular, entire  inferoseptal wall and inferior wall. Definity contrast agent was given IV to delineate the left ventricular endocardial borders. The left ventricular internal cavity size was normal in size. There is no left ventricular hypertrophy. Abnormal (paradoxical) septal motion, consistent with left bundle branch block. Indeterminate diastolic filling due to E-A fusion. Right Ventricle: The right ventricular size is severely enlarged. Right vetricular wall thickness was not well visualized. Right ventricular systolic  function is severely reduced. Tricuspid regurgitation signal is inadequate for assessing PA pressure. The tricuspid regurgitant velocity is 2.31 m/s, and with an assumed right atrial pressure  of 15 mmHg, the estimated right ventricular systolic pressure is 54.2 mmHg. Left Atrium: Left atrial size was mildly dilated. Right Atrium: Right atrial size was mildly dilated. Pericardium: There is no evidence of pericardial effusion. Mitral Valve: The mitral valve is normal in structure. Trivial mitral valve regurgitation. Tricuspid Valve: The tricuspid valve is normal in structure. Tricuspid valve regurgitation is mild. Aortic Valve: The aortic valve is tricuspid. There is mild calcification of the aortic valve. Aortic valve regurgitation is not visualized. Aortic valve sclerosis/calcification is present, without any evidence of aortic stenosis. Pulmonic Valve: The pulmonic valve was not well visualized. Pulmonic valve regurgitation is not visualized. Aorta: The aortic root is normal in size and structure. Venous: The inferior vena cava is dilated in size with less than 50% respiratory variability, suggesting right atrial pressure of 15 mmHg. IAS/Shunts: The interatrial septum was not well visualized.  LEFT VENTRICLE PLAX 2D LVIDd:         5.10 cm   Diastology LVIDs:         4.20 cm   LV e' medial:    8.03 cm/s LV PW:         1.00 cm   LV E/e' medial:  14.1 LV IVS:        0.80 cm   LV e' lateral:   15.60 cm/s LVOT diam:     2.60 cm   LV E/e' lateral: 7.2 LV SV:         66 LV SV Index:   29 LVOT Area:     5.31 cm  RIGHT VENTRICLE            IVC RV S prime:     4.95 cm/s  IVC diam: 2.40 cm TAPSE (M-mode): 1.1 cm LEFT ATRIUM             Index        RIGHT ATRIUM           Index LA diam:        4.00 cm 1.76 cm/m   RA Area:     22.60 cm LA Vol (A2C):   45.8 ml 20.11 ml/m  RA Volume:   80.00 ml  35.13 ml/m LA Vol (A4C):   42.8 ml 18.79 ml/m LA Biplane Vol: 44.6 ml 19.58 ml/m  AORTIC VALVE LVOT Vmax:   77.50 cm/s LVOT  Vmean:  59.800 cm/s LVOT VTI:    0.125 m  AORTA Ao Root diam: 3.50 cm MITRAL VALVE                TRICUSPID VALVE MV Area (PHT): 4.36 cm     TR Peak grad:   21.3 mmHg MV Decel Time: 174 msec     TR Vmax:        231.00 cm/s MV E velocity: 113.00 cm/s                             SHUNTS                             Systemic VTI:  0.12 m                             Systemic Diam: 2.60 cm Dani Gobble Croitoru MD Electronically signed by Sanda Klein MD Signature Date/Time: 06/19/2022/2:14:58 PM    Final    CT Angio Chest  PE W and/or Wo Contrast  Result Date: 06/18/2022 CLINICAL DATA:  Pulmonary embolism (PE) suspected, high prob Shortness of breath.  Lethargy. EXAM: CT ANGIOGRAPHY CHEST WITH CONTRAST TECHNIQUE: Multidetector CT imaging of the chest was performed using the standard protocol during bolus administration of intravenous contrast. Multiplanar CT image reconstructions and MIPs were obtained to evaluate the vascular anatomy. RADIATION DOSE REDUCTION: This exam was performed according to the departmental dose-optimization program which includes automated exposure control, adjustment of the mA and/or kV according to patient size and/or use of iterative reconstruction technique. CONTRAST:  81m OMNIPAQUE IOHEXOL 350 MG/ML SOLN COMPARISON:  Radiograph earlier today. FINDINGS: Cardiovascular: Examination is diagnostic to the level of the segmental pulmonary arteries. The subsegmental branches are not well assessed. Allowing for this limitation, there are no filling defects within the pulmonary arteries to suggest pulmonary embolus. Dilated main pulmonary artery 3.7 cm. Moderate multi chamber cardiomegaly. No pericardial effusion. There are coronary artery calcifications. Atherosclerosis of the thoracic aorta. No aneurysm. Cannot assess for dissection given phase of contrast tailored to pulmonary artery assessment. Mediastinum/Nodes: Mild multifocal mediastinal and hilar adenopathy and shotty lymph nodes. For example  AP window node measures 14 mm, series 5, image 43. right hilar node measures 13 mm, series 5, image 60. Left hilar nodes measure up to 13 mm series 5, image 64. The esophagus is decompressed. No visualized thyroid nodule. No bulky axillary adenopathy. Lungs/Pleura: Elevated left hemidiaphragm with adjacent compressive atelectasis in the lingula and left lower lobe. There is also mild dependent atelectasis within both lungs. Ill-defined opacity in the medial aspect of the right lower lobe. No pleural fluid. No features of pulmonary edema. Minimal retained mucus in the trachea. Imaging obtained in expiration limiting assessment of the trachea and central bronchi. No pulmonary mass. Upper Abdomen: No acute findings. Simple left renal cyst. No follow-up imaging is needed. Musculoskeletal: Thoracic spondylosis with multilevel endplate spurring. Prominent Schmorl's node involves superior endplate of TY65 Review of the MIP images confirms the above findings. IMPRESSION: 1. No pulmonary embolus. 2. Ill-defined opacity in the medial aspect of the right lower lobe may be atelectasis or pneumonia. 3. Moderate multi chamber cardiomegaly with coronary artery calcifications. 4. Dilated main pulmonary artery suggesting pulmonary arterial hypertension. 5. Mild multifocal mediastinal and hilar adenopathy and shotty lymph nodes, likely reactive. 6. Elevated left hemidiaphragm with adjacent compressive atelectasis in the lingula and left lower lobe. Aortic Atherosclerosis (ICD10-I70.0). Electronically Signed   By: MKeith RakeM.D.   On: 06/18/2022 16:14   DG Chest Portable 1 View  Result Date: 06/18/2022 CLINICAL DATA:  Shortness of breath. EXAM: PORTABLE CHEST 1 VIEW COMPARISON:  09/13/2020 FINDINGS: There is rotational artifact. Apparent cardiac enlargement may reflect rotational artifact. Pulmonary vascular congestion is noted. Unchanged chronic asymmetric elevation of the left hemidiaphragm with overlying scar versus  atelectasis. No signs pleural effusion or frank interstitial edema. No airspace opacities. IMPRESSION: 1. Cardiac enlargement, increased from the previous exam. 2. Pulmonary vascular congestion. 3. Chronic asymmetric elevation of the left hemidiaphragm with overlying atelectasis or scar. Electronically Signed   By: TKerby MoorsM.D.   On: 06/18/2022 12:38      Subjective: No significant events overnight, he denies any complaints today  Discharge Exam: Vitals:   06/27/22 0800 06/27/22 1200  BP: 122/72 109/69  Pulse: 63 (!) 59  Resp: 18 (!) 21  Temp: 98 F (36.7 C) 98.3 F (36.8 C)  SpO2: 92% 94%   Vitals:   06/27/22 0317 06/27/22 0446 06/27/22 0800 06/27/22  1200  BP: 103/63  122/72 109/69  Pulse: 60  63 (!) 59  Resp: 19  18 (!) 21  Temp: 98.5 F (36.9 C)  98 F (36.7 C) 98.3 F (36.8 C)  TempSrc: Oral  Axillary Oral  SpO2: 94%  92% 94%  Weight:  91.2 kg    Height:        General: Pt is alert, awake, pleasant, communicative, Cardiovascular: RRR, S1/S2 +, no rubs, no gallops Respiratory: CTA bilaterally, no wheezing, no rhonchi Abdominal: Soft, NT, ND, bowel sounds + Extremities: no edema, no cyanosis    The results of significant diagnostics from this hospitalization (including imaging, microbiology, ancillary and laboratory) are listed below for reference.     Microbiology: Recent Results (from the past 240 hour(s))  Resp Panel by RT-PCR (Flu A&B, Covid) Anterior Nasal Swab     Status: None   Collection Time: 06/18/22 11:21 AM   Specimen: Anterior Nasal Swab  Result Value Ref Range Status   SARS Coronavirus 2 by RT PCR NEGATIVE NEGATIVE Final    Comment: (NOTE) SARS-CoV-2 target nucleic acids are NOT DETECTED.  The SARS-CoV-2 RNA is generally detectable in upper respiratory specimens during the acute phase of infection. The lowest concentration of SARS-CoV-2 viral copies this assay can detect is 138 copies/mL. A negative result does not preclude  SARS-Cov-2 infection and should not be used as the sole basis for treatment or other patient management decisions. A negative result may occur with  improper specimen collection/handling, submission of specimen other than nasopharyngeal swab, presence of viral mutation(s) within the areas targeted by this assay, and inadequate number of viral copies(<138 copies/mL). A negative result must be combined with clinical observations, patient history, and epidemiological information. The expected result is Negative.  Fact Sheet for Patients:  EntrepreneurPulse.com.au  Fact Sheet for Healthcare Providers:  IncredibleEmployment.be  This test is no t yet approved or cleared by the [place] FDA and  has been authorized for detection and/or diagnosis of SARS-CoV-2 by FDA under an Emergency Use Authorization (EUA). This EUA will remain  in effect (meaning this test can be used) for the duration of the COVID-19 declaration under Section 564(b)(1) of the Act, 21 U.S.C.section 360bbb-3(b)(1), unless the authorization is terminated  or revoked sooner.       Influenza A by PCR NEGATIVE NEGATIVE Final   Influenza B by PCR NEGATIVE NEGATIVE Final    Comment: (NOTE) The Xpert Xpress SARS-CoV-2/FLU/RSV plus assay is intended as an aid in the diagnosis of influenza from Nasopharyngeal swab specimens and should not be used as a sole basis for treatment. Nasal washings and aspirates are unacceptable for Xpert Xpress SARS-CoV-2/FLU/RSV testing.  Fact Sheet for Patients: EntrepreneurPulse.com.au  Fact Sheet for Healthcare Providers: IncredibleEmployment.be  This test is not yet approved or cleared by the [place] FDA and has been authorized for detection and/or diagnosis of SARS-CoV-2 by FDA under an Emergency Use Authorization (EUA). This EUA will remain in effect (meaning this test can be used) for the duration of  the COVID-19 declaration under Section 564(b)(1) of the Act, 21 U.S.C. section 360bbb-3(b)(1), unless the authorization is terminated or revoked.  Performed at [facility], [place] [address], [place], [place] [address]   Culture, blood (Routine X 2) w Reflex to ID Panel     Status: None   Collection Time: 06/18/22  6:27 PM   Specimen: BLOOD  Result Value Ref Range Status   Specimen Description BLOOD LEFT ANTECUBITAL  Final  Special Requests   Final    BOTTLES DRAWN AEROBIC AND ANAEROBIC Blood Culture results may not be optimal due to an inadequate volume of blood received in culture bottles   Culture   Final    NO GROWTH 5 DAYS Performed at [facility], [place] [address] [place], [place] [address]    Report Status 06/23/2022 FINAL  Final  Culture, blood (Routine X 2) w Reflex to ID Panel     Status: None   Collection Time: 06/18/22  8:11 PM   Specimen: BLOOD  Result Value Ref Range Status   Specimen Description BLOOD SITE NOT SPECIFIED  Final   Special Requests   Final    BOTTLES DRAWN AEROBIC AND ANAEROBIC Blood Culture adequate volume   Culture   Final    NO GROWTH 5 DAYS Performed at [facility], [place] [address], [place], [place] [address]    Report Status 06/23/2022 FINAL  Final  MRSA Next Gen by PCR, Nasal     Status: None   Collection Time: 06/18/22  8:53 PM   Specimen: Nasal Mucosa; Nasal Swab  Result Value Ref Range Status   MRSA by PCR Next Gen NOT DETECTED NOT DETECTED Final    Comment: (NOTE) The GeneXpert MRSA Assay (FDA approved for NASAL specimens only), is one component of a comprehensive MRSA colonization surveillance program. It is not intended to diagnose MRSA infection nor to guide or monitor treatment for MRSA infections. Test performance is not FDA approved in patients less than 55 years old. Performed at [facility], [place] [address] [place], [place] [address]      Labs: BNP (last 3 results) Recent Labs    06/21/22 0303  06/22/22 0425 06/23/22 0344  BNP 406.8* 431.3* 742.5*   Basic Metabolic Panel: Recent Labs  Lab 06/21/22 0303 06/22/22 0425 06/23/22 0344 06/24/22 0444 06/25/22 0559 06/26/22 0300 06/27/22 0745  NA 142 138 138 143 140 141 140  K 4.4 3.8 3.9 4.6 3.5 4.1 3.6  CL 95* 97* 97* 97* 94* 94* 93*  CO2 38* 33* 35* 39* 37* 34* 38*  GLUCOSE 89 95 106* 92 91 93 93  BUN '19 15 18 19 20 23 17  '$ CREATININE 1.53* 1.16 1.28* 1.51* 1.27* 1.29* 1.19  CALCIUM 9.2 8.9 8.7* 9.0 9.0 9.1 8.7*  MG 2.1 1.8 2.2 2.2  --   --   --    Liver Function Tests: Recent Labs  Lab 06/21/22 0303 06/22/22 0425 06/23/22 0344 06/24/22 0444  AST 19 16 14* 18  ALT '19 16 14 14  '$ ALKPHOS 47 45 44 47  BILITOT 1.3* 1.6* 1.1 0.8  PROT 6.7 6.1* 6.3* 6.6  ALBUMIN 3.3* 3.1* 3.0* 3.2*   No results for input(s): "LIPASE", "AMYLASE" in the last 168 hours. No results for input(s): "AMMONIA" in the last 168 hours. CBC: Recent Labs  Lab 06/21/22 0303 06/22/22 0425 06/23/22 0344 06/24/22 0444 06/26/22 0300  WBC 7.0 4.8 5.4 6.0 5.8  NEUTROABS 4.8 3.1 3.3 3.7  --   HGB 14.9 14.5 13.7 14.4 15.4  HCT 49.1 47.4 45.7 47.3 49.6  MCV 91.4 91.2 90.1 91.5 88.6  PLT 175 164 178 168 176   Cardiac Enzymes: No results for input(s): "CKTOTAL", "CKMB", "CKMBINDEX", "TROPONINI" in the last 168 hours. BNP: Invalid input(s): "POCBNP" CBG: No results for input(s): "GLUCAP" in the last 168 hours. D-Dimer No results for input(s): "DDIMER" in the last 72 hours. Hgb A1c No results for input(s): "  HGBA1C" in the last 72 hours. Lipid Profile No results for input(s): "CHOL", "HDL", "LDLCALC", "TRIG", "CHOLHDL", "LDLDIRECT" in the last 72 hours. Thyroid function studies No results for input(s): "TSH", "T4TOTAL", "T3FREE", "THYROIDAB" in the last 72 hours.  Invalid input(s): "FREET3" Anemia work up No results for input(s): "VITAMINB12", "FOLATE", "FERRITIN", "TIBC", "IRON", "RETICCTPCT" in the last 72 hours. Urinalysis    Component  Value Date/Time   COLORURINE YELLOW 06/21/2022 1117   APPEARANCEUR CLEAR 06/21/2022 1117   LABSPEC 1.016 06/21/2022 1117   PHURINE 7.0 06/21/2022 1117   GLUCOSEU >=500 (A) 06/21/2022 1117   HGBUR NEGATIVE 06/21/2022 1117   BILIRUBINUR NEGATIVE 06/21/2022 1117   KETONESUR NEGATIVE 06/21/2022 1117   PROTEINUR NEGATIVE 06/21/2022 1117   NITRITE NEGATIVE 06/21/2022 1117   LEUKOCYTESUR NEGATIVE 06/21/2022 1117   Sepsis Labs Recent Labs  Lab 06/22/22 0425 06/23/22 0344 06/24/22 0444 06/26/22 0300  WBC 4.8 5.4 6.0 5.8   Microbiology Recent Results (from the past 240 hour(s))  Resp Panel by RT-PCR (Flu A&B, Covid) Anterior Nasal Swab     Status: None   Collection Time: 06/18/22 11:21 AM   Specimen: Anterior Nasal Swab  Result Value Ref Range Status   SARS Coronavirus 2 by RT PCR NEGATIVE NEGATIVE Final    Comment: (NOTE) SARS-CoV-2 target nucleic acids are NOT DETECTED.  The SARS-CoV-2 RNA is generally detectable in upper respiratory specimens during the acute phase of infection. The lowest concentration of SARS-CoV-2 viral copies this assay can detect is 138 copies/mL. A negative result does not preclude SARS-Cov-2 infection and should not be used as the sole basis for treatment or other patient management decisions. A negative result may occur with  improper specimen collection/handling, submission of specimen other than nasopharyngeal swab, presence of viral mutation(s) within the areas targeted by this assay, and inadequate number of viral copies(<138 copies/mL). A negative result must be combined with clinical observations, patient history, and epidemiological information. The expected result is Negative.  Fact Sheet for Patients:  EntrepreneurPulse.com.au  Fact Sheet for Healthcare Providers:  IncredibleEmployment.be  This test is no t yet approved or cleared by the [place] FDA and  has been authorized for detection and/or  diagnosis of SARS-CoV-2 by FDA under an Emergency Use Authorization (EUA). This EUA will remain  in effect (meaning this test can be used) for the duration of the COVID-19 declaration under Section 564(b)(1) of the Act, 21 U.S.C.section 360bbb-3(b)(1), unless the authorization is terminated  or revoked sooner.       Influenza A by PCR NEGATIVE NEGATIVE Final   Influenza B by PCR NEGATIVE NEGATIVE Final    Comment: (NOTE) The Xpert Xpress SARS-CoV-2/FLU/RSV plus assay is intended as an aid in the diagnosis of influenza from Nasopharyngeal swab specimens and should not be used as a sole basis for treatment. Nasal washings and aspirates are unacceptable for Xpert Xpress SARS-CoV-2/FLU/RSV testing.  Fact Sheet for Patients: EntrepreneurPulse.com.au  Fact Sheet for Healthcare Providers: IncredibleEmployment.be  This test is not yet approved or cleared by the [place] FDA and has been authorized for detection and/or diagnosis of SARS-CoV-2 by FDA under an Emergency Use Authorization (EUA). This EUA will remain in effect (meaning this test can be used) for the duration of the COVID-19 declaration under Section 564(b)(1) of the Act, 21 U.S.C. section 360bbb-3(b)(1), unless the authorization is terminated or revoked.  Performed at [facility], [place] [address] [place], [place] [address]   Culture, blood (Routine X 2) w Reflex to ID Panel  Status: None   Collection Time: 06/18/22  6:27 PM   Specimen: BLOOD  Result Value Ref Range Status   Specimen Description BLOOD LEFT ANTECUBITAL  Final   Special Requests   Final    BOTTLES DRAWN AEROBIC AND ANAEROBIC Blood Culture results may not be optimal due to an inadequate volume of blood received in culture bottles   Culture   Final    NO GROWTH 5 DAYS Performed at [facility], [place] [address], [place], [place] [address]    Report Status 06/23/2022 FINAL  Final  Culture, blood  (Routine X 2) w Reflex to ID Panel     Status: None   Collection Time: 06/18/22  8:11 PM   Specimen: BLOOD  Result Value Ref Range Status   Specimen Description BLOOD SITE NOT SPECIFIED  Final   Special Requests   Final    BOTTLES DRAWN AEROBIC AND ANAEROBIC Blood Culture adequate volume   Culture   Final    NO GROWTH 5 DAYS Performed at [facility], [place] [address], [place], [place] [address]    Report Status 06/23/2022 FINAL  Final  MRSA Next Gen by PCR, Nasal     Status: None   Collection Time: 06/18/22  8:53 PM   Specimen: Nasal Mucosa; Nasal Swab  Result Value Ref Range Status   MRSA by PCR Next Gen NOT DETECTED NOT DETECTED Final    Comment: (NOTE) The GeneXpert MRSA Assay (FDA approved for NASAL specimens only), is one component of a comprehensive MRSA colonization surveillance program. It is not intended to diagnose MRSA infection nor to guide or monitor treatment for MRSA infections. Test performance is not FDA approved in patients less than 19 years old. Performed at [facility], [place] [address] [place],  [address]      Time coordinating discharge: Over 30 minutes  SIGNED:   Phillips Climes, MD  [facility] 06/27/2022, 12:55 PM Pager   If 7PM-7AM, please contact night-coverage [email or web address] Password [email or web address]

## 2022-06-27 NOTE — Plan of Care (Signed)

## 2022-06-27 NOTE — Progress Notes (Signed)
Pt states he does not want to wear cpap tonight. Then spoke to the nurse and he stated to only use it in case of emergency due to Pt de-stating while wearing it.

## 2022-06-27 NOTE — NC FL2 (Signed)
[place] LEVEL OF CARE SCREENING TOOL     IDENTIFICATION  Patient Name: Tommy Bauer Birthdate: 12-02-1940 Sex: male Admission Date (Current Location): 06/18/2022  [facility] [place] and [place] Number:  Herbalist and Address:  [facility] [place], [place] [address] [place], [place] [address]      Provider Number: [id number]  Attending Physician Name and Address:  Elgergawy, Silver Huguenin, MD  Relative Name and Phone Number:       Current Level of Care: Hospital Recommended Level of Care: [place] Prior Approval Number:    Date Approved/Denied:   PASRR Number: [id number]  Discharge Plan: SNF    Current Diagnoses: Patient Active Problem List   Diagnosis Date Noted   Acute on chronic diastolic heart failure ([place])    Acute respiratory failure with hypoxia and hypercapnia ([place]) 06/18/2022   (HFpEF) heart failure with preserved ejection fraction ([place]) 06/18/2022   Elevated troponin 06/18/2022   Cerebrovascular disease 09/20/2021   PICC (peripherally inserted central catheter) in place 02/24/2021   Scalp wound 02/02/2021   Abscess or cellulitis of scalp 02/02/2021   Osteomyelitis ([place]) 02/02/2021   Bradycardia 02/02/2021   Hypoxia, sleep related 02/02/2021   Abnormal EKG 02/02/2021   Hyperlipidemia 02/02/2021   History of stroke 02/02/2021   Atrioventricular block, Mobitz type 1, Wenckebach    Dementia without behavioral disturbance ([place]) 12/14/2020   Gait abnormality 12/14/2020   Abnormal EEG 12/14/2020   Hypothermia 09/17/2020   Sepsis due to pneumonia ([place]) 09/13/2020   AMS (altered mental status) 09/13/2020   AKI (acute kidney injury) ([place]) 09/13/2020   Conjunctivitis 09/13/2020   Rhabdomyolysis 09/13/2020   Central retinal artery occlusion 07/15/2015   81 year-old gentleman s/p radical prostatectomy for pT3a adenocarcinoma with a Gleason's Score of 4+3, with extracapsular extension noted on pathology, and rising PSA of  0.36 05/19/2010   TOBACCO ABUSE 11/06/2009   CARPAL TUNNEL SYNDROME 10/15/2008   BACK PAIN 07/16/2008   INGUINAL HERNIA 12/04/2007   Cardiovascular disease 11/12/2007   ERECTILE DYSFUNCTION, ORGANIC 07/11/2007   HEADACHE 07/11/2007   Essential hypertension 06/08/2007   History of colonic polyps 06/08/2007    Orientation RESPIRATION BLADDER Height & Weight     Self  O2 (2L Nasal Cannula) Incontinent, External catheter Weight: 201 lb 1 oz (91.2 kg) Height:  '6\' 1"'$  (185.4 cm)  BEHAVIORAL SYMPTOMS/MOOD NEUROLOGICAL BOWEL NUTRITION STATUS      Incontinent Diet (See discharge summary.)  AMBULATORY STATUS COMMUNICATION OF NEEDS Skin   Limited Assist Verbally Normal                       Personal Care Assistance Level of Assistance  Bathing, Dressing, Feeding Bathing Assistance: Limited assistance Feeding assistance: Limited assistance Dressing Assistance: Limited assistance     Functional Limitations Info  Sight, Hearing Sight Info: Adequate Hearing Info: Impaired      SPECIAL CARE FACTORS FREQUENCY  PT (By licensed PT), OT (By licensed OT)     PT Frequency: 5x/week OT Frequency: 5x/week            Contractures Contractures Info: Not present    Additional Factors Info  Code Status, Allergies Code Status Info: Full code. Allergies Info: No known allergies.           Current Medications (06/27/2022):  This is the current hospital active medication list Current Facility-Administered Medications  Medication Dose Route Frequency Provider Last Rate Last Admin   acetaminophen (TYLENOL) tablet 650  mg  650 mg Oral Q6H PRN Fuller Plan A, MD   650 mg at 06/23/22 2138   Or   acetaminophen (TYLENOL) suppository 650 mg  650 mg Rectal Q6H PRN Fuller Plan A, MD       albuterol (PROVENTIL) (2.5 MG/3ML) 0.083% nebulizer solution 2.5 mg  2.5 mg Nebulization Q2H PRN Fuller Plan A, MD       aspirin EC tablet 81 mg  81 mg Oral Daily Smith, Rondell A, MD   81 mg at  06/27/22 0915   atorvastatin (LIPITOR) tablet 40 mg  40 mg Oral QPM Smith, Rondell A, MD   40 mg at 06/26/22 1728   butalbital-acetaminophen-caffeine (FIORICET) 50-325-40 MG per tablet 2 tablet  2 tablet Oral Q6H PRN Elgergawy, Silver Huguenin, MD   2 tablet at 06/22/22 1213   carvedilol (COREG) tablet 25 mg  25 mg Oral BID WC Janina Mayo, MD   25 mg at 06/27/22 0916   empagliflozin (JARDIANCE) tablet 10 mg  10 mg Oral Daily Thurnell Lose, MD   10 mg at 06/27/22 0916   enoxaparin (LOVENOX) injection 40 mg  40 mg Subcutaneous Q24H Smith, Rondell A, MD   40 mg at 78/67/67 2094   folic acid (FOLVITE) tablet 1 mg  1 mg Oral Daily Smith, Rondell A, MD   1 mg at 06/27/22 0916   furosemide (LASIX) tablet 40 mg  40 mg Oral Daily Tobb, Kardie, DO   40 mg at 06/27/22 0925   latanoprost (XALATAN) 0.005 % ophthalmic solution 1 drop  1 drop Both Eyes QHS Smith, Rondell A, MD   1 drop at 06/26/22 2024   pantoprazole (PROTONIX) EC tablet 40 mg  40 mg Oral Daily Thurnell Lose, MD   40 mg at 06/27/22 0915   potassium chloride (KLOR-CON) packet 20 mEq  20 mEq Oral BID Chandrasekhar, Mahesh A, MD   20 mEq at 06/27/22 7096   sacubitril-valsartan (ENTRESTO) 24-26 mg per tablet  1 tablet Oral BID Janina Mayo, MD   1 tablet at 06/27/22 0915   simethicone (MYLICON) chewable tablet 120 mg  120 mg Oral BID Fuller Plan A, MD   120 mg at 06/27/22 0915   sodium chloride flush (NS) 0.9 % injection 3 mL  3 mL Intravenous Q12H Smith, Rondell A, MD   3 mL at 06/27/22 2836   spironolactone (ALDACTONE) tablet 12.5 mg  12.5 mg Oral Daily Janina Mayo, MD   12.5 mg at 06/27/22 6294   thiamine (VITAMIN B1) tablet 100 mg  100 mg Oral Daily Fuller Plan A, MD   100 mg at 06/27/22 7654     Discharge Medications: Please see discharge summary for a list of discharge medications.  Relevant Imaging Results:  Relevant Lab Results:   Additional Information SSN: [id number]  Benard Halsted, LCSW

## 2022-06-27 NOTE — Discharge Instructions (Signed)
Follow with Primary MD Garwin Brothers, MD /SNF physician  Get CBC, CMP, checked  by Primary MD next visit.    Activity: As tolerated with Full fall precautions use Ganser/cane & assistance as needed   Disposition SNF   Diet: Heart Healthy , with feeding assistance and aspiration precautions.  For Heart failure patients - Check your Weight same time everyday, if you gain over 2 pounds, or you develop in leg swelling, experience more shortness of breath or chest pain, call your Primary MD immediately. Follow Cardiac Low Salt Diet and 1.5 lit/day fluid restriction.   On your next visit with your primary care physician please Get Medicines reviewed and adjusted.   Please request your Prim.MD to go over all Hospital Tests and Procedure/Radiological results at the follow up, please get all Hospital records sent to your Prim MD by signing hospital release before you go home.   If you experience worsening of your admission symptoms, develop shortness of breath, life threatening emergency, suicidal or homicidal thoughts you must seek medical attention immediately by calling 911 or calling your MD immediately  if symptoms less severe.  You Must read complete instructions/literature along with all the possible adverse reactions/side effects for all the Medicines you take and that have been prescribed to you. Take any new Medicines after you have completely understood and accpet all the possible adverse reactions/side effects.   Do not drive, operating heavy machinery, perform activities at heights, swimming or participation in water activities or provide baby sitting services if your were admitted for syncope or siezures until you have seen by Primary MD or a Neurologist and advised to do so again.  Do not drive when taking Pain medications.    Do not take more than prescribed Pain, Sleep and Anxiety Medications  Special Instructions: If you have smoked or chewed Tobacco  in the last 2 yrs please  stop smoking, stop any regular Alcohol  and or any Recreational drug use.  Wear Seat belts while driving.   Please note  You were cared for by a hospitalist during your hospital stay. If you have any questions about your discharge medications or the care you received while you were in the hospital after you are discharged, you can call the unit and asked to speak with the hospitalist on call if the hospitalist that took care of you is not available. Once you are discharged, your primary care physician will handle any further medical issues. Please note that NO REFILLS for any discharge medications will be authorized once you are discharged, as it is imperative that you return to your primary care physician (or establish a relationship with a primary care physician if you do not have one) for your aftercare needs so that they can reassess your need for medications and monitor your lab values.

## 2022-06-27 NOTE — Progress Notes (Signed)
Rounding Note    Patient Name: Tommy Bauer Date of Encounter: 06/27/2022  [place] Cardiologist: Werner Lean, MD   Subjective   Patient seen and examined his bedside. Total 24 our net output 2960m  Inpatient Medications    Scheduled Meds:  aspirin EC  81 mg Oral Daily   atorvastatin  40 mg Oral QPM   carvedilol  25 mg Oral BID WC   empagliflozin  10 mg Oral Daily   enoxaparin (LOVENOX) injection  40 mg Subcutaneous [id number]  folic acid  1 mg Oral Daily   furosemide  40 mg Intravenous BID   latanoprost  1 drop Both Eyes QHS   pantoprazole  40 mg Oral Daily   potassium chloride  20 mEq Oral BID   sacubitril-valsartan  1 tablet Oral BID   simethicone  120 mg Oral BID   sodium chloride flush  3 mL Intravenous Q12H   spironolactone  12.5 mg Oral Daily   thiamine  100 mg Oral Daily   Continuous Infusions:  PRN Meds: acetaminophen **OR** acetaminophen, albuterol, butalbital-acetaminophen-caffeine   Vital Signs    Vitals:   06/26/22 2000 06/26/22 2304 06/27/22 0317 06/27/22 0446  BP: 107/67 125/74 103/63   Pulse: 63 63 60   Resp: '19 20 19   '$ Temp: 98.6 F (37 C) 98.5 F (36.9 C) 98.5 F (36.9 C)   TempSrc: Oral Oral Oral   SpO2: 91% 91% 94%   Weight:    91.2 kg  Height:        Intake/Output Summary (Last 24 hours) at 06/27/2022 0837 Last data filed at 06/27/2022 0600 Gross per 24 hour  Intake --  Output 2900 ml  Net -2900 ml      06/27/2022    4:46 AM 06/24/2022    5:00 AM 06/22/2022    5:00 AM  Last 3 Weights  Weight (lbs) 201 lb 1 oz 213 lb 6.5 oz 218 lb 7.6 oz  Weight (kg) 91.2 kg 96.8 kg 99.1 kg      Telemetry     - Personally Reviewed  ECG    None today  - Personally Reviewed  Physical Exam   GEN: No acute distress.   Neck: No JVD Cardiac: RRR, no murmurs, rubs, or gallops.  Respiratory: Clear to auscultation bilaterally. GI: Soft, nontender, non-distended  MS: No edema; No deformity. Neuro:  Nonfocal  Psych:  Normal affect   Labs    High Sensitivity Troponin:   Recent Labs  Lab 06/18/22 1121 06/18/22 1302  TROPONINIHS 133* 134*     Chemistry Recent Labs  Lab 06/22/22 0425 06/23/22 0344 06/24/22 0444 06/25/22 0559 06/26/22 0300  NA 138 138 143 140 141  K 3.8 3.9 4.6 3.5 4.1  CL 97* 97* 97* 94* 94*  CO2 33* 35* 39* 37* 34*  GLUCOSE 95 106* 92 91 93  BUN '15 18 19 20 23  '$ CREATININE 1.16 1.28* 1.51* 1.27* 1.29*  CALCIUM 8.9 8.7* 9.0 9.0 9.1  MG 1.8 2.2 2.2  --   --   PROT 6.1* 6.3* 6.6  --   --   ALBUMIN 3.1* 3.0* 3.2*  --   --   AST 16 14* 18  --   --   ALT '16 14 14  '$ --   --   ALKPHOS 45 44 47  --   --   BILITOT 1.6* 1.1 0.8  --   --   GFRNONAA >60 56* 46* 57* 56*  ANIONGAP  $'8 6 7 9 13    'f$ Lipids No results for input(s): "CHOL", "TRIG", "HDL", "LABVLDL", "LDLCALC", "CHOLHDL" in the last 168 hours.  Hematology Recent Labs  Lab 06/23/22 0344 06/24/22 0444 06/26/22 0300  WBC 5.4 6.0 5.8  RBC 5.07 5.17 5.60  HGB 13.7 14.4 15.4  HCT 45.7 47.3 49.6  MCV 90.1 91.5 88.6  MCH 27.0 27.9 27.5  MCHC 30.0 30.4 31.0  RDW 16.1* 16.1* 15.8*  PLT 178 168 176   Thyroid No results for input(s): "TSH", "FREET4" in the last 168 hours.  BNP Recent Labs  Lab 06/21/22 0303 06/22/22 0425 06/23/22 0344  BNP 406.8* 431.3* 602.8*    DDimer No results for input(s): "DDIMER" in the last 168 hours.   Radiology    No results found.  Cardiac Studies   TTE 06/19/2022 IMPRESSIONS   1. Left ventricular ejection fraction, by estimation, is 30 to 35%. The left ventricle has moderately decreased function. The left ventricle demonstrates regional wall motion abnormalities (see scoring diagram/findings for description). Indeterminate  diastolic filling due to E-A fusion. There is severe hypokinesis of the left ventricular, entire inferoseptal wall and inferior wall.   2. Right ventricular systolic function is severely reduced. The right ventricular size is severely enlarged. Tricuspid  regurgitation signal is inadequate for assessing PA pressure.   3. Left atrial size was mildly dilated.   4. Right atrial size was mildly dilated.   5. The mitral valve is normal in structure. Trivial mitral valve regurgitation.   6. The aortic valve is tricuspid. There is mild calcification of the aortic valve. Aortic valve regurgitation is not visualized. Aortic valve sclerosis/calcification is present, without any evidence of aortic stenosis.   7. The inferior vena cava is dilated in size with <50% respiratory variability, suggesting right atrial pressure of 15 mmHg.   FINDINGS   Left Ventricle: Left ventricular ejection fraction, by estimation, is 30 to 35%. The left ventricle has moderately decreased function. The left ventricle demonstrates regional wall motion abnormalities. Severe hypokinesis of the left ventricular, entire   inferoseptal wall and inferior wall. Definity contrast agent was given IV to delineate the left ventricular endocardial borders. The left ventricular internal cavity size was normal in size. There is no left ventricular hypertrophy. Abnormal  (paradoxical) septal motion, consistent with left bundle branch block.  Indeterminate diastolic filling due to E-A fusion.   Right Ventricle: The right ventricular size is severely enlarged. Right  vetricular wall thickness was not well visualized. Right ventricular  systolic function is severely reduced. Tricuspid regurgitation signal is  inadequate for assessing PA pressure.  The tricuspid regurgitant velocity is 2.31 m/s, and with an assumed right  atrial pressure of 15 mmHg, the estimated right ventricular systolic  pressure is 62.8 mmHg.   Left Atrium: Left atrial size was mildly dilated.   Right Atrium: Right atrial size was mildly dilated.   Pericardium: There is no evidence of pericardial effusion.   Mitral Valve: The mitral valve is normal in structure. Trivial mitral  valve regurgitation.   Tricuspid Valve:  The tricuspid valve is normal in structure. Tricuspid  valve regurgitation is mild.   Aortic Valve: The aortic valve is tricuspid. There is mild calcification  of the aortic valve. Aortic valve regurgitation is not visualized. Aortic  valve sclerosis/calcification is present, without any evidence of aortic  stenosis.   Pulmonic Valve: The pulmonic valve was not well visualized. Pulmonic valve  regurgitation is not visualized.   Aorta: The aortic root is normal  in size and structure.   Venous: The inferior vena cava is dilated in size with less than 50%  respiratory variability, suggesting right atrial pressure of 15 mmHg.   IAS/Shunts: The interatrial septum was not well visualized.   Patient Profile     81 y.o. male medical history significant of hypertension, hyperlipidemia, CVA, central retinal artery occlusion, prostate caner s/p radiation, progressive dementia and prior tobacco abuse  admitted for respiratory failure and decompensated heart failure.  Assessment & Plan    Acute exacerbation of chronic heart failure with reduced ejection fraction-clinically improving.  We can transition to oral diuretics now. Continue his beta-blocker, Jardiance 10 mg a day, Entresto well as his Aldactone for his guideline directed medical therapy. Terms of ischemic work-up this can be done in the outpatient setting. Please keep mag above 2 and potassium above 4.   Acute respiratory failure with hypoxia secondary to PNA And exacebation of heart failure  HLN-continue atorvastatin 40 mg daily.  Hx of CVA-continue aspirin and statin  AKI on CKD-creatinine has improved appears to be at baseline.  Avoid nephrotoxins.     For questions or updates, please contact [place] Please consult [email or web address] for contact info under       Signed, Berniece Salines, DO  06/27/2022, 8:37 AM

## 2022-06-28 DIAGNOSIS — J189 Pneumonia, unspecified organism: Secondary | ICD-10-CM | POA: Diagnosis not present

## 2022-06-28 DIAGNOSIS — I5033 Acute on chronic diastolic (congestive) heart failure: Secondary | ICD-10-CM | POA: Diagnosis not present

## 2022-06-28 DIAGNOSIS — I441 Atrioventricular block, second degree: Secondary | ICD-10-CM | POA: Diagnosis not present

## 2022-06-28 DIAGNOSIS — J9601 Acute respiratory failure with hypoxia: Secondary | ICD-10-CM | POA: Diagnosis not present

## 2022-06-28 LAB — BASIC METABOLIC PANEL
Anion gap: 11 (ref 5–15)
BUN: 19 mg/dL (ref 8–23)
CO2: 37 mmol/L — ABNORMAL HIGH (ref 22–32)
Calcium: 9 mg/dL (ref 8.9–10.3)
Chloride: 92 mmol/L — ABNORMAL LOW (ref 98–111)
Creatinine, Ser: 1.41 mg/dL — ABNORMAL HIGH (ref 0.61–1.24)
GFR, Estimated: 50 mL/min — ABNORMAL LOW (ref >60)
Glucose, Bld: 95 mg/dL (ref 70–99)
Potassium: 3.8 mmol/L (ref 3.5–5.1)
Sodium: 140 mmol/L (ref 135–145)

## 2022-06-28 LAB — MAGNESIUM: Magnesium: 2.2 mg/dL (ref 1.7–2.4)

## 2022-06-28 NOTE — Progress Notes (Signed)
Rounding Note    Patient Name: Tommy Bauer Date of Encounter: 06/28/2022  [place] Cardiologist: Werner Lean, MD   Subjective   Patient seen and examined his bedside. He was sitting up in bed when I arrived.  Inpatient Medications    Scheduled Meds:  aspirin EC  81 mg Oral Daily   atorvastatin  40 mg Oral QPM   empagliflozin  10 mg Oral Daily   enoxaparin (LOVENOX) injection  40 mg Subcutaneous [id number]   folic acid  1 mg Oral Daily   furosemide  40 mg Oral Daily   latanoprost  1 drop Both Eyes QHS   pantoprazole  40 mg Oral Daily   potassium chloride  20 mEq Oral BID   sacubitril-valsartan  1 tablet Oral BID   simethicone  120 mg Oral BID   sodium chloride flush  3 mL Intravenous Q12H   spironolactone  12.5 mg Oral Daily   thiamine  100 mg Oral Daily   Continuous Infusions:  PRN Meds: acetaminophen **OR** acetaminophen, albuterol, butalbital-acetaminophen-caffeine   Vital Signs    Vitals:   06/28/22 0400 06/28/22 0500 06/28/22 0745 06/28/22 0800  BP: 117/71   109/70  Pulse: 63   66  Resp: 20   15  Temp: 98 F (36.7 C)  97.6 F (36.4 C)   TempSrc: Oral  Oral   SpO2: 92%   92%  Weight:  91.7 kg    Height:        Intake/Output Summary (Last 24 hours) at 06/28/2022 0834 Last data filed at 06/28/2022 0500 Gross per 24 hour  Intake --  Output 1550 ml  Net -1550 ml      06/28/2022    5:00 AM 06/27/2022    4:46 AM 06/24/2022    5:00 AM  Last 3 Weights  Weight (lbs) 202 lb 2.6 oz 201 lb 1 oz 213 lb 6.5 oz  Weight (kg) 91.7 kg 91.2 kg 96.8 kg      Telemetry     Evidence sinus rhythm HR in the 60s with some beats of second degree type I and on beat consistent with type II - Personally Reviewed  ECG    None today  - Personally Reviewed  Physical Exam   GEN: No acute distress.   Neck: No JVD Cardiac: RRR, no murmurs, rubs, or gallops.  Respiratory: Clear to auscultation bilaterally. GI: Soft, nontender, non-distended  MS: No  edema; No deformity. Neuro:  Nonfocal  Psych: Normal affect   Labs    High Sensitivity Troponin:   Recent Labs  Lab 06/18/22 1121 06/18/22 1302  TROPONINIHS 133* 134*     Chemistry Recent Labs  Lab 06/22/22 0425 06/23/22 0344 06/24/22 0444 06/25/22 0559 06/26/22 0300 06/27/22 0745 06/28/22 0321  NA 138 138 143   < > 141 140 140  K 3.8 3.9 4.6   < > 4.1 3.6 3.8  CL 97* 97* 97*   < > 94* 93* 92*  CO2 33* 35* 39*   < > 34* 38* 37*  GLUCOSE 95 106* 92   < > 93 93 95  BUN '15 18 19   '$ < > '23 17 19  '$ CREATININE 1.16 1.28* 1.51*   < > 1.29* 1.19 1.41*  CALCIUM 8.9 8.7* 9.0   < > 9.1 8.7* 9.0  MG 1.8 2.2 2.2  --   --   --  2.2  PROT 6.1* 6.3* 6.6  --   --   --   --  ALBUMIN 3.1* 3.0* 3.2*  --   --   --   --   AST 16 14* 18  --   --   --   --   ALT '16 14 14  '$ --   --   --   --   ALKPHOS 45 44 47  --   --   --   --   BILITOT 1.6* 1.1 0.8  --   --   --   --   GFRNONAA >60 56* 46*   < > 56* >60 50*  ANIONGAP '8 6 7   '$ < > '13 9 11   '$ < > = values in this interval not displayed.    Lipids No results for input(s): "CHOL", "TRIG", "HDL", "LABVLDL", "LDLCALC", "CHOLHDL" in the last 168 hours.  Hematology Recent Labs  Lab 06/23/22 0344 06/24/22 0444 06/26/22 0300  WBC 5.4 6.0 5.8  RBC 5.07 5.17 5.60  HGB 13.7 14.4 15.4  HCT 45.7 47.3 49.6  MCV 90.1 91.5 88.6  MCH 27.0 27.9 27.5  MCHC 30.0 30.4 31.0  RDW 16.1* 16.1* 15.8*  PLT 178 168 176   Thyroid No results for input(s): "TSH", "FREET4" in the last 168 hours.  BNP Recent Labs  Lab 06/22/22 0425 06/23/22 0344  BNP 431.3* 602.8*    DDimer No results for input(s): "DDIMER" in the last 168 hours.   Radiology    No results found.  Cardiac Studies   TTE 06/19/2022 IMPRESSIONS   1. Left ventricular ejection fraction, by estimation, is 30 to 35%. The left ventricle has moderately decreased function. The left ventricle demonstrates regional wall motion abnormalities (see scoring diagram/findings for description).  Indeterminate  diastolic filling due to E-A fusion. There is severe hypokinesis of the left ventricular, entire inferoseptal wall and inferior wall.   2. Right ventricular systolic function is severely reduced. The right ventricular size is severely enlarged. Tricuspid regurgitation signal is inadequate for assessing PA pressure.   3. Left atrial size was mildly dilated.   4. Right atrial size was mildly dilated.   5. The mitral valve is normal in structure. Trivial mitral valve regurgitation.   6. The aortic valve is tricuspid. There is mild calcification of the aortic valve. Aortic valve regurgitation is not visualized. Aortic valve sclerosis/calcification is present, without any evidence of aortic stenosis.   7. The inferior vena cava is dilated in size with <50% respiratory variability, suggesting right atrial pressure of 15 mmHg.   FINDINGS   Left Ventricle: Left ventricular ejection fraction, by estimation, is 30 to 35%. The left ventricle has moderately decreased function. The left ventricle demonstrates regional wall motion abnormalities. Severe hypokinesis of the left ventricular, entire   inferoseptal wall and inferior wall. Definity contrast agent was given IV to delineate the left ventricular endocardial borders. The left ventricular internal cavity size was normal in size. There is no left ventricular hypertrophy. Abnormal  (paradoxical) septal motion, consistent with left bundle branch block.  Indeterminate diastolic filling due to E-A fusion.   Right Ventricle: The right ventricular size is severely enlarged. Right  vetricular wall thickness was not well visualized. Right ventricular  systolic function is severely reduced. Tricuspid regurgitation signal is  inadequate for assessing PA pressure.  The tricuspid regurgitant velocity is 2.31 m/s, and with an assumed right  atrial pressure of 15 mmHg, the estimated right ventricular systolic  pressure is 60.4 mmHg.   Left Atrium:  Left atrial size was mildly dilated.   Right Atrium: Right  atrial size was mildly dilated.   Pericardium: There is no evidence of pericardial effusion.   Mitral Valve: The mitral valve is normal in structure. Trivial mitral  valve regurgitation.   Tricuspid Valve: The tricuspid valve is normal in structure. Tricuspid  valve regurgitation is mild.   Aortic Valve: The aortic valve is tricuspid. There is mild calcification  of the aortic valve. Aortic valve regurgitation is not visualized. Aortic  valve sclerosis/calcification is present, without any evidence of aortic  stenosis.   Pulmonic Valve: The pulmonic valve was not well visualized. Pulmonic valve  regurgitation is not visualized.   Aorta: The aortic root is normal in size and structure.   Venous: The inferior vena cava is dilated in size with less than 50%  respiratory variability, suggesting right atrial pressure of 15 mmHg.   IAS/Shunts: The interatrial septum was not well visualized.   Patient Profile     81 y.o. male medical history significant of hypertension, hyperlipidemia, CVA, central retinal artery occlusion, prostate caner s/p radiation, progressive dementia and prior tobacco abuse  admitted for respiratory failure and decompensated heart failure.  Assessment & Plan    Acute exacerbation of chronic heart failure with reduced ejection fraction-clinically improving. On oral diuretics Agree with holding his BB due to intermittent type II AV block. May need a monitor in the outpatient setting, he certainly may have sick sinus dysfunction but in this setting the beta blocker may be contributing.   Continue his Jardiance 10 mg a day, Entresto well as his Aldactone for his guideline directed medical therapy. Terms of ischemic work-up this can be done in the outpatient setting. Please keep mag above 2 and potassium above 4.   Acute respiratory failure with hypoxia secondary to PNA And exacebation of heart  failure  HLN-continue atorvastatin 40 mg daily.  Hx of CVA-continue aspirin and statin  AKI on CKD-creatinine has improved appears to be at baseline.  Avoid nephrotoxins.     Can be discharged from a cardiovascular standpoint.   For questions or updates, please contact [place] Please consult [email or web address] for contact info under       Signed, Berniece Salines, DO  06/28/2022, 8:34 AM

## 2022-06-28 NOTE — Care Management Important Message (Signed)
Important Message  Patient Details  Name: Tommy Bauer MRN: [id number] Date of Birth: 06-09-1941   Medicare Important Message Given:  Yes     Heath Badon [address] 06/28/2022, 3:47 PM

## 2022-06-28 NOTE — TOC Transition Note (Signed)
Transition of Care [facility]) - CM/SW Discharge Note   Patient Details  Name: Tommy Bauer MRN: [id number] Date of Birth: 12/29/1940  Transition of Care [facility] CM/SW Contact:  Benard Halsted, LCSW Phone Number: 06/28/2022, 12:09 PM   Clinical Narrative:    Patient will DC to: [place] Anticipated DC date: 06/28/22 Family notified: Spouse Transport by: Corey Harold   Per MD patient ready for DC to [facility]. RN to call report prior to discharge [phone]). RN, patient, patient's family, and facility notified of DC. Discharge Summary and FL2 sent to facility. DC packet on chart. Ambulance transport requested for patient.   CSW will sign off for now as social work intervention is no longer needed. Please consult [place] again if new needs arise.     Final next level of care: Skilled Nursing Facility Barriers to Discharge: Barriers Resolved   Patient Goals and CMS Choice Patient states their goals for this hospitalization and ongoing recovery are:: Pt disoriented and unable to participate in goal setting at this time. CMS Medicare.gov Compare Post Acute Care list provided to:: Patient Represenative (must comment) Lelon Frohlich (Spouse)) Choice offered to / list presented to : Spouse  Discharge Placement   Existing PASRR number confirmed : 06/28/22          Patient chooses bed at: [facility] Patient to be transferred to facility by: [place] Name of family member notified: Spouse Patient and family notified of of transfer: 06/28/22  Discharge Plan and Services     Post Acute Care Choice: [place]                               Social Determinants of Health (SDOH) Interventions     Readmission Risk Interventions     No data to display

## 2022-06-28 NOTE — Plan of Care (Signed)

## 2022-06-28 NOTE — Discharge Summary (Signed)
Physician Discharge Summary  Tommy Bauer NFA:[id number] DOB: Jan 19, 1941 DOA: 06/18/2022  PCP: Garwin Brothers, MD  Admit date: 06/18/2022 Discharge date: 06/28/2022  Admitted From: SNF Disposition:  SNF  Recommendations for Outpatient Follow-up:  Check CBC, BMP and 3 days, stable potassium level as he is started on Entresto and Aldactone. Patient to follow with cardiology as an outpatient regarding the diagnosis of acute systolic CHF Please keep encouraging to use incentive spirometry and flutter valve, patient on 2 L nasal cannula as needed, wean as tolerated    Discharge Condition:Stable CODE STATUS:FULL, Diet recommendation: Heart Healthy  Brief/Interim Summary:  81 y.o. male with medical history significant of hypertension, hyperlipidemia, CVA, central retinal artery occlusion, prostate caner s/p radiation, progressive dementia and prior tobacco abuse who presented after being found to be hypoxic by facility staff. Normally he was not on oxygen. His ex wife who comes to visit him regularly notes that his memory has been on the decline, but he is able to recognize her and their three children.  She is states he usually does not remember his age and currently cannot tell me why he is here.  He sustained a wound on the right side of his head after being hospitalized for abscess and cellulitis of the scalp last year.  Ever since that time he has been at the [facility] care.   His work-up was consistent with pneumonia and  CHF, he was seen by cardiology and admitted to the hospital.please see discussion below.  Acute hypoxic respiratory failure secondary to pneumonia and acute on chronic combined CHF.  - He had subjective fevers, leukocytosis, elevated procalcitonin along with CT evidence of possible right lower lobe pneumonia.  -  His work-up is also consistent with fluid overload and CHF decompensation. -Treated initially with IV antibiotics, improving, he is transitioned to oral regimen  currently .  Further need of antibiotics on discharge. - seen by SLP.  Encouraged to sit up in chair in the daytime use I-S and flutter valve for pulmonary toiletry. -As well he remains with poor inspiratory effort, and left elevated hemidiaphragm which contributing to his hypoxia and oxygen requirement, he is currently on 2 L nasal cannula as needed, please keep encouraging to use incentive spirometer and flutter valve at facility.   Acute on chronic combined systolic and diastolic CHF.   -Last known EF 55%, EF has now  dropped to 35% with wall motion abnormalities. -Cardiology input greatly appreciated, difficult volume overload where he received significant IV diuresis, overall his -15 L during hospital stay, medication has been adjusted by cardiology, he will be discharged on Jardiance, Entresto and Aldactone , no he is appropriately diuresed to continue home dose Lasix 40 mg oral daily, I have lowered his potassium supplements as an outpatient given he is started on Entresto and Aldactone.   -He was started on Coreg, but has been discontinued due to bradycardia, AV block, please see discussion below  Bradycardia, intermittent type II AV block -Patient was noted to have intermittent episodes of bradycardia with type II AV block, so Coreg has been discontinued per cardiology recommendation, patient may be having sick sinus dysfunction but it is in the setting of beta-blockers which may be contributing, so it had been discontinued.     Non-ACS pattern elevation of troponin.  Trend is flat likely due to demand mismatch caused by hypoxia from #1 above.  Continue aspirin, low-dose beta-blocker if tolerated by blood pressure and heart rate, continue statin for secondary prevention.  Gradually progressive dementia.  At risk for delirium.  Minimize narcotics and benzodiazepines.  PT, OT.  Post discharge go back to SNF.   Dyslipidemia.  On statin.   Hx of stroke.  Continue combination of aspirin and  statin for secondary prevention.   History of prostate cancer.  Patient s/p radiation treatment about 11 years ago now thought to be in remission.  Outpatient follow-up by PCP.   AKI on CKD stage III A.  Baseline creatinine around 1.2.  Monitor.  Renal function back to baseline.        Discharge Diagnoses:  Principal Problem:   Acute respiratory failure with hypoxia and hypercapnia (HCC) Active Problems:   Sepsis due to pneumonia (HCC)   (HFpEF) heart failure with preserved ejection fraction (HCC)   Elevated troponin   Abnormal EKG   Dementia without behavioral disturbance (HCC)   Essential hypertension   Hyperlipidemia   History of stroke   Acute on chronic diastolic heart failure [facility])    Discharge Instructions  Discharge Instructions     Diet - low sodium heart healthy   Complete by: As directed    Discharge instructions   Complete by: As directed    Follow with Primary MD Garwin Brothers, MD /SNF physician  Get CBC, CMP, checked  by Primary MD next visit.    Activity: As tolerated with Full fall precautions use [place]/cane & assistance as needed   Disposition SNF   Diet: Heart Healthy , with feeding assistance and aspiration precautions.  For Heart failure patients - Check your Weight same time everyday, if you gain over 2 pounds, or you develop in leg swelling, experience more shortness of breath or chest pain, call your Primary MD immediately. Follow Cardiac Low Salt Diet and 1.5 lit/day fluid restriction.   On your next visit with your primary care physician please Get Medicines reviewed and adjusted.   Please request your Prim.MD to go over all Hospital Tests and Procedure/Radiological results at the follow up, please get all Hospital records sent to your Prim MD by signing hospital release before you go home.   If you experience worsening of your admission symptoms, develop shortness of breath, life threatening emergency, suicidal or homicidal thoughts you  must seek medical attention immediately by calling 911 or calling your MD immediately  if symptoms less severe.  You Must read complete instructions/literature along with all the possible adverse reactions/side effects for all the Medicines you take and that have been prescribed to you. Take any new Medicines after you have completely understood and accpet all the possible adverse reactions/side effects.   Do not drive, operating heavy machinery, perform activities at heights, swimming or participation in water activities or provide baby sitting services if your were admitted for syncope or siezures until you have seen by Primary MD or a Neurologist and advised to do so again.  Do not drive when taking Pain medications.    Do not take more than prescribed Pain, Sleep and Anxiety Medications  Special Instructions: If you have smoked or chewed Tobacco  in the last 2 yrs please stop smoking, stop any regular Alcohol  and or any Recreational drug use.  Wear Seat belts while driving.   Please note  You were cared for by a hospitalist during your hospital stay. If you have any questions about your discharge medications or the care you received while you were in the hospital after you are discharged, you can call the unit and asked to speak with  the hospitalist on call if the hospitalist that took care of you is not available. Once you are discharged, your primary care physician will handle any further medical issues. Please note that NO REFILLS for any discharge medications will be authorized once you are discharged, as it is imperative that you return to your primary care physician (or establish a relationship with a primary care physician if you do not have one) for your aftercare needs so that they can reassess your need for medications and monitor your lab values.   Increase activity slowly   Complete by: As directed       Allergies as of 06/28/2022   No Known Allergies      Medication List      STOP taking these medications    hydrALAZINE 25 MG tablet Commonly known as: APRESOLINE   POTASSIUM CHLORIDE PO       TAKE these medications    acetaminophen 500 MG tablet Commonly known as: TYLENOL Take 500 mg by mouth every 12 (twelve) hours.   albuterol (2.5 MG/3ML) 0.083% nebulizer solution Commonly known as: PROVENTIL Take 3 mLs (2.5 mg total) by nebulization every 2 (two) hours as needed for wheezing or shortness of breath.   aspirin 81 MG tablet Take 1 tablet (81 mg total) by mouth daily.   atorvastatin 40 MG tablet Commonly known as: LIPITOR Take 1 tablet (40 mg total) by mouth daily. What changed: when to take this   empagliflozin 10 MG Tabs tablet Commonly known as: JARDIANCE Take 1 tablet (10 mg total) by mouth daily.   folic acid 1 MG tablet Commonly known as: FOLVITE Take 1 mg by mouth daily.   furosemide 40 MG tablet Commonly known as: LASIX Take 40 mg by mouth daily.   latanoprost 0.005 % ophthalmic solution Commonly known as: XALATAN Place 1 drop into both eyes at bedtime.   pantoprazole 40 MG tablet Commonly known as: PROTONIX Take 1 tablet (40 mg total) by mouth daily.   potassium chloride SA 20 MEQ tablet Commonly known as: KLOR-CON M Take 1 tablet (20 mEq total) by mouth 2 (two) times daily.   sacubitril-valsartan 24-26 MG Commonly known as: ENTRESTO Take 1 tablet by mouth 2 (two) times daily.   Simethicone 125 MG Caps Take 125 mg by mouth in the morning, at noon, and at bedtime.   spironolactone 25 MG tablet Commonly known as: ALDACTONE Take 0.5 tablets (12.5 mg total) by mouth daily.   thiamine 100 MG tablet Commonly known as: VITAMIN B1 Take 100 mg by mouth daily.        Contact information for follow-up providers     Werner Lean, MD Follow up.   Specialty: Cardiology Contact information: [address] [place] [address] [phone]              Contact information for  after-discharge care     Destination     [facility] Preferred SNF .   Service: Skilled Nursing Contact information: [address] [place] [phone]                    No Known Allergies  Consultations: Cardiology   Procedures/Studies: [facility] Chest Port 1 View  Result Date: 06/24/2022 CLINICAL DATA:  Hypoxia. EXAM: PORTABLE CHEST 1 VIEW COMPARISON:  June 21, 2022. FINDINGS: Stable cardiomegaly. Stable elevated left hemidiaphragm is noted. Minimal bibasilar subsegmental atelectasis is noted. Bony thorax is unremarkable. IMPRESSION: Stable elevated left hemidiaphragm.  Minimal bibasilar subsegmental atelectasis. Electronically Signed   By: Marijo Conception M.D.   On: 06/24/2022 08:09   DG Chest Port 1 View  Result Date: 06/21/2022 CLINICAL DATA:  81 year old male with history of acute respiratory failure with hypoxia and hypercapnia. EXAM: PORTABLE CHEST 1 VIEW COMPARISON:  Chest x-ray 06/18/2022. FINDINGS: Elevation of the left hemidiaphragm. Opacity at the left base which may reflect atelectasis and/or consolidation. Possible small left pleural effusion. No definite right pleural effusion. No pneumothorax. No evidence of pulmonary edema. Heart size is mildly enlarged. Upper mediastinal contours are within normal limits. IMPRESSION: 1. Chronic elevation of the left hemidiaphragm with atelectasis and/or consolidation in the left lung base, with potential small left pleural effusion. 2. Cardiomegaly. Electronically Signed   By: Vinnie Langton M.D.   On: 06/21/2022 06:41   ECHOCARDIOGRAM COMPLETE  Result Date: 06/19/2022    ECHOCARDIOGRAM REPORT   Patient Name:   Tommy Bauer Date of Exam: 06/19/2022 Medical Rec #:  [id number]   Height:       73.0 in Accession #:    [id number]  Weight:       228.6 lb Date of Birth:  [birth year]   BSA:          2.277 m Patient Age:    81 years    BP:           129/75 mmHg Patient Gender: M           HR:            79 bpm. Exam Location:  Inpatient Procedure: 2D Echo, Cardiac Doppler, Color Doppler and Intracardiac            Opacification Agent Indications:    Cardiomegaly  History:        Patient has prior history of Echocardiogram examinations, most                 recent 09/14/2020. Stroke; Risk Factors:Former Smoker and                 Hypertension. Cancer.  Sonographer:    Eartha Inch Referring Phys: [id number] RONDELL A SMITH  Sonographer Comments: Technically difficult study due to poor echo windows. Image acquisition challenging due to patient body habitus. IMPRESSIONS  1. Left ventricular ejection fraction, by estimation, is 30 to 35%. The left ventricle has moderately decreased function. The left ventricle demonstrates regional wall motion abnormalities (see scoring diagram/findings for description). Indeterminate diastolic filling due to E-A fusion. There is severe hypokinesis of the left ventricular, entire inferoseptal wall and inferior wall.  2. Right ventricular systolic function is severely reduced. The right ventricular size is severely enlarged. Tricuspid regurgitation signal is inadequate for assessing PA pressure.  3. Left atrial size was mildly dilated.  4. Right atrial size was mildly dilated.  5. The mitral valve is normal in structure. Trivial mitral valve regurgitation.  6. The aortic valve is tricuspid. There is mild calcification of the aortic valve. Aortic valve regurgitation is not visualized. Aortic valve sclerosis/calcification is present, without any evidence of aortic stenosis.  7. The inferior vena cava is dilated in size with <50% respiratory variability, suggesting right atrial pressure of 15 mmHg. FINDINGS  Left Ventricle: Left ventricular ejection fraction, by estimation, is 30 to 35%. The left ventricle has moderately decreased function. The left ventricle demonstrates regional wall motion abnormalities. Severe hypokinesis of the left ventricular, entire  inferoseptal wall and inferior  wall. Definity contrast agent was given IV to delineate the  left ventricular endocardial borders. The left ventricular internal cavity size was normal in size. There is no left ventricular hypertrophy. Abnormal (paradoxical) septal motion, consistent with left bundle branch block. Indeterminate diastolic filling due to E-A fusion. Right Ventricle: The right ventricular size is severely enlarged. Right vetricular wall thickness was not well visualized. Right ventricular systolic function is severely reduced. Tricuspid regurgitation signal is inadequate for assessing PA pressure. The tricuspid regurgitant velocity is 2.31 m/s, and with an assumed right atrial pressure of 15 mmHg, the estimated right ventricular systolic pressure is 69.4 mmHg. Left Atrium: Left atrial size was mildly dilated. Right Atrium: Right atrial size was mildly dilated. Pericardium: There is no evidence of pericardial effusion. Mitral Valve: The mitral valve is normal in structure. Trivial mitral valve regurgitation. Tricuspid Valve: The tricuspid valve is normal in structure. Tricuspid valve regurgitation is mild. Aortic Valve: The aortic valve is tricuspid. There is mild calcification of the aortic valve. Aortic valve regurgitation is not visualized. Aortic valve sclerosis/calcification is present, without any evidence of aortic stenosis. Pulmonic Valve: The pulmonic valve was not well visualized. Pulmonic valve regurgitation is not visualized. Aorta: The aortic root is normal in size and structure. Venous: The inferior vena cava is dilated in size with less than 50% respiratory variability, suggesting right atrial pressure of 15 mmHg. IAS/Shunts: The interatrial septum was not well visualized.  LEFT VENTRICLE PLAX 2D LVIDd:         5.10 cm   Diastology LVIDs:         4.20 cm   LV e' medial:    8.03 cm/s LV PW:         1.00 cm   LV E/e' medial:  14.1 LV IVS:        0.80 cm   LV e' lateral:   15.60 cm/s LVOT diam:     2.60 cm   LV E/e' lateral:  7.2 LV SV:         66 LV SV Index:   29 LVOT Area:     5.31 cm  RIGHT VENTRICLE            IVC RV S prime:     4.95 cm/s  IVC diam: 2.40 cm TAPSE (M-mode): 1.1 cm LEFT ATRIUM             Index        RIGHT ATRIUM           Index LA diam:        4.00 cm 1.76 cm/m   RA Area:     22.60 cm LA Vol (A2C):   45.8 ml 20.11 ml/m  RA Volume:   80.00 ml  35.13 ml/m LA Vol (A4C):   42.8 ml 18.79 ml/m LA Biplane Vol: 44.6 ml 19.58 ml/m  AORTIC VALVE LVOT Vmax:   77.50 cm/s LVOT Vmean:  59.800 cm/s LVOT VTI:    0.125 m  AORTA Ao Root diam: 3.50 cm MITRAL VALVE                TRICUSPID VALVE MV Area (PHT): 4.36 cm     TR Peak grad:   21.3 mmHg MV Decel Time: 174 msec     TR Vmax:        231.00 cm/s MV E velocity: 113.00 cm/s                             SHUNTS  Systemic VTI:  0.12 m                             Systemic Diam: 2.60 cm Sanda Klein MD Electronically signed by Sanda Klein MD Signature Date/Time: 06/19/2022/2:14:58 PM    Final    CT Angio Chest PE W and/or Wo Contrast  Result Date: 06/18/2022 CLINICAL DATA:  Pulmonary embolism (PE) suspected, high prob Shortness of breath.  Lethargy. EXAM: CT ANGIOGRAPHY CHEST WITH CONTRAST TECHNIQUE: Multidetector CT imaging of the chest was performed using the standard protocol during bolus administration of intravenous contrast. Multiplanar CT image reconstructions and MIPs were obtained to evaluate the vascular anatomy. RADIATION DOSE REDUCTION: This exam was performed according to the departmental dose-optimization program which includes automated exposure control, adjustment of the mA and/or kV according to patient size and/or use of iterative reconstruction technique. CONTRAST:  71m OMNIPAQUE IOHEXOL 350 MG/ML SOLN COMPARISON:  Radiograph earlier today. FINDINGS: Cardiovascular: Examination is diagnostic to the level of the segmental pulmonary arteries. The subsegmental branches are not well assessed. Allowing for this limitation, there  are no filling defects within the pulmonary arteries to suggest pulmonary embolus. Dilated main pulmonary artery 3.7 cm. Moderate multi chamber cardiomegaly. No pericardial effusion. There are coronary artery calcifications. Atherosclerosis of the thoracic aorta. No aneurysm. Cannot assess for dissection given phase of contrast tailored to pulmonary artery assessment. Mediastinum/Nodes: Mild multifocal mediastinal and hilar adenopathy and shotty lymph nodes. For example AP window node measures 14 mm, series 5, image 43. right hilar node measures 13 mm, series 5, image 60. Left hilar nodes measure up to 13 mm series 5, image 64. The esophagus is decompressed. No visualized thyroid nodule. No bulky axillary adenopathy. Lungs/Pleura: Elevated left hemidiaphragm with adjacent compressive atelectasis in the lingula and left lower lobe. There is also mild dependent atelectasis within both lungs. Ill-defined opacity in the medial aspect of the right lower lobe. No pleural fluid. No features of pulmonary edema. Minimal retained mucus in the trachea. Imaging obtained in expiration limiting assessment of the trachea and central bronchi. No pulmonary mass. Upper Abdomen: No acute findings. Simple left renal cyst. No follow-up imaging is needed. Musculoskeletal: Thoracic spondylosis with multilevel endplate spurring. Prominent Schmorl's node involves superior endplate of TH37 Review of the MIP images confirms the above findings. IMPRESSION: 1. No pulmonary embolus. 2. Ill-defined opacity in the medial aspect of the right lower lobe may be atelectasis or pneumonia. 3. Moderate multi chamber cardiomegaly with coronary artery calcifications. 4. Dilated main pulmonary artery suggesting pulmonary arterial hypertension. 5. Mild multifocal mediastinal and hilar adenopathy and shotty lymph nodes, likely reactive. 6. Elevated left hemidiaphragm with adjacent compressive atelectasis in the lingula and left lower lobe. Aortic  Atherosclerosis (ICD10-I70.0). Electronically Signed   By: MKeith RakeM.D.   On: 06/18/2022 16:14   DG Chest Portable 1 View  Result Date: 06/18/2022 CLINICAL DATA:  Shortness of breath. EXAM: PORTABLE CHEST 1 VIEW COMPARISON:  09/13/2020 FINDINGS: There is rotational artifact. Apparent cardiac enlargement may reflect rotational artifact. Pulmonary vascular congestion is noted. Unchanged chronic asymmetric elevation of the left hemidiaphragm with overlying scar versus atelectasis. No signs pleural effusion or frank interstitial edema. No airspace opacities. IMPRESSION: 1. Cardiac enlargement, increased from the previous exam. 2. Pulmonary vascular congestion. 3. Chronic asymmetric elevation of the left hemidiaphragm with overlying atelectasis or scar. Electronically Signed   By: TKerby MoorsM.D.   On: 06/18/2022 12:38  Subjective: No significant events overnight, he denies any complaints today  Discharge Exam: Vitals:   06/28/22 0745 06/28/22 0800  BP:  109/70  Pulse:  66  Resp:  15  Temp: 97.6 F (36.4 C)   SpO2:  92%   Vitals:   06/28/22 0400 06/28/22 0500 06/28/22 0745 06/28/22 0800  BP: 117/71   109/70  Pulse: 63   66  Resp: 20   15  Temp: 98 F (36.7 C)  97.6 F (36.4 C)   TempSrc: Oral  Oral   SpO2: 92%   92%  Weight:  91.7 kg    Height:        General: Pt is alert, awake, pleasant, communicative, Cardiovascular: Regular rate and rhythm, S1, S2 present, no rubs or gallops Respiratory: Good air entry bilaterally, improved at the bases, no wheezing Abdominal: Soft, NT, ND, bowel sounds + Extremities: No edema, clubbing or cyanosis    The results of significant diagnostics from this hospitalization (including imaging, microbiology, ancillary and laboratory) are listed below for reference.     Microbiology: Recent Results (from the past 240 hour(s))  Culture, blood (Routine X 2) w Reflex to ID Panel     Status: None   Collection Time: 06/18/22  6:27 PM    Specimen: BLOOD  Result Value Ref Range Status   Specimen Description BLOOD LEFT ANTECUBITAL  Final   Special Requests   Final    BOTTLES DRAWN AEROBIC AND ANAEROBIC Blood Culture results may not be optimal due to an inadequate volume of blood received in culture bottles   Culture   Final    NO GROWTH 5 DAYS Performed at [facility], [place] [address] [place], [place] [address]    Report Status 06/23/2022 FINAL  Final  Culture, blood (Routine X 2) w Reflex to ID Panel     Status: None   Collection Time: 06/18/22  8:11 PM   Specimen: BLOOD  Result Value Ref Range Status   Specimen Description BLOOD SITE NOT SPECIFIED  Final   Special Requests   Final    BOTTLES DRAWN AEROBIC AND ANAEROBIC Blood Culture adequate volume   Culture   Final    NO GROWTH 5 DAYS Performed at [facility], [place] [address] [place], [place] [address]    Report Status 06/23/2022 FINAL  Final  MRSA Next Gen by PCR, Nasal     Status: None   Collection Time: 06/18/22  8:53 PM   Specimen: Nasal Mucosa; Nasal Swab  Result Value Ref Range Status   MRSA by PCR Next Gen NOT DETECTED NOT DETECTED Final    Comment: (NOTE) The GeneXpert MRSA Assay (FDA approved for NASAL specimens only), is one component of a comprehensive MRSA colonization surveillance program. It is not intended to diagnose MRSA infection nor to guide or monitor treatment for MRSA infections. Test performance is not FDA approved in patients less than 40 years old. Performed at [facility], [place] [address] [place], [place] [address]      Labs: BNP (last 3 results) Recent Labs    06/21/22 0303 06/22/22 0425 06/23/22 0344  BNP 406.8* 431.3* 767.3*   Basic Metabolic Panel: Recent Labs  Lab 06/22/22 0425 06/23/22 0344 06/24/22 0444 06/25/22 0559 06/26/22 0300 06/27/22 0745 06/28/22 0321  NA 138 138 143 140 141 140 140  K 3.8 3.9 4.6 3.5 4.1 3.6 3.8  CL 97* 97* 97* 94* 94* 93* 92*  CO2 33* 35* 39* 37* 34*  38* 37*   GLUCOSE 95 106* 92 91 93 93 95  BUN '15 18 19 20 23 17 19  '$ CREATININE 1.16 1.28* 1.51* 1.27* 1.29* 1.19 1.41*  CALCIUM 8.9 8.7* 9.0 9.0 9.1 8.7* 9.0  MG 1.8 2.2 2.2  --   --   --  2.2   Liver Function Tests: Recent Labs  Lab 06/22/22 0425 06/23/22 0344 06/24/22 0444  AST 16 14* 18  ALT '16 14 14  '$ ALKPHOS 45 44 47  BILITOT 1.6* 1.1 0.8  PROT 6.1* 6.3* 6.6  ALBUMIN 3.1* 3.0* 3.2*   No results for input(s): "LIPASE", "AMYLASE" in the last 168 hours. No results for input(s): "AMMONIA" in the last 168 hours. CBC: Recent Labs  Lab 06/22/22 0425 06/23/22 0344 06/24/22 0444 06/26/22 0300  WBC 4.8 5.4 6.0 5.8  NEUTROABS 3.1 3.3 3.7  --   HGB 14.5 13.7 14.4 15.4  HCT 47.4 45.7 47.3 49.6  MCV 91.2 90.1 91.5 88.6  PLT 164 178 168 176   Cardiac Enzymes: No results for input(s): "CKTOTAL", "CKMB", "CKMBINDEX", "TROPONINI" in the last 168 hours. BNP: Invalid input(s): "POCBNP" CBG: No results for input(s): "GLUCAP" in the last 168 hours. D-Dimer No results for input(s): "DDIMER" in the last 72 hours. Hgb A1c No results for input(s): "HGBA1C" in the last 72 hours. Lipid Profile No results for input(s): "CHOL", "HDL", "LDLCALC", "TRIG", "CHOLHDL", "LDLDIRECT" in the last 72 hours. Thyroid function studies No results for input(s): "TSH", "T4TOTAL", "T3FREE", "THYROIDAB" in the last 72 hours.  Invalid input(s): "FREET3" Anemia work up No results for input(s): "VITAMINB12", "FOLATE", "FERRITIN", "TIBC", "IRON", "RETICCTPCT" in the last 72 hours. Urinalysis    Component Value Date/Time   COLORURINE YELLOW 06/21/2022 [place] 06/21/2022 1117   LABSPEC 1.016 06/21/2022 1117   PHURINE 7.0 06/21/2022 1117   GLUCOSEU >=500 (A) 06/21/2022 1117   HGBUR NEGATIVE 06/21/2022 1117   BILIRUBINUR NEGATIVE 06/21/2022 1117   KETONESUR NEGATIVE 06/21/2022 1117   PROTEINUR NEGATIVE 06/21/2022 1117   NITRITE NEGATIVE 06/21/2022 1117   LEUKOCYTESUR NEGATIVE 06/21/2022 1117    Sepsis Labs Recent Labs  Lab 06/22/22 0425 06/23/22 0344 06/24/22 0444 06/26/22 0300  WBC 4.8 5.4 6.0 5.8   Microbiology Recent Results (from the past 240 hour(s))  Culture, blood (Routine X 2) w Reflex to ID Panel     Status: None   Collection Time: 06/18/22  6:27 PM   Specimen: BLOOD  Result Value Ref Range Status   Specimen Description BLOOD LEFT ANTECUBITAL  Final   Special Requests   Final    BOTTLES DRAWN AEROBIC AND ANAEROBIC Blood Culture results may not be optimal due to an inadequate volume of blood received in culture bottles   Culture   Final    NO GROWTH 5 DAYS Performed at [facility], [place] [address] [place], [place] [address]    Report Status 06/23/2022 FINAL  Final  Culture, blood (Routine X 2) w Reflex to ID Panel     Status: None   Collection Time: 06/18/22  8:11 PM   Specimen: BLOOD  Result Value Ref Range Status   Specimen Description BLOOD SITE NOT SPECIFIED  Final   Special Requests   Final    BOTTLES DRAWN AEROBIC AND ANAEROBIC Blood Culture adequate volume   Culture   Final    NO GROWTH 5 DAYS Performed at [facility], [place] [address] [place], [place] [address]    Report Status 06/23/2022 FINAL  Final  MRSA Next Gen by PCR, Nasal     Status: None   Collection Time: 06/18/22  8:53 PM   Specimen: Nasal Mucosa; Nasal Swab  Result Value Ref Range Status   MRSA by PCR Next Gen NOT DETECTED NOT DETECTED Final    Comment: (NOTE) The GeneXpert MRSA Assay (FDA approved for NASAL specimens only), is one component of a comprehensive MRSA colonization surveillance program. It is not intended to diagnose MRSA infection nor to guide or monitor treatment for MRSA infections. Test performance is not FDA approved in patients less than 55 years old. Performed at [facility], [place] [address], [place], [place] [address]      Time coordinating discharge: Over 30 minutes  SIGNED:   Phillips Climes, MD  [facility] 06/28/2022, 11:21 AM Pager   If 7PM-7AM, please contact night-coverage [email or web address]

## 2022-06-28 NOTE — Progress Notes (Signed)
Physical Therapy Treatment Patient Details Name: Tommy Bauer MRN: [id number] DOB: 19-Jul-1941 Today's Date: 06/28/2022   History of Present Illness 81 yo male presents from [place] with shortness of breath, lethargy due to acute respiratory failure and possible sepsis secondary to RLL PNA. Pt also with HF exacerbation. PMH: hypertension, previous CVA, hyperlipidemia, dementia, central retinal occlusion.    PT Comments    Pt received in supine, agreeable to therapy session with good participation and fair tolerance for transfer training and seated LE exercises. Pt needing multimodal cues for improved body mechanics and for proper use of IS/flutter valve. Pt incontinent of bladder, RN notified and up in chair with chair alarm on at end of session, bed linens needing to be changed. Pt appearing fatigued today and needed increased assist up to [facility] for transfers with RW. Pt continues to benefit from PT services to progress toward functional mobility goals.   Recommendations for follow up therapy are one component of a multi-disciplinary discharge planning process, led by the attending physician.  Recommendations may be updated based on patient status, additional functional criteria and insurance authorization.  Follow Up Recommendations  Skilled nursing-short term rehab (<3 hours/day) Can patient physically be transported by private vehicle: Yes   Assistance Recommended at Discharge Frequent or constant Supervision/Assistance  Patient can return home with the following A little help with walking and/or transfers;A little help with bathing/dressing/bathroom   Equipment Recommendations  None recommended by PT    Recommendations for Other Services       Precautions / Restrictions Precautions Precautions: Fall Precaution Comments: monitor O2, pt incontinent Restrictions Weight Bearing Restrictions: No     Mobility  Bed Mobility Overal bed mobility: Needs Assistance Bed  Mobility: Rolling, Sidelying to Sit Rolling: Min assist Sidelying to sit: Mod assist, HOB elevated       General bed mobility comments: Assist for scooting to EOB, trunk elevation, increased time and multiple stop/starts requiring mod encouragement to continue.    Transfers Overall transfer level: Needs assistance Equipment used: Rolling [place] (2 wheels) Transfers: Sit to/from Stand Sit to Stand: Mod assist   Step pivot transfers: Mod assist       General transfer comment: assist for power up, rise, steadying, and pivotal steps to reach recliner. Pt quick to fatigue and deferred longer distance; modA due to posterior lean and steadying assist needed also for RW positioning assist    Ambulation/Gait               General Gait Details: too fatigued to progress past chair today ~25f only     Balance Overall balance assessment: Needs assistance   Sitting balance-Leahy Scale: Fair     Standing balance support: Bilateral upper extremity supported, During functional activity, Reliant on assistive device for balance Standing balance-Leahy Scale: Poor Standing balance comment: posterior LOB needing consistent min to modA for stability with RW                            Cognition Arousal/Alertness: Awake/alert Behavior During Therapy: WFL for tasks assessed/performed Overall Cognitive Status: History of cognitive impairments - at baseline                                 General Comments: history of dementia, today with slower processing and needing increased multimodal cues. Pt lying in wet bed (purewick line had been detached) and seemingly unconcerned;  Pt needed reinforcement for proper use of call bell.        Exercises General Exercises - Lower Extremity Long Arc Quad: AROM, Both, Seated, 20 reps (10 reps x 2 sets) Hip Flexion/Marching: AROM, Both, Seated, 20 reps Other Exercises Other Exercises: IS and flutter valve x5 reps ea device, pt  needs teachback for technique    General Comments General comments (skin integrity, edema, etc.): BP 109/69 prior to standing and HR/SpO2 WFL on 4L O2 [place]      Pertinent Vitals/Pain Pain Assessment Pain Assessment: Faces Faces Pain Scale: Hurts little more Pain Location: groin area during bed mobility Pain Descriptors / Indicators: Grimacing, Guarding, Discomfort Pain Intervention(s): Limited activity within patient's tolerance, Monitored during session, Repositioned, Other (comment) (RN notified pt may have more skin breakdown to assess as he is continuing to c/o pain around area of male purewick)           PT Goals (current goals can now be found in the care plan section) Acute Rehab PT Goals Patient Stated Goal: none stated PT Goal Formulation: With patient Time For Goal Achievement: 07/04/22 Progress towards PT goals: Progressing toward goals    Frequency    Min 2X/week      PT Plan Current plan remains appropriate       AM-PAC PT "6 Clicks" Mobility   Outcome Measure  Help needed turning from your back to your side while in a flat bed without using bedrails?: A Little Help needed moving from lying on your back to sitting on the side of a flat bed without using bedrails?: A Lot Help needed moving to and from a bed to a chair (including a wheelchair)?: A Lot Help needed standing up from a chair using your arms (e.g., wheelchair or bedside chair)?: A Lot Help needed to walk in hospital room?: Total Help needed climbing 3-5 steps with a railing? : Total 6 Click Score: 11    End of Session Equipment Utilized During Treatment: Oxygen Activity Tolerance: Patient tolerated treatment well;Patient limited by fatigue Patient left: with call bell/phone within reach;in chair;with chair alarm set;with nursing/sitter in room Nurse Communication: Mobility status PT Visit Diagnosis: Unsteadiness on feet (R26.81);Muscle weakness (generalized) (M62.81)     Time: 0350-0938 PT  Time Calculation (min) (ACUTE ONLY): 29 min  Charges:  $Therapeutic Exercise: 8-22 mins $Therapeutic Activity: 8-22 mins                     Natallie Ravenscroft P., PTA Acute Rehabilitation Services Secure Chat Preferred 9a-5:30pm Office: [place] 06/28/2022, 4:52 PM

## 2022-06-28 NOTE — Progress Notes (Signed)
Order to discharge pt to [facility].  Discharge instructions/AVS placed in discharge packet.  Report called to [place], RN.  All questions answered.

## 2022-06-28 NOTE — TOC Progression Note (Signed)
Transition of Care [facility]) - Progression Note    Patient Details  Name: Trellis Vanoverbeke MRN: [id number] Date of Birth: 04-Jun-1941  Transition of Care [facility]) CM/SW [place], [place] Work Phone Number: 06/28/2022, 12:04 PM  Clinical Narrative:    MSW intern spoke with patient's wife, Lelon Frohlich to let her know patient will be discharging back to [facility] care this evening by PTAR.    Expected Discharge Plan: [place] Barriers to Discharge: Continued Medical Work up  Expected Discharge Plan and Services Expected Discharge Plan: [place] Choice: [place] arrangements for the past 2 months: [place] Expected Discharge Date: 06/28/22                                     Social Determinants of Health (SDOH) Interventions    Readmission Risk Interventions     No data to display

## 2022-07-27 IMAGING — CT CT HEAD WO/W CM
4 of 10 series · 15 of 47 positions shown, 17 images · IV contrast (APPLIED)
Comparison: Prior head CT from 01/18/2021.

CLINICAL DATA: Initial evaluation for cellulitis of right scalp.

EXAM:
CT HEAD WITHOUT AND WITH CONTRAST
TECHNIQUE: Contiguous axial images were obtained from the base of the skull
through the vertex without and with intravenous contrast
CONTRAST:  100mL OMNIPAQUE IOHEXOL 350 MG/ML SOLN

[Series 3: head wo 5.0 h30s · axial · 0.44mm/px · z∈[-205,-110]mm · 5 of 29 slices shown, 7 images]
[im 5/29  brain]
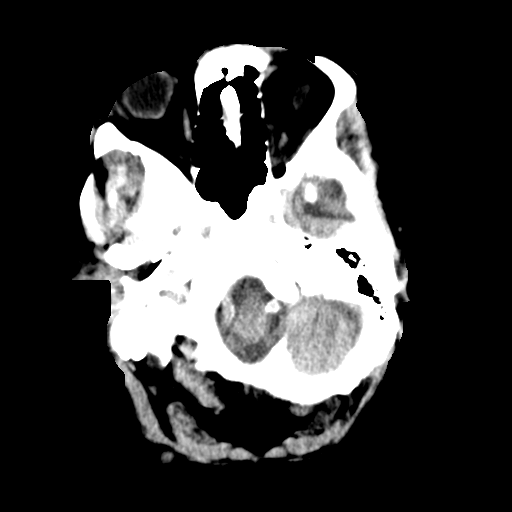
[im 5/29  bone]
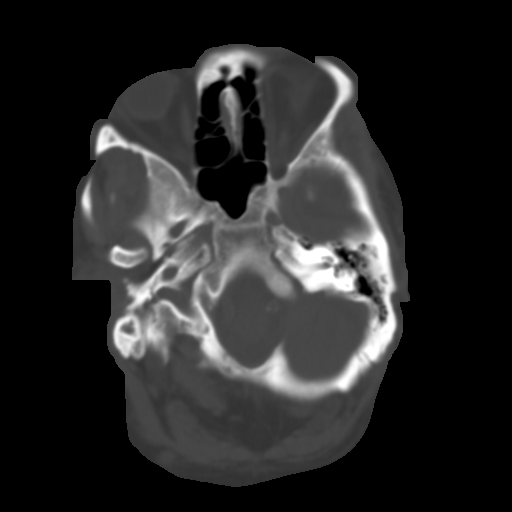
[im 10/29  brain]
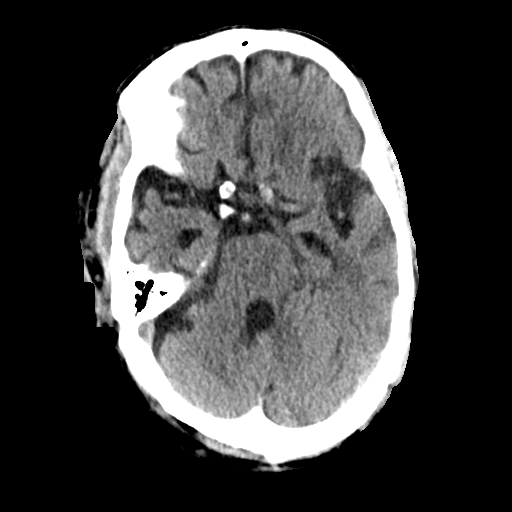
[im 15/29  brain]
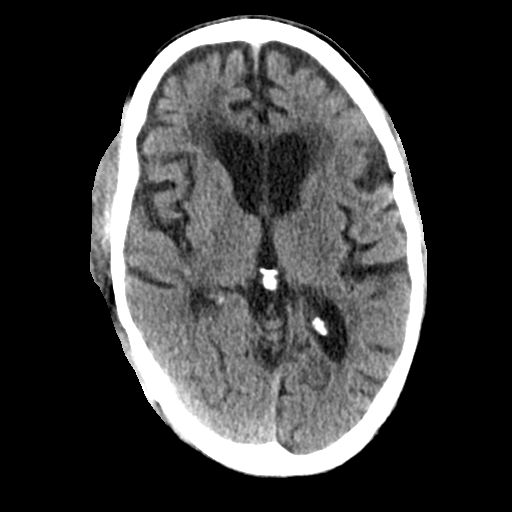
[im 19/29  brain]
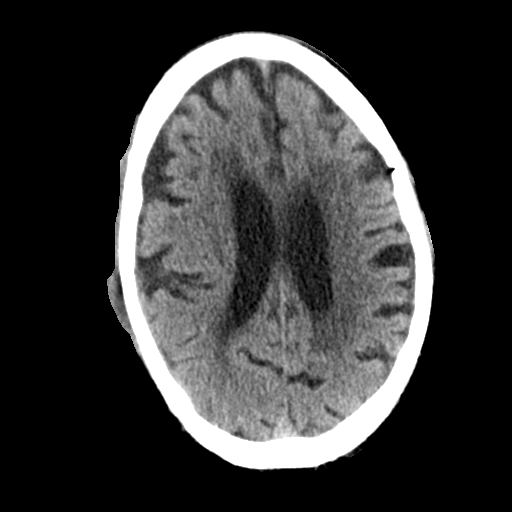
[im 24/29  brain]
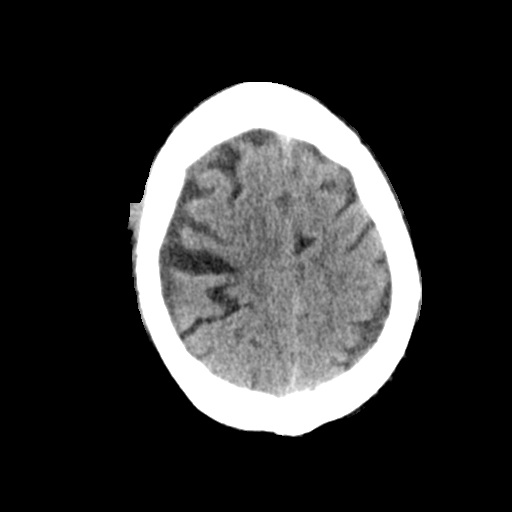
[im 24/29  bone]
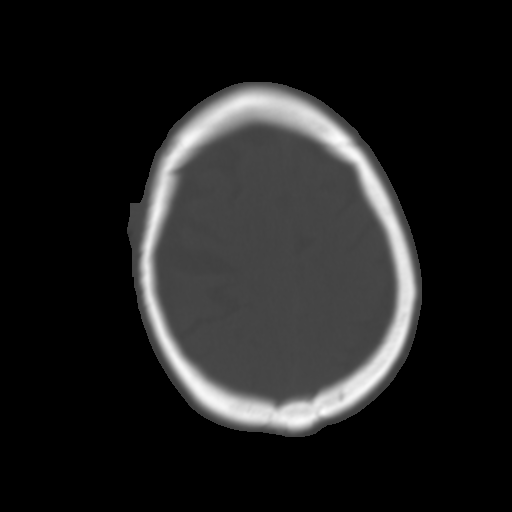

[Series 5: head wo 5.0 mpr ax · axial · 0.44mm/px · z∈[-223,-125]mm · 5 of 35 slices shown]
[im 5/35  brain]
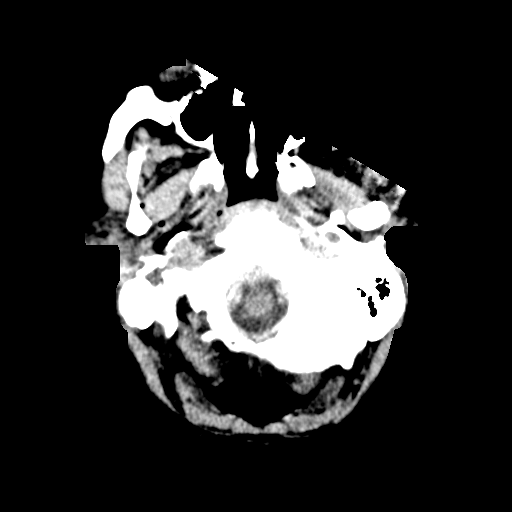
[im 10/35  brain]
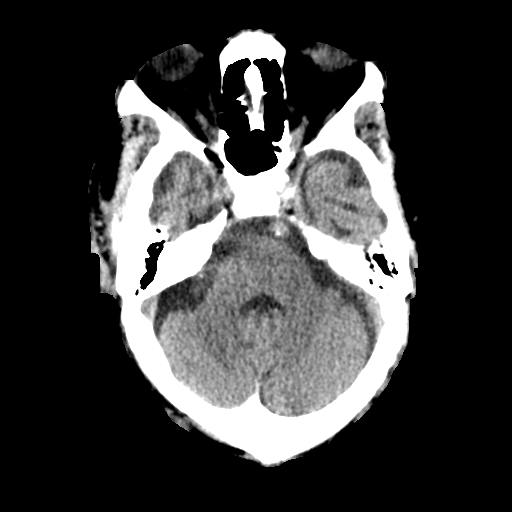
[im 15/35  brain]
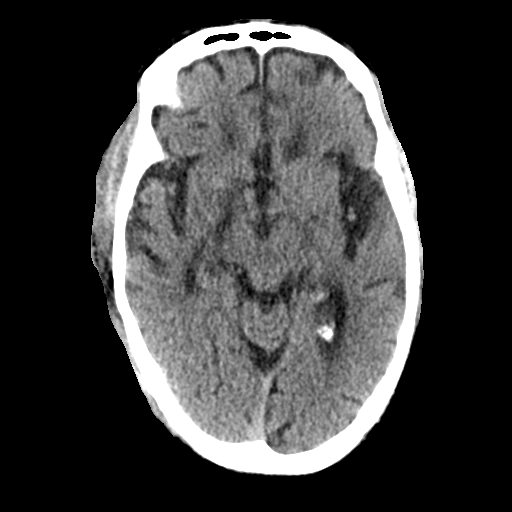
[im 20/35  brain]
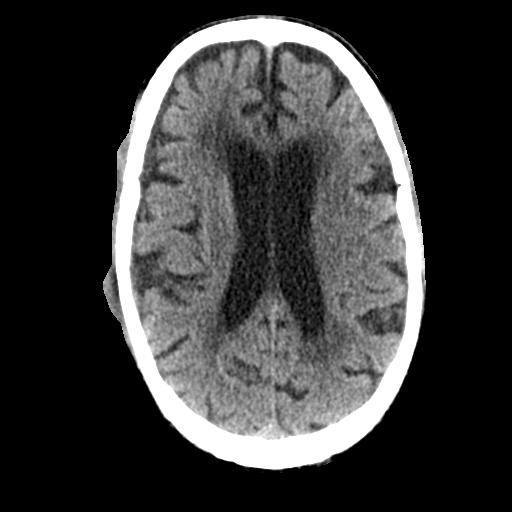
[im 25/35  brain]
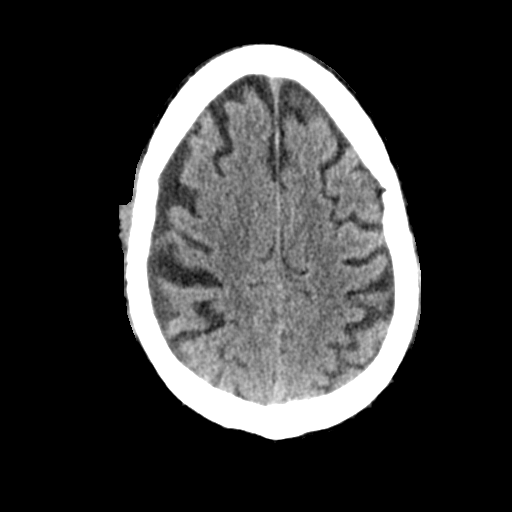

[Series 7: head wo 5.0 mpr sag · sagittal · 0.44mm/px · 3 of 55 slices shown]
[im 14/55  brain]
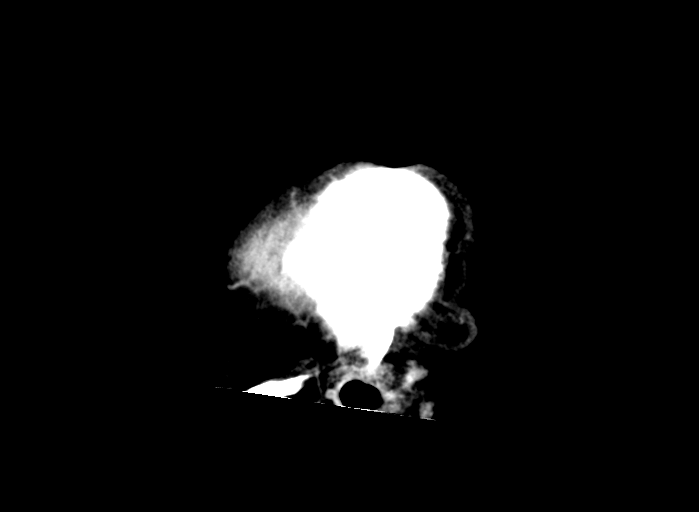
[im 26/55  brain]
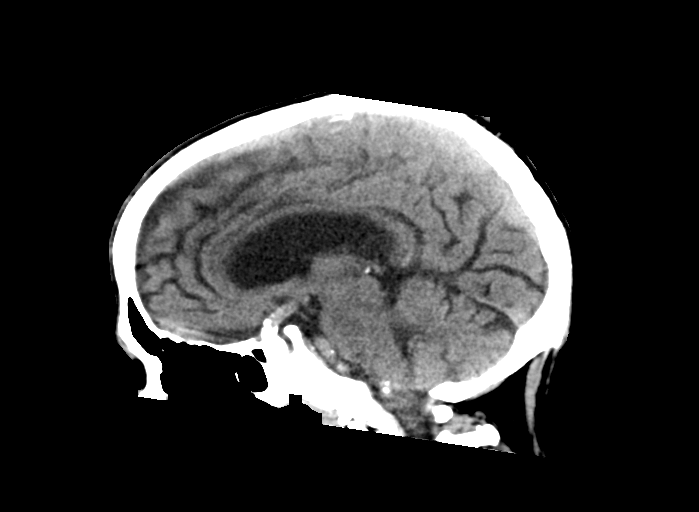
[im 39/55  brain]
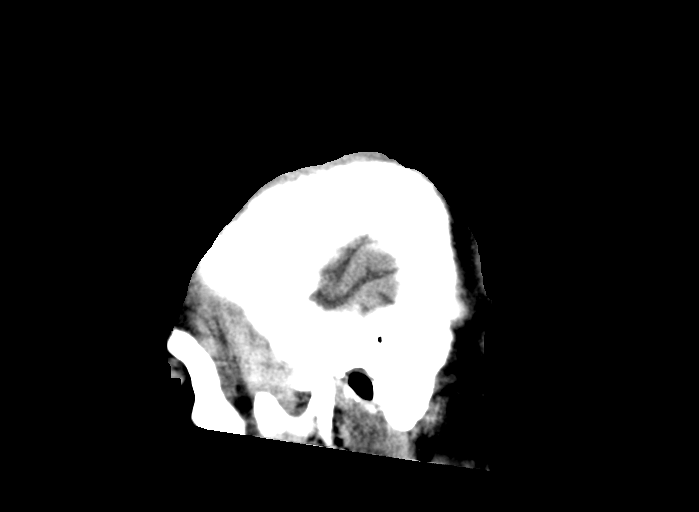

[Series 9: head with 3.0 mpr cor · coronal · 0.42mm/px · 2 of 82 slices shown]
[im 28/82  brain]
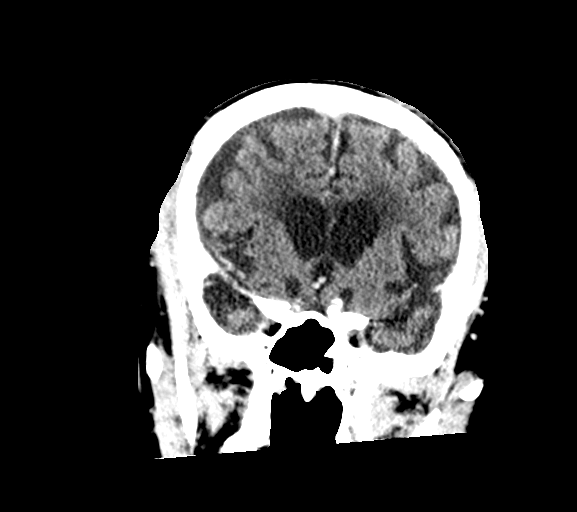
[im 55/82  brain]
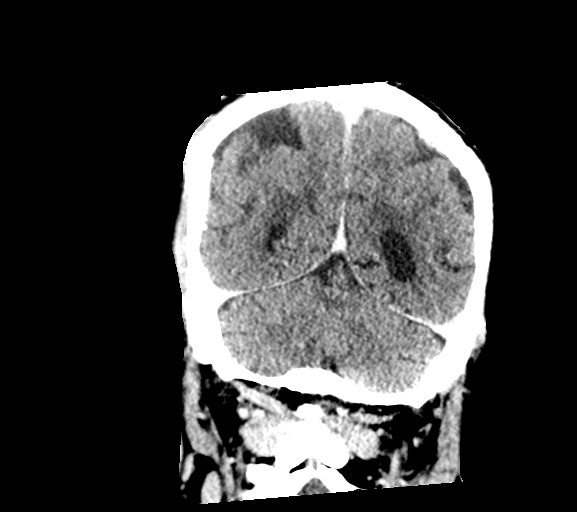

[15 of 47 positions shown; findings below may reference images not displayed]

FINDINGS: Brain: Generalized age-related cerebral atrophy with moderate
chronic microvascular ischemic disease, stable. No acute
intracranial hemorrhage. No acute large vessel territory infarct. No
mass lesion, midline shift or mass effect. Mild ventricular
prominence related to global parenchymal volume loss without
hydrocephalus. No extra-axial fluid collection.

Vascular: No hyperdense vessel prior to contrast administration.
Calcified atherosclerosis present at skull base. Following contrast
administration, normal intravascular enhancement seen throughout the
intracranial circulation. Major dural sinuses appear grossly patent.

Skull: Again seen is diffuse soft tissue thickening and irregularity
involving the right frontoparietal scalp, concerning for
infection/cellulitis. Since previous exam, the superficial aspect of
the area involved appears somewhat more eroded as compared to
previous. Suspected fluid collection measuring approximately 3.7 x
0.9 x 4.1 cm seen at the right frontoparietal scalp, suspicious for
abscess (series 9, image 36). This abuts the underlying calvarium.
The outer table of the calvarium again seen to be somewhat irregular
with permeated appearance, suggesting possible associated
osteomyelitis. Again, no evidence for intracranial spread of
infection.

Sinuses/Orbits: Globes and orbital soft tissues within normal
limits. Visualized paranasal sinuses are largely clear. Moderate
right mastoid effusion, similar to previous.

Other: None.
IMPRESSION: 1. Persistent diffuse soft tissue thickening and irregularity
involving the right frontoparietal scalp, concerning for
infection/cellulitis. Suspected 3-4 cm fluid collection within this
region, suspicious for abscess. Irregularity of the underlying outer
table of the calvarium suspicious for associated osseous
involvement/osteomyelitis. No evidence for intracranial involvement.
2. No other acute intracranial abnormality.
3. Stable atrophy with moderate chronic microvascular ischemic
disease.
4. Moderate right mastoid effusion, similar to previous.

## 2023-02-26 NOTE — Progress Notes (Unsigned)
Office Visit    Patient Name: Tommy Bauer Date of Encounter: 02/27/2023  PCP:  Eloisa Northern, MD   [facility]  Cardiologist:  Christell Constant, MD  Advanced Practice Provider:  No care team member to display Electrophysiologist:  None    HPI    Tommy Bauer is a 82 y.o. male with a past medical history of hypertension, hyperlipidemia, CVA, central retinal artery occlusion, prostate cancer status postradiation, progressive dementia and tobacco abuse (prior) presents today for hospital follow-up.  Patient was found to be hypoxic by faculty staff.  Normally he was not on oxygen.  His ex-wife who comes to visit him regularly notes that his memory has been on the decline, but he is able to recognize her and their 3 children.  It she states that he typically does not remember his age and currently does not know why he is at the hospital.  He sustained a wound on his right side of his head after being hospitalized for abscess and cellulitis of the scalp last year.  Ever since that time he has been at [facility].  Workup consistent with pneumonia and CHF.  Was seen by cardiology and admitted.  Diagnosed with acute hypoxic respiratory failure secondary to pneumonia and acute on chronic combined CHF.  He had fevers, leukocytosis, elevated procalcitonin along with CT evidence of possible right lower lobe pneumonia.  His workup was also consistent with fluid over load and CHF decompensation.  Treated with IV antibiotics seen by SLP.  Last known LVEF was 55% and EF dropped to 35% with wall motion abnormalities.  He received IV diuresis and was discharged on Jardiance, Entresto, and Aldactone.  Home dose of Lasix was 40 mg oral daily.  Started on carvedilol but discontinued due to bradycardia.  Today, he tells me that he has no chest pain or shortness of breath.  No swelling in his legs.  He has had swelling in the past.  No lightheadedness or dizziness.  Patient does  suffer from dementia.  He uses his Gauger when he gets around the facility.  He does do physical therapy every couple of months but does not like to socialize very much.  He does not feel any fluttering in his chest or any skipped beats.  EKG was significant for frequent PVCs which is probably why the pulse oximeter at his facility was reading sinus bradycardia.  EKG was reviewed with DOD.   Reports no shortness of breath nor dyspnea on exertion. Reports no chest pain, pressure, or tightness. No edema, orthopnea, PND. Reports no palpitations.   Past Medical History    Past Medical History:  Diagnosis Date   ADENOCARCINOMA, PROSTATE 05/19/2010   BACK PAIN 07/16/2008   Carpal tunnel syndrome 10/15/2008   COLONIC POLYPS, HX OF 06/08/2007   Contusion    left shoulder,left knee,s/p recent fall end of march 2013   CVA (cerebral vascular accident) [facility])    ERECTILE DYSFUNCTION, ORGANIC 07/11/2007   Headache(784.0) 07/11/2007   HYPERTENSION 06/08/2007   INGUINAL HERNIA 12/04/2007   LEFT VENTRICULAR FUNCTION, DECREASED 11/12/2007   Prostate cancer (HCC) 09/14/2009   prostateectomy,adenocarcinoma,gleason=4+3=7   PROSTATE SPECIFIC ANTIGEN, ELEVATED 05/06/2009   TOBACCO ABUSE 11/06/2009   Past Surgical History:  Procedure Laterality Date   HERNIA REPAIR     right ingunial   PROSTATE SURGERY  09/14/2009   radical prostatectomy /gleason=4+3-7    Allergies  No Known Allergies   EKGs/Labs/Other Studies Reviewed:   The following  studies were reviewed today: Cardiac Studies & Procedures       ECHOCARDIOGRAM  ECHOCARDIOGRAM COMPLETE 06/19/2022  Narrative ECHOCARDIOGRAM REPORT    Patient Name:   Tommy Bauer Date of Exam: 06/19/2022 Medical Rec #:  [id number]   Height:       73.0 in Accession #:    [id number]  Weight:       228.6 lb Date of Birth:  02/24/1941   BSA:          2.277 m Patient Age:    81 years    BP:           129/75 mmHg Patient Gender: M           HR:           79 bpm. Exam  Location:  Inpatient  Procedure: 2D Echo, Cardiac Doppler, Color Doppler and Intracardiac Opacification Agent  Indications:    Cardiomegaly  History:        Patient has prior history of Echocardiogram examinations, most recent 09/14/2020. Stroke; Risk Factors:Former Smoker and Hypertension. Cancer.  Sonographer:    Milda Smart Referring Phys: [id number] RONDELL A SMITH   Sonographer Comments: Technically difficult study due to poor echo windows. Image acquisition challenging due to patient body habitus. IMPRESSIONS   1. Left ventricular ejection fraction, by estimation, is 30 to 35%. The left ventricle has moderately decreased function. The left ventricle demonstrates regional wall motion abnormalities (see scoring diagram/findings for description). Indeterminate diastolic filling due to E-A fusion. There is severe hypokinesis of the left ventricular, entire inferoseptal wall and inferior wall. 2. Right ventricular systolic function is severely reduced. The right ventricular size is severely enlarged. Tricuspid regurgitation signal is inadequate for assessing PA pressure. 3. Left atrial size was mildly dilated. 4. Right atrial size was mildly dilated. 5. The mitral valve is normal in structure. Trivial mitral valve regurgitation. 6. The aortic valve is tricuspid. There is mild calcification of the aortic valve. Aortic valve regurgitation is not visualized. Aortic valve sclerosis/calcification is present, without any evidence of aortic stenosis. 7. The inferior vena cava is dilated in size with <50% respiratory variability, suggesting right atrial pressure of 15 mmHg.  FINDINGS Left Ventricle: Left ventricular ejection fraction, by estimation, is 30 to 35%. The left ventricle has moderately decreased function. The left ventricle demonstrates regional wall motion abnormalities. Severe hypokinesis of the left ventricular, entire inferoseptal wall and inferior wall. Definity contrast agent  was given IV to delineate the left ventricular endocardial borders. The left ventricular internal cavity size was normal in size. There is no left ventricular hypertrophy. Abnormal (paradoxical) septal motion, consistent with left bundle branch block. Indeterminate diastolic filling due to E-A fusion.  Right Ventricle: The right ventricular size is severely enlarged. Right vetricular wall thickness was not well visualized. Right ventricular systolic function is severely reduced. Tricuspid regurgitation signal is inadequate for assessing PA pressure. The tricuspid regurgitant velocity is 2.31 m/s, and with an assumed right atrial pressure of 15 mmHg, the estimated right ventricular systolic pressure is 36.3 mmHg.  Left Atrium: Left atrial size was mildly dilated.  Right Atrium: Right atrial size was mildly dilated.  Pericardium: There is no evidence of pericardial effusion.  Mitral Valve: The mitral valve is normal in structure. Trivial mitral valve regurgitation.  Tricuspid Valve: The tricuspid valve is normal in structure. Tricuspid valve regurgitation is mild.  Aortic Valve: The aortic valve is tricuspid. There is mild calcification of the aortic valve. Aortic valve regurgitation is not visualized.  Aortic valve sclerosis/calcification is present, without any evidence of aortic stenosis.  Pulmonic Valve: The pulmonic valve was not well visualized. Pulmonic valve regurgitation is not visualized.  Aorta: The aortic root is normal in size and structure.  Venous: The inferior vena cava is dilated in size with less than 50% respiratory variability, suggesting right atrial pressure of 15 mmHg.  IAS/Shunts: The interatrial septum was not well visualized.   LEFT VENTRICLE PLAX 2D LVIDd:         5.10 cm   Diastology LVIDs:         4.20 cm   LV e' medial:    8.03 cm/s LV PW:         1.00 cm   LV E/e' medial:  14.1 LV IVS:        0.80 cm   LV e' lateral:   15.60 cm/s LVOT diam:     2.60 cm    LV E/e' lateral: 7.2 LV SV:         66 LV SV Index:   29 LVOT Area:     5.31 cm   RIGHT VENTRICLE            IVC RV S prime:     4.95 cm/s  IVC diam: 2.40 cm TAPSE (M-mode): 1.1 cm  LEFT ATRIUM             Index        RIGHT ATRIUM           Index LA diam:        4.00 cm 1.76 cm/m   RA Area:     22.60 cm LA Vol (A2C):   45.8 ml 20.11 ml/m  RA Volume:   80.00 ml  35.13 ml/m LA Vol (A4C):   42.8 ml 18.79 ml/m LA Biplane Vol: 44.6 ml 19.58 ml/m AORTIC VALVE LVOT Vmax:   77.50 cm/s LVOT Vmean:  59.800 cm/s LVOT VTI:    0.125 m  AORTA Ao Root diam: 3.50 cm  MITRAL VALVE                TRICUSPID VALVE MV Area (PHT): 4.36 cm     TR Peak grad:   21.3 mmHg MV Decel Time: 174 msec     TR Vmax:        231.00 cm/s MV E velocity: 113.00 cm/s SHUNTS Systemic VTI:  0.12 m Systemic Diam: 2.60 cm  Rachelle Hora Croitoru MD Electronically signed by Thurmon Fair MD Signature Date/Time: 06/19/2022/2:14:58 PM    Final              EKG:  EKG is  ordered today.  The ekg ordered today demonstrates normal sinus rhythm with frequent PVCs, rate 80 bpm  Recent Labs: 06/23/2022: B Natriuretic Peptide 602.8 06/24/2022: ALT 14 06/26/2022: Hemoglobin 15.4; Platelets 176 06/28/2022: BUN 19; Creatinine, Ser 1.41; Magnesium 2.2; Potassium 3.8; Sodium 140  Recent Lipid Panel    Component Value Date/Time   CHOL 102 06/19/2022 0925   TRIG 22 06/19/2022 0925   HDL 36 (L) 06/19/2022 0925   CHOLHDL 2.8 06/19/2022 0925   VLDL 4 06/19/2022 0925   LDLCALC 62 06/19/2022 0925    Home Medications   Current Meds  Medication Sig   acetaminophen (TYLENOL) 500 MG tablet Take 500 mg by mouth every 12 (twelve) hours.   albuterol (PROVENTIL) (2.5 MG/3ML) 0.083% nebulizer solution Take 3 mLs (2.5 mg total) by nebulization every 2 (two) hours as needed for wheezing or shortness of  breath.   aspirin 81 MG tablet Take 1 tablet (81 mg total) by mouth daily.   atorvastatin (LIPITOR) 40 MG tablet Take 1 tablet (40  mg total) by mouth daily. (Patient taking differently: Take 40 mg by mouth every evening.)   empagliflozin (JARDIANCE) 10 MG TABS tablet Take 1 tablet (10 mg total) by mouth daily.   folic acid (FOLVITE) 1 MG tablet Take 1 mg by mouth daily.   furosemide (LASIX) 40 MG tablet Take 40 mg by mouth daily.   gabapentin (NEURONTIN) 100 MG capsule Take 100 mg by mouth 3 (three) times daily.   latanoprost (XALATAN) 0.005 % ophthalmic solution Place 1 drop into both eyes at bedtime.   pantoprazole (PROTONIX) 40 MG tablet Take 1 tablet (40 mg total) by mouth daily.   potassium chloride SA (KLOR-CON M) 20 MEQ tablet Take 1 tablet (20 mEq total) by mouth 2 (two) times daily.   sacubitril-valsartan (ENTRESTO) 24-26 MG Take 1 tablet by mouth 2 (two) times daily.   sertraline (ZOLOFT) 50 MG tablet Take 50 mg by mouth daily.   Simethicone 125 MG CAPS Take 125 mg by mouth in the morning, at noon, and at bedtime.   spironolactone (ALDACTONE) 25 MG tablet Take 0.5 tablets (12.5 mg total) by mouth daily.   thiamine 100 MG tablet Take 100 mg by mouth daily.     Review of Systems      All other systems reviewed and are otherwise negative except as noted above.  Physical Exam    VS:  BP 138/68   Pulse 80   Ht 6\' 1"  (1.854 m)   Wt 170 lb (77.1 kg)   SpO2 98%   BMI 22.43 kg/m  , BMI Body mass index is 22.43 kg/m.  Wt Readings from Last 3 Encounters:  02/27/23 170 lb (77.1 kg)  06/28/22 202 lb 2.6 oz (91.7 kg)  09/20/21 220 lb (99.8 kg)     GEN: Well nourished, well developed, in no acute distress. HEENT: normal. Neck: Supple, no JVD, carotid bruits, or masses. Cardiac: RRR with frequent skipped beats, no murmurs, rubs, or gallops. No clubbing, cyanosis, edema.  Radials/PT 2+ and equal bilaterally.  Respiratory:  Respirations regular and unlabored, clear to auscultation bilaterally. GI: Soft, nontender, nondistended. MS: No deformity or atrophy. Skin: Warm and dry, no rash. Neuro:  Strength and  sensation are intact. Psych: Normal affect.  Assessment & Plan    Chronic combined systolic and diastolic CHF -Euvolemic on exam today -Continue aspirin milligrams daily, atorvastatin 40 mg daily, Jardiance 10 mg daily, Lasix 40 mg daily, potassium 20 mEq twice a day, Entresto 24-26 mg twice a day, spironolactone 12.5 mg daily  Bradycardia with intermittent type II AV block -Frequent PVCs on EKG today -Pulse oximeter of 35 at facility likely inaccurate due to this -Will plan to order echocardiogram and a 3-day ZIO monitor to evaluate for PVC burden -We have also planned for a BMP and magnesium checked today  Non-ACS pattern elevation of troponin entheses demand mismatch cause from hypoxia) -Present on last admission 05/2022 -Will check an echocardiogram to update EF now that he has been on GDMT  Gradual progressive dementia -Family member present today during our interview  Hyperlipidemia -Last LDL was 62 (06/19/2022) -Will be due for updated lipid panel this summer  History of stroke -Stable, receives physical therapy at the facility  History of prostate cancer -Did not address today  AKI on CKD stage III A -Most recent creatinine was 1.4 (06/28/2022) -  We have ordered a BMP and magnesium level today and will review      Disposition: Follow up 6 months with Christell Constant, MD or APP.  Signed, Sharlene Dory, PA-C 02/27/2023, 1:02 PM [facility]

## 2023-02-27 ENCOUNTER — Encounter: Payer: Self-pay | Admitting: *Deleted

## 2023-02-27 ENCOUNTER — Ambulatory Visit: Payer: Medicare Other | Attending: Physician Assistant | Admitting: Physician Assistant

## 2023-02-27 ENCOUNTER — Encounter: Payer: Self-pay | Admitting: Physician Assistant

## 2023-02-27 ENCOUNTER — Ambulatory Visit (INDEPENDENT_AMBULATORY_CARE_PROVIDER_SITE_OTHER): Payer: Medicare Other

## 2023-02-27 VITALS — BP 138/68 | HR 80 | Ht 73.0 in | Wt 170.0 lb

## 2023-02-27 DIAGNOSIS — I5033 Acute on chronic diastolic (congestive) heart failure: Secondary | ICD-10-CM | POA: Diagnosis present

## 2023-02-27 DIAGNOSIS — N189 Chronic kidney disease, unspecified: Secondary | ICD-10-CM | POA: Diagnosis present

## 2023-02-27 DIAGNOSIS — I440 Atrioventricular block, first degree: Secondary | ICD-10-CM | POA: Diagnosis present

## 2023-02-27 DIAGNOSIS — F0390 Unspecified dementia without behavioral disturbance: Secondary | ICD-10-CM | POA: Diagnosis present

## 2023-02-27 DIAGNOSIS — I639 Cerebral infarction, unspecified: Secondary | ICD-10-CM | POA: Diagnosis present

## 2023-02-27 DIAGNOSIS — E785 Hyperlipidemia, unspecified: Secondary | ICD-10-CM | POA: Diagnosis present

## 2023-02-27 DIAGNOSIS — I493 Ventricular premature depolarization: Secondary | ICD-10-CM | POA: Diagnosis present

## 2023-02-27 DIAGNOSIS — R001 Bradycardia, unspecified: Secondary | ICD-10-CM

## 2023-02-27 DIAGNOSIS — J9601 Acute respiratory failure with hypoxia: Secondary | ICD-10-CM

## 2023-02-27 DIAGNOSIS — C61 Malignant neoplasm of prostate: Secondary | ICD-10-CM

## 2023-02-27 NOTE — Progress Notes (Unsigned)
ZIO XT [id number] from office inventory applied to patient. Dr. Izora Ribas to read.

## 2023-02-27 NOTE — Patient Instructions (Addendum)
Medication Instructions:    *If you need a refill on your cardiac medications before your next appointment, please call your pharmacy*   Lab Work: BMET MAG TODAY    If you have labs (blood work) drawn today and your tests are completely normal, you will receive your results only by: MyChart Message (if you have MyChart) OR A paper copy in the mail If you have any lab test that is abnormal or we need to change your treatment, we will call you to review the results.   Testing/Procedures: Your physician has requested that you have an echocardiogram. Echocardiography is a painless test that uses sound waves to create images of your heart. It provides your doctor with information about the size and shape of your heart and how well your heart's chambers and valves are working. This procedure takes approximately one hour. There are no restrictions for this procedure. Please do NOT wear cologne, perfume, aftershave, or lotions (deodorant is allowed). Please arrive 15 minutes prior to your appointment time.   Your physician has recommended that you wear an event monitor. Event monitors are medical devices that record the heart's electrical activity. Doctors most often [place] these monitors to diagnose arrhythmias. Arrhythmias are problems with the speed or rhythm of the heartbeat. The monitor is a small, portable device. You can wear one while you do your normal daily activities. This is usually used to diagnose what is causing palpitations/syncope (passing out).    Follow-Up: At [facility], you and your health needs are our priority.  As part of our continuing mission to provide you with exceptional heart care, we have created designated Provider Care Teams.  These Care Teams include your primary Cardiologist (physician) and Advanced Practice Providers (APPs -  Physician Assistants and Nurse Practitioners) who all work together to provide you with the care you need, when you need it.  We  recommend signing up for the patient portal called "MyChart".  Sign up information is provided on this After Visit Summary.  MyChart is used to connect with patients for Virtual Visits (Telemedicine).  Patients are able to view lab/test results, encounter notes, upcoming appointments, etc.  Non-urgent messages can be sent to your provider as well.   To learn more about what you can do with MyChart, go to [email or web address].    Your next appointment:    6 month(s)  Provider:   Christell Constant, MD     Other Instructions   Christena Deem- Long Term Monitor Instructions  Your physician has requested you wear a ZIO patch monitor for 14 days.  This is a single patch monitor. [employer] supplies one patch monitor per enrollment. Additional stickers are not available. Please do not apply patch if you will be having a Nuclear Stress Test,  Echocardiogram, Cardiac CT, MRI, or Chest Xray during the period you would be wearing the  monitor. The patch cannot be worn during these tests. You cannot remove and re-apply the  ZIO XT patch monitor.  Your ZIO patch monitor will be mailed 3 day USPS to your address on file. It may take 3-5 days  to receive your monitor after you have been enrolled.  Once you have received your monitor, please review the enclosed instructions. Your monitor  has already been registered assigning a specific monitor serial # to you.  Billing and Patient Assistance Program Information  We have supplied [employer] with any of your insurance information on file for billing purposes. [employer] offers a sliding scale Patient  Assistance Program for patients that do not have  insurance, or whose insurance does not completely cover the cost of the ZIO monitor.  You must apply for the Patient Assistance Program to qualify for this discounted rate.  To apply, please call [employer] at [phone], select option 4, select option 2, ask to apply for  Patient Assistance Program. Meredeth Ide will  ask your household income, and how many people  are in your household. They will quote your out-of-pocket cost based on that information.  [employer] will also be able to set up a 20-month, interest-free payment plan if needed.  Applying the monitor   Shave hair from upper left chest.  Hold abrader disc by orange tab. Rub abrader in 40 strokes over the upper left chest as  indicated in your monitor instructions.  Clean area with 4 enclosed alcohol pads. Let dry.  Apply patch as indicated in monitor instructions. Patch will be placed under collarbone on left  side of chest with arrow pointing upward.  Rub patch adhesive wings for 2 minutes. Remove white label marked "1". Remove the white  label marked "2". Rub patch adhesive wings for 2 additional minutes.  While looking in a mirror, press and release button in center of patch. A small green light will  flash 3-4 times. This will be your only indicator that the monitor has been turned on.  Do not shower for the first 24 hours. You may shower after the first 24 hours.  Press the button if you feel a symptom. You will hear a small click. Record Date, Time and  Symptom in the Patient Logbook.  When you are ready to remove the patch, follow instructions on the last 2 pages of Patient  Logbook. Stick patch monitor onto the last page of Patient Logbook.  Place Patient Logbook in the blue and white box. Use locking tab on box and tape box closed  securely. The blue and white box has prepaid postage on it. Please place it in the mailbox as  soon as possible. Your physician should have your test results approximately 7 days after the  monitor has been mailed back to [facility].  Call [facility] Customer Care at [phone] if you have questions regarding  your ZIO XT patch monitor. Call them immediately if you see an orange light blinking on your  monitor.  If your monitor falls off in less than 4 days, contact our Monitor department at  [phone].  If your monitor becomes loose or falls off after 4 days call [employer] at [phone] for  suggestions on securing your monitor   ZIO XT- Long Term Monitor Instructions  Your physician has requested you wear a ZIO patch monitor for 3 days.  This is a single patch monitor. [employer] supplies one patch monitor per enrollment. Additional stickers are not available. Please do not apply patch if you will be having a Nuclear Stress Test,  Echocardiogram, Cardiac CT, MRI, or Chest Xray during the period you would be wearing the  monitor. The patch cannot be worn during these tests. You cannot remove and re-apply the  ZIO XT patch monitor.  Your ZIO patch monitor will be mailed 3 day USPS to your address on file. It may take 3-5 days  to receive your monitor after you have been enrolled.  Once you have received your monitor, please review the enclosed instructions. Your monitor  has already been registered assigning a specific monitor serial # to you.  Billing and Patient Assistance Program  Information  We have supplied [employer] with any of your insurance information on file for billing purposes. [employer] offers a sliding scale Patient Assistance Program for patients that do not have  insurance, or whose insurance does not completely cover the cost of the ZIO monitor.  You must apply for the Patient Assistance Program to qualify for this discounted rate.  To apply, please call [employer] at [phone], select option 4, select option 2, ask to apply for  Patient Assistance Program. Meredeth Ide will ask your household income, and how many people  are in your household. They will quote your out-of-pocket cost based on that information.  [employer] will also be able to set up a 16-month, interest-free payment plan if needed.  Applying the monitor   Shave hair from upper left chest.  Hold abrader disc by orange tab. Rub abrader in 40 strokes over the upper left chest as  indicated in your  monitor instructions.  Clean area with 4 enclosed alcohol pads. Let dry.  Apply patch as indicated in monitor instructions. Patch will be placed under collarbone on left  side of chest with arrow pointing upward.  Rub patch adhesive wings for 2 minutes. Remove white label marked "1". Remove the white  label marked "2". Rub patch adhesive wings for 2 additional minutes.  While looking in a mirror, press and release button in center of patch. A small green light will  flash 3-4 times. This will be your only indicator that the monitor has been turned on.  Do not shower for the first 24 hours. You may shower after the first 24 hours.  Press the button if you feel a symptom. You will hear a small click. Record Date, Time and  Symptom in the Patient Logbook.  When you are ready to remove the patch, follow instructions on the last 2 pages of Patient  Logbook. Stick patch monitor onto the last page of Patient Logbook.  Place Patient Logbook in the blue and white box. Use locking tab on box and tape box closed  securely. The blue and white box has prepaid postage on it. Please place it in the mailbox as  soon as possible. Your physician should have your test results approximately 7 days after the  monitor has been mailed back to [facility].  Call [facility] Customer Care at [phone] if you have questions regarding  your ZIO XT patch monitor. Call them immediately if you see an orange light blinking on your  monitor.  If your monitor falls off in less than 4 days, contact our Monitor department at [phone].  If your monitor becomes loose or falls off after 4 days call [employer] at [phone] for  suggestions on securing your monitor

## 2023-02-28 ENCOUNTER — Telehealth: Payer: Self-pay | Admitting: Internal Medicine

## 2023-02-28 LAB — MAGNESIUM: Magnesium: 2.3 mg/dL (ref 1.6–2.3)

## 2023-02-28 LAB — BASIC METABOLIC PANEL
BUN/Creatinine Ratio: 15 (ref 10–24)
BUN: 15 mg/dL (ref 8–27)
CO2: 25 mmol/L (ref 20–29)
Calcium: 9.3 mg/dL (ref 8.6–10.2)
Chloride: 104 mmol/L (ref 96–106)
Creatinine, Ser: 0.99 mg/dL (ref 0.76–1.27)
Glucose: 79 mg/dL (ref 70–99)
Potassium: 4.4 mmol/L (ref 3.5–5.2)
Sodium: 143 mmol/L (ref 134–144)
eGFR: 76 mL/min/{1.73_m2} (ref >59)

## 2023-02-28 NOTE — Telephone Encounter (Signed)
Sherri states pt's zio monitor is blinking and orange light. She states they called customer service but its still blinking, please advise.

## 2023-02-28 NOTE — Telephone Encounter (Signed)
Spoke with Lavonna Rua at [facility].  Patients ZIO XT blinking orange intermittently. I will have [employer] send a new device to  [facility] in care of Taden Witter [address] [place], [place] [address] Attn: Rosann Auerbach, unit manager  Facility to mail back both monitors separately.  [employer] will combine the report.

## 2023-02-28 NOTE — Telephone Encounter (Signed)
Office called requesting a callback in regards to recommendation notes from last office she'd like to get some clarity on. Please advise

## 2023-02-28 NOTE — Telephone Encounter (Signed)
patient's wife is returning call about pt's zio monitor is blinking and orange light.

## 2023-02-28 NOTE — Telephone Encounter (Signed)
Spoke with rehab staff who wish to clarify order of 3 day ZIO monitor from 04/30 OV.  Advised monitor will be received in the mail and applied according to instructions sent with pt yesterday after OV with Jari Favre.  Staff verbalize understanding and thanked RN for the callback.

## 2023-03-30 ENCOUNTER — Ambulatory Visit (HOSPITAL_COMMUNITY): Payer: Medicare Other | Attending: Physician Assistant

## 2023-03-30 DIAGNOSIS — I5033 Acute on chronic diastolic (congestive) heart failure: Secondary | ICD-10-CM

## 2023-03-30 LAB — ECHOCARDIOGRAM COMPLETE: S' Lateral: 4.4 cm

## 2023-03-30 MED ORDER — PERFLUTREN LIPID MICROSPHERE
1.0000 mL | INTRAVENOUS | Status: AC | PRN
  Administered 2023-03-30: 2 mL via INTRAVENOUS

## 2023-03-31 ENCOUNTER — Telehealth: Payer: Self-pay | Admitting: Internal Medicine

## 2023-03-31 ENCOUNTER — Other Ambulatory Visit: Payer: Self-pay | Admitting: *Deleted

## 2023-03-31 DIAGNOSIS — R001 Bradycardia, unspecified: Secondary | ICD-10-CM

## 2023-03-31 NOTE — Telephone Encounter (Signed)
Patient is returning call. Requesting return call.  

## 2023-03-31 NOTE — Telephone Encounter (Signed)
See result note.  

## 2023-05-09 ENCOUNTER — Encounter: Payer: Self-pay | Admitting: Cardiovascular Disease

## 2023-05-09 ENCOUNTER — Ambulatory Visit: Payer: Medicare Other | Attending: Cardiovascular Disease | Admitting: Cardiovascular Disease

## 2023-05-09 VITALS — BP 130/70 | HR 89 | Ht 73.0 in

## 2023-05-09 DIAGNOSIS — I440 Atrioventricular block, first degree: Secondary | ICD-10-CM | POA: Insufficient documentation

## 2023-05-09 DIAGNOSIS — I493 Ventricular premature depolarization: Secondary | ICD-10-CM | POA: Insufficient documentation

## 2023-05-09 DIAGNOSIS — R001 Bradycardia, unspecified: Secondary | ICD-10-CM | POA: Diagnosis present

## 2023-05-09 NOTE — Progress Notes (Signed)
  Electrophysiology Office Note:    Date:  05/09/2023   ID:  Tommy Bauer, DOB [birth year], MRN [id number]  PCP:  Eloisa Northern, MD   [facility] Providers Cardiologist:  Christell Constant, MD     Referring MD: Sharlene Dory, PA-C   History of Present Illness:    Tommy Bauer is a 82 y.o. male with a medical history significant for hypertension, hyperlipidemia, stroke, progressive dementia referred for arrhythmia management.     He is referred for evaluation management of frequent PVCs and bradycardia with evidence of heart block.  He was noted at this facility to have decreased pulse rate.  He has progressive dementia, but has reasonable quality of life and ambulates around his retirement facility.      The patient is present today with his wife.  He lives in [place] [facility].  The wife reports that his updates from the staff have consistently stated that he is doing well, he gets around the facility with a [place] and has not had any significant problems or complaints.  The patient tells me that he has not had any lightheadedness, dizziness, feeling like he is passing out, or palpitations.   EKGs/Labs/Other Studies Reviewed Today:    Echocardiogram:  TTE May 2024 EF 50 to 55% normal structure and function.   Monitors:  6 day Zio 03/2023 - my interpretation Heart rate 24 to 174 bpm, average 73. Episodes of Herbie Saxon were noted there were frequent PVCs, approximately 25%  Stress testing:   Advanced imaging:   Cardiac catherization   EKG:   EKG Interpretation Date/Time:  Tuesday May 09 2023 10:40:04 EDT Ventricular Rate:  89 PR Interval:    QRS Duration:  106 QT Interval:  488 QTC Calculation: 593 R Axis:   50  Text Interpretation: Sinus rhythm with first degree AV block with frequent Premature ventricular complexes When compared with ECG of 19-Jun-2022 02:24, First degree AV block has replaced second degree AV block PVCs present Confirmed by  York Pellant [phone]) on 05/09/2023 11:12:49 AM     Physical Exam:    VS:  BP 130/70   Pulse 89   Ht 6\' 1"  (1.854 m)   SpO2 96%   BMI 22.43 kg/m     Wt Readings from Last 3 Encounters:  02/27/23 170 lb (77.1 kg)  06/28/22 202 lb 2.6 oz (91.7 kg)  09/20/21 220 lb (99.8 kg)     GEN: Well nourished, well developed in no acute distress CARDIAC: RRR, no murmurs, rubs, gallops RESPIRATORY:  Normal work of breathing MUSCULOSKELETAL: no edema    ASSESSMENT & PLAN:    Second Degree Type I AV block (Wenckebach) In the absence of symptoms -- such as lightheadedness, dizziness, syncope, presyncope, paroxysmal fatigue --no intervention is needed  Frequent PVCs In the absence of symptoms or decreased ejection fraction, intervention not indicated. Avoid rate slowing medications due to history of type I AV block  Dementia Would avoid unnecessary medical therapy or invasive procedures unless it would significantly improve the patient's quality of life     Signed, Maurice Small, MD  05/09/2023 11:28 AM    [facility]

## 2023-05-09 NOTE — Patient Instructions (Signed)
Medication Instructions:  Your physician recommends that you continue on your current medications as directed. Please refer to the Current Medication list given to you today. *If you need a refill on your cardiac medications before your next appointment, please call your pharmacy*   Follow-Up: At [facility], you and your health needs are our priority.  As part of our continuing mission to provide you with exceptional heart care, we have created designated Provider Care Teams.  These Care Teams include your primary Cardiologist (physician) and Advanced Practice Providers (APPs -  Physician Assistants and Nurse Practitioners) who all work together to provide you with the care you need, when you need it.  We recommend signing up for the patient portal called "MyChart".  Sign up information is provided on this After Visit Summary.  MyChart is used to connect with patients for Virtual Visits (Telemedicine).  Patients are able to view lab/test results, encounter notes, upcoming appointments, etc.  Non-urgent messages can be sent to your provider as well.   To learn more about what you can do with MyChart, go to [email or web address].    Your next appointment:   Follow up with Jari Favre PA as instructed  Provider:   Jari Favre, PA-C

## 2023-07-21 ENCOUNTER — Encounter (HOSPITAL_COMMUNITY): Payer: Self-pay

## 2023-07-21 ENCOUNTER — Emergency Department (HOSPITAL_COMMUNITY)
Admission: EM | Admit: 2023-07-21 | Discharge: 2023-07-21 | Disposition: A | Discharge status: Skilled Nursing Facility | Payer: Medicare Other | Attending: Emergency Medicine | Admitting: Emergency Medicine

## 2023-07-21 ENCOUNTER — Other Ambulatory Visit: Payer: Self-pay

## 2023-07-21 DIAGNOSIS — I10 Essential (primary) hypertension: Secondary | ICD-10-CM | POA: Diagnosis not present

## 2023-07-21 DIAGNOSIS — Z7982 Long term (current) use of aspirin: Secondary | ICD-10-CM | POA: Diagnosis not present

## 2023-07-21 DIAGNOSIS — F0390 Unspecified dementia without behavioral disturbance: Secondary | ICD-10-CM | POA: Diagnosis not present

## 2023-07-21 DIAGNOSIS — L0291 Cutaneous abscess, unspecified: Secondary | ICD-10-CM | POA: Diagnosis present

## 2023-07-21 DIAGNOSIS — Z8546 Personal history of malignant neoplasm of prostate: Secondary | ICD-10-CM | POA: Diagnosis not present

## 2023-07-21 DIAGNOSIS — K611 Rectal abscess: Secondary | ICD-10-CM | POA: Insufficient documentation

## 2023-07-21 MED ORDER — CEPHALEXIN 500 MG PO CAPS: 500.0000 mg | ORAL_CAPSULE | Freq: Four times a day (QID) | ORAL | 0 refills | Status: AC

## 2023-07-21 MED ORDER — SULFAMETHOXAZOLE-TRIMETHOPRIM 800-160 MG PO TABS: 1.0000 | ORAL_TABLET | Freq: Two times a day (BID) | ORAL | 0 refills | Status: AC

## 2023-07-21 MED ORDER — OXYCODONE-ACETAMINOPHEN 5-325 MG PO TABS
1.0000 | ORAL_TABLET | Freq: Four times a day (QID) | ORAL | 0 refills | Status: AC | PRN
Start: 2023-07-21 — End: ?

## 2023-07-21 NOTE — ED Provider Notes (Signed)
[facility]PART[facility] AT [facility] Provider Note   CSN: [id number] Arrival date & time: 07/21/23  1313     History  Chief Complaint  Patient presents with   Wound Check    Tommy Bauer is a 82 y.o. male with a past medical history of hypertension, dementia, prostate adenocarcinoma, CVA presents today for evaluation of an abscess.  Patient has a wound on his buttocks.  The wound care nurse at his facility reported was draining pus and advised him to come to the ER for assessment.  No fever, nausea or vomiting.   Wound Check    Past Medical History:  Diagnosis Date   ADENOCARCINOMA, PROSTATE 05/19/2010   BACK PAIN 07/16/2008   Carpal tunnel syndrome 10/15/2008   COLONIC POLYPS, HX OF 06/08/2007   Contusion    left shoulder,left knee,s/p recent fall end of march 2013   CVA (cerebral vascular accident) [facility])    ERECTILE DYSFUNCTION, ORGANIC 07/11/2007   Headache(784.0) 07/11/2007   HYPERTENSION 06/08/2007   INGUINAL HERNIA 12/04/2007   LEFT VENTRICULAR FUNCTION, DECREASED 11/12/2007   Prostate cancer (HCC) 09/14/2009   prostateectomy,adenocarcinoma,gleason=4+3=7   PROSTATE SPECIFIC ANTIGEN, ELEVATED 05/06/2009   TOBACCO ABUSE 11/06/2009   Past Surgical History:  Procedure Laterality Date   HERNIA REPAIR     right ingunial   PROSTATE SURGERY  09/14/2009   radical prostatectomy /gleason=4+3-7     Home Medications Prior to Admission medications   Medication Sig Start Date End Date Taking? Authorizing Provider  cephALEXin (KEFLEX) 500 MG capsule Take 1 capsule (500 mg total) by mouth 4 (four) times daily. 07/21/23  Yes Jeanelle Malling, PA  oxyCODONE-acetaminophen (PERCOCET/ROXICET) 5-325 MG tablet Take 1 tablet by mouth every 6 (six) hours as needed for severe pain. 07/21/23  Yes Jeanelle Malling, PA  sulfamethoxazole-trimethoprim (BACTRIM DS) 800-160 MG tablet Take 1 tablet by mouth 2 (two) times daily for 7 days. 07/21/23 07/28/23 Yes Jeanelle Malling, PA  acetaminophen (TYLENOL) 500 MG  tablet Take 500 mg by mouth every 12 (twelve) hours.    [provider]  albuterol (PROVENTIL) (2.5 MG/3ML) 0.083% nebulizer solution Take 3 mLs (2.5 mg total) by nebulization every 2 (two) hours as needed for wheezing or shortness of breath. 06/27/22   Elgergawy, Leana Roe, MD  aspirin 81 MG tablet Take 1 tablet (81 mg total) by mouth daily. 07/15/15   Gordy Savers, MD  atorvastatin (LIPITOR) 40 MG tablet Take 1 tablet (40 mg total) by mouth daily. Patient taking differently: Take 40 mg by mouth every evening. 07/30/19   Nafziger, Kandee Keen, NP  empagliflozin (JARDIANCE) 10 MG TABS tablet Take 1 tablet (10 mg total) by mouth daily. 06/28/22   Elgergawy, Leana Roe, MD  folic acid (FOLVITE) 1 MG tablet Take 1 mg by mouth daily.    [provider]  furosemide (LASIX) 40 MG tablet Take 40 mg by mouth daily.    [provider]  gabapentin (NEURONTIN) 100 MG capsule Take 100 mg by mouth 3 (three) times daily. 02/17/23   [provider]  latanoprost (XALATAN) 0.005 % ophthalmic solution Place 1 drop into both eyes at bedtime. 05/22/22   [provider]  pantoprazole (PROTONIX) 40 MG tablet Take 1 tablet (40 mg total) by mouth daily. 06/28/22   Elgergawy, Leana Roe, MD  potassium chloride SA (KLOR-CON M) 20 MEQ tablet Take 1 tablet (20 mEq total) by mouth 2 (two) times daily. 06/27/22   Elgergawy, Leana Roe, MD  sacubitril-valsartan (ENTRESTO) 24-26 MG Take  1 tablet by mouth 2 (two) times daily. 06/27/22   Elgergawy, Leana Roe, MD  sertraline (ZOLOFT) 50 MG tablet Take 50 mg by mouth daily. 02/15/23   [provider]  Simethicone 125 MG CAPS Take 125 mg by mouth in the morning, at noon, and at bedtime.    [provider]  spironolactone (ALDACTONE) 25 MG tablet Take 0.5 tablets (12.5 mg total) by mouth daily. 06/28/22   Elgergawy, Leana Roe, MD  thiamine 100 MG tablet Take 100 mg by mouth daily.    [provider]      Allergies    Patient has no  known allergies.    Review of Systems   Review of Systems Negative except as per HPI.  Physical Exam Updated Vital Signs BP 117/66 (BP Location: Right Arm)   Pulse 68   Temp 98.6 F (37 C)   Resp 18   Ht 6\' 1"  (1.854 m)   Wt 77.1 kg   SpO2 96%   BMI 22.43 kg/m  Physical Exam Vitals and nursing note reviewed.  Constitutional:      Appearance: Normal appearance.  HENT:     Head: Normocephalic and atraumatic.     Mouth/Throat:     Mouth: Mucous membranes are moist.  Eyes:     General: No scleral icterus. Cardiovascular:     Rate and Rhythm: Normal rate and regular rhythm.     Pulses: Normal pulses.     Heart sounds: Normal heart sounds.  Pulmonary:     Effort: Pulmonary effort is normal.     Breath sounds: Normal breath sounds.  Abdominal:     General: Abdomen is flat.     Palpations: Abdomen is soft.     Tenderness: There is no abdominal tenderness.  Genitourinary:    Comments: 2 x 2 cm abscess near the rectum with an opening and active drainage. Musculoskeletal:        General: No deformity.  Skin:    General: Skin is warm.     Findings: No rash.  Neurological:     General: No focal deficit present.     Mental Status: He is alert.  Psychiatric:        Mood and Affect: Mood normal.     ED Results / Procedures / Treatments   Labs (all labs ordered are listed, but only abnormal results are displayed) Labs Reviewed - No data to display  EKG None  Radiology No results found.  Procedures .Marland KitchenIncision and Drainage  Date/Time: 07/21/2023 8:54 PM  Performed by: Jeanelle Malling, PA Authorized by: Jeanelle Malling, PA   Consent:    Consent obtained:  Verbal   Consent given by:  Patient   Risks discussed:  Bleeding, incomplete drainage, pain and damage to other organs   Alternatives discussed:  No treatment Universal protocol:    Procedure explained and questions answered to patient or proxy's satisfaction: yes     Relevant documents present and verified: yes      Patient identity confirmed:  Verbally with patient Location:    Type:  Abscess   Size:  2 x 2   Location:  Anogenital   Anogenital location:  Perirectal Procedure type:    Complexity:  Simple Procedure details:    Wound management:  Extensive cleaning   Drainage:  Purulent   Drainage amount:  Moderate   Wound treatment:  Wound left open Post-procedure details:    Procedure completion:  Tolerated well, no immediate complications  Medications Ordered in ED Medications - No data to display  ED Course/ Medical Decision Making/ A&P                                 Medical Decision Making Risk Prescription drug management.   This patient presents to the ED for wound check, this involves an extensive number of treatment options, and is a complaint that carries with a high risk of complications and morbidity.  The differential diagnosis includes abscess, pilonidal cyst, pressure ulcer, cellulitis, sepsis.  This is not an exhaustive list.  Problem list/ ED course/ Critical interventions/ Medical management: HPI: See above Vital signs within normal range and stable throughout visit. Laboratory/imaging studies significant for: See above. On physical examination, patient is afebrile and appears in no acute distress.  There is a 2 x 2 cm abscess near the rectum with opening and active drainage.  Since the abscess has an opening and is actively draining, I continue to use pressure to drain, more purulence was expressed.  Wound care dressing then performed by nursing staff. Low concerns for Fournier's gangrene.  There is no bullae, pain out of proportion or rapid progression concerning for necrotizing fasciitis.  Patient is not septic appearing.  Vital signs are normal.  Patient to be discharged home with Bactrim and Keflex with follow-up with his PCP and wound care clinic. I have reviewed the patient home medicines and have made adjustments as needed.  Cardiac monitoring/EKG: The patient  was maintained on a cardiac monitor.  I personally reviewed and interpreted the cardiac monitor which showed an underlying rhythm of: sinus rhythm.  Additional history obtained: External records from outside source obtained and reviewed including: Chart review including previous notes, labs, imaging.  Consultations obtained:  Disposition Continued outpatient therapy. Follow-up with PCP recommended for reevaluation of symptoms. Treatment plan discussed with patient.  Pt acknowledged understanding was agreeable to the plan. Worrisome signs and symptoms were discussed with patient, and patient acknowledged understanding to return to the ED if they noticed these signs and symptoms. Patient was stable upon discharge.   This chart was dictated using voice recognition software.  Despite best efforts to proofread,  errors can occur which can change the documentation meaning.          Final Clinical Impression(s) / ED Diagnoses Final diagnoses:  Abscess    Rx / DC Orders ED Discharge Orders          Ordered    cephALEXin (KEFLEX) 500 MG capsule  4 times daily        07/21/23 1502    sulfamethoxazole-trimethoprim (BACTRIM DS) 800-160 MG tablet  2 times daily        07/21/23 1502    oxyCODONE-acetaminophen (PERCOCET/ROXICET) 5-325 MG tablet  Every 6 hours PRN        07/21/23 1517              Jeanelle Malling, PA 07/21/23 2056    Elayne Snare K, DO 07/22/23 [phone]

## 2023-07-21 NOTE — Discharge Instructions (Addendum)
Please take your antibiotics as prescribed.  Take tylenol/ibuprofen/naproxen for pain. I recommend close follow-up with PCP and wound clinic for reevaluation.  Please do not hesitate to return to emergency department if worrisome signs symptoms we discussed become apparent.

## 2023-07-21 NOTE — ED Triage Notes (Signed)
Per EMS and ex-wife, the pt has a wound on his buttock. The wound care nurse at his facility reports it was draining pus and he needed to come to ED for assessment and possible lancing.

## 2023-07-21 NOTE — ED Notes (Signed)
PTAR arranged for patient.  ETA is within the hour.

## 2023-09-13 ENCOUNTER — Encounter: Payer: Self-pay | Admitting: Internal Medicine

## 2023-09-13 ENCOUNTER — Ambulatory Visit: Payer: Medicare Other | Attending: Internal Medicine | Admitting: Internal Medicine

## 2023-09-13 VITALS — BP 132/80 | HR 92 | Ht 73.0 in | Wt 181.0 lb

## 2023-09-13 DIAGNOSIS — N189 Chronic kidney disease, unspecified: Secondary | ICD-10-CM

## 2023-09-13 DIAGNOSIS — I441 Atrioventricular block, second degree: Secondary | ICD-10-CM | POA: Diagnosis not present

## 2023-09-13 DIAGNOSIS — Z8673 Personal history of transient ischemic attack (TIA), and cerebral infarction without residual deficits: Secondary | ICD-10-CM

## 2023-09-13 DIAGNOSIS — E785 Hyperlipidemia, unspecified: Secondary | ICD-10-CM | POA: Diagnosis not present

## 2023-09-13 DIAGNOSIS — I679 Cerebrovascular disease, unspecified: Secondary | ICD-10-CM

## 2023-09-13 DIAGNOSIS — F0390 Unspecified dementia without behavioral disturbance: Secondary | ICD-10-CM

## 2023-09-13 DIAGNOSIS — I10 Essential (primary) hypertension: Secondary | ICD-10-CM

## 2023-09-13 DIAGNOSIS — I5032 Chronic diastolic (congestive) heart failure: Secondary | ICD-10-CM | POA: Diagnosis not present

## 2023-09-13 DIAGNOSIS — R001 Bradycardia, unspecified: Secondary | ICD-10-CM

## 2023-09-13 NOTE — Patient Instructions (Addendum)
Medication Instructions:  Your physician recommends that you continue on your current medications as directed. Please refer to the Current Medication list given to you today.  *If you need a refill on your cardiac medications before your next appointment, please call your pharmacy*   Lab Work: BMP, BNP  If you have labs (blood work) drawn today and your tests are completely normal, you will receive your results only by: MyChart Message (if you have MyChart) OR A paper copy in the mail If you have any lab test that is abnormal or we need to change your treatment, we will call you to review the results.   Testing/Procedures: NONE   Follow-Up: At [facility], you and your health needs are our priority.  As part of our continuing mission to provide you with exceptional heart care, we have created designated Provider Care Teams.  These Care Teams include your primary Cardiologist (physician) and Advanced Practice Providers (APPs -  Physician Assistants and Nurse Practitioners) who all work together to provide you with the care you need, when you need it.  We recommend signing up for the patient portal called "MyChart".  Sign up information is provided on this After Visit Summary.  MyChart is used to connect with patients for Virtual Visits (Telemedicine).  Patients are able to view lab/test results, encounter notes, upcoming appointments, etc.  Non-urgent messages can be sent to your provider as well.   To learn more about what you can do with MyChart, go to [email or web address].    Your next appointment:   6 month(s)  Provider:   Jari Favre, PA-C     Then, Christell Constant, MD will plan to see you again in 1 year(s).    Other Instructions  Summary for [facility]   VISIT SUMMARY:  Tommy Bauer visited for a routine follow-up. He is managing his heart failure well with no current symptoms and is active within your facility. His medications are  effectively controlling his conditions, and he continues to receive routine podiatry and eye care. Discussions about a more conservative approach to his care were held, and his ex-wife, who is his healthcare power of attorney, is in agreement.  He has no LE edema.  He is asymptomatic.  YOUR PLAN:  -HEART FAILURE: Heart failure means the heart is not pumping blood as well as it should. Your heart function is stable, and you have no symptoms of fluid overload. Continue taking spironolactone 12.5 mg, Lasix 40 mg, and Jardiance 10 mg daily. We will monitor your potassium and kidney function with blood tests. Follow up with Jari Favre PA-C in 6 months and  me, Dr. Izora Ribas,  in 1 year.  -CHRONIC KIDNEY DISEASE STAGE 3A: Chronic kidney disease stage 3A means your kidneys are moderately damaged. We will monitor your kidney function with a blood test to check your creatinine levels.  -HYPOKALEMIA: Hypokalemia means low potassium levels in the blood. Your potassium levels are well-controlled with your current medication. We will continue to monitor your potassium levels with a blood test.  -HYPERLIPIDEMIA: Hyperlipidemia means high levels of fats (lipids) in the blood. Continue taking Lipitor 40 mg daily to manage your cholesterol levels.  -DEMENTIA: Dementia is a decline in cognitive function. You are staying active with physical and occupational therapy. We discussed the potential benefits of a more conservative code plan. Continue your current activity and therapy regimen and discuss advanced directives with your family and primary care physician.   -GENERAL HEALTH MAINTENANCE:  You are receiving routine care from a podiatrist and eye care specialist. Continue with this routine care to maintain your overall health.  INSTRUCTIONS:  Follow up with Tessa in 6 months and with your cardiologist in 1 year.

## 2023-09-13 NOTE — Progress Notes (Signed)
Cardiology Office Note:  .    Date:  09/13/2023  ID:  [name], DOB [birth year], MRN [id number] PCP: Eloisa Northern, MD  [facility] Providers Cardiologist:  Christell Constant, MD     CC: Transition to new cardiologist.  History of Present Illness: .    Tommy Bauer is a 82 y.o. male with dementia and heart failure.  He lives in [place], was previously seen by Dr. Dominga Ferry.  He has had heart block and PVCs and is managed by Dr. Nelly Laurence, EP.  Discussed the use of AI scribe software for clinical note transcription with the patient, who gave verbal consent to proceed.  Mr. Massari, a patient with a history of congestive heart failure, hyperlipidemia, and hypokalemia, presents for a routine follow-up. The patient's current medications include spironolactone 12.5mg , Lasix 40mg , Lipitor 40mg , aspirin 81mg , Jardiance 10mg , and potassium 20mg  daily. The patient's potassium levels have been well-managed, although recent follow-up testing is not available in our records.  The patient resides in a healthcare facility where he receives routine podiatry and eye care. He is reported to be doing well, with no recent episodes of fluid overload. The patient had an echocardiogram and another unspecified cardiac test in the recent past, the results of which were reportedly satisfactory.  He does PT and recently started doing OT per his ex-wife who presents with him today.  The patient's heart function was reported to be satisfactory in an echocardiogram conducted in 2024. He also saw a heart rhythm specialist, Dr. Nelly Laurence, who advised against the placement of a pacemaker given the patient's stable heart function and absence of symptomatic arrhythmias.  The patient denies any current swelling in his legs. He is reported to be active within his facility, participating in physical and occupational therapy. He notes that he does not have dementia, and he is a Music therapist.  The patient's ex-wife, who is also his  healthcare power of attorney, accompanies him to appointments. She reports that the patient is kept active at the facility to maintain his cognitive function. The patient's cognitive function is reported to be declining, although he is not currently on any medications for this.  The patient's healthcare power of attorney and the patient have been advised to consider a more conservative code plan given the patient's age and health status. The patient's ex-wife is reported to be in agreement with this suggestion, noting an anecdote about a family friend who did not have this done.  Relevant histories: .  Social- has three children and an ex wife who is his HPOA ROS: As per HPI.   Studies Reviewed: .   Cardiac Studies & Procedures       ECHOCARDIOGRAM  ECHOCARDIOGRAM COMPLETE 03/30/2023  Narrative ECHOCARDIOGRAM REPORT    Patient Name:   Tommy Bauer   Date of Exam: 03/30/2023 Medical Rec #:  [id number]     Height:       73.0 in Accession #:    [id number]    Weight:       170.0 lb Date of Birth:  [birth year]     BSA:          2.008 m Patient Age:    82 years      BP:           138/68 mmHg Patient Gender: M             HR:           75 bpm. Exam Location:  [facility]  Procedure: 2D Echo, Cardiac Doppler, Color Doppler and Intracardiac Opacification Agent  Indications:    CHF I50.9  History:        Patient has prior history of Echocardiogram examinations, most recent 06/19/2022. Risk Factors:Hypertension.  Sonographer:    Travis [place] Referring Phys: 85 TESSA N CONTE   Sonographer Comments: Technically challenging study due to limited acoustic windows and Technically difficult study due to poor echo windows. IMPRESSIONS   1. There is significant beat to beat variability due to atrial fibrillation. 2. Left ventricular ejection fraction, by estimation, is 50 to 55%. The left ventricle has low normal function. The left ventricle has no regional wall motion abnormalities. Left  ventricular diastolic function could not be evaluated. 3. Right ventricular systolic function is normal. The right ventricular size is normal. 4. The mitral valve is normal in structure. No evidence of mitral valve regurgitation. No evidence of mitral stenosis. 5. The aortic valve was not well visualized. There is mild calcification of the aortic valve. Aortic valve regurgitation is not visualized. No aortic stenosis is present. 6. The inferior vena cava is normal in size with greater than 50% respiratory variability, suggesting right atrial pressure of 3 mmHg.  FINDINGS Left Ventricle: Left ventricular ejection fraction, by estimation, is 50 to 55%. The left ventricle has low normal function. The left ventricle has no regional wall motion abnormalities. Definity contrast agent was given IV to delineate the left ventricular endocardial borders. The left ventricular internal cavity size was normal in size. There is no left ventricular hypertrophy. Left ventricular diastolic function could not be evaluated due to atrial fibrillation. Left ventricular diastolic function could not be evaluated.  Right Ventricle: The right ventricular size is normal. No increase in right ventricular wall thickness. Right ventricular systolic function is normal.  Left Atrium: Left atrial size was normal in size.  Right Atrium: Right atrial size was normal in size.  Pericardium: There is no evidence of pericardial effusion.  Mitral Valve: The mitral valve is normal in structure. No evidence of mitral valve regurgitation. No evidence of mitral valve stenosis.  Tricuspid Valve: The tricuspid valve is normal in structure. Tricuspid valve regurgitation is trivial. No evidence of tricuspid stenosis.  Aortic Valve: The aortic valve was not well visualized. There is mild calcification of the aortic valve. Aortic valve regurgitation is not visualized. No aortic stenosis is present.  Pulmonic Valve: The pulmonic valve was  not well visualized. Pulmonic valve regurgitation is not visualized. No evidence of pulmonic stenosis.  Aorta: The aortic root is normal in size and structure.  Venous: The inferior vena cava is normal in size with greater than 50% respiratory variability, suggesting right atrial pressure of 3 mmHg.  IAS/Shunts: No atrial level shunt detected by color flow Doppler.   LEFT VENTRICLE PLAX 2D LVIDd:         4.90 cm   Diastology LVIDs:         4.40 cm   LV e' medial: 9.79 cm/s LV PW:         1.10 cm LV IVS:        1.00 cm LVOT diam:     2.70 cm LV SV:         84 LV SV Index:   42 LVOT Area:     5.73 cm   RIGHT VENTRICLE RV Basal diam:  3.70 cm RV Mid diam:    2.10 cm RV S prime:     6.74 cm/s TAPSE (M-mode): 1.9 cm  LEFT  ATRIUM             Index        RIGHT ATRIUM           Index LA diam:        3.10 cm 1.54 cm/m   RA Area:     23.50 cm LA Vol (A2C):   58.2 ml 28.98 ml/m  RA Volume:   77.30 ml  38.50 ml/m LA Vol (A4C):   46.4 ml 23.11 ml/m LA Biplane Vol: 52.6 ml 26.19 ml/m AORTIC VALVE LVOT Vmax:   71.80 cm/s LVOT Vmean:  45.600 cm/s LVOT VTI:    0.147 m  AORTA Ao Root diam: 3.20 cm Ao Asc diam:  3.60 cm   SHUNTS Systemic VTI:  0.15 m Systemic Diam: 2.70 cm  Aditya Sabharwal Electronically signed by Dorthula Nettles Signature Date/Time: 03/30/2023/1:57:46 PM    Final    MONITORS  LONG TERM MONITOR (3-14 DAYS) 03/22/2023  Narrative   Two monitors studied.   Patient had a minimum heart rate of 28 bpm, maximum heart rate of 174 bpm, and average heart rate of 88 bpm.   Predominant underlying rhythm was sinus rhythm.   Frequent NSVT with longest run 13 beats.   Isolated PACs were rare (<1.0%).   Isolated PVCs were frequent (23%).   Wenckebach heart block noted on both studies   No symptoms triggered  Frequent NSVT and PVCS.            Physical Exam:    VS:  BP 132/80   Pulse 92   Ht 6\' 1"  (1.854 m)   Wt 181 lb (82.1 kg)   BMI 23.88 kg/m     Wt Readings from Last 3 Encounters:  09/13/23 181 lb (82.1 kg)  07/21/23 170 lb (77.1 kg)  02/27/23 170 lb (77.1 kg)    Gen: no distress  Neck: No JVD,  Cardiac: No Rubs or Gallops, no murmur, largely regular rhythm, +2 radial pulses Respiratory: Clear to auscultation bilaterally, normal effort, normal  respiratory rate GI: Soft, nontender, non-distended  MS: No  edema;  moves all extremities Integument: Skin feels warm Neuro:  At time of evaluation, alert and oriented to person   ASSESSMENT AND PLAN: .    Heart Failure- Chronic, recovered - with NSVT, and intermittent heart block - Recovered heart failure. No current symptoms of fluid overload, chest pain, or dyspnea. Managed with spironolactone, Lasix, and Jardiance. Previous echocardiogram in 2024 showed LV recovery. No new interventions recommended by Dr. Nelly Laurence Discussed risks and benefits of conservative management versus invasive procedures. Prefers to avoid invasive procedures unless necessary. - Continue spironolactone 12.5 mg daily, Lasix 40 mg daily, Jardiance 10 mg daily - Order BMP and BNP to monitor potassium and renal function  Chronic Kidney Disease Stage 3A - CKD stage 3A. Requires monitoring of renal function. - Order BMP to check creatinine levels  Hypokalemia Managed with potassium 20 mEq daily. Potassium levels well-controlled. - Order BMP to monitor potassium levels  Hyperlipidemia Managed with Lipitor 40 mg daily. Aggressive lipid control due to history of stroke.  Dementia - Progressive dementia. Remains active with physical and occupational therapy. Discussed potential benefits of a more conservative code plan (DNR/DNI) - Continue current activity and therapy regimen - Encourage discussion of advanced directives with family and primary care physician; no changes made at this time - Encourage continued routine care with podiatrist and eye care specialist  Goals of Care - Discussed importance of  conversation about  code status and advanced directives. Ex-wife is healthcare power of attorney. Prefers conservative approach to life-sustaining treatments, avoiding invasive procedures unless necessary. - Encourage discussion of code status and advanced directives with family and primary care physician  Follow-up - Follow up with Jari Favre PA-C in 6 months - Follow up with me in one year  Time Spent Directly with Patient:   I have spent a total of 41 minutes with the patient reviewing notes, imaging, EKGs, labs, and examining the patient as well as establishing an assessment and plan that was discussed personally with the patient. Discussed disease state education related to dementia, discussed outcomes with CPR in patients with his condition, and reviewed options for GOC. Reviewed care and plan in collaboration with [facility]   Riley Lam, MD FASE [facility] Cardiologist [facility]  [address] [place] [place], [place] [address] [phone]  11:09 AM

## 2023-09-14 LAB — BASIC METABOLIC PANEL
BUN/Creatinine Ratio: 17 (ref 10–24)
BUN: 18 mg/dL (ref 8–27)
CO2: 20 mmol/L (ref 20–29)
Calcium: 10.2 mg/dL (ref 8.6–10.2)
Chloride: 97 mmol/L (ref 96–106)
Creatinine, Ser: 1.08 mg/dL (ref 0.76–1.27)
Glucose: 86 mg/dL (ref 70–99)
Potassium: 4.7 mmol/L (ref 3.5–5.2)
Sodium: 144 mmol/L (ref 134–144)
eGFR: 69 mL/min/{1.73_m2} (ref >59)

## 2023-09-14 LAB — PRO B NATRIURETIC PEPTIDE: NT-Pro BNP: 472 pg/mL (ref 0–486)

## 2023-11-13 ENCOUNTER — Telehealth: Payer: Self-pay | Admitting: Internal Medicine

## 2023-11-13 NOTE — Telephone Encounter (Signed)
 STAT if HR is under 50 or over 120 (normal HR is 60-100 beats per minute)  What is your heart rate?  170/50 and hr was down in the 30's over the weekend   Do you have a log of your heart rate readings (document readings)? NA   Do you have any other symptoms? He is not reporting any other Symptoms    Pt is at [facility]  Lauren called in with [facility] and stated she is in to check on pat 2 times a week and stated this pt HR has  been running low over the weekend  Best number [phone] Need to call [facility] to make appt- [phone]

## 2023-11-13 NOTE — Telephone Encounter (Signed)
 Spoke with Lauren who reports that over the weekend the nurse that was in to see the patient called the provider to report blood pressure of 70/50 and heart rate in the 30s. She states that the patient did not report any symptoms. She is not sure whether the heart rate in the 30s was taken manually or with a BP cuff/pulse ox. She reports that the provider had the patient hold Entresto  and diuretics and increase oral fluid intake.  Lauren states that she saw the patient today and his heart rate was in the 50s. She took an apical pulse and also noted it to be irregular. This is his baseline. She states that he denied any symptoms although patient is somewhat of a poor historian.  Advised her to continue to monitor and make sure that they are checking his pulse manually. They will let us  know if his blood pressure continues to remain low.
